# Patient Record
Sex: Male | Born: 1935
Health system: Southern US, Community
[De-identification: ages and names within clinical notes are randomized; demographics above are authoritative.]

## PROBLEM LIST (undated history)

## (undated) DIAGNOSIS — M1990 Unspecified osteoarthritis, unspecified site: Secondary | ICD-10-CM

## (undated) DIAGNOSIS — J189 Pneumonia, unspecified organism: Secondary | ICD-10-CM

## (undated) DIAGNOSIS — F0390 Unspecified dementia without behavioral disturbance: Secondary | ICD-10-CM

## (undated) DIAGNOSIS — I639 Cerebral infarction, unspecified: Secondary | ICD-10-CM

## (undated) DIAGNOSIS — I499 Cardiac arrhythmia, unspecified: Secondary | ICD-10-CM

## (undated) DIAGNOSIS — R569 Unspecified convulsions: Secondary | ICD-10-CM

## (undated) DIAGNOSIS — I82409 Acute embolism and thrombosis of unspecified deep veins of unspecified lower extremity: Secondary | ICD-10-CM

## (undated) DIAGNOSIS — I2510 Atherosclerotic heart disease of native coronary artery without angina pectoris: Secondary | ICD-10-CM

## (undated) DIAGNOSIS — F1011 Alcohol abuse, in remission: Secondary | ICD-10-CM

## (undated) DIAGNOSIS — E7800 Pure hypercholesterolemia, unspecified: Secondary | ICD-10-CM

## (undated) DIAGNOSIS — A09 Infectious gastroenteritis and colitis, unspecified: Secondary | ICD-10-CM

## (undated) DIAGNOSIS — N390 Urinary tract infection, site not specified: Secondary | ICD-10-CM

## (undated) HISTORY — PX: OTHER SURGICAL HISTORY: SHX169

## (undated) HISTORY — DX: Urinary tract infection, site not specified: N39.0

## (undated) HISTORY — DX: Unspecified osteoarthritis, unspecified site: M19.90

## (undated) HISTORY — PX: ROTATOR CUFF REPAIR: SHX139

## (undated) HISTORY — DX: Atherosclerotic heart disease of native coronary artery without angina pectoris: I25.10

## (undated) HISTORY — PX: JOINT REPLACEMENT: SHX530

## (undated) HISTORY — DX: Alcohol abuse, in remission: F10.11

## (undated) HISTORY — DX: Cerebral infarction, unspecified: I63.9

---

## 2002-05-03 ENCOUNTER — Encounter: Payer: Self-pay | Admitting: Internal Medicine

## 2002-05-03 ENCOUNTER — Encounter: Admission: RE | Admit: 2002-05-03 | Discharge: 2002-05-03 | Payer: Self-pay | Admitting: Internal Medicine

## 2002-06-28 ENCOUNTER — Encounter: Payer: Self-pay | Admitting: Neurosurgery

## 2002-06-28 ENCOUNTER — Ambulatory Visit (HOSPITAL_COMMUNITY): Admission: RE | Admit: 2002-06-28 | Discharge: 2002-06-28 | Payer: Self-pay | Admitting: Internal Medicine

## 2002-06-28 ENCOUNTER — Encounter: Admission: RE | Admit: 2002-06-28 | Discharge: 2002-06-28 | Payer: Self-pay | Admitting: Neurosurgery

## 2002-07-12 ENCOUNTER — Encounter: Admission: RE | Admit: 2002-07-12 | Discharge: 2002-07-12 | Payer: Self-pay | Admitting: Neurosurgery

## 2002-07-12 ENCOUNTER — Encounter: Payer: Self-pay | Admitting: Neurosurgery

## 2002-07-12 ENCOUNTER — Ambulatory Visit (HOSPITAL_COMMUNITY): Admission: RE | Admit: 2002-07-12 | Discharge: 2002-07-12 | Payer: Self-pay | Admitting: Neurosurgery

## 2002-08-02 ENCOUNTER — Encounter: Payer: Self-pay | Admitting: Neurosurgery

## 2002-08-02 ENCOUNTER — Encounter: Admission: RE | Admit: 2002-08-02 | Discharge: 2002-08-02 | Payer: Self-pay | Admitting: Neurosurgery

## 2002-08-02 ENCOUNTER — Ambulatory Visit (HOSPITAL_COMMUNITY): Admission: RE | Admit: 2002-08-02 | Discharge: 2002-08-02 | Payer: Self-pay | Admitting: Neurosurgery

## 2003-08-22 ENCOUNTER — Inpatient Hospital Stay (HOSPITAL_BASED_OUTPATIENT_CLINIC_OR_DEPARTMENT_OTHER): Admission: RE | Admit: 2003-08-22 | Discharge: 2003-08-22 | Payer: Self-pay | Admitting: Cardiovascular Disease

## 2004-07-22 ENCOUNTER — Encounter: Admission: RE | Admit: 2004-07-22 | Discharge: 2004-07-22 | Payer: Self-pay | Admitting: Gastroenterology

## 2004-08-05 ENCOUNTER — Ambulatory Visit (HOSPITAL_COMMUNITY): Admission: RE | Admit: 2004-08-05 | Discharge: 2004-08-05 | Payer: Self-pay | Admitting: Gastroenterology

## 2004-08-05 ENCOUNTER — Encounter (INDEPENDENT_AMBULATORY_CARE_PROVIDER_SITE_OTHER): Payer: Self-pay | Admitting: *Deleted

## 2006-05-15 ENCOUNTER — Encounter: Admission: RE | Admit: 2006-05-15 | Discharge: 2006-05-15 | Payer: Self-pay | Admitting: Internal Medicine

## 2006-11-28 ENCOUNTER — Encounter: Admission: RE | Admit: 2006-11-28 | Discharge: 2006-11-28 | Payer: Self-pay | Admitting: Internal Medicine

## 2007-03-01 ENCOUNTER — Ambulatory Visit (HOSPITAL_COMMUNITY): Admission: RE | Admit: 2007-03-01 | Discharge: 2007-03-01 | Payer: Self-pay | Admitting: Nephrology

## 2007-03-23 ENCOUNTER — Observation Stay (HOSPITAL_COMMUNITY): Admission: RE | Admit: 2007-03-23 | Discharge: 2007-03-24 | Payer: Self-pay | Admitting: Nephrology

## 2007-03-23 ENCOUNTER — Encounter (INDEPENDENT_AMBULATORY_CARE_PROVIDER_SITE_OTHER): Payer: Self-pay | Admitting: Nephrology

## 2007-05-04 ENCOUNTER — Encounter: Admission: RE | Admit: 2007-05-04 | Discharge: 2007-05-04 | Payer: Self-pay | Admitting: Nephrology

## 2007-05-10 ENCOUNTER — Encounter: Admission: RE | Admit: 2007-05-10 | Discharge: 2007-05-10 | Payer: Self-pay | Admitting: Nephrology

## 2007-09-17 ENCOUNTER — Ambulatory Visit: Payer: Self-pay | Admitting: Gastroenterology

## 2007-10-29 ENCOUNTER — Telehealth (INDEPENDENT_AMBULATORY_CARE_PROVIDER_SITE_OTHER): Payer: Self-pay | Admitting: *Deleted

## 2007-10-30 ENCOUNTER — Encounter: Payer: Self-pay | Admitting: Gastroenterology

## 2007-10-30 ENCOUNTER — Ambulatory Visit: Payer: Self-pay | Admitting: Gastroenterology

## 2007-11-01 ENCOUNTER — Encounter: Payer: Self-pay | Admitting: Gastroenterology

## 2008-05-28 ENCOUNTER — Encounter: Admission: RE | Admit: 2008-05-28 | Discharge: 2008-05-28 | Payer: Self-pay | Admitting: Internal Medicine

## 2009-03-25 ENCOUNTER — Encounter: Admission: RE | Admit: 2009-03-25 | Discharge: 2009-03-25 | Payer: Self-pay | Admitting: Internal Medicine

## 2009-04-07 ENCOUNTER — Inpatient Hospital Stay (HOSPITAL_COMMUNITY): Admission: EM | Admit: 2009-04-07 | Discharge: 2009-04-11 | Payer: Self-pay | Admitting: Emergency Medicine

## 2009-04-08 ENCOUNTER — Encounter (INDEPENDENT_AMBULATORY_CARE_PROVIDER_SITE_OTHER): Payer: Self-pay | Admitting: *Deleted

## 2009-04-08 ENCOUNTER — Ambulatory Visit: Payer: Self-pay | Admitting: Vascular Surgery

## 2009-09-16 ENCOUNTER — Encounter: Admission: RE | Admit: 2009-09-16 | Discharge: 2009-09-16 | Payer: Self-pay | Admitting: Internal Medicine

## 2010-06-27 LAB — CBC
HCT: 38 % — ABNORMAL LOW (ref 39.0–52.0)
Hemoglobin: 12.8 g/dL — ABNORMAL LOW (ref 13.0–17.0)
MCHC: 33.8 g/dL (ref 30.0–36.0)
MCV: 90.9 fL (ref 78.0–100.0)
Platelets: 182 10*3/uL (ref 150–400)
RBC: 4.18 MIL/uL — ABNORMAL LOW (ref 4.22–5.81)
RDW: 13.8 % (ref 11.5–15.5)
WBC: 7.7 10*3/uL (ref 4.0–10.5)

## 2010-06-27 LAB — GLUCOSE, CAPILLARY: Glucose-Capillary: 103 mg/dL — ABNORMAL HIGH (ref 70–99)

## 2010-06-27 LAB — BASIC METABOLIC PANEL WITH GFR
BUN: 6 mg/dL (ref 6–23)
CO2: 26 meq/L (ref 19–32)
Calcium: 8.9 mg/dL (ref 8.4–10.5)
Chloride: 100 meq/L (ref 96–112)
Creatinine, Ser: 0.74 mg/dL (ref 0.4–1.5)
GFR calc non Af Amer: >60 mL/min
Glucose, Bld: 108 mg/dL — ABNORMAL HIGH (ref 70–99)
Potassium: 3 meq/L — ABNORMAL LOW (ref 3.5–5.1)
Sodium: 134 meq/L — ABNORMAL LOW (ref 135–145)

## 2010-07-12 LAB — COMPREHENSIVE METABOLIC PANEL
ALT: 24 U/L (ref 0–53)
AST: UNDETERMINED U/L (ref 0–37)
Albumin: 4.3 g/dL (ref 3.5–5.2)
Creatinine, Ser: 1.03 mg/dL (ref 0.4–1.5)
Glucose, Bld: UNDETERMINED mg/dL (ref 70–99)
Sodium: 132 mEq/L — ABNORMAL LOW (ref 135–145)
Total Bilirubin: UNDETERMINED mg/dL (ref 0.3–1.2)
Total Protein: 8.3 g/dL (ref 6.0–8.3)

## 2010-07-12 LAB — TESTOSTERONE: Testosterone: 54.39 ng/dL — ABNORMAL LOW (ref 350–890)

## 2010-07-12 LAB — GLUCOSE, CAPILLARY
Glucose-Capillary: 107 mg/dL — ABNORMAL HIGH (ref 70–99)
Glucose-Capillary: 109 mg/dL — ABNORMAL HIGH (ref 70–99)
Glucose-Capillary: 114 mg/dL — ABNORMAL HIGH (ref 70–99)
Glucose-Capillary: 67 mg/dL — ABNORMAL LOW (ref 70–99)
Glucose-Capillary: 69 mg/dL — ABNORMAL LOW (ref 70–99)
Glucose-Capillary: 76 mg/dL (ref 70–99)
Glucose-Capillary: 88 mg/dL (ref 70–99)
Glucose-Capillary: 89 mg/dL (ref 70–99)
Glucose-Capillary: 90 mg/dL (ref 70–99)
Glucose-Capillary: 92 mg/dL (ref 70–99)
Glucose-Capillary: 99 mg/dL (ref 70–99)

## 2010-07-12 LAB — CBC
HCT: 35.6 % — ABNORMAL LOW (ref 39.0–52.0)
Platelets: 162 10*3/uL (ref 150–400)
RDW: 14.2 % (ref 11.5–15.5)
WBC: 8.3 10*3/uL (ref 4.0–10.5)

## 2010-07-12 LAB — URINALYSIS, ROUTINE W REFLEX MICROSCOPIC
Bilirubin Urine: NEGATIVE
Ketones, ur: NEGATIVE mg/dL
Nitrite: NEGATIVE
Protein, ur: NEGATIVE mg/dL
Urobilinogen, UA: 0.2 mg/dL (ref 0.0–1.0)

## 2010-07-12 LAB — CK TOTAL AND CKMB (NOT AT ARMC)
CK, MB: 2.4 ng/mL (ref 0.3–4.0)
CK, MB: UNDETERMINED ng/mL (ref 0.3–4.0)
Relative Index: 1.9 (ref 0.0–2.5)
Relative Index: INVALID (ref 0.0–2.5)
Total CK: 124 U/L (ref 7–232)

## 2010-07-12 LAB — BASIC METABOLIC PANEL
BUN: 10 mg/dL (ref 6–23)
CO2: 23 mEq/L (ref 19–32)
Calcium: 8.9 mg/dL (ref 8.4–10.5)
Calcium: 8.9 mg/dL (ref 8.4–10.5)
Creatinine, Ser: 0.66 mg/dL (ref 0.4–1.5)
Creatinine, Ser: 0.82 mg/dL (ref 0.4–1.5)
GFR calc Af Amer: >60 mL/min (ref >60)
GFR calc non Af Amer: >60 mL/min (ref >60)
GFR calc non Af Amer: >60 mL/min (ref >60)
Glucose, Bld: 102 mg/dL — ABNORMAL HIGH (ref 70–99)
Glucose, Bld: 113 mg/dL — ABNORMAL HIGH (ref 70–99)
Potassium: 3.6 mEq/L (ref 3.5–5.1)

## 2010-07-12 LAB — URINE CULTURE

## 2010-07-12 LAB — POCT CARDIAC MARKERS
CKMB, poc: 1.5 ng/mL (ref 1.0–8.0)
Troponin i, poc: <0.05 ng/mL (ref 0.00–0.09)

## 2010-07-12 LAB — VITAMIN B12
Vitamin B-12: 233 pg/mL (ref 211–911)
Vitamin B-12: 264 pg/mL (ref 211–911)

## 2010-07-12 LAB — HEPATIC FUNCTION PANEL
Bilirubin, Direct: 0.2 mg/dL (ref 0.0–0.3)
Total Bilirubin: 0.3 mg/dL (ref 0.3–1.2)

## 2010-07-12 LAB — TSH: TSH: 1.709 u[IU]/mL (ref 0.350–4.500)

## 2010-07-12 LAB — GLUCOSE, RANDOM: Glucose, Bld: 92 mg/dL (ref 70–99)

## 2010-07-12 LAB — TESTOSTERONE, FREE: Testosterone, Free: 8.7 pg/mL — ABNORMAL LOW (ref 47.0–244.0)

## 2010-07-12 LAB — AST: AST: 26 U/L (ref 0–37)

## 2010-07-12 LAB — TESTOSTERONE, % FREE: Testosterone-% Free: 1.6 % — ABNORMAL HIGH (ref 1.6–2.9)

## 2010-07-12 LAB — HIV ANTIBODY (ROUTINE TESTING W REFLEX): HIV: NONREACTIVE

## 2010-07-12 LAB — PSA: PSA: 0.97 ng/mL (ref 0.10–4.00)

## 2010-08-27 NOTE — Cardiovascular Report (Signed)
Raymond Rangel                            ACCOUNT NO.:  [id number]   MEDICAL RECORD NO.:  [id number]                   PATIENT TYPE:  OIB   LOCATION:  6501                                 FACILITY:  [facility]   PHYSICIAN:  Vesta Mixer, M.D.              DATE OF BIRTH:  Dec 13, 1931   DATE OF PROCEDURE:  08/22/2003  DATE OF DISCHARGE:  08/22/2003                              CARDIAC CATHETERIZATION   PROCEDURES PERFORMED:   CARDIOLOGIST:  Vesta Mixer, M.D.   INDICATIONS FOR PROCEDURE:  Raymond Rangel is a 77 year old gentleman with a  history of an abnormal Cardiolite study in the past.  He was seen originally  two years ago and had an abnormal Cardiolite study.  He did not want to have  a heart catheterization at that time.  He did not return for follow up after  that, but returned about a month ago wanting to have further evaluation of  his coronary arteries.  We repeated the Cardiolite study, which revealed a  fixed inferior wall defect.  He was scheduled for a heart catheterization  for further evaluation.   DESCRIPTION OF PROCEDURE:  The right femoral artery was easily cannulated  using the modified Seldinger technique.   HEMODYNAMIC DATA:  The left ventricular pressure was 132/14 with an aortic  pressure of 131/80.   ANGIOGRAPHIC DATA:  Left Main:  The left main is moderately  calcified.  There is a 20-30% stenosis at the origin of the left main, but it does not  appear to be flow-limiting.   Left Anterior Descending Artery:  The left anterior descending artery is  mildly-to-moderately calcified. There are mild diffuse irregularities  throughout that course the left anterior descending artery, but no discrete  stenosis.  The first diagonal branch is a fairly small vessel and has a 60-  70% stenosis at its origin.   Circumflex Artery:  The left circumflex artery is mildly-to-moderately  calcified  in the proximal segment.  There are minor luminal irregularities  in  the circumflex vessel, but no discrete stenosis.   Right Coronary Artery:  The right coronary artery is somewhat of a moderate-  sized vessel.  There is a long severely diffused segment extending from the  proximal and all the way through the midvessel.  This stenosis ranges in  severity between 70 and 85%.  It is fairly long and very eccentric.  The RCA  then has mild diffuse irregularities between 20 and 30% extending all the  way to the distal RCA.  The posterior descending artery and the  posterolateral artery are free of significant disease.   VENTRICULOGRAPHIC DATA:  Left Ventriculogram:  The left ventriculogram was  performed in the 30 RAO position.  It revealed well-preserved left  ventricular systolic function.  There is moderate-to-severe hypokinesis of  the inferior apex.  The inferobasal seems to contract fairly well.  Ejection  fraction is  approximately 50%.  There is mild mitral regurgitation.   COMPLICATIONS:  None.   CONCLUSION:  Significant disease involving the right coronary artery.   This vessel is rather small and it is probably between 2 and 2.5 mm in  diameter.  It would require a very long fitted segment.  It is at some  increased risk of restenosis.  His left ventricular systolic function is at  the lower limits of normal.  There are segmental wall motion abnormalities  consistent with a previous inferoapical myocardial infarction.   We will continue with medical therapy and we will consider angioplasty if  the patient develops symptoms.                                               Vesta Mixer, M.D.    PJN/MEDQ  D:  08/22/2003  T:  08/22/2003  Job:  [id number]   cc:   Antony Madura, M.D.  [address], [address]  [place]  [place] [address]  Fax: [phone]

## 2010-08-27 NOTE — Op Note (Signed)
NAMEJERREN, Raymond Rangel                ACCOUNT NO.:  [id number]   MEDICAL RECORD NO.:  [id number]          PATIENT TYPE:  AMB   LOCATION:  ENDO                         FACILITY:  [facility]   PHYSICIAN:  Graylin Shiver, M.D.   DATE OF BIRTH:  1931/11/04   DATE OF PROCEDURE:  08/05/2004  DATE OF DISCHARGE:                                 OPERATIVE REPORT   PROCEDURE:  Colonoscopy with biopsy.   INDICATIONS FOR PROCEDURE:  Screening.   Informed consent was obtained after explanation of the risks of bleeding,  infection and perforation.   PREMEDICATION:  Fentanyl 60 mcg IV, Versed 6 mg IV.   DESCRIPTION OF PROCEDURE:  With the patient in the left lateral decubitus  position, a rectal exam was performed, no masses were felt. The Olympus  colonoscope was inserted into the rectum and advanced around the colon to  the cecum. Cecal landmarks were identified. The cecum and ascending colon  were normal. The transverse colon was normal. The descending colon and  sigmoid revealed some scattered diverticula. In the proximal rectum on a  fold, there was some reddish mucosa. I am not sure of the significance of  this but some biopsies were obtained for histological inspection. He  tolerated the procedure well without complications.   IMPRESSION:  1.  Diverticulosis.  2.  Reddish mucosa in the rectum on a fold, biopsies were obtained.   PLAN:  The biopsies will be checked.      SFG/MEDQ  D:  08/05/2004  T:  08/05/2004  Job:  [id number]   cc:   Antony Madura, M.D.  [address]  [place]  [place] [address]  Fax: [phone]

## 2010-08-27 NOTE — H&P (Signed)
NAME:  Raymond Rangel, Raymond Rangel NO.:  [id number]   MEDICAL RECORD NO.:  [id number]                   PATIENT TYPE:  AMB   LOCATION:                                       FACILITY:  [facility]   PHYSICIAN:  Vesta Mixer, M.D.              DATE OF BIRTH:  1932/03/13   DATE OF ADMISSION:  08/22/2003  DATE OF DISCHARGE:                                HISTORY & PHYSICAL   HISTORY OF PRESENT ILLNESS:  Raymond Rangel is an elderly gentleman with a  history of an abnormal Cardiolite study 2 years ago.  He was recently seen  for followup of that study.  He had a repeat Cardiolite study which once  again revealed some abnormalities.  He is now scheduled for an outpatient  heart catheterization.   Raymond Rangel has been relatively asymptomatic.  He has not had any episodes of  chest pain or shortness of breath.  He has been able to do all of his normal  activities without any significant problems.   CURRENT MEDICATIONS:  1. Maxzide 75/50 mg once a day.  2. Halcion once a day.  3. Ativan once a day.  4. Lipitor 10 mg a day.   ALLERGIES:  None.   PAST MEDICAL HISTORY:  1. Hyperlipidemia.  2. Hypertension.   SOCIAL HISTORY:  The patient is a nonsmoker and nondrinker.   FAMILY HISTORY:  His father died at age 16 due to old age.  His mother  died at age 60 due to a myocardial infarction.   REVIEW OF SYSTEMS:  His review of systems was reviewed and is essentially  negative.   PHYSICAL EXAMINATION:  GENERAL:  On exam, he is an elderly gentleman in no  acute distress.  He is alert and oriented x3 and his mood and affect are  normal.  His weight is 157.  VITAL SIGNS:  His blood pressure is 110/70 with a heart rate of 100.  HEENT/NECK:  His HEENT exam reveals 2+ carotids.  He has no bruits.  There  is no JVD and no thyromegaly.  LUNGS:  Lungs are clear to auscultation.  HEART:  Regular rate.  S1 and S2 with no murmurs, gallops or rubs.  ABDOMEN:  Exam reveals good bowel sounds  and is nontender.  EXTREMITIES:  He has no clubbing, cyanosis or edema.  NEUROLOGIC:  Exam was nonfocal.   ASSESSMENT AND PLAN:  Raymond Rangel presents with an abnormal Cardiolite study.  He has a fixed inferior wall scar which appears to be consistent with an  inferior wall myocardial infarction.  I have recommended that we proceed  with a heart catheterization to evaluate him for the possibility of coronary  artery disease.  We have discussed the risks, benefits and options of heart  catheterization.  He understands and agrees to proceed.  Vesta Mixer, M.D.    PJN/MEDQ  D:  08/18/2003  T:  08/18/2003  Job:  [id number]   cc:   Antony Madura, M.D.  [address]  [place]  [place] [address]  Fax: [phone]

## 2010-10-25 ENCOUNTER — Emergency Department (HOSPITAL_COMMUNITY): Payer: Medicare Other

## 2010-10-25 ENCOUNTER — Inpatient Hospital Stay (HOSPITAL_COMMUNITY)
Admission: EM | Admit: 2010-10-25 | Discharge: 2010-10-30 | DRG: 872 | Disposition: A | Discharge status: Home / Self Care | Payer: Medicare Other | Attending: Family Medicine | Admitting: Family Medicine

## 2010-10-25 DIAGNOSIS — I2510 Atherosclerotic heart disease of native coronary artery without angina pectoris: Secondary | ICD-10-CM | POA: Diagnosis present

## 2010-10-25 DIAGNOSIS — E876 Hypokalemia: Secondary | ICD-10-CM | POA: Diagnosis present

## 2010-10-25 DIAGNOSIS — Z8673 Personal history of transient ischemic attack (TIA), and cerebral infarction without residual deficits: Secondary | ICD-10-CM

## 2010-10-25 DIAGNOSIS — A498 Other bacterial infections of unspecified site: Secondary | ICD-10-CM | POA: Diagnosis present

## 2010-10-25 DIAGNOSIS — R000 Tachycardia, unspecified: Secondary | ICD-10-CM | POA: Diagnosis present

## 2010-10-25 DIAGNOSIS — R079 Chest pain, unspecified: Secondary | ICD-10-CM | POA: Diagnosis present

## 2010-10-25 DIAGNOSIS — F0390 Unspecified dementia without behavioral disturbance: Secondary | ICD-10-CM | POA: Diagnosis present

## 2010-10-25 DIAGNOSIS — E119 Type 2 diabetes mellitus without complications: Secondary | ICD-10-CM | POA: Diagnosis present

## 2010-10-25 DIAGNOSIS — K219 Gastro-esophageal reflux disease without esophagitis: Secondary | ICD-10-CM | POA: Diagnosis present

## 2010-10-25 DIAGNOSIS — E871 Hypo-osmolality and hyponatremia: Secondary | ICD-10-CM | POA: Diagnosis present

## 2010-10-25 DIAGNOSIS — I6789 Other cerebrovascular disease: Secondary | ICD-10-CM | POA: Diagnosis present

## 2010-10-25 DIAGNOSIS — I10 Essential (primary) hypertension: Secondary | ICD-10-CM | POA: Diagnosis present

## 2010-10-25 DIAGNOSIS — Z7982 Long term (current) use of aspirin: Secondary | ICD-10-CM

## 2010-10-25 DIAGNOSIS — R7881 Bacteremia: Principal | ICD-10-CM | POA: Diagnosis present

## 2010-10-25 LAB — URINE MICROSCOPIC-ADD ON

## 2010-10-25 LAB — DIFFERENTIAL
Basophils Absolute: 0 10*3/uL (ref 0.0–0.1)
Basophils Relative: 0 % (ref 0–1)
Lymphocytes Relative: 2 % — ABNORMAL LOW (ref 12–46)
Neutro Abs: 17.9 10*3/uL — ABNORMAL HIGH (ref 1.7–7.7)
Neutrophils Relative %: 95 % — ABNORMAL HIGH (ref 43–77)

## 2010-10-25 LAB — URINALYSIS, ROUTINE W REFLEX MICROSCOPIC
Hgb urine dipstick: NEGATIVE
Ketones, ur: NEGATIVE mg/dL
Nitrite: NEGATIVE
pH: 5.5 (ref 5.0–8.0)

## 2010-10-25 LAB — PROTIME-INR
INR: 1.18 (ref 0.00–1.49)
Prothrombin Time: 15.3 seconds — ABNORMAL HIGH (ref 11.6–15.2)

## 2010-10-25 LAB — CBC
HCT: 42.6 % (ref 39.0–52.0)
Hemoglobin: 14.9 g/dL (ref 13.0–17.0)
RBC: 5 MIL/uL (ref 4.22–5.81)
WBC: 18.8 10*3/uL — ABNORMAL HIGH (ref 4.0–10.5)

## 2010-10-25 LAB — CK TOTAL AND CKMB (NOT AT ARMC)
CK, MB: 2.2 ng/mL (ref 0.3–4.0)
Relative Index: 1.5 (ref 0.0–2.5)

## 2010-10-25 LAB — BASIC METABOLIC PANEL
BUN: 15 mg/dL (ref 6–23)
Creatinine, Ser: 1.12 mg/dL (ref 0.50–1.35)
GFR calc Af Amer: >60 mL/min (ref >60)
GFR calc non Af Amer: >60 mL/min (ref >60)
Glucose, Bld: 120 mg/dL — ABNORMAL HIGH (ref 70–99)

## 2010-10-25 LAB — APTT: aPTT: 29 seconds (ref 24–37)

## 2010-10-26 LAB — BASIC METABOLIC PANEL
BUN: 13 mg/dL (ref 6–23)
Chloride: 98 mEq/L (ref 96–112)
GFR calc Af Amer: >60 mL/min (ref >60)
GFR calc non Af Amer: >60 mL/min (ref >60)
Potassium: 3.9 mEq/L (ref 3.5–5.1)
Sodium: 134 mEq/L — ABNORMAL LOW (ref 135–145)

## 2010-10-26 LAB — TROPONIN I
Troponin I: <0.3 ng/mL (ref <0.30)
Troponin I: <0.3 ng/mL (ref <0.30)
Troponin I: <0.3 ng/mL (ref <0.30)

## 2010-10-27 LAB — BASIC METABOLIC PANEL
BUN: 11 mg/dL (ref 6–23)
Chloride: 96 mEq/L (ref 96–112)
GFR calc Af Amer: >60 mL/min (ref >60)
GFR calc non Af Amer: >60 mL/min (ref >60)
Glucose, Bld: 106 mg/dL — ABNORMAL HIGH (ref 70–99)
Potassium: 3.8 mEq/L (ref 3.5–5.1)
Sodium: 130 mEq/L — ABNORMAL LOW (ref 135–145)

## 2010-10-27 LAB — CBC
HCT: 39.2 % (ref 39.0–52.0)
Hemoglobin: 13.5 g/dL (ref 13.0–17.0)
MCHC: 34.4 g/dL (ref 30.0–36.0)
WBC: 13.7 10*3/uL — ABNORMAL HIGH (ref 4.0–10.5)

## 2010-10-28 LAB — CULTURE, BLOOD (ROUTINE X 2): Culture  Setup Time: 201207170136

## 2010-10-28 LAB — CBC
HCT: 40.3 % (ref 39.0–52.0)
Hemoglobin: 14.2 g/dL (ref 13.0–17.0)
MCV: 82.9 fL (ref 78.0–100.0)
WBC: 9.2 10*3/uL (ref 4.0–10.5)

## 2010-10-28 LAB — DIFFERENTIAL
Basophils Absolute: 0.1 10*3/uL (ref 0.0–0.1)
Lymphocytes Relative: 13 % (ref 12–46)
Lymphs Abs: 1.2 10*3/uL (ref 0.7–4.0)
Neutro Abs: 6.7 10*3/uL (ref 1.7–7.7)

## 2010-10-28 LAB — COMPREHENSIVE METABOLIC PANEL
Alkaline Phosphatase: 141 U/L — ABNORMAL HIGH (ref 39–117)
BUN: 14 mg/dL (ref 6–23)
CO2: 23 mEq/L (ref 19–32)
Chloride: 95 mEq/L — ABNORMAL LOW (ref 96–112)
Creatinine, Ser: 0.88 mg/dL (ref 0.50–1.35)
GFR calc Af Amer: >60 mL/min (ref >60)
GFR calc non Af Amer: >60 mL/min (ref >60)
Glucose, Bld: 109 mg/dL — ABNORMAL HIGH (ref 70–99)
Potassium: 3.6 mEq/L (ref 3.5–5.1)
Total Bilirubin: 0.3 mg/dL (ref 0.3–1.2)

## 2010-10-28 NOTE — H&P (Signed)
Raymond Rangel, Raymond Rangel                ACCOUNT NO.:  [id number]  MEDICAL RECORD NO.:  [id number]  LOCATION:  2028                         FACILITY:  [facility]  PHYSICIAN:  Candelaria Celeste, DO      DATE OF BIRTH:  03/25/1932  DATE OF ADMISSION:  10/25/2010 DATE OF DISCHARGE:                             HISTORY & PHYSICAL   PRIMARY CARE PROVIDER:  Dr. Su Hilt.  CHIEF COMPLAINT:  Chest pain and fever.  HISTORY OF PRESENT ILLNESS:  The patient is a 77 year old male with a history of coronary artery disease, dementia, mild CVA, borderline type 2 diabetes, hypertension who presents with 1 day history of chest pain and fever that started earlier today.  The fever happened when he was out shopping when he also had chest pain, shortness of breath, and diaphoresis along with the episode of fever.  The patient presents to his primary care provider's office who sent the patient to the emergency department.  Since arrival in the emergency room, the patient denies chest pain.  He is not able to describe the chest pain secondary to the dementia.  He currently denies nausea, vomiting, diarrhea, dysuria, or sick contacts.  He does admit to having a dry cough for 2 days, though denies sputum production or wheezing.  He also admits to mild sore throat and does have intermittent headache without photophobia.  CARDIAC RISK FACTORS: 1. Diabetes. 2. History of coronary artery disease. 3. History of early MI in primary family members.  PAST MEDICAL HISTORY: 1. Borderline diabetes type 2. 2. Hypertension. 3. Hyperlipidemia. 4. Dementia. 5. Coronary artery disease. 6. Mild CVA.  PAST SURGICAL HISTORY: 1. Status post cardiac stents, unknown placement. 2. Rotator cuff repair bilaterally.  MEDICATIONS: 1. Aspirin 81 mg. 2. Simvastatin 40 mg. 3. Hydrochlorothiazide 25 mg daily 4. Multivitamin. 5. Triazolam 0.25 mg at night. 6. Omeprazole 20 mg daily. 7. Vicodin 10/660 one tablet b.i.d.  FAMILY  HISTORY:  There is a family history of early MI in three of his brothers.  His mother also had an MI.  His mother also had diabetes type 2.  SOCIAL HISTORY:  The patient lives with his wife, daughter, and grandson.  He is retired from working in [employer], [employer].  He has never smoked.  He does not drink and does not use illicit drugs.  REVIEW OF SYSTEMS:  A full 10 system review of systems performed which was as stated in the HPI, otherwise negative.  PHYSICAL EXAMINATION:  VITAL SIGNS:  Temperature is 98.5, heart rate is 114, blood pressure is 120/76, respiratory rate 18, and oxygen saturation 99% on room air. GENERAL:  This is an elderly [place] male who is awake, alert, and oriented x3 in no acute distress. HEENT:  Head is normocephalic and atraumatic.  Pupils are equal, round, and react to light.  Extraocular muscles are intact.  Sclerae are anicteric and noninjected.  The tympanic membranes are translucent with good light, nonbulging, and nonreactive.  The posterior oropharynx is nonerythematous with not enlarged tonsils. NECK:  Supple without lymphadenopathy. LUNGS:  Clear to auscultation bilaterally with no wheezes, rales, or rhonchi. CARDIAC:  Regular rate and rhythm.  Normal S1-S2 sounds with no  murmurs auscultated. ABDOMEN:  Soft, nontender, and nondistended with appropriate bowel sounds.  No hepatosplenomegaly. VASCULAR:  The skin is warm to touch with 2+ dorsalis pedis and radial pulses with brisk capillary refill. NEUROLOGIC:  Sensation is intact to gross light touch.  Cranial nerves II-XII are grossly intact.  Strength is 5/5 in upper and lower extremities bilaterally with no focal deficits observed.  DTRs were not performed due to patient's resistance. MUSCULOSKELETAL:  There are no enlarged or erythematous or tender joints.  LABORATORY DATA: 1. A BMP was performed which showed a sodium of 138, potassium of 2.9,     chloride of 99, bicarb of 24, BUN of 15,  creatinine of 1.12, and     glucose of 120. 2. A CBC was drawn which showed a white count of 18.8, hemoglobin of     14.9, hematocrit of 42.6, and platelets of 122. 3. A troponin I was drawn which was less than 0.30.  CK was drawn     which was 144 and a CK-MB was 2.2. 4. Urinalysis showed no evidence of nitrates or leukocyte esterase. 5. A CT of the head showed no focal findings, mass or midline shift. 6. A chest x-ray was performed which showed no evidence of infiltrate.  IMPRESSION: 1. Fever, question etiology. 2. Chest pain. 3. Hypokalemia. 4. Borderline diabetes type 2. 5. Gastroesophageal reflux disease. 6. Coronary artery disease.  PLAN:  We will admit the patient to telemetry and continue the troponin series.  Due to the patient's fever, we will draw blood cultures x2 and continue to treat the patient's fever with Tylenol.  Due to the patient's fairly benign appearance, we will not start empiric antibiotics at this time but rather wait for results of blood cultures before starting this.  For the patient's hypokalemia, we will give the patient 20 mEq of potassium twice daily and recheck a BMP in the morning.  We will also continue the patient's home medications.  TIME SPENT:  65 minutes were spent in for this admission.          ______________________________ Candelaria Celeste, DO     JS/MEDQ  D:  10/25/2010  T:  10/26/2010  Job:  [id number]  cc:   Dr. Su Hilt  Electronically Signed by Candelaria Celeste DO on 10/28/2010 09:35:50 PM

## 2010-10-29 LAB — BASIC METABOLIC PANEL
Calcium: 9.4 mg/dL (ref 8.4–10.5)
GFR calc Af Amer: >60 mL/min (ref >60)
GFR calc non Af Amer: >60 mL/min (ref >60)
Glucose, Bld: 105 mg/dL — ABNORMAL HIGH (ref 70–99)
Sodium: 133 mEq/L — ABNORMAL LOW (ref 135–145)

## 2010-10-29 LAB — CULTURE, BLOOD (ROUTINE X 2): Culture  Setup Time: 201207170136

## 2010-10-29 LAB — CBC
MCH: 29.5 pg (ref 26.0–34.0)
Platelets: 139 10*3/uL — ABNORMAL LOW (ref 150–400)
RBC: 5.05 MIL/uL (ref 4.22–5.81)
WBC: 7.8 10*3/uL (ref 4.0–10.5)

## 2010-11-01 NOTE — Discharge Summary (Signed)
Raymond Rangel, Raymond Rangel                ACCOUNT NO.:  [id number]  MEDICAL RECORD NO.:  [id number]  LOCATION:  5504                         FACILITY:  [facility]  PHYSICIAN:  Standley Dakins, MD   DATE OF BIRTH:  18-Oct-1931  DATE OF ADMISSION:  10/25/2010 DATE OF DISCHARGE:  10/30/2010                              DISCHARGE SUMMARY   PRIMARY CARE PHYSICIAN:  Dr. Su Hilt.  DISCHARGE DIAGNOSES: 1. Escherichia coli bacteremia. 2. Chest pain - resolved. 3. Hypokalemia - resolved. 4. Hyponatremia - resolved. 5. Hypertension. 6. Dementia. 7. Borderline diabetes mellitus type 2. 8. Cerebrovascular disease. 9. Coronary artery disease. 10.History of mild cerebrovascular accident. 11.Status post cardiac stent placements. 12.Rotator cuff repair bilateral.  DISCHARGE MEDICATIONS: 1. Bactrim DS tablets 1 p.o. b.i.d. for 10 days. 2. Florastor 250 mg. 3. Probiotic capsules 1 p.o. t.i.d. x10 days. 4. Aspirin 81 mg p.o. daily. 5. Ambien 5 mg 1 p.o. at bedtime p.r.n. 6. Prilosec 20 mg 1 p.o. daily. 7. Simvastatin 40 mg 1 p.o. daily. 8. Vitamin B12, 1 tablet by mouth daily. 9. Triazolam 0.25 mg 1 p.o. daily at bedtime as needed for insomnia. 10.The patient was given instructions to discontinue     hydrochlorothiazide 25 mg.  HOSPITAL COURSE:  Briefly this patient is a 77 year old male with coronary artery disease, dementia, and cerebrovascular disease who presented to the emergency department with chest pain and fever.  He was ruled out for myocardial injury with serial cardiac enzymes.  However, he was found to have an E. coli bacteremia that he was treated with IV ceftriaxone and will be discharged on Bactrim DS.  The sensitivities have been verified.  The patient improved considerably with IV antibiotic treatment and IV fluids.  His electrolyte abnormalities were corrected.    His potassium was replaced and he did have some hyponatremia thought to be secondary to the hydrochlorothiazide  which was discontinued.  After that medication was discontinued, his sodium improved and his blood pressure remained in control and did not require additional blood pressure medications at that time.  I recommend that he follow up with his primary care provider to have his blood pressure rechecked but in the hospital we monitored him closely and his blood pressure remained very well controlled within normal limits on no medications.    The patient was evaluated by Physical Therapy who recommended that no further acute needs were needed and no additional DME equipment needed.  The patient can be discharged home with his spouse who will be taking care of him.  The patient began requesting to go home.  He had been monitored and remained stable and therefore I felt that it was safe for him to go home and have followup on an outpatient basis.  DISCHARGE CONDITION:  Stable.  DISPOSITION:  The patient will be discharged home with his wife and grandson who will be caring for him.  ACTIVITY:  Ad lib.  DIET:  Cardiac diet recommended.Follow up with Dr. Su Hilt, his primary care physician in 1 week.  I would like for him to have his blood pressure rechecked at that time.  SPECIAL INSTRUCTIONS: 1. Return if symptoms recur, worsen, or new changes develop. 2. Have  blood pressure rechecked with primary care physician in 1     week. 3. Please take your antibiotics until completion. 4. Please take the probiotics prescribed until completed for full 10     days. 5. Fall precautions recommended. 6. Monitor blood glucose once every day to every other day and report     any high blood glucose readings to primary care physician.   The patient was evaluated on October 30, 2010.  PHYSICAL EXAMINATION:  GENERAL:  He was awake, alert in no distress, ambulating well without difficulties. VITAL SIGNS:  Reviewed.  Temperature 98.2, pulse 80, respirations 18, blood pressure 123/76, pulse ox 95% on room  air. GENERAL:  Well-developed, well-nourished male, awake, alert in no distress. HEENT:  Normocephalic, atraumatic.  Mucous membranes moist. NECK:  Supple.  No lymphadenopathy or JVD. LUNGS:  Bilateral breath sounds, clear to auscultation.  No crackles, wheezes, or rhonchi. CARDIAC:  Normal S1, S2 sounds without murmurs, rubs, or gallops. ABDOMEN:  Soft, nondistended, nontender, no masses palpated. EXTREMITIES:  No pretibial edema, cyanosis, or clubbing. NEUROLOGICAL:  No focal deficits.  LABORATORY DATA:  White blood cell count 7.8, hemoglobin 14.9, hematocrit 42.0, platelet count 139,000.  Sodium 133, potassium 3.7, chloride 97, CO2 24, BUN 14, creatinine 0.89, glucose 105, calcium 9.4.  IMPRESSION:  The patient was evaluated and determined to be stable for discharge home with close followup with his primary care physician as above.  I spent 38 minutes preparing this patient's discharge, reviewing medical records, reviewing laboratory data, and reconciling medications and arranging followup.     Standley Dakins, MD     CJ/MEDQ  D:  10/30/2010  T:  10/30/2010  Job:  [id number]  cc:   Dr. Su Hilt  Electronically Signed by Standley Dakins  on 11/01/2010 08:48:30 PM

## 2010-11-28 ENCOUNTER — Emergency Department (HOSPITAL_COMMUNITY)
Admission: EM | Admit: 2010-11-28 | Discharge: 2010-11-28 | Disposition: A | Discharge status: Home / Self Care | Payer: Medicare Other | Attending: Emergency Medicine | Admitting: Emergency Medicine

## 2010-11-28 DIAGNOSIS — E7800 Pure hypercholesterolemia, unspecified: Secondary | ICD-10-CM | POA: Insufficient documentation

## 2010-11-28 DIAGNOSIS — E119 Type 2 diabetes mellitus without complications: Secondary | ICD-10-CM | POA: Insufficient documentation

## 2010-11-28 DIAGNOSIS — T622X1A Toxic effect of other ingested (parts of) plant(s), accidental (unintentional), initial encounter: Secondary | ICD-10-CM | POA: Insufficient documentation

## 2010-11-28 DIAGNOSIS — L255 Unspecified contact dermatitis due to plants, except food: Secondary | ICD-10-CM | POA: Insufficient documentation

## 2010-11-28 DIAGNOSIS — I4891 Unspecified atrial fibrillation: Secondary | ICD-10-CM | POA: Insufficient documentation

## 2011-01-17 LAB — CROSSMATCH

## 2011-01-17 LAB — CBC
HCT: 34.3 — ABNORMAL LOW
HCT: 37.1 — ABNORMAL LOW
Hemoglobin: 14
MCV: 89.9
MCV: 91.3
Platelets: 228
RBC: 4.06 — ABNORMAL LOW
RDW: 14.5
RDW: 14.6
WBC: 13.6 — ABNORMAL HIGH
WBC: 7.3
WBC: 7.5

## 2011-01-17 LAB — DIFFERENTIAL
Basophils Relative: 1
Eosinophils Absolute: 0.6
Monocytes Absolute: 0.8
Monocytes Relative: 6
Neutrophils Relative %: 77

## 2011-01-17 LAB — COMPREHENSIVE METABOLIC PANEL
ALT: 19
Albumin: 2.4 — ABNORMAL LOW
Alkaline Phosphatase: 128 — ABNORMAL HIGH
Chloride: 105
Glucose, Bld: 93
Potassium: 4.5
Sodium: 139
Total Protein: 5.6 — ABNORMAL LOW

## 2011-01-17 LAB — BLEEDING TIME: Bleeding Time: 4.5

## 2011-01-18 LAB — DIFFERENTIAL
Eosinophils Absolute: 0.3
Lymphocytes Relative: 16
Lymphs Abs: 1.4
Monocytes Relative: 6
Neutro Abs: 6.4
Neutrophils Relative %: 73

## 2011-01-18 LAB — CROSSMATCH
ABO/RH(D): O POS
Antibody Screen: NEGATIVE

## 2011-01-18 LAB — ABO/RH: ABO/RH(D): O POS

## 2011-01-18 LAB — COMPREHENSIVE METABOLIC PANEL
ALT: 17
Calcium: 9
Creatinine, Ser: 0.9
Glucose, Bld: 98
Sodium: 140
Total Protein: 5.5 — ABNORMAL LOW

## 2011-01-18 LAB — APTT: aPTT: 28

## 2011-01-18 LAB — CBC
Hemoglobin: 14.1
MCHC: 33.9
RDW: 14.9

## 2011-01-18 LAB — PROTIME-INR: INR: 0.9

## 2011-01-25 ENCOUNTER — Inpatient Hospital Stay (INDEPENDENT_AMBULATORY_CARE_PROVIDER_SITE_OTHER)
Admission: RE | Admit: 2011-01-25 | Discharge: 2011-01-25 | Disposition: A | Discharge status: Home / Self Care | Payer: Medicare Other | Source: Ambulatory Visit | Attending: Family Medicine | Admitting: Family Medicine

## 2011-01-25 DIAGNOSIS — L509 Urticaria, unspecified: Secondary | ICD-10-CM

## 2011-01-25 DIAGNOSIS — L259 Unspecified contact dermatitis, unspecified cause: Secondary | ICD-10-CM

## 2011-03-20 ENCOUNTER — Emergency Department (HOSPITAL_COMMUNITY)
Admission: EM | Admit: 2011-03-20 | Discharge: 2011-03-20 | Disposition: A | Discharge status: Home / Self Care | Payer: Medicare Other | Source: Home / Self Care | Attending: Family Medicine | Admitting: Family Medicine

## 2011-03-20 ENCOUNTER — Encounter: Payer: Self-pay | Admitting: Cardiology

## 2011-03-20 DIAGNOSIS — L259 Unspecified contact dermatitis, unspecified cause: Secondary | ICD-10-CM

## 2011-03-20 MED ORDER — HYDROCORTISONE VALERATE 0.2 % EX OINT: TOPICAL_OINTMENT | Freq: Two times a day (BID) | CUTANEOUS | Status: DC

## 2011-03-20 NOTE — ED Notes (Addendum)
Wife at bedside reports pt to have facial swelling and rash off and on since August. Pt states it is itchy. Pt noted to have red cheeks. Denies lip swelling or difficulty breathing. Denies fever. Pt was expose poison ivy back in August. Pt also stated at different times his arm itch.

## 2011-03-20 NOTE — ED Provider Notes (Signed)
History     CSN: [id number] Arrival date & time: 03/20/2011  9:17 AM   First MD Initiated Contact with Patient 03/20/11 [phone]      Chief Complaint  Patient presents with  . Rash    (Consider location/radiation/quality/duration/timing/severity/associated sxs/prior treatment) Patient is a 77 y.o. male presenting with rash. The history is provided by the patient.  Rash  This is a recurrent problem. The current episode started more than 1 week ago. The problem has not changed since onset.There has been no fever.    Past Medical History  Diagnosis Date  . Mini stroke     Past Surgical History  Procedure Date  . Left arm     No family history on file.  History  Substance Use Topics  . Smoking status: Never Smoker   . Smokeless tobacco: Not on file  . Alcohol Use: No      Review of Systems  Constitutional: Negative.   Skin: Positive for rash.    Allergies  Review of patient's allergies indicates no known allergies.  Home Medications   Current Outpatient Rx  Name Route Sig Dispense Refill  . HYDROCORTISONE VALERATE 0.2 % EX OINT Topical Apply topically 2 (two) times daily. 45 g 1    BP 154/82  Pulse 79  Temp(Src) 95.8 F (35.4 C) (Oral)  Resp 18  Wt 135 lb (61.236 kg)  SpO2 98%  Physical Exam  Constitutional: He appears well-developed and well-nourished.  HENT:  Head: Normocephalic.  Skin: Skin is dry. Rash noted. There is erythema.    ED Course  Procedures (including critical care time)  Labs Reviewed - No data to display No results found.   1. Contact dermatitis       MDM          Barkley Bruns, MD 03/20/11 [phone]

## 2011-03-20 NOTE — ED Notes (Signed)
Unknown home medications. Wife trying to get in touch with someone who can state meds.

## 2011-04-12 DIAGNOSIS — I639 Cerebral infarction, unspecified: Secondary | ICD-10-CM

## 2011-04-12 HISTORY — DX: Cerebral infarction, unspecified: I63.9

## 2011-04-19 LAB — POCT INR
INR: 1.8
INR: 1.8

## 2011-04-20 ENCOUNTER — Encounter (HOSPITAL_COMMUNITY): Payer: Self-pay | Admitting: *Deleted

## 2011-04-20 ENCOUNTER — Emergency Department (HOSPITAL_COMMUNITY)
Admission: EM | Admit: 2011-04-20 | Discharge: 2011-04-21 | Disposition: A | Discharge status: Home / Self Care | Payer: Medicare Other | Attending: Emergency Medicine | Admitting: Emergency Medicine

## 2011-04-20 DIAGNOSIS — M7989 Other specified soft tissue disorders: Secondary | ICD-10-CM | POA: Insufficient documentation

## 2011-04-20 DIAGNOSIS — F0390 Unspecified dementia without behavioral disturbance: Secondary | ICD-10-CM | POA: Insufficient documentation

## 2011-04-20 DIAGNOSIS — R600 Localized edema: Secondary | ICD-10-CM

## 2011-04-20 DIAGNOSIS — Z79899 Other long term (current) drug therapy: Secondary | ICD-10-CM | POA: Insufficient documentation

## 2011-04-20 DIAGNOSIS — E875 Hyperkalemia: Secondary | ICD-10-CM | POA: Insufficient documentation

## 2011-04-20 DIAGNOSIS — R609 Edema, unspecified: Secondary | ICD-10-CM | POA: Insufficient documentation

## 2011-04-20 DIAGNOSIS — M79609 Pain in unspecified limb: Secondary | ICD-10-CM | POA: Insufficient documentation

## 2011-04-20 HISTORY — DX: Cardiac arrhythmia, unspecified: I49.9

## 2011-04-20 NOTE — ED Notes (Signed)
Per pt's family - pt w/ left arm pain that began yesterday evening - pt also w/ bilat leg pain and swelling. Pt denies any shortness of breath, diaphoresis, or n/v. Breath sounds clear and equal bilat.

## 2011-04-21 ENCOUNTER — Other Ambulatory Visit: Payer: Self-pay

## 2011-04-21 ENCOUNTER — Emergency Department (HOSPITAL_COMMUNITY): Payer: Medicare Other

## 2011-04-21 ENCOUNTER — Ambulatory Visit (HOSPITAL_COMMUNITY)
Admission: RE | Admit: 2011-04-21 | Discharge: 2011-04-21 | Disposition: A | Discharge status: Home / Self Care | Payer: Medicare Other | Source: Ambulatory Visit | Attending: Emergency Medicine | Admitting: Emergency Medicine

## 2011-04-21 ENCOUNTER — Encounter (HOSPITAL_COMMUNITY): Payer: Self-pay

## 2011-04-21 ENCOUNTER — Emergency Department (HOSPITAL_COMMUNITY)
Admission: EM | Admit: 2011-04-21 | Discharge: 2011-04-21 | Disposition: A | Discharge status: Home / Self Care | Payer: Medicare Other | Attending: Emergency Medicine | Admitting: Emergency Medicine

## 2011-04-21 DIAGNOSIS — M7989 Other specified soft tissue disorders: Secondary | ICD-10-CM | POA: Insufficient documentation

## 2011-04-21 DIAGNOSIS — I82409 Acute embolism and thrombosis of unspecified deep veins of unspecified lower extremity: Secondary | ICD-10-CM | POA: Insufficient documentation

## 2011-04-21 DIAGNOSIS — Z79899 Other long term (current) drug therapy: Secondary | ICD-10-CM | POA: Insufficient documentation

## 2011-04-21 DIAGNOSIS — M79609 Pain in unspecified limb: Secondary | ICD-10-CM | POA: Insufficient documentation

## 2011-04-21 DIAGNOSIS — R609 Edema, unspecified: Secondary | ICD-10-CM | POA: Insufficient documentation

## 2011-04-21 DIAGNOSIS — Z7982 Long term (current) use of aspirin: Secondary | ICD-10-CM | POA: Insufficient documentation

## 2011-04-21 LAB — BASIC METABOLIC PANEL
BUN: 12 mg/dL (ref 6–23)
CO2: 26 mEq/L (ref 19–32)
Chloride: 99 mEq/L (ref 96–112)
Chloride: 101 mEq/L (ref 96–112)
Creatinine, Ser: 0.82 mg/dL (ref 0.50–1.35)
GFR calc non Af Amer: 84 mL/min — ABNORMAL LOW (ref >90)
Glucose, Bld: 97 mg/dL (ref 70–99)
Glucose, Bld: 99 mg/dL (ref 70–99)
Potassium: 5.6 mEq/L — ABNORMAL HIGH (ref 3.5–5.1)
Potassium: 4.1 mEq/L (ref 3.5–5.1)
Sodium: 137 mEq/L (ref 135–145)

## 2011-04-21 LAB — DIFFERENTIAL
Eosinophils Absolute: 0.5 10*3/uL (ref 0.0–0.7)
Lymphocytes Relative: 18 % (ref 12–46)
Lymphs Abs: 1.9 10*3/uL (ref 0.7–4.0)
Neutro Abs: 7.1 10*3/uL (ref 1.7–7.7)
Neutrophils Relative %: 68 % (ref 43–77)

## 2011-04-21 LAB — CBC
HCT: 38.9 % — ABNORMAL LOW (ref 39.0–52.0)
Hemoglobin: 12.9 g/dL — ABNORMAL LOW (ref 13.0–17.0)
MCV: 86.4 fL (ref 78.0–100.0)
Platelets: 206 10*3/uL (ref 150–400)
RBC: 4.75 MIL/uL (ref 4.22–5.81)
WBC: 9.2 10*3/uL (ref 4.0–10.5)
WBC: 10.3 10*3/uL (ref 4.0–10.5)

## 2011-04-21 LAB — D-DIMER, QUANTITATIVE: D-Dimer, Quant: 1.73 ug/mL-FEU — ABNORMAL HIGH (ref 0.00–0.48)

## 2011-04-21 LAB — PROTIME-INR
INR: 1.13 (ref 0.00–1.49)
Prothrombin Time: 14.7 seconds (ref 11.6–15.2)

## 2011-04-21 LAB — APTT: aPTT: 31 seconds (ref 24–37)

## 2011-04-21 LAB — TROPONIN I: Troponin I: <0.3 ng/mL (ref <0.30)

## 2011-04-21 MED ORDER — SODIUM POLYSTYRENE SULFONATE 15 GM/60ML PO SUSP
15.0000 g | Freq: Once | ORAL | Status: AC
  Administered 2011-04-21: 15 g via ORAL
  Filled 2011-04-21: qty 60

## 2011-04-21 MED ORDER — ENOXAPARIN (LOVENOX) PATIENT EDUCATION KIT
PACK | Freq: Once | Status: AC
  Administered 2011-04-21: 17:00:00
  Filled 2011-04-21: qty 1

## 2011-04-21 MED ORDER — RIVAROXABAN 15 MG PO TABS
15.0000 mg | ORAL_TABLET | ORAL | Status: AC
  Administered 2011-04-21: 15 mg via ORAL
  Filled 2011-04-21: qty 1

## 2011-04-21 MED ORDER — ENOXAPARIN SODIUM 100 MG/ML ~~LOC~~ SOLN
1.0000 mg/kg | Freq: Once | SUBCUTANEOUS | Status: AC
  Administered 2011-04-21: 60 mg via SUBCUTANEOUS
  Filled 2011-04-21: qty 1

## 2011-04-21 MED ORDER — ENOXAPARIN SODIUM 150 MG/ML ~~LOC~~ SOLN: 1.5000 mg/kg | Freq: Every day | SUBCUTANEOUS | Status: DC

## 2011-04-21 MED ORDER — TRAMADOL HCL 50 MG PO TABS: 50.0000 mg | ORAL_TABLET | Freq: Four times a day (QID) | ORAL | Status: DC | PRN

## 2011-04-21 MED ORDER — WARFARIN SODIUM 5 MG PO TABS: 5.0000 mg | ORAL_TABLET | Freq: Every day | ORAL | Status: DC

## 2011-04-21 NOTE — ED Provider Notes (Signed)
History     CSN: [id number]  Arrival date & time 04/21/11  4098   First MD Initiated Contact with Patient 04/21/11 0957      Chief Complaint  Patient presents with  . Leg Pain    (Consider location/radiation/quality/duration/timing/severity/associated sxs/prior treatment) HPI Comments: Patient was seen last night from [facility] with complaints of left lower leg pain - states was sent here for doppler studies - which now reveal a DVT in left lower leg - has no prior history of same.  Patient continues to deny chest pain, shortness of breath or hemoptysis.    Patient is a 78 y.o. male presenting with leg pain. The history is provided by the patient and the spouse. No language interpreter was used.  Leg Pain  The incident occurred more than 2 days ago. The incident occurred at home. There was no injury mechanism. The pain is present in the left leg. The quality of the pain is described as aching. The pain is at a severity of 5/10. The pain is moderate. The pain has been constant since onset. Pertinent negatives include no numbness, no inability to bear weight, no loss of motion, no muscle weakness, no loss of sensation and no tingling. He reports no foreign bodies present. The symptoms are aggravated by nothing. He has tried nothing for the symptoms. The treatment provided no relief.    Past Medical History  Diagnosis Date  . Mini stroke   . Irregular heart beat     Past Surgical History  Procedure Date  . Left arm     History reviewed. No pertinent family history.  History  Substance Use Topics  . Smoking status: Never Smoker   . Smokeless tobacco: Not on file  . Alcohol Use: No      Review of Systems  Neurological: Negative for tingling and numbness.  All other systems reviewed and are negative.    Allergies  Review of patient's allergies indicates no known allergies.  Home Medications   Current Outpatient Rx  Name Route Sig Dispense Refill  .  ASPIRIN 81 MG PO TABS Oral Take 160 mg by mouth daily.    Marland Kitchen HYDROCODONE-ACETAMINOPHEN 5-325 MG PO TABS Oral Take 1 tablet by mouth every 6 (six) hours as needed.    Marland Kitchen SIMVASTATIN 40 MG PO TABS Oral Take 40 mg by mouth every evening.      BP 115/69  Pulse 86  Temp(Src) 97.8 F (36.6 C) (Oral)  Resp 20  SpO2 100%  Physical Exam  Nursing note and vitals reviewed. Constitutional: He is oriented to person, place, and time. He appears well-developed and well-nourished. No distress.  HENT:  Head: Normocephalic and atraumatic.  Right Ear: External ear normal.  Left Ear: External ear normal.  Nose: Nose normal.  Mouth/Throat: Oropharynx is clear and moist. No oropharyngeal exudate.  Eyes: Conjunctivae are normal. Pupils are equal, round, and reactive to light. No scleral icterus.  Neck: Normal range of motion. Neck supple.  Cardiovascular: Normal rate, regular rhythm and normal heart sounds.  Exam reveals no gallop and no friction rub.   No murmur heard. Pulmonary/Chest: Effort normal and breath sounds normal. No respiratory distress. He exhibits no tenderness.  Abdominal: Soft. Bowel sounds are normal.  Musculoskeletal:       Mild 1+ edema noted to right LE 2+ edema noted and calf tenderness to left LE - no redness  Lymphadenopathy:    He has no cervical adenopathy.  Neurological: He is alert and  oriented to person, place, and time.  Skin: Skin is warm and dry.  Psychiatric: He has a normal mood and affect. His behavior is normal. Judgment and thought content normal.    ED Course  Procedures (including critical care time)   Labs Reviewed  CBC  DIFFERENTIAL  BASIC METABOLIC PANEL  APTT  PROTIME-INR   Dg Chest 2 View  04/21/2011  *RADIOLOGY REPORT*  Clinical Data: Left arm pain.  Leg swelling.  CHEST - 2 VIEW  Comparison: 10/25/2010  Findings:  Cardiopericardial silhouette within normal limits. Mediastinal contours normal. Trachea midline.  No airspace disease or effusion.   Right glenohumeral osteoarthritis. Stable pulmonary nodule in the right lung overlying the inferior right scapula measuring 8 mm.  This is unchanged compared to 03/25/2009.  Aortic arch atherosclerosis. Monitoring leads are projected over the chest.  IMPRESSION: No active cardiopulmonary disease. Stable benign 8 mm right middle lobe pulmonary nodule.  Original Report Authenticated By: Andreas Newport, M.D.     Left lower extremity DVT    MDM   Spoke with Dr. Su Hilt about the patient - he is concerned that the patient is non-compliant with medications and would like the patient to be admitted to the hospital for his DVT, I spoke with the hospitalist who recommends we begin the patient on Xarelto orally which can be started today.  I have ordered the first dose then the patient can be sent home on 15mg  po BID x 21 days, then 20mg  po daily for the following month.  I have talked with care management who has provided [place] with a free trial offer for the medication for 1 month.  Once the patient's wife and daughter arrive, then we will give them this information.      Izola Price [place], [place] 04/21/11 1453

## 2011-04-21 NOTE — Progress Notes (Signed)
*  PRELIMINARY RESULTS*  BLEV  has been performed.  Right - No obvious deep vein thrombosis. Left - Evidence of deep vein thrombosis involving the left lower extremity. No evidence of a Baker's cyst bilaterally.  Raymond Rangel 04/21/2011, 9:51 AM

## 2011-04-21 NOTE — ED Notes (Signed)
Pt daughter has demonstrated ability to do  Give pt injections

## 2011-04-21 NOTE — ED Provider Notes (Signed)
History     CSN: [id number]  Arrival date & time 04/20/11  1941   First MD Initiated Contact with Patient 04/21/11 0107      Chief Complaint  Patient presents with  . Leg Swelling  . Arm Pain    (Consider location/radiation/quality/duration/timing/severity/associated sxs/prior treatment) HPI Comments: Patient presents with family members because the patient had onset of pain and swelling in his left leg approximately 24 hours ago. The pain is constant, mild, gradually worsening as is the swelling. He denies fevers, chills, shortness of breath, chest pain. He does have a mild cough which has been subacute per family. There is been no diarrhea, fever, nausea, vomiting. He has no history of cancer or blood clots in the past.  Patient is a 78 y.o. male presenting with arm pain. The history is provided by the patient and a relative. History Limited By: Dementia.  Arm Pain    Past Medical History  Diagnosis Date  . Mini stroke   . Irregular heart beat     Past Surgical History  Procedure Date  . Left arm     No family history on file.  History  Substance Use Topics  . Smoking status: Never Smoker   . Smokeless tobacco: Not on file  . Alcohol Use: No      Review of Systems  All other systems reviewed and are negative.    Allergies  Review of patient's allergies indicates no known allergies.  Home Medications   Current Outpatient Rx  Name Route Sig Dispense Refill  . ASPIRIN 81 MG PO TABS Oral Take 160 mg by mouth daily.    Marland Kitchen HYDROCODONE-ACETAMINOPHEN 5-325 MG PO TABS Oral Take 1 tablet by mouth every 6 (six) hours as needed.    Marland Kitchen SIMVASTATIN 40 MG PO TABS Oral Take 40 mg by mouth every evening.    Marland Kitchen HYDROCORTISONE VALERATE 0.2 % EX OINT Topical Apply topically 2 (two) times daily. 45 g 1    BP 128/75  Pulse 74  Temp(Src) 98.6 F (37 C) (Oral)  Resp 19  SpO2 97%  Physical Exam  Nursing note and vitals reviewed. Constitutional: He appears well-developed  and well-nourished. No distress.  HENT:  Head: Normocephalic and atraumatic.  Mouth/Throat: Oropharynx is clear and moist. No oropharyngeal exudate.  Eyes: Conjunctivae and EOM are normal. Pupils are equal, round, and reactive to light. Right eye exhibits no discharge. Left eye exhibits no discharge. No scleral icterus.  Neck: Normal range of motion. Neck supple. No JVD present. No thyromegaly present.  Cardiovascular: Normal rate, regular rhythm and intact distal pulses.  Exam reveals no gallop and no friction rub.   No murmur heard.      S3 present  Pulmonary/Chest: Effort normal and breath sounds normal. No respiratory distress. He has no wheezes. He has no rales.  Abdominal: Soft. Bowel sounds are normal. He exhibits no distension and no mass. There is no tenderness.  Musculoskeletal: Normal range of motion. He exhibits edema and tenderness.       Mild tenderness and 1+ pitting edema to the left lower extremity below the knee. Right leg is normal. No redness, no warmth. Left upper extremity with mild pain in the left shoulder and left bicep region with abduction of the arm. This is reproducible per the patient.  Lymphadenopathy:    He has no cervical adenopathy.  Neurological: He is alert. Coordination normal.  Skin: Skin is warm and dry. No rash noted. No erythema.  Psychiatric: He has  a normal mood and affect. His behavior is normal.    ED Course  Procedures (including critical care time)  Labs Reviewed  CBC - Abnormal; Notable for the following:    Hemoglobin 12.9 (*)    HCT 38.9 (*)    All other components within normal limits  BASIC METABOLIC PANEL - Abnormal; Notable for the following:    Sodium 133 (*)    Potassium 5.6 (*) HEMOLYZED SPECIMEN, RESULTS MAY BE AFFECTED   GFR calc non Af Amer 83 (*)    All other components within normal limits  D-DIMER, QUANTITATIVE - Abnormal; Notable for the following:    D-Dimer, Quant 1.73 (*)    All other components within normal limits   PROTIME-INR  APTT  TROPONIN I   Dg Chest 2 View  04/21/2011  *RADIOLOGY REPORT*  Clinical Data: Left arm pain.  Leg swelling.  CHEST - 2 VIEW  Comparison: 10/25/2010  Findings:  Cardiopericardial silhouette within normal limits. Mediastinal contours normal. Trachea midline.  No airspace disease or effusion.  Right glenohumeral osteoarthritis. Stable pulmonary nodule in the right lung overlying the inferior right scapula measuring 8 mm.  This is unchanged compared to 03/25/2009.  Aortic arch atherosclerosis. Monitoring leads are projected over the chest.  IMPRESSION: No active cardiopulmonary disease. Stable benign 8 mm right middle lobe pulmonary nodule.  Original Report Authenticated By: Andreas Newport, M.D.     No diagnosis found.    MDM  Vital signs are very reassuring with no hypoxia or tachycardia or fever. He is in no respiratory distress and has normal lung sounds. He does have unilateral swelling of the left lower extremity with concern for DVT. D-dimer has been ordered, ibuprofen to be given for pain. Also had chest x-ray and EKG as the patient have this left arm pain and a mild cough.  ED ECG REPORT   Date: 04/21/2011   Rate: 76  Rhythm: normal sinus rhythm  QRS Axis: left  Intervals: RBBB  ST/T Wave abnormalities: normal  Conduction Disutrbances:right bundle branch block  Narrative Interpretation: old ECG from 06/08/10 reviewed  Old EKG Reviewed: changes noted and Since last EKG, right bundle branch block has developed, no other significant changes.  D-dimer is elevated, this could be consistent with a DVT. Coagulation panel is normal, electrolytes showed slightly elevated potassium. Will give Kayexalate prior to discharge. We'll also give Lovenox and schedule ultrasound appointment in the morning. Patient appears stable otherwise and has chest x-ray without any acute findings.   Vida Roller, MD 04/21/11 [phone]

## 2011-04-21 NOTE — ED Notes (Signed)
Pt off unit to xray.

## 2011-04-21 NOTE — ED Notes (Signed)
Pt states that for the past few months he has been having increasing left leg pain. 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 Alert and oriented.

## 2011-04-21 NOTE — ED Notes (Signed)
MEAL TO PATIENT

## 2011-04-21 NOTE — ED Provider Notes (Signed)
Discussed risks and benefits of taking Xarelto w/ patient's two daughters.  They prefer to administer his lovenox injections.  Nursing staff has trained them.  Pt d/c'd home w/ lovenox, coumadin and ultram.  Advised to keep mobile but use precaution w/ ambulation to prevent falls.  His daughter will call Dr. Su Hilt in the morning to schedule f/u.  Return precautions such as CP and SOB discussed.   Otilio Miu, [place] 04/21/11 1827

## 2011-04-21 NOTE — ED Notes (Signed)
Pt alert, nad, c/o left lower ext swelling, onset was a few days ago, worse today, leg swollen from knee to foot, denies chest pain or sob, ambulates to room, Pedal pulse faint but palpable

## 2011-04-21 NOTE — ED Notes (Signed)
PT HAS ARRIVED IN [facility]U 

## 2011-04-22 NOTE — ED Provider Notes (Signed)
Medical screening examination/treatment/procedure(s) were performed by non-physician practitioner and as supervising physician I was immediately available for consultation/collaboration.  Tinesha Siegrist R. Briani Maul, MD 04/22/11 1204 

## 2011-04-22 NOTE — ED Provider Notes (Signed)
Medical screening examination/treatment/procedure(s) were performed by non-physician practitioner and as supervising physician I was immediately available for consultation/collaboration.  Creasie Lacosse R. Siddhi Dornbush, MD 04/22/11 1205 

## 2011-05-02 ENCOUNTER — Inpatient Hospital Stay (HOSPITAL_COMMUNITY)
Admission: EM | Admit: 2011-05-02 | Discharge: 2011-05-04 | DRG: 641 | Disposition: A | Discharge status: Home Health | Payer: Medicare Other | Attending: Internal Medicine | Admitting: Internal Medicine

## 2011-05-02 ENCOUNTER — Emergency Department (HOSPITAL_COMMUNITY): Payer: Medicare Other

## 2011-05-02 ENCOUNTER — Ambulatory Visit (INDEPENDENT_AMBULATORY_CARE_PROVIDER_SITE_OTHER): Payer: Medicare Other

## 2011-05-02 ENCOUNTER — Encounter (HOSPITAL_COMMUNITY): Payer: Self-pay | Admitting: *Deleted

## 2011-05-02 ENCOUNTER — Other Ambulatory Visit: Payer: Self-pay

## 2011-05-02 DIAGNOSIS — Z9861 Coronary angioplasty status: Secondary | ICD-10-CM

## 2011-05-02 DIAGNOSIS — E876 Hypokalemia: Principal | ICD-10-CM | POA: Diagnosis present

## 2011-05-02 DIAGNOSIS — N39490 Overflow incontinence: Secondary | ICD-10-CM

## 2011-05-02 DIAGNOSIS — Z7982 Long term (current) use of aspirin: Secondary | ICD-10-CM

## 2011-05-02 DIAGNOSIS — M25511 Pain in right shoulder: Secondary | ICD-10-CM | POA: Diagnosis present

## 2011-05-02 DIAGNOSIS — E7800 Pure hypercholesterolemia, unspecified: Secondary | ICD-10-CM | POA: Diagnosis present

## 2011-05-02 DIAGNOSIS — D72829 Elevated white blood cell count, unspecified: Secondary | ICD-10-CM | POA: Diagnosis present

## 2011-05-02 DIAGNOSIS — Z86718 Personal history of other venous thrombosis and embolism: Secondary | ICD-10-CM

## 2011-05-02 DIAGNOSIS — IMO0002 Reserved for concepts with insufficient information to code with codable children: Secondary | ICD-10-CM

## 2011-05-02 DIAGNOSIS — R4182 Altered mental status, unspecified: Secondary | ICD-10-CM

## 2011-05-02 DIAGNOSIS — I6789 Other cerebrovascular disease: Secondary | ICD-10-CM

## 2011-05-02 DIAGNOSIS — M25519 Pain in unspecified shoulder: Secondary | ICD-10-CM | POA: Diagnosis present

## 2011-05-02 DIAGNOSIS — Z7901 Long term (current) use of anticoagulants: Secondary | ICD-10-CM

## 2011-05-02 DIAGNOSIS — R531 Weakness: Secondary | ICD-10-CM | POA: Diagnosis present

## 2011-05-02 DIAGNOSIS — R5381 Other malaise: Secondary | ICD-10-CM | POA: Diagnosis present

## 2011-05-02 DIAGNOSIS — I2510 Atherosclerotic heart disease of native coronary artery without angina pectoris: Secondary | ICD-10-CM | POA: Diagnosis present

## 2011-05-02 DIAGNOSIS — I82409 Acute embolism and thrombosis of unspecified deep veins of unspecified lower extremity: Secondary | ICD-10-CM | POA: Diagnosis present

## 2011-05-02 DIAGNOSIS — E785 Hyperlipidemia, unspecified: Secondary | ICD-10-CM | POA: Diagnosis present

## 2011-05-02 DIAGNOSIS — IMO0001 Reserved for inherently not codable concepts without codable children: Secondary | ICD-10-CM

## 2011-05-02 DIAGNOSIS — F0390 Unspecified dementia without behavioral disturbance: Secondary | ICD-10-CM | POA: Diagnosis present

## 2011-05-02 HISTORY — DX: Unspecified dementia, unspecified severity, without behavioral disturbance, psychotic disturbance, mood disturbance, and anxiety: F03.90

## 2011-05-02 HISTORY — DX: Pure hypercholesterolemia, unspecified: E78.00

## 2011-05-02 HISTORY — DX: Acute embolism and thrombosis of unspecified deep veins of unspecified lower extremity: I82.409

## 2011-05-02 LAB — URINALYSIS, ROUTINE W REFLEX MICROSCOPIC
Ketones, ur: 15 mg/dL — AB
Nitrite: NEGATIVE
Urobilinogen, UA: 1 mg/dL (ref 0.0–1.0)

## 2011-05-02 LAB — DIFFERENTIAL
Eosinophils Relative: 0 % (ref 0–5)
Lymphocytes Relative: 16 % (ref 12–46)
Lymphs Abs: 2.2 10*3/uL (ref 0.7–4.0)

## 2011-05-02 LAB — COMPREHENSIVE METABOLIC PANEL
ALT: 19 U/L (ref 0–53)
Alkaline Phosphatase: 112 U/L (ref 39–117)
CO2: 22 mEq/L (ref 19–32)
Calcium: 9.2 mg/dL (ref 8.4–10.5)
GFR calc Af Amer: >90 mL/min (ref >90)
GFR calc non Af Amer: 82 mL/min — ABNORMAL LOW (ref >90)
Glucose, Bld: 120 mg/dL — ABNORMAL HIGH (ref 70–99)
Potassium: 3.3 mEq/L — ABNORMAL LOW (ref 3.5–5.1)
Sodium: 136 mEq/L (ref 135–145)
Total Bilirubin: 0.6 mg/dL (ref 0.3–1.2)

## 2011-05-02 LAB — CBC
MCV: 86.1 fL (ref 78.0–100.0)
Platelets: 232 10*3/uL (ref 150–400)
RBC: 4.46 MIL/uL (ref 4.22–5.81)
WBC: 13.3 10*3/uL — ABNORMAL HIGH (ref 4.0–10.5)

## 2011-05-02 LAB — AMMONIA: Ammonia: 13 umol/L (ref 11–60)

## 2011-05-02 LAB — APTT: aPTT: 77 seconds — ABNORMAL HIGH (ref 24–37)

## 2011-05-02 LAB — URINE CULTURE

## 2011-05-02 LAB — PROTIME-INR: Prothrombin Time: 28.3 seconds — ABNORMAL HIGH (ref 11.6–15.2)

## 2011-05-02 MED ORDER — SODIUM CHLORIDE 0.9 % IV SOLN
Freq: Once | INTRAVENOUS | Status: AC
  Administered 2011-05-02: 18:00:00 via INTRAVENOUS

## 2011-05-02 NOTE — ED Notes (Signed)
Assumed care of pt from Raymond Reel, RN.  Pt sleeping, no distress noted.  Family states that pt stated that his (R) arm was hurting prior to going to sleep.

## 2011-05-02 NOTE — ED Provider Notes (Signed)
History     CSN: [id number]  Arrival date & time 05/02/11  1647   First MD Initiated Contact with Patient 05/02/11 1704      Chief Complaint  Patient presents with  . Stroke Symptoms    unable to lift right arm   . Altered Mental Status    (Consider location/radiation/quality/duration/timing/severity/associated sxs/prior treatment) Patient is a 78 y.o. male presenting with altered mental status. The history is provided by the patient. The history is limited by the condition of the patient.  Altered Mental Status   patient presents with altered mental status and confusion x24 hours. Patient complains of weakness to his right arm and states he is able to lift it. States that this is due to pain. No slurred speech no blurred vision no headache. Family noted that he was not acting himself and called EMS and patient was sent here. Does have a recent history of DVT diagnosis of left leg. Denies vomiting, fever, diarrhea. Nothing makes her symptoms better or worse. Unknown if the patient has had any urinary symptoms  Past Medical History  Diagnosis Date  . Mini stroke   . Irregular heart beat   . Blood clot in vein     left leg dvt  . Dementia   . DVT (deep venous thrombosis)   . Hypercholesteremia     Past Surgical History  Procedure Date  . Left arm     No family history on file.  History  Substance Use Topics  . Smoking status: Never Smoker   . Smokeless tobacco: Not on file  . Alcohol Use: No      Review of Systems  Unable to perform ROS Psychiatric/Behavioral: Positive for altered mental status.    Allergies  Review of patient's allergies indicates no known allergies.  Home Medications   Current Outpatient Rx  Name Route Sig Dispense Refill  . ASPIRIN 81 MG PO TABS Oral Take 160 mg by mouth daily.    Marland Kitchen HYDROCODONE-ACETAMINOPHEN 5-325 MG PO TABS Oral Take 1 tablet by mouth every 6 (six) hours as needed. For pain.    Marland Kitchen SIMVASTATIN 40 MG PO TABS Oral Take 40  mg by mouth every evening.    . WARFARIN SODIUM 5 MG PO TABS Oral Take 5 mg by mouth daily.    . TRAMADOL HCL 50 MG PO TABS Oral Take 50 mg by mouth every 6 (six) hours as needed. For pain.      BP 130/80  Pulse 94  Temp(Src) 98.2 F (36.8 C) (Oral)  Resp 14  SpO2 98%  Physical Exam  Nursing note and vitals reviewed. Constitutional: He appears well-developed and well-nourished.  Non-toxic appearance. No distress.  HENT:  Head: Normocephalic and atraumatic.  Eyes: Conjunctivae, EOM and lids are normal. Pupils are equal, round, and reactive to light.  Neck: Normal range of motion. Neck supple. No tracheal deviation present. No mass present.  Cardiovascular: Normal rate, regular rhythm and normal heart sounds.  Exam reveals no gallop.   No murmur heard. Pulmonary/Chest: Effort normal and breath sounds normal. No stridor. No respiratory distress. He has no decreased breath sounds. He has no wheezes. He has no rhonchi. He has no rales.  Abdominal: Soft. Normal appearance and bowel sounds are normal. He exhibits no distension. There is no tenderness. There is no rebound and no CVA tenderness.  Musculoskeletal: Normal range of motion. He exhibits no edema and no tenderness.       Right shoulder: He exhibits tenderness, swelling  and pain.  Neurological: He is alert. A sensory deficit is present. No cranial nerve deficit. He exhibits abnormal muscle tone. GCS eye subscore is 4. GCS verbal subscore is 5. GCS motor subscore is 6.        Patient with decreased strength right upper extremity 2/5--no facial droop, speech normal. Cerebellar function is grossly intact  Skin: Skin is warm and dry. No abrasion and no rash noted.  Psychiatric: He has a normal mood and affect. His speech is normal and behavior is normal.    ED Course  Procedures (including critical care time)   Labs Reviewed  CBC  DIFFERENTIAL  COMPREHENSIVE METABOLIC PANEL  URINALYSIS, ROUTINE W REFLEX MICROSCOPIC  URINE CULTURE   PROTIME-INR  APTT  AMMONIA   No results found.   No diagnosis found.    MDM  Spoke with patient's family they note that he had urinary incontinence and some confusion today. No seizure activity noted. Patient's sensorium has been clear here. Right shoulder swelling noted. X-rays negative. Patient to be admitted for evaluation of his altered mental status        Toy Baker, MD 05/02/11 2147

## 2011-05-02 NOTE — ED Notes (Signed)
Pt unable to urinate at this time.  Pt family stated that pt urinated on self right before coming to ER.  Pt states he will try in a little while.

## 2011-05-02 NOTE — ED Notes (Signed)
Pt was noted to be sluggish this morning at at 0800 and unable to lt rt arm

## 2011-05-02 NOTE — ED Notes (Signed)
Pt is here from [place] [facility] with sluggishness to respond and unable to lift right arm and woke up with this at 0800 and LSN 7pm last nite.  Pt had recent left leg dvt and had been placed on coumadin and lovenox.    bp 150/78.  cbg-106.  No chest pain.  Reported slight fever yesterday

## 2011-05-02 NOTE — H&P (Signed)
Rollins Wrightson is an 78 y.o. male.  PCP - Dr.Ronald Su Hilt History obtained from ER physician and family and previous records.  Chief Complaint: Difficulty walking and right upper extremity weakness. HPI: 77 year old male which is recent diagnosis of DVT of his left lower extremity 10 days ago and was discharged on Lovenox and Coumadin, history of previous CAD status post stenting, history of hyperlipidemia and mild dementia was brought into the ER by patient's family as patient was found to be mildly confused and found difficult to walk. Patient patient's family noticed that he had difficulty moving his right upper extremity which was new. In the ER the ER physician noted that patient has right shoulder pain on abducting an x-ray of the right shoulder which shows chronic rotator cuff tear with subluxation of the right shoulder. CT of the head did not get the acute. Patient as per the family was confused initially but by the time I examined patient is back to his baseline mental status. Family states they recorded a fever of 101F at home. In the ER is afebrile but does have mild leukocytosis. Patient does not have a cough chest pain nausea vomiting abdominal pain or diarrhea. Denies any pain in his lower extremities. Denies any headache at this time though he had some headache earlier in the day.  Past Medical History  Diagnosis Date  . Mini stroke   . Irregular heart beat   . Blood clot in vein     left leg dvt  . Dementia   . DVT (deep venous thrombosis)   . Hypercholesteremia     Past Surgical History  Procedure Date  . Left arm     History reviewed. No pertinent family history. Social History:  reports that he has never smoked. He does not have any smokeless tobacco history on file. He reports that he does not drink alcohol or use illicit drugs.  Allergies: No Known Allergies  Medications Prior to Admission  Medication Dose Route Frequency Provider Last Rate Last Dose  . 0.9 %   sodium chloride infusion   Intravenous Once Toy Baker, MD 125 mL/hr at 05/02/11 1826     Medications Prior to Admission  Medication Sig Dispense Refill  . aspirin 81 MG tablet Take 160 mg by mouth daily.      Marland Kitchen HYDROcodone-acetaminophen (NORCO) 5-325 MG per tablet Take 1 tablet by mouth every 6 (six) hours as needed. For pain.      . simvastatin (ZOCOR) 40 MG tablet Take 40 mg by mouth every evening.      . warfarin (COUMADIN) 5 MG tablet Take 5 mg by mouth daily.      . traMADol (ULTRAM) 50 MG tablet Take 50 mg by mouth every 6 (six) hours as needed. For pain.        Results for orders placed during the hospital encounter of 05/02/11 (from the past 48 hour(s))  CBC     Status: Abnormal   Collection Time   05/02/11  5:33 PM      Component Value Range Comment   WBC 13.3 (*) 4.0 - 10.5 (K/uL)    RBC 4.46  4.22 - 5.81 (MIL/uL)    Hemoglobin 12.7 (*) 13.0 - 17.0 (g/dL)    HCT 16.1 (*) 09.6 - 52.0 (%)    MCV 86.1  78.0 - 100.0 (fL)    MCH 28.5  26.0 - 34.0 (pg)    MCHC 33.1  30.0 - 36.0 (g/dL)    RDW 14.6  11.5 - 15.5 (%)    Platelets 232  150 - 400 (K/uL)   DIFFERENTIAL     Status: Abnormal   Collection Time   05/02/11  5:33 PM      Component Value Range Comment   Neutrophils Relative 68  43 - 77 (%)    Neutro Abs 9.0 (*) 1.7 - 7.7 (K/uL)    Lymphocytes Relative 16  12 - 46 (%)    Lymphs Abs 2.2  0.7 - 4.0 (K/uL)    Monocytes Relative 15 (*) 3 - 12 (%)    Monocytes Absolute 2.1 (*) 0.1 - 1.0 (K/uL)    Eosinophils Relative 0  0 - 5 (%)    Eosinophils Absolute 0.0  0.0 - 0.7 (K/uL)    Basophils Relative 0  0 - 1 (%)    Basophils Absolute 0.0  0.0 - 0.1 (K/uL)   COMPREHENSIVE METABOLIC PANEL     Status: Abnormal   Collection Time   05/02/11  5:33 PM      Component Value Range Comment   Sodium 136  135 - 145 (mEq/L)    Potassium 3.3 (*) 3.5 - 5.1 (mEq/L)    Chloride 101  96 - 112 (mEq/L)    CO2 22  19 - 32 (mEq/L)    Glucose, Bld 120 (*) 70 - 99 (mg/dL)    BUN 9  6 - 23  (mg/dL)    Creatinine, Ser 5.62  0.50 - 1.35 (mg/dL)    Calcium 9.2  8.4 - 10.5 (mg/dL)    Total Protein 8.0  6.0 - 8.3 (g/dL)    Albumin 3.4 (*) 3.5 - 5.2 (g/dL)    AST 22  0 - 37 (U/L)    ALT 19  0 - 53 (U/L)    Alkaline Phosphatase 112  39 - 117 (U/L)    Total Bilirubin 0.6  0.3 - 1.2 (mg/dL)    GFR calc non Af Amer 82 (*) >90 (mL/min)    GFR calc Af Amer >90  >90 (mL/min)   PROTIME-INR     Status: Abnormal   Collection Time   05/02/11  5:33 PM      Component Value Range Comment   Prothrombin Time 28.3 (*) 11.6 - 15.2 (seconds)    INR 2.60 (*) 0.00 - 1.49    APTT     Status: Abnormal   Collection Time   05/02/11  5:33 PM      Component Value Range Comment   aPTT 77 (*) 24 - 37 (seconds)   AMMONIA     Status: Normal   Collection Time   05/02/11  5:40 PM      Component Value Range Comment   Ammonia 13  11 - 60 (umol/L)   URINALYSIS, ROUTINE W REFLEX MICROSCOPIC     Status: Abnormal   Collection Time   05/02/11  8:17 PM      Component Value Range Comment   Color, Urine AMBER (*) YELLOW  BIOCHEMICALS MAY BE AFFECTED BY COLOR   APPearance CLOUDY (*) CLEAR     Specific Gravity, Urine 1.025  1.005 - 1.030     pH 5.5  5.0 - 8.0     Glucose, UA NEGATIVE  NEGATIVE (mg/dL)    Hgb urine dipstick MODERATE (*) NEGATIVE     Bilirubin Urine SMALL (*) NEGATIVE     Ketones, ur 15 (*) NEGATIVE (mg/dL)    Protein, ur >130 (*) NEGATIVE (mg/dL)    Urobilinogen, UA 1.0  0.0 -  1.0 (mg/dL)    Nitrite NEGATIVE  NEGATIVE     Leukocytes, UA NEGATIVE  NEGATIVE    URINE MICROSCOPIC-ADD ON     Status: Abnormal   Collection Time   05/02/11  8:17 PM      Component Value Range Comment   RBC / HPF 3-6  <3 (RBC/hpf)    Bacteria, UA FEW (*) RARE     Urine-Other MUCOUS PRESENT   AMORPHOUS URATES/PHOSPHATES   Dg Chest 2 View  05/02/2011  *RADIOLOGY REPORT*  Clinical Data: Altered mental status.  CHEST - 2 VIEW  Comparison: Plain films of the chest 04/21/2011, 10/25/2010 and 03/25/2009.  Findings: 0.8 cm  right middle lobe nodule is unchanged.  Lungs are otherwise clear.  Heart size is normal.  No pneumothorax or effusion.  Postoperative change both shoulders noted.  IMPRESSION: No acute finding.  Stable compared to prior exams.  Original Report Authenticated By: Bernadene Bell. Maricela Curet, M.D.   Dg Shoulder Right  05/02/2011  *RADIOLOGY REPORT*  Clinical Data: Right shoulder pain.  RIGHT SHOULDER - 2+ VIEW  Comparison: None.  Findings: The patient has had previous rotator cuff repair. Anchors seen in the greater tuberosity.  The humeral head is superiorly subluxed and articulates with the eroded undersurface of the acromion.  There are moderate degenerative changes of the acromioclavicular joint.  Calcified loose body is seen in the posterior aspect of the joint.  There is sclerosis and multiple subchondral cysts in the humeral head.  IMPRESSION: No acute abnormality.  Chronic complete rotator cuff tear with superior subluxation of the humeral head and osteoarthritic changes of the glenohumeral joint with calcified loose body in the joint.  Original Report Authenticated By: Gwynn Burly, M.D.   Ct Head Wo Contrast  05/02/2011  *RADIOLOGY REPORT*  Clinical Data: Altered mental status.  Confusion.  CT HEAD WITHOUT CONTRAST  Technique:  Contiguous axial images were obtained from the base of the skull through the vertex without contrast.  Comparison: Head CT scan 10/25/2010.  Findings: Again seen is some cortical atrophy and chronic microvascular ischemic change.  No evidence of acute intracranial abnormality including acute infarction, hemorrhage, mass lesion, mass effect, midline shift or abnormal extra-axial fluid collection.  Very mild mucosal thickening is seen in the maxillary sinuses.  There is a air in the cavernous sinuses bilaterally and in the infratemporal fossae bilaterally, likely in small vessels. No hydrocephalus.  Calvarium intact.  IMPRESSION:  1.  Air of the cavernous sinuses and infratemporal  fossae is likely due to vascular access.  No acute intracranial abnormality is identified. 2.  Atrophy and chronic microvascular ischemic change.  Original Report Authenticated By: Bernadene Bell. Maricela Curet, M.D.    Review of Systems  Constitutional: Positive for fever.  HENT: Negative.   Eyes: Negative.   Respiratory: Negative.   Cardiovascular: Negative.   Gastrointestinal: Negative.   Genitourinary: Negative.   Musculoskeletal:       Pain on moving right shoulder.  Skin: Negative.   Neurological:       Confusion difficulty walking.  Psychiatric/Behavioral: Negative.     Blood pressure 136/76, pulse 90, temperature 98.2 F (36.8 C), temperature source Oral, resp. rate 21, SpO2 97.00%. Physical Exam  Constitutional: He appears well-developed and well-nourished. No distress.  HENT:  Head: Normocephalic and atraumatic.  Right Ear: External ear normal.  Left Ear: External ear normal.  Nose: Nose normal.  Mouth/Throat: Oropharynx is clear and moist. No oropharyngeal exudate.  Eyes: Conjunctivae and EOM are normal. Pupils are  equal, round, and reactive to light. Right eye exhibits no discharge. Left eye exhibits no discharge. No scleral icterus.  Neck: Normal range of motion. Neck supple.  Cardiovascular: Normal rate, regular rhythm and normal heart sounds.   Respiratory: Effort normal and breath sounds normal. No respiratory distress. He has no wheezes. He has no rales.  GI: Soft. Bowel sounds are normal. He exhibits no distension. There is no tenderness. There is no rebound and no guarding.  Musculoskeletal: He exhibits no edema and no tenderness.       Has pain on moving his right shoulder. There is no localized tenderness on the shoulder. Left shoulder movement is normal. Mild swelling of his left calf.  Neurological: He is alert.       He knows he is in [place]. Does not know the date and time. He is oriented as to his name. He is able to recognize his daughters. He is able to move  both upper extremities but his right upper extremity movement is reduced because of the pain in his right shoulder. There is no obvious facial asymmetry and tongue is midline.  Skin: Skin is warm and dry. No rash noted. He is not diaphoretic. No erythema.  Psychiatric: His behavior is normal.     Assessment/Plan #1. Difficulty walking with confusion - at this time patient is back to his best baseline mental status. His reflexes are good. The patient did not have any incontinence of urine or bowels. We will get MRI of the brain and PT consult. I feel his difficulty walking probably from generalized weakness and mild dehydration. For now we'll gently rehydrate. #2. Right shoulder pain - patient does have significant pain on abducting his right shoulder. Family states they did not observe any falls. Patient has shows mildly swollen but not erythematous or warm. At this time I have consulted orthopedic consultant on call Dr.Olin. We will follow his recommendations. #3. Recently diagnosed left lower extremity DVT - continue Coumadin per pharmacy. #4. History of CAD and hyperlipidemia - presently denies any chest pain. Continue aspirin and simvastatin.  CODE STATUS - full code.  Eduard Clos. 05/02/2011, 11:17 PM

## 2011-05-02 NOTE — ED Notes (Addendum)
Family at bedside. Spoke to the daughter and was informed that the patient is normally alert,awake, oriented x 4. Daughter claimed that the patient has never had any problems moving his rt arm. Daughter also denied if patient has had any hx of recent fall. Dr. Freida Busman was made aware that the family is at the bedside and hx.

## 2011-05-02 NOTE — ED Notes (Signed)
Dr. Freida Busman at bedside. Patient noted to be c/o pain to the rt arm and shoulder when the arm was raised.

## 2011-05-02 NOTE — ED Notes (Signed)
Attempt to call report to [phone].  RN off the floor and requested 20-30 minutes.  Spoke with charge RN, who also asked for me to call back in 20 minutes.  RN was told that we had to get the pt up in 15 minutes and that I would do bedside reporting.  Charge RN requested 15 minutes and then call back to take report.

## 2011-05-03 ENCOUNTER — Inpatient Hospital Stay (HOSPITAL_COMMUNITY): Payer: Medicare Other

## 2011-05-03 LAB — COMPREHENSIVE METABOLIC PANEL
AST: 16 U/L (ref 0–37)
Albumin: 2.9 g/dL — ABNORMAL LOW (ref 3.5–5.2)
BUN: 9 mg/dL (ref 6–23)
Calcium: 8.8 mg/dL (ref 8.4–10.5)
Chloride: 104 mEq/L (ref 96–112)
Creatinine, Ser: 0.76 mg/dL (ref 0.50–1.35)
Total Bilirubin: 0.7 mg/dL (ref 0.3–1.2)
Total Protein: 7 g/dL (ref 6.0–8.3)

## 2011-05-03 LAB — CBC
Hemoglobin: 11.2 g/dL — ABNORMAL LOW (ref 13.0–17.0)
MCV: 85.2 fL (ref 78.0–100.0)
Platelets: 227 10*3/uL (ref 150–400)
RBC: 3.93 MIL/uL — ABNORMAL LOW (ref 4.22–5.81)
WBC: 11.8 10*3/uL — ABNORMAL HIGH (ref 4.0–10.5)

## 2011-05-03 LAB — MAGNESIUM: Magnesium: 1.9 mg/dL (ref 1.5–2.5)

## 2011-05-03 LAB — GLUCOSE, CAPILLARY: Glucose-Capillary: 96 mg/dL (ref 70–99)

## 2011-05-03 LAB — PROTIME-INR: INR: 3.02 — ABNORMAL HIGH (ref 0.00–1.49)

## 2011-05-03 MED ORDER — SODIUM CHLORIDE 0.9 % IV SOLN
INTRAVENOUS | Status: DC
  Administered 2011-05-03 – 2011-05-04 (×3): via INTRAVENOUS

## 2011-05-03 MED ORDER — ONDANSETRON HCL 4 MG/2ML IJ SOLN: 4.0000 mg | Freq: Four times a day (QID) | INTRAMUSCULAR | Status: DC | PRN

## 2011-05-03 MED ORDER — POTASSIUM CHLORIDE 10 MEQ/100ML IV SOLN
10.0000 meq | INTRAVENOUS | Status: AC
  Administered 2011-05-03 (×2): 10 meq via INTRAVENOUS
  Filled 2011-05-03 (×4): qty 100

## 2011-05-03 MED ORDER — ONDANSETRON HCL 4 MG PO TABS: 4.0000 mg | ORAL_TABLET | Freq: Four times a day (QID) | ORAL | Status: DC | PRN

## 2011-05-03 MED ORDER — TRAMADOL HCL 50 MG PO TABS
50.0000 mg | ORAL_TABLET | Freq: Four times a day (QID) | ORAL | Status: DC | PRN
  Administered 2011-05-03 – 2011-05-04 (×2): 50 mg via ORAL
  Filled 2011-05-03 (×2): qty 1

## 2011-05-03 MED ORDER — POTASSIUM CHLORIDE CRYS ER 20 MEQ PO TBCR
40.0000 meq | EXTENDED_RELEASE_TABLET | ORAL | Status: DC
  Administered 2011-05-03 (×2): 40 meq via ORAL
  Filled 2011-05-03 (×3): qty 2

## 2011-05-03 MED ORDER — ACETAMINOPHEN 325 MG PO TABS: 650.0000 mg | ORAL_TABLET | Freq: Four times a day (QID) | ORAL | Status: DC | PRN

## 2011-05-03 MED ORDER — ACETAMINOPHEN 650 MG RE SUPP
650.0000 mg | Freq: Four times a day (QID) | RECTAL | Status: DC | PRN
  Administered 2011-05-03: 650 mg via RECTAL
  Filled 2011-05-03: qty 1

## 2011-05-03 MED ORDER — WARFARIN SODIUM 5 MG IV SOLR
2.5000 mg | Freq: Once | INTRAVENOUS | Status: AC
  Administered 2011-05-03: 2.5 mg via INTRAVENOUS
  Filled 2011-05-03: qty 1.25

## 2011-05-03 MED ORDER — ASPIRIN 81 MG PO CHEW
160.0000 mg | CHEWABLE_TABLET | Freq: Every day | ORAL | Status: DC
  Administered 2011-05-03 – 2011-05-04 (×2): 162 mg via ORAL
  Filled 2011-05-03 (×2): qty 2

## 2011-05-03 MED ORDER — ENSURE IMMUNE HEALTH PO LIQD
237.0000 mL | Freq: Three times a day (TID) | ORAL | Status: DC
  Administered 2011-05-03 – 2011-05-04 (×2): 237 mL via ORAL

## 2011-05-03 MED ORDER — SIMVASTATIN 40 MG PO TABS
40.0000 mg | ORAL_TABLET | Freq: Every evening | ORAL | Status: DC
  Filled 2011-05-03 (×2): qty 1

## 2011-05-03 MED ORDER — WARFARIN SODIUM 2.5 MG PO TABS
2.5000 mg | ORAL_TABLET | Freq: Once | ORAL | Status: DC
  Filled 2011-05-03: qty 1

## 2011-05-03 MED ORDER — WARFARIN SODIUM 5 MG PO TABS
5.0000 mg | ORAL_TABLET | Freq: Every day | ORAL | Status: DC
  Administered 2011-05-03: 5 mg via ORAL
  Filled 2011-05-03 (×2): qty 1

## 2011-05-03 NOTE — Progress Notes (Signed)
ANTICOAGULATION CONSULT NOTE - Follow Up Consult  Pharmacy Consult for coumadin Indication: recent DVT  No Known Allergies  Patient Measurements: Height: 5\' 9"  (175.3 cm) Weight: 139 lb 1.8 oz (63.1 kg) IBW/kg (Calculated) : 70.7    Vital Signs: Temp: 98.8 F (37.1 C) (01/22 0551) Temp src: Oral (01/22 0551) BP: 143/79 mmHg (01/22 0551) Pulse Rate: 89  (01/22 0551)  Labs:  Basename 05/03/11 0620 05/02/11 1733  HGB 11.2* 12.7*  HCT 33.5* 38.4*  PLT 227 232  APTT -- 77*  LABPROT 31.8* 28.3*  INR 3.02* 2.60*  HEPARINUNFRC -- --  CREATININE 0.76 0.84  CKTOTAL -- --  CKMB -- --  TROPONINI -- --   Estimated Creatinine Clearance: 69 ml/min (by C-G formula based on Cr of 0.76).   Medications:  Scheduled:    . sodium chloride   Intravenous Once  . aspirin  162 mg Oral Daily  . simvastatin  40 mg Oral QPM  . warfarin  5 mg Oral q1800    Assessment: 78 yo male with recent DVT on coumadin and INR with trend up and just above 3.0.  Goal of Therapy:  INR 2-3   Plan:  -Continue coumadin today but will give a 2.5mg  dose today.  Benny Lennert 05/03/2011,8:57 AM

## 2011-05-03 NOTE — Progress Notes (Signed)
Physical Therapy Evaluation Patient Details Name: Raymond Rangel MRN: [id number] DOB: [birth year] Today's Date: 05/03/2011  Problem List:  Patient Active Problem List  Diagnoses  . Weakness generalized  . Right shoulder pain  . DVT (deep venous thrombosis)  . CAD (coronary artery disease)  . Hyperlipidemia  . Dementia    Past Medical History:  Past Medical History  Diagnosis Date  . Mini stroke   . Irregular heart beat   . Blood clot in vein     left leg dvt  . Dementia   . DVT (deep venous thrombosis)   . Hypercholesteremia    Past Surgical History:  Past Surgical History  Procedure Date  . Left arm     PT Assessment/Plan/Recommendation PT Assessment Clinical Impression Statement: Patient currently presents with trunk and bilat LE weakness, decreased trunk mobility with guarded posture, posterior lean in sitting and standing with delayed balance reactions, and overall decreased activity tolerance resulting in decreased functional independence and increased risk of fall. Daughter also notes that patient has been more confused for the past two days. Acute PT to follow patient at a frequency of 3 times per week during acute hospital stay. Recommend follow up home health PT with 24/7 min assist at discharge. Per daughter, family will be able to provide this level of care. In room HEP of ankle pumps and LAQ given to maintain functional strength while seated in chair. Also discussed staff assistance for mobility to prevent fall. Patient and family in agreement with this plan. PT Recommendation/Assessment: Patient will need skilled PT in the acute care venue PT Problem List: Decreased strength;Decreased activity tolerance;Decreased balance;Decreased mobility;Decreased cognition;Decreased knowledge of use of DME;Decreased safety awareness Barriers to Discharge: None PT Therapy Diagnosis : Difficulty walking;Generalized weakness PT Plan PT Frequency: Min 3X/week PT  Treatment/Interventions: DME instruction;Gait training;Stair training;Functional mobility training;Therapeutic activities;Therapeutic exercise;Balance training;Cognitive remediation;Patient/family education PT Recommendation Recommendations for Other Services: OT consult Follow Up Recommendations: Home health PT Equipment Recommended: None recommended by PT PT Goals  Acute Rehab PT Goals PT Goal Formulation: With patient (and daughters) Time For Goal Achievement: 7 days Pt will go Supine/Side to Sit: with min assist;with HOB not 0 degrees (comment degree) PT Goal: Supine/Side to Sit - Progress: Goal set today Pt will go Sit to Supine/Side: with min assist;with HOB not 0 degrees (comment degree) PT Goal: Sit to Supine/Side - Progress: Goal set today Pt will go Sit to Stand: with min assist;with upper extremity assist PT Goal: Sit to Stand - Progress: Goal set today Pt will go Stand to Sit: with min assist;with upper extremity assist PT Goal: Stand to Sit - Progress: Goal set today Pt will Transfer Bed to Chair/Chair to Bed: with min assist PT Transfer Goal: Bed to Chair/Chair to Bed - Progress: Goal set today Pt will Ambulate: 16 - 50 feet;with least restrictive assistive device;with supervision PT Goal: Ambulate - Progress: Goal set today Pt will Go Up / Down Stairs: 3-5 stairs;with rail(s);with min assist PT Goal: Up/Down Stairs - Progress: Goal set today  PT Evaluation Precautions/Restrictions  Precautions Precautions: Fall Required Braces or Orthoses: No Restrictions Weight Bearing Restrictions: No Prior Functioning  Home Living Lives With: Family;Spouse (wife, daughter, and Information systems manager) Receives Help From: Family Type of Home: House Home Layout: One level Home Access: Stairs to enter Entrance Stairs-Rails: Right;Left;Can reach both Entrance Stairs-Number of Steps: 3 Bathroom Shower/Tub: Psychologist, counselling;Door [employer] Toilet: Standard Bathroom Accessibility: Yes How  Accessible: Accessible via walker Home Adaptive Equipment: Bedside commode/3-in-1;Quad cane;Walker - rolling;Shower  chair with back Prior Function Level of Independence: Requires assistive device for independence;Other (comment) (used quad cane for all mobility PTA) Driving: No Vocation: Retired Comments: went to the senior center daily Cognition Cognition Arousal/Alertness: Lethargic Overall Cognitive Status: Impaired (per daughter, patient is "more confused" than baseline) Memory: Appears impaired Memory Deficits: decreased accuracy of recall of prior level of function and home environment, daughter provided mod cues to correct patient during history Orientation Level: Oriented to person;Oriented to place;Disoriented to time;Disoriented to situation Sensation/Coordination Sensation Light Touch: Appears Intact Stereognosis: Not tested Hot/Cold: Not tested Proprioception: Appears Intact Additional Comments: symmetrical, no numbness or tingling Coordination Gross Motor Movements are Fluid and Coordinated: Yes Fine Motor Movements are Fluid and Coordinated: Yes Heel Shin Test: WNL Extremity Assessment RLE Assessment RLE Assessment: Within Functional Limits (AROM WNL. Strength grossly 3+/5.) LLE Assessment LLE Assessment: Within Functional Limits (AROM WNL. Strength grossly 4/5.) Mobility (including Balance) Bed Mobility Bed Mobility: Yes Supine to Sit: 2: Max assist;HOB elevated (Comment degrees) (30 degrees) Supine to Sit Details (indicate cue type and reason): posterior lean, decreased active trunk rotation Sitting - Scoot to Edge of Bed: 3: Mod assist Sitting - Scoot to Edge of Bed Details (indicate cue type and reason): posterior lean, decreased dissociated movement of pelvis and trunk. Rigid movement. Transfers Transfers: Yes Sit to Stand: 3: Mod assist;From bed;With upper extremity assist Sit to Stand Details (indicate cue type and reason): focus on anterior weight shift  and use of left UE to push up for increased biomechanical advantage and functional independence Stand to Sit: 3: Mod assist;To bed;To chair/3-in-1;With upper extremity assist Stand to Sit Details: decreased bilat LE eccentric control and posterior lean noted, cues to reach back with left UE to control descent Stand Pivot Transfers: 3: Mod assist Ambulation/Gait Ambulation/Gait: Yes Ambulation/Gait Assistance: 4: Min assist Ambulation Distance (Feet): 15 Feet Assistive device: 1 person hand held assist Gait Pattern: Step-through pattern;Shuffle;Decreased trunk rotation;Trunk flexed;Lateral hip instability Gait velocity: decreased, patient fatigues quickly Stairs: No Wheelchair Mobility Wheelchair Mobility: No  Posture/Postural Control Posture/Postural Control: Postural limitations Postural Limitations: guarded, rigid posture with decreased dissociated movement throughout trunk and significant posterior lean in unsupported positions Balance Balance Assessed: Yes Static Sitting Balance Static Sitting - Balance Support: No upper extremity supported;Feet supported Static Sitting - Level of Assistance: 4: Min assist Dynamic Sitting Balance Dynamic Sitting - Balance Support: No upper extremity supported;Feet supported;During functional activity Dynamic Sitting - Level of Assistance: 4: Min assist Static Standing Balance Static Standing - Balance Support: Left upper extremity supported Static Standing - Level of Assistance: 4: Min assist;Other (comment) (increased lateral sway ) Dynamic Standing Balance Dynamic Standing - Balance Support: Left upper extremity supported;During functional activity Dynamic Standing - Level of Assistance: 3: Mod assist (delayed hip and ankle strategies, posterior lean)    End of Session PT - End of Session Equipment Utilized During Treatment: Gait belt Activity Tolerance: Patient limited by fatigue Patient left: in chair;with call bell in reach;with  family/visitor present Nurse Communication: Mobility status for transfers General Behavior During Session: [facility] for tasks performed Cognition: Impaired  Romeo Rabon 05/03/2011, 1:21 PM

## 2011-05-03 NOTE — Consult Note (Signed)
Reason for Consult: Right shoulder pain Referring Physician: Andi Hence, MD  Raymond Rangel is an 78 y.o. male.  HPI: 78 yo male RHD admitted due to medical concerns and right shoulder pain.  New onset shoulder pain, no reported trauma.  History of rotator cuff surgery in past, 20-25 years ago.  Some reported fever and leukocytosis  No other upper or lower extremity complaints.   Past Medical History  Diagnosis Date  . Mini stroke   . Irregular heart beat   . Blood clot in vein     left leg dvt  . Dementia   . DVT (deep venous thrombosis)   . Hypercholesteremia     Past Surgical History  Procedure Date  . Left arm     History reviewed. No pertinent family history.  Social History:  reports that he has never smoked. He does not have any smokeless tobacco history on file. He reports that he does not drink alcohol or use illicit drugs.  Allergies: No Known Allergies  Medications: I have reviewed the patient's current medications.  Results for orders placed during the hospital encounter of 05/02/11 (from the past 24 hour(s))  URINALYSIS, ROUTINE W REFLEX MICROSCOPIC     Status: Abnormal   Collection Time   05/02/11  8:17 PM      Component Value Range   Color, Urine AMBER (*) YELLOW    APPearance CLOUDY (*) CLEAR    Specific Gravity, Urine 1.025  1.005 - 1.030    pH 5.5  5.0 - 8.0    Glucose, UA NEGATIVE  NEGATIVE (mg/dL)   Hgb urine dipstick MODERATE (*) NEGATIVE    Bilirubin Urine SMALL (*) NEGATIVE    Ketones, ur 15 (*) NEGATIVE (mg/dL)   Protein, ur >161 (*) NEGATIVE (mg/dL)   Urobilinogen, UA 1.0  0.0 - 1.0 (mg/dL)   Nitrite NEGATIVE  NEGATIVE    Leukocytes, UA NEGATIVE  NEGATIVE   URINE MICROSCOPIC-ADD ON     Status: Abnormal   Collection Time   05/02/11  8:17 PM      Component Value Range   RBC / HPF 3-6  <3 (RBC/hpf)   Bacteria, UA FEW (*) RARE    Urine-Other MUCOUS PRESENT    COMPREHENSIVE METABOLIC PANEL     Status: Abnormal   Collection Time   05/03/11   6:20 AM      Component Value Range   Sodium 138  135 - 145 (mEq/L)   Potassium 2.9 (*) 3.5 - 5.1 (mEq/L)   Chloride 104  96 - 112 (mEq/L)   CO2 22  19 - 32 (mEq/L)   Glucose, Bld 103 (*) 70 - 99 (mg/dL)   BUN 9  6 - 23 (mg/dL)   Creatinine, Ser 0.96  0.50 - 1.35 (mg/dL)   Calcium 8.8  8.4 - 04.5 (mg/dL)   Total Protein 7.0  6.0 - 8.3 (g/dL)   Albumin 2.9 (*) 3.5 - 5.2 (g/dL)   AST 16  0 - 37 (U/L)   ALT 14  0 - 53 (U/L)   Alkaline Phosphatase 96  39 - 117 (U/L)   Total Bilirubin 0.7  0.3 - 1.2 (mg/dL)   GFR calc non Af Amer 86 (*) >90 (mL/min)   GFR calc Af Amer >90  >90 (mL/min)  CBC     Status: Abnormal   Collection Time   05/03/11  6:20 AM      Component Value Range   WBC 11.8 (*) 4.0 - 10.5 (K/uL)  RBC 3.93 (*) 4.22 - 5.81 (MIL/uL)   Hemoglobin 11.2 (*) 13.0 - 17.0 (g/dL)   HCT 45.4 (*) 09.8 - 52.0 (%)   MCV 85.2  78.0 - 100.0 (fL)   MCH 28.5  26.0 - 34.0 (pg)   MCHC 33.4  30.0 - 36.0 (g/dL)   RDW 11.9  14.7 - 82.9 (%)   Platelets 227  150 - 400 (K/uL)  TSH     Status: Normal   Collection Time   05/03/11  6:20 AM      Component Value Range   TSH 2.214  0.350 - 4.500 (uIU/mL)  PROTIME-INR     Status: Abnormal   Collection Time   05/03/11  6:20 AM      Component Value Range   Prothrombin Time 31.8 (*) 11.6 - 15.2 (seconds)   INR 3.02 (*) 0.00 - 1.49   GLUCOSE, CAPILLARY     Status: Normal   Collection Time   05/03/11  7:04 AM      Component Value Range   Glucose-Capillary 96  70 - 99 (mg/dL)   Comment 1 Notify RN    VITAMIN B12     Status: Abnormal   Collection Time   05/03/11  8:29 AM      Component Value Range   Vitamin B-12 1112 (*) 211 - 911 (pg/mL)  MAGNESIUM     Status: Normal   Collection Time   05/03/11  8:29 AM      Component Value Range   Magnesium 1.9  1.5 - 2.5 (mg/dL)  GLUCOSE, CAPILLARY     Status: Normal   Collection Time   05/03/11 11:22 AM      Component Value Range   Glucose-Capillary 92  70 - 99 (mg/dL)    X-ray: Severe rotator cuff  arthropathy, no obvious fractures  ROS: as noted by medical admission, some fever, weakness  Blood pressure 119/73, pulse 87, temperature 97.3 F (36.3 C), temperature source Oral, resp. rate 17, height 5\' 9"  (1.753 m), weight 63.1 kg (139 lb 1.8 oz), SpO2 98.00%.  PE: seen and evaluated at around [place] with daughter and grandson at bedside Awake alert and cooperative  Ortho extremity exam Asymmetrically larger right shoulder Pain with movement, not as much with palpation  O/w grossly NVI RUE compared to left No slurred speech or blurred vision complaints  Assessment/Plan: Sever right shoulder rotator cuff arthropathy with larger effusion vs hemarthrosis  At the time of this report, I had already ordered and spoken to radiology regarding an ultrasound of the shoulder to rule out effusion that would or could be tapped.  They concluded that there was no fluid instead a large mass like substance, unknown to them.  This could represent a large consolidated hematoma in the shoulder from an anticoagulation induced hemarthrosis.  At this point nothing to do from an operative standpoint.  Recommend a sling for comfort, ice to help with pain.  Otherwise have him follow up with Dr. Beverely Low of [facility], [phone], in 2-4 weeks from discharge to review any other treatment options.    Any other questions feel free to contact me at [phone]- or via office [phone].  Thanks.  Chelisa Hennen D 05/03/2011, 6:01 PM

## 2011-05-03 NOTE — Progress Notes (Signed)
Subjective: No CP, no SOB; no abdominal pain. Patient still feeling weak and having right arm/shoulder discomfort. No fever.  Objective: Vital signs in last 24 hours: Temp:  [98.2 F (36.8 C)-98.8 F (37.1 C)] 98.8 F (37.1 C) (01/22 0551) Pulse Rate:  [87-95] 89  (01/22 0551) Resp:  [14-21] 16  (01/22 0551) BP: (117-155)/(60-85) 143/79 mmHg (01/22 0551) SpO2:  [96 %-99 %] 96 % (01/22 0551) FiO2 (%):  [28 %] 28 % (01/22 0021) Weight:  [63.1 kg (139 lb 1.8 oz)] 63.1 kg (139 lb 1.8 oz) (01/22 0012) Weight change:  Last BM Date: 05/01/11  Intake/Output from previous day: 01/21 0701 - 01/22 0700 In: 120 [P.O.:120] Out: 700 [Urine:700]    Physical Exam: General: Alert, awake, oriented x3, in no acute distress. HEENT: No bruits, no goiter. Heart: Regular rate and rhythm, without murmurs, rubs, gallops. Lungs: Clear to auscultation bilaterally. Abdomen: Soft, nontender, nondistended, positive bowel sounds. Extremities: No clubbing cyanosis or edema with positive pedal pulses. Neuro: Grossly intact, nonfocal.  Lab Results: Basic Metabolic Panel:  Basename 05/03/11 0620 05/02/11 1733  NA 138 136  K 2.9* 3.3*  CL 104 101  CO2 22 22  GLUCOSE 103* 120*  BUN 9 9  CREATININE 0.76 0.84  CALCIUM 8.8 9.2  MG -- --  PHOS -- --   Liver Function Tests:  [facility] 05/03/11 0620 05/02/11 1733  AST 16 22  ALT 14 19  ALKPHOS 96 112  BILITOT 0.7 0.6  PROT 7.0 8.0  ALBUMIN 2.9* 3.4*    Basename 05/02/11 1740  AMMONIA 13   CBC:  Basename 05/03/11 0620 05/02/11 1733  WBC 11.8* 13.3*  NEUTROABS -- 9.0*  HGB 11.2* 12.7*  HCT 33.5* 38.4*  MCV 85.2 86.1  PLT 227 232   CBG:  Basename 05/03/11 0704  GLUCAP 96   Coagulation:  Basename 05/03/11 0620 05/02/11 1733  LABPROT 31.8* 28.3*  INR 3.02* 2.60*    Studies/Results: Dg Chest 2 View  05/02/2011  *RADIOLOGY REPORT*  Clinical Data: Altered mental status.  CHEST - 2 VIEW  Comparison: Plain films of the chest  04/21/2011, 10/25/2010 and 03/25/2009.  Findings: 0.8 cm right middle lobe nodule is unchanged.  Lungs are otherwise clear.  Heart size is normal.  No pneumothorax or effusion.  Postoperative change both shoulders noted.  IMPRESSION: No acute finding.  Stable compared to prior exams.  Original Report Authenticated By: Bernadene Bell. Maricela Curet, M.D.   Dg Shoulder Right  05/02/2011  *RADIOLOGY REPORT*  Clinical Data: Right shoulder pain.  RIGHT SHOULDER - 2+ VIEW  Comparison: None.  Findings: The patient has had previous rotator cuff repair. Anchors seen in the greater tuberosity.  The humeral head is superiorly subluxed and articulates with the eroded undersurface of the acromion.  There are moderate degenerative changes of the acromioclavicular joint.  Calcified loose body is seen in the posterior aspect of the joint.  There is sclerosis and multiple subchondral cysts in the humeral head.  IMPRESSION: No acute abnormality.  Chronic complete rotator cuff tear with superior subluxation of the humeral head and osteoarthritic changes of the glenohumeral joint with calcified loose body in the joint.  Original Report Authenticated By: Gwynn Burly, M.D.   Ct Head Wo Contrast  05/02/2011  *RADIOLOGY REPORT*  Clinical Data: Altered mental status.  Confusion.  CT HEAD WITHOUT CONTRAST  Technique:  Contiguous axial images were obtained from the base of the skull through the vertex without contrast.  Comparison: Head CT scan 10/25/2010.  Findings: Again seen is some cortical atrophy and chronic microvascular ischemic change.  No evidence of acute intracranial abnormality including acute infarction, hemorrhage, mass lesion, mass effect, midline shift or abnormal extra-axial fluid collection.  Very mild mucosal thickening is seen in the maxillary sinuses.  There is a air in the cavernous sinuses bilaterally and in the infratemporal fossae bilaterally, likely in small vessels. No hydrocephalus.  Calvarium intact.   IMPRESSION:  1.  Air of the cavernous sinuses and infratemporal fossae is likely due to vascular access.  No acute intracranial abnormality is identified. 2.  Atrophy and chronic microvascular ischemic change.  Original Report Authenticated By: Bernadene Bell. Maricela Curet, M.D.    Medications: Scheduled Meds:    . sodium chloride   Intravenous Once  . aspirin  162 mg Oral Daily  . potassium chloride  40 mEq Oral Q4H  . simvastatin  40 mg Oral QPM  . warfarin  2.5 mg Oral ONCE-1800  . DISCONTD: warfarin  5 mg Oral q1800   Continuous Infusions:   . sodium chloride 75 mL/hr at 05/03/11 0030   PRN Meds:.acetaminophen, acetaminophen, ondansetron (ZOFRAN) IV, ondansetron, traMADol  Assessment/Plan: 1-Weakness generalized: Appears to be secondary to decrease intake and dehydration. No signs of infection. Continue IVF; follow PT/OT evaluation and recommendations. Replete electrolytes.  2-Right shoulder pain: Will follow ortho recommendations; who has been consulted already. Patient had hx of chronic rotator cuff syndrome; on Images appears to be worse.   3-DVT (deep venous thrombosis):Continue coumadin per pharmacy.  4-CAD (coronary artery disease): no signs of coronary syndrome. Continue ASA and Statins.  5-Hyperlipidemia: continue statins.  6-Hypokalemia:will replete and check Mg level.  7-Poor intake: will start ensure.  8-DVT: on coumadin.    LOS: 1 day   Jaleigh Mccroskey [facility] [phone]  05/03/2011, 9:19 AM

## 2011-05-03 NOTE — Progress Notes (Signed)
ANTICOAGULATION CONSULT NOTE - Initial Consult  Pharmacy Consult for Coumadin Indication: DVT  No Known Allergies  Patient Measurements: Weight: 139 lb 1.8 oz (63.1 kg)  Vital Signs: Temp: 98.7 F (37.1 C) (01/22 0012) Temp src: Oral (01/22 0012) BP: 155/85 mmHg (01/22 0012) Pulse Rate: 87  (01/22 0012)  Labs:  Basename 05/02/11 1733  HGB 12.7*  HCT 38.4*  PLT 232  APTT 77*  LABPROT 28.3*  INR 2.60*  HEPARINUNFRC --  CREATININE 0.84  CKTOTAL --  CKMB --  TROPONINI --   CrCl is unknown because there is no height on file for the current visit.  Medical History: Past Medical History  Diagnosis Date  . Mini stroke   . Irregular heart beat   . Blood clot in vein     left leg dvt  . Dementia   . DVT (deep venous thrombosis)   . Hypercholesteremia     Medications:  Prescriptions prior to admission  Medication Sig Dispense Refill  . aspirin 81 MG tablet Take 160 mg by mouth daily.      Marland Kitchen HYDROcodone-acetaminophen (NORCO) 5-325 MG per tablet Take 1 tablet by mouth every 6 (six) hours as needed. For pain.      . simvastatin (ZOCOR) 40 MG tablet Take 40 mg by mouth every evening.      . warfarin (COUMADIN) 5 MG tablet Take 5 mg by mouth daily.       Scheduled:    . sodium chloride   Intravenous Once  . aspirin  162 mg Oral Daily  . simvastatin  40 mg Oral QPM  . warfarin  5 mg Oral q1800    Assessment: 78yo male to continue Coumadin for DVT during admission for weakness and shoulder pain awaiting PT and surgical consults; admitted with therapeutic INR.  Goal of Therapy:  INR 2-3   Plan:  Will continue home Coumadin regimen of 5mg  daily and adjust if needed per INR.  Colleen Can PharmD BCPS 05/03/2011,1:13 AM

## 2011-05-03 NOTE — Progress Notes (Signed)
Pt. With difficulty swallowing pills; wet voice after this. Per pt. Has had swallowing issues "off and on." Dr. Gwenlyn Perking was made aware; orders received for NPO and ST swallow eval.

## 2011-05-04 LAB — CBC
HCT: 36.5 % — ABNORMAL LOW (ref 39.0–52.0)
Hemoglobin: 12.4 g/dL — ABNORMAL LOW (ref 13.0–17.0)
RBC: 4.26 MIL/uL (ref 4.22–5.81)
WBC: 12.8 10*3/uL — ABNORMAL HIGH (ref 4.0–10.5)

## 2011-05-04 LAB — BASIC METABOLIC PANEL
Chloride: 104 mEq/L (ref 96–112)
GFR calc Af Amer: >90 mL/min (ref >90)
GFR calc non Af Amer: >90 mL/min (ref >90)
Potassium: 3.3 mEq/L — ABNORMAL LOW (ref 3.5–5.1)
Sodium: 139 mEq/L (ref 135–145)

## 2011-05-04 LAB — GLUCOSE, CAPILLARY: Glucose-Capillary: 117 mg/dL — ABNORMAL HIGH (ref 70–99)

## 2011-05-04 LAB — PROTIME-INR
INR: 3.34 — ABNORMAL HIGH (ref 0.00–1.49)
Prothrombin Time: 34.4 seconds — ABNORMAL HIGH (ref 11.6–15.2)

## 2011-05-04 MED ORDER — POTASSIUM CHLORIDE CRYS ER 20 MEQ PO TBCR
40.0000 meq | EXTENDED_RELEASE_TABLET | Freq: Once | ORAL | Status: AC
  Administered 2011-05-04: 40 meq via ORAL
  Filled 2011-05-04 (×2): qty 2

## 2011-05-04 MED ORDER — WARFARIN SODIUM 5 MG PO TABS: 5.0000 mg | ORAL_TABLET | Freq: Every day | ORAL | Status: DC

## 2011-05-04 MED ORDER — ENSURE IMMUNE HEALTH PO LIQD: 237.0000 mL | Freq: Three times a day (TID) | ORAL | Status: DC

## 2011-05-04 NOTE — Progress Notes (Signed)
   CARE MANAGEMENT NOTE 05/04/2011  Patient:  Raymond Rangel, Raymond Rangel   Account Number:  [id number]  Date Initiated:  05/04/2011  Documentation initiated by:  Junius Creamer  Subjective/Objective Assessment:   adm w weakness, has dementia     Action/Plan:   lives w fam-wife and 2 da's, pcp dr Zenaida Deed   Anticipated DC Date:  05/07/2011   Anticipated DC Plan:  HOME W HOME HEALTH SERVICES      DC Planning Services  CM consult      Choice offered to / List presented to:             Status of service:   Medicare Important Message given?   (If response is "NO", the following Medicare IM given date fields will be blank) Date Medicare IM given:   Date Additional Medicare IM given:    Discharge Disposition:    Per UR Regulation:  Reviewed for med. necessity/level of care/duration of stay  Comments:  1/23  spoke w pt-2 da's and explained role of cm, will await rec for hhc, debbie Naziah Weckerly rn,bsn [phone]

## 2011-05-04 NOTE — Discharge Summary (Signed)
DISCHARGE SUMMARY  Raymond Rangel  MR#: [id number]  DOB:January 03, 1932  Date of Admission: 05/02/2011 Date of Discharge: 05/04/2011  Attending Physician:Raymond Rangel  Patient's WUJ:[id number], Raymond Ammons, MD, MD  Consults:Treatment Team:  Raymond Pal, MD  Discharge Diagnoses: Present on Admission:  .Weakness generalized .Right shoulder pain .DVT (deep venous thrombosis) .CAD (coronary artery disease) .Hyperlipidemia .Dementia hypokalemia    Medication List  As of 05/04/2011  4:22 PM   STOP taking these medications         traMADol 50 MG tablet         TAKE these medications         aspirin 81 MG tablet   Take 160 mg by mouth daily.      feeding supplement Liqd   Take 237 mLs by mouth 3 (three) times daily with meals.      HYDROcodone-acetaminophen 5-325 MG per tablet   Commonly known as: NORCO   Take 1 tablet by mouth every 6 (six) hours as needed. For pain.      simvastatin 40 MG tablet   Commonly known as: ZOCOR   Take 40 mg by mouth every evening.      warfarin 5 MG tablet   Commonly known as: COUMADIN   Take 1 tablet (5 mg total) by mouth daily.          Brief Admit Note: 78 year old male which is recent diagnosis of DVT of his left lower extremity 10 days ago and was discharged on Lovenox and Coumadin, history of previous CAD status post stenting, history of hyperlipidemia and mild dementia was brought into the ER by patient's family as patient was found to be mildly confused and found difficult to walk. Patient patient's family noticed that he had difficulty moving his right upper extremity which was new. In the ER the ER physician noted that patient has right shoulder pain on abducting an x-ray of the right shoulder which shows chronic rotator cuff tear with subluxation of the right shoulder. He was admitted to hospitalist service for evaluation and management of right shoulder pain. Orthopedic consult was obtained who recommended conservative treatment with  medications.     Hospital Course: Present on Admission:  Weakness generalized (05/02/2011) Probably secondary to hypokalemia and general deconditioning, potassium repleted. PT/OT evaluation obtained recommended home health PT. He was worked up to see if he has cva, MRI of the head did not show any new infarcts. There is evidence of old infarcts and white matter changes.  Right shoulder pain (05/02/2011)  US of the shoulder showed a mass like substance and This could represent a large consolidated hematoma in the shoulder from an anticoagulation induced hemarthrosis.  At this point nothing to do from an operative standpoint from orthopedic standpoint.   Pain control and PT. DVT (deep venous thrombosis) (05/02/2011) Supra therapeutic INR. Hold coumadin tonight.  Check INR by Home health services by RN.  CAD (coronary artery disease) (05/02/2011) s/p PCI in 2005. Continue with aspirin. I see a h/o irregular heart beat in one of the Progress notes and the patient and the family confirms it. He is not any rate or rhythm controlling medications. The last ventriculogram is from 2005 showing a EF of 50%. He currently denies any chest pain or palpitations and his EKG is unchanged from 2012. I have recommended him to follow up with his PCP and cardiologist as outpatient for further workup or recommendations.   Hyperlipidemia (05/02/2011) Continue with statin.  Dementia (05/02/2011) Stable.  Difficulty swallowing yesterday, on  further questioning pt had some pain when swallowing, probably referred pain from the shoulder to the neck. Currently he does not have pain when swallowing. SLP recommended soft diet. RESUMED diet without any complication.  Hypokalemia: repleted. Last potassium is 3.3 and he got 40 meq of potassium before discharge.   Mild leukocytosis: most likely reactive. He is afebrile. Urine culture is negative. Recommend follow up CBC in 1 to 2 weeks at PCP office.    Day of Discharge BP  117/73  Pulse 96  Temp(Src) 98.8 F (37.1 C) (Oral)  Resp 16  Ht 5\' 9"  (1.753 m)  Wt 63.1 kg (139 lb 1.8 oz)  BMI 20.54 kg/m2  SpO2 95%  Physical Exam:  General: Alert, awake, oriented x3, in no acute distress.  HEENT: No bruits, no goiter.  Heart: Regular rate and rhythm, without murmurs, rubs, gallops.  Lungs: Clear to auscultation bilaterally.  Abdomen: Soft, nontender, nondistended, positive bowel sounds.  Extremities: No clubbing cyanosis or edema with positive pedal pulses.  Neuro: Grossly intact, nonfocal.  Results for orders placed during the hospital encounter of 05/02/11 (from the past 24 hour(s))  GLUCOSE, CAPILLARY     Status: Abnormal   Collection Time   05/03/11  9:33 PM      Component Value Range   Glucose-Capillary 102 (*) 70 - 99 (mg/dL)   Comment 1 Notify RN    PROTIME-INR     Status: Abnormal   Collection Time   05/04/11  6:03 AM      Component Value Range   Prothrombin Time 34.4 (*) 11.6 - 15.2 (seconds)   INR 3.34 (*) 0.00 - 1.49   GLUCOSE, CAPILLARY     Status: Normal   Collection Time   05/04/11  6:46 AM      Component Value Range   Glucose-Capillary 97  70 - 99 (mg/dL)  BASIC METABOLIC PANEL     Status: Abnormal   Collection Time   05/04/11 11:51 AM      Component Value Range   Sodium 139  135 - 145 (mEq/L)   Potassium 3.3 (*) 3.5 - 5.1 (mEq/L)   Chloride 104  96 - 112 (mEq/L)   CO2 24  19 - 32 (mEq/L)   Glucose, Bld 117 (*) 70 - 99 (mg/dL)   BUN 7  6 - 23 (mg/dL)   Creatinine, Ser 0.98  0.50 - 1.35 (mg/dL)   Calcium 9.0  8.4 - 11.9 (mg/dL)   GFR calc non Af Amer >90  >90 (mL/min)   GFR calc Af Amer >90  >90 (mL/min)  CBC     Status: Abnormal   Collection Time   05/04/11 11:51 AM      Component Value Range   WBC 12.8 (*) 4.0 - 10.5 (K/uL)   RBC 4.26  4.22 - 5.81 (MIL/uL)   Hemoglobin 12.4 (*) 13.0 - 17.0 (g/dL)   HCT 14.7 (*) 82.9 - 52.0 (%)   MCV 85.7  78.0 - 100.0 (fL)   MCH 29.1  26.0 - 34.0 (pg)   MCHC 34.0  30.0 - 36.0 (g/dL)    RDW 56.2  13.0 - 86.5 (%)   Platelets 261  150 - 400 (K/uL)  GLUCOSE, CAPILLARY     Status: Abnormal   Collection Time   05/04/11 12:11 PM      Component Value Range   Glucose-Capillary 117 (*) 70 - 99 (mg/dL)    Disposition: Home with home PT and RN.    Follow-up Appts: Discharge Orders  Future Orders Please Complete By Expires   Diet - low sodium heart healthy      Increase activity slowly      Discharge instructions      Comments:   FOLLOW UP with PCP in one to two weeks. Check cbc and bmp in one week at pcp office.      Follow-up Information    Follow up with ROBERTS, Raymond Ammons, MD in 1 week.      Follow up with Raymond Pal, MD in 2 weeks.   Contact information:   [facility] [address] [place] [address] [phone]       FOLLOW up INR by Home Health RN .          Tests Needing Follow-up: Cbc , bmp, INR   Time spent in discharge (includes decision making & examination of pt): 50 minutes  Signed: Daissy Yerian 05/04/2011, 4:22 PM

## 2011-05-04 NOTE — Progress Notes (Signed)
Occupational Therapy Evaluation Patient Details Name: Raymond Rangel MRN: [id number] DOB: 1931-11-16 Today's Date: 05/04/2011  Problem List:  Patient Active Problem List  Diagnoses  . Weakness generalized  . Right shoulder pain  . DVT (deep venous thrombosis)  . CAD (coronary artery disease)  . Hyperlipidemia  . Dementia    Past Medical History:  Past Medical History  Diagnosis Date  . Mini stroke   . Irregular heart beat   . Blood clot in vein     left leg dvt  . Dementia   . DVT (deep venous thrombosis)   . Hypercholesteremia    Past Surgical History:  Past Surgical History  Procedure Date  . Left arm     OT Assessment/Plan/Recommendation OT Assessment Clinical Impression Statement: Pt presents with R shoulder pain and altered mental status.  Pt will benefit from acute OT to increaseI with functional transfers and ADLs. Pt and daughter educated on proper sling positioning and safe technique to don/doff clothing while supporting R UE. OT Recommendation/Assessment: Patient will need skilled OT in the acute care venue OT Problem List: Decreased strength;Decreased range of motion;Decreased activity tolerance;Impaired balance (sitting and/or standing);Decreased cognition;Decreased knowledge of use of DME or AE;Pain;Impaired UE functional use OT Therapy Diagnosis : Generalized weakness;Acute pain OT Plan OT Frequency: Min 2X/week OT Treatment/Interventions: Self-care/ADL training;DME and/or AE instruction;Therapeutic activities;Cognitive remediation/compensation;Patient/family education OT Recommendation Follow Up Recommendations: Home health OT Equipment Recommended: None recommended by OT Individuals Consulted Consulted and Agree with Results and Recommendations: Patient OT Goals Acute Rehab OT Goals OT Goal Formulation: With patient Time For Goal Achievement: 7 days ADL Goals Pt Will Perform Grooming: with modified independence;Standing at sink ADL Goal: Grooming -  Progress: Goal set today Pt Will Perform Upper Body Dressing: with modified independence;Sitting, chair;Sitting, bed ADL Goal: Upper Body Dressing - Progress: Goal set today Pt Will Perform Lower Body Dressing: with modified independence;Sit to stand from bed;Sit to stand from chair ADL Goal: Lower Body Dressing - Progress: Goal set today Pt Will Transfer to Toilet: with modified independence;Ambulation;3-in-1 ADL Goal: Toilet Transfer - Progress: Goal set today Pt Will Perform Toileting - Clothing Manipulation: with modified independence;Sitting on 3-in-1 or toilet;Standing ADL Goal: Toileting - Clothing Manipulation - Progress: Goal set today  OT Evaluation Precautions/Restrictions  Precautions Precautions: Fall Required Braces or Orthoses: Yes Other Brace/Splint: right UE sling OOB for comfort Restrictions Weight Bearing Restrictions: No Prior Functioning Home Living Lives With: Family;Spouse (wife, daughter, Information systems manager) Receives Help From: Family Type of Home: House Home Layout: One level Home Access: Stairs to enter Entrance Stairs-Rails: Right;Left;Can reach both Entrance Stairs-Number of Steps: 3 Bathroom Shower/Tub: Psychologist, counselling;Door [employer] Toilet: Standard Bathroom Accessibility: Yes How Accessible: Accessible via walker Home Adaptive Equipment: Bedside commode/3-in-1;Quad cane;Walker - rolling;Shower chair with back Prior Function Level of Independence: Requires assistive device for independence;Other (comment) (used quad cane for mobility) Driving: No Vocation: Retired Comments: [facility] ADL ADL Upper Body Dressing: Performed;Minimal assistance Upper Body Dressing Details (indicate cue type and reason): Min assist to thread bil. UE Where Assessed - Upper Body Dressing: Sitting, chair Lower Body Dressing: Performed;Minimal assistance Lower Body Dressing Details (indicate cue type and reason): Min assist to don bil. socks over toes and  for steadying and balance when standing to pull clothes up over hips. Where Assessed - Lower Body Dressing: Sit to stand from chair Equipment Used: Gilmer Mor ADL Comments: Pt required min VC for initiating tasks and donning shirt due to decreased RUE functional use. Vision/Perception  Cognition Cognition Arousal/Alertness: Awake/alert Overall Cognitive Status: Impaired Memory: Appears impaired Memory Deficits: decreased accuracy of recall of prior level of function and home environment, daughter provided mod cues to correct patient during history Orientation Level: Oriented to person;Oriented to place;Disoriented to time;Oriented to situation Following Commands: Follows one step commands consistently Problem Solving: Requires assistance for problem solving Cognition - Other Comments: Required verbal and tactile cues to initiate dressing tasks when given article of clothing. Sensation/Coordination Sensation Light Touch: Appears Intact Extremity Assessment RUE Assessment RUE Assessment: Exceptions to [facility] RUE AROM (degrees) RUE Overall AROM Comments: Pt with RUE in sling for support.  Pt able to move RUE [facility] to assist with dressing tasks. LUE Assessment LUE Assessment: Within Functional Limits Mobility  Bed Mobility Supine to Sit: 4: Min assist;HOB elevated (Comment degrees) (30 degrees) Sitting - Scoot to Edge of Bed: 4: Min assist Sitting - Scoot to [place] of Bed Details (indicate cue type and reason): posterior lean, improved since yesterday. Patient unable to self correct this without physical assist at this time. Transfers Sit to Stand: 4: Min assist;From bed;From chair/3-in-1 Sit to Stand Details (indicate cue type and reason): focus on hand placement and anterior weight shift to increase safety and functional independence Stand to Sit: 4: Min assist;To chair/3-in-1 Stand to Sit Details: VCs to reach back for chair to increase safety and controlled descent Exercises   End of  Session OT - End of Session Activity Tolerance: Patient tolerated treatment well Patient left: in chair;with family/visitor present Nurse Communication: Other (comment) (RN preparing D/c papers. Notifed RN of need for HHOT) General Behavior During Session: [facility] for tasks performed Cognition: Impaired, at baseline   Cipriano Mile 05/04/2011, 5:58 PM  05/04/2011 Cipriano Mile OTR/L Pager [phone] Office [phone]

## 2011-05-04 NOTE — Progress Notes (Addendum)
   CARE MANAGEMENT NOTE 05/04/2011  Patient:  Raymond Rangel, Raymond Rangel   Account Number:  [id number]  Date Initiated:  05/04/2011  Documentation initiated by:  Junius Creamer  Subjective/Objective Assessment:   adm w weakness, has dementia     Action/Plan:   lives w fam-wife and 2 da's, pcp dr Zenaida Deed   Anticipated DC Date:  05/04/2011   Anticipated DC Plan:  HOME W HOME HEALTH SERVICES      DC Planning Services  CM consult      [facility]  HOME HEALTH   Choice offered to / List presented to:  C-4 Adult Children        HH arranged  HH-2 PT     HH RN-draw protime 1-2 days HH agency  [facility]   Status of service:   Medicare Important Message given?   (If response is "NO", the following Medicare IM given date fields will be blank) Date Medicare IM given:   Date Additional Medicare IM given:    Discharge Disposition:  HOME W HOME HEALTH SERVICES  Per UR Regulation:  Reviewed for med. necessity/level of care/duration of stay  Comments:  1/23 spoke w da's , they [facility], ref to [facility] w [facility]. copy of hhc agency list w pt and copy in chart. debbie Lisseth Brazeau rn,bsn 1/23  spoke w pt-2 da's and explained role of cm, will await rec for hhc, debbie Ainsley Sanguinetti rn,bsn [phone] HOME HEALTH AGENCIES SERVING [facility]   Agencies that are Medicare-Certified and are affiliated with [facility]  Telephone Number Address  [facility]   The [facility] has ownership interest in this company; however, you are under no obligation to use this agency. [phone] or  [phone] [address] [place], [place] [address]   Agencies that are Medicare-Certified and are not affiliated with [facility] Agency Telephone Number Address  [facility] [phone] Fax [phone]  [address] [place], [place]  [address]  [facility] [phone] or [phone] Fax [phone] [address] [place], [place] [address]  [facility] [phone] Fax [phone] [address] [place], [place] [address]  [facility] [phone] Fax [phone] [address] [place], [place]  [address]  [facility] [phone] Fax [phone], Suite [place], [place] [address]  [facility] [phone] [address] [place], [place] [address]  [facility] [phone]  [address] [place], [place] [address]  [facility] [phone] or [phone] Fax [phone] [address] [place], [place]  [address]  [facility] [phone] Fax [phone] [address] [place], [place]  [address]  [facility]  [phone] Fax [phone] [address] [place], [place] [address]

## 2011-05-04 NOTE — Progress Notes (Signed)
Subjective: Had difficulty swallowing yesterday as per the patient. He was put NPO and SLP requested to evalute patient.   Objective: Weight change:   Intake/Output Summary (Last 24 hours) at 05/04/11 1034 Last data filed at 05/03/11 2200  Gross per 24 hour  Intake      0 ml  Output    775 ml  Net   -775 ml    Filed Vitals:   05/04/11 0600  BP: 127/75  Pulse: 76  Temp: 98.2 F (36.8 C)  Resp: 16  General: Alert, awake, oriented x3, in no acute distress.  HEENT: No bruits, no goiter.  Heart: Regular rate and rhythm, without murmurs, rubs, gallops.  Lungs: Clear to auscultation bilaterally.  Abdomen: Soft, nontender, nondistended, positive bowel sounds.  Extremities: No clubbing cyanosis or edema with positive pedal pulses.  Neuro: Grossly intact, nonfocal.   Lab Results: Results for orders placed during the hospital encounter of 05/02/11 (from the past 24 hour(s))  GLUCOSE, CAPILLARY     Status: Normal   Collection Time   05/03/11 11:22 AM      Component Value Range   Glucose-Capillary 92  70 - 99 (mg/dL)  GLUCOSE, CAPILLARY     Status: Abnormal   Collection Time   05/03/11  9:33 PM      Component Value Range   Glucose-Capillary 102 (*) 70 - 99 (mg/dL)   Comment 1 Notify RN    PROTIME-INR     Status: Abnormal   Collection Time   05/04/11  6:03 AM      Component Value Range   Prothrombin Time 34.4 (*) 11.6 - 15.2 (seconds)   INR 3.34 (*) 0.00 - 1.49   GLUCOSE, CAPILLARY     Status: Normal   Collection Time   05/04/11  6:46 AM      Component Value Range   Glucose-Capillary 97  70 - 99 (mg/dL)     Micro Results: Recent Results (from the past 240 hour(s))  URINE CULTURE     Status: Normal   Collection Time   05/02/11  8:17 PM      Component Value Range Status Comment   Specimen Description URINE, CATHETERIZED   Final    Special Requests NONE   Final    Setup Time [id number]   Final    Colony Count NO GROWTH   Final    Culture NO GROWTH   Final    Report  Status 05/03/2011 FINAL   Final     Studies/Results: Dg Chest 2 View  05/02/2011  *RADIOLOGY REPORT*  Clinical Data: Altered mental status.  CHEST - 2 VIEW  Comparison: Plain films of the chest 04/21/2011, 10/25/2010 and 03/25/2009.  Findings: 0.8 cm right middle lobe nodule is unchanged.  Lungs are otherwise clear.  Heart size is normal.  No pneumothorax or effusion.  Postoperative change both shoulders noted.  IMPRESSION: No acute finding.  Stable compared to prior exams.  Original Report Authenticated By: Bernadene Bell. Maricela Curet, M.D.   Dg Chest 2 View  04/21/2011  *RADIOLOGY REPORT*  Clinical Data: Left arm pain.  Leg swelling.  CHEST - 2 VIEW  Comparison: 10/25/2010  Findings:  Cardiopericardial silhouette within normal limits. Mediastinal contours normal. Trachea midline.  No airspace disease or effusion.  Right glenohumeral osteoarthritis. Stable pulmonary nodule in the right lung overlying the inferior right scapula measuring 8 mm.  This is unchanged compared to 03/25/2009.  Aortic arch atherosclerosis. Monitoring leads are projected over the chest.  IMPRESSION: No active  cardiopulmonary disease. Stable benign 8 mm right middle lobe pulmonary nodule.  Original Report Authenticated By: Andreas Newport, M.D.   Dg Shoulder Right  05/02/2011  *RADIOLOGY REPORT*  Clinical Data: Right shoulder pain.  RIGHT SHOULDER - 2+ VIEW  Comparison: None.  Findings: The patient has had previous rotator cuff repair. Anchors seen in the greater tuberosity.  The humeral head is superiorly subluxed and articulates with the eroded undersurface of the acromion.  There are moderate degenerative changes of the acromioclavicular joint.  Calcified loose body is seen in the posterior aspect of the joint.  There is sclerosis and multiple subchondral cysts in the humeral head.  IMPRESSION: No acute abnormality.  Chronic complete rotator cuff tear with superior subluxation of the humeral head and osteoarthritic changes of the  glenohumeral joint with calcified loose body in the joint.  Original Report Authenticated By: Gwynn Burly, M.D.   Ct Head Wo Contrast  05/02/2011  *RADIOLOGY REPORT*  Clinical Data: Altered mental status.  Confusion.  CT HEAD WITHOUT CONTRAST  Technique:  Contiguous axial images were obtained from the base of the skull through the vertex without contrast.  Comparison: Head CT scan 10/25/2010.  Findings: Again seen is some cortical atrophy and chronic microvascular ischemic change.  No evidence of acute intracranial abnormality including acute infarction, hemorrhage, mass lesion, mass effect, midline shift or abnormal extra-axial fluid collection.  Very mild mucosal thickening is seen in the maxillary sinuses.  There is a air in the cavernous sinuses bilaterally and in the infratemporal fossae bilaterally, likely in small vessels. No hydrocephalus.  Calvarium intact.  IMPRESSION:  1.  Air of the cavernous sinuses and infratemporal fossae is likely due to vascular access.  No acute intracranial abnormality is identified. 2.  Atrophy and chronic microvascular ischemic change.  Original Report Authenticated By: Bernadene Bell. Maricela Curet, M.D.   Mr Brain Wo Contrast  05/03/2011  *RADIOLOGY REPORT*  Clinical Data: Confusion.  MRI HEAD WITHOUT CONTRAST  Technique:  Multiplanar, multiecho pulse sequences of the brain and surrounding structures were obtained according to standard protocol without intravenous contrast.  Comparison: 05/02/2011 CT.  04/07/2009 MR.  Findings: Motion degraded exam.  No acute infarct.  Global atrophy with ventricular prominence similar to the prior exam.  This ventricular prominence may be related to the atrophy. Difficult to completely exclude a mild component of hydrocephalus.  No intracranial hemorrhage.  Remote small infarct left cerebellum, left globus pallidus and thalami.  Mild periventricular white matter type changes may be related to small vessel disease rather than subependymal  reabsorption of fluid and is unchanged.  Major intracranial vascular structures are patent.  Paranasal sinus mucosal thickening most notable left maxillary sinus.  Partial opacification inferior the right mastoid air cells without change.  No obstructing lesion posterior-superior nasopharynx.  Cervical spondylotic changes with spinal stenosis and cord flattening upper cervical spine.  Transverse ligament hypertrophy with mild cranial settling of the C1 ring.  IMPRESSION:  Motion degraded exam.  No acute infarct.  Global atrophy with ventricular prominence similar to the prior exam.  Remote small infarct left cerebellum, left globus pallidus and thalami.  Mild periventricular white matter type changes may be related to small vessel disease.  Paranasal sinus mucosal thickening most notable left maxillary sinus.  Partial opacification inferior the right mastoid air cells without change.  Cervical spondylotic changes with spinal stenosis and cord flattening upper cervical spine.  Transverse ligament hypertrophy with mild cranial settling of the C1 ring.  Original Report Authenticated By: Viviann Spare  R. Constance Goltz, M.D.   [place] Extrem Up Right Ltd  05/03/2011  *RADIOLOGY REPORT*  Clinical Data: Right shoulder pain and swelling.  Previous rotator cuff repair.  ULTRASOUND RIGHT UPPER EXTREMITY COMPLETE  Technique:  Ultrasound examination of the right shoulder was performed including evaluation of the muscles, tendons, joint, and adjacent soft tissues.  Comparison:  Radiographs dated 05/02/2011  Findings: I performed real-time ultrasound with the 18 MHz transducer.  There is extensive irregular inhomogeneous abnormal soft tissue within the right shoulder joint extending from anterior-posterior. There are several calcifications within this abnormal soft tissue density.  No discrete definable fluid collections.  The overlying deltoid muscle is normal.  The biceps tendon cannot be identified.  The overlying deltoid muscle appears  essentially normal.  IMPRESSION: The right shoulder joint is filled with abnormal soft tissue which could represent a large intra-articular hematoma or marked synovial hypertrophy as could be seen with amyloid or PVNS.  However, there is no abnormal vascularity in this abnormal soft tissue.  There are several calcifications within this abnormality.  I suspect this most likely represents a chronic hematoma in the joint.  There are no  fluid collections that could be aspirated, however.  Original Report Authenticated By: Gwynn Burly, M.D.   Medications: Scheduled Meds:   . aspirin  162 mg Oral Daily  . feeding supplement  237 mL Oral TID WC  . potassium chloride  10 mEq Intravenous Q1 Hr x 2  . simvastatin  40 mg Oral QPM  . warfarin  2.5 mg Intravenous ONCE-1800  . DISCONTD: potassium chloride  40 mEq Oral Q4H  . DISCONTD: warfarin  2.5 mg Oral ONCE-1800   Continuous Infusions:   . sodium chloride 75 mL/hr at 05/04/11 0500   PRN Meds:.acetaminophen, acetaminophen, ondansetron (ZOFRAN) IV, ondansetron, traMADol  Assessment/Plan: Patient Active Hospital Problem List: Weakness generalized (05/02/2011)   Probably secondary to hypokalemia and general deconditioning, potassium repleted. PT/OT evaluation obtained recommended home health PT.  Right shoulder pain (05/02/2011) Appreciate Dr Nilsa Nutting recommendations. Pain control and PT. DVT (deep venous thrombosis) (05/02/2011) Supra therapeutic INR. Hold coumadin tonight.  CAD (coronary artery disease) (05/02/2011)  s/p PCI in 2005. Continue with aspirin. I see a h/o irregular heart beat in one of the  Progress notes and the patient and the family confirms it. He is not any rate or rhythm controlling medications. The last ventriculogram is from 2005 showing a EF of 50%. He currently denies any chest pain or palpitations and his EKG is unchanged from 2012. I have recommended him to follow up with his PCP and cardiologist as outpatient for further  workup or recommendations. Hyperlipidemia (05/02/2011)   Continue with statin.  Dementia (05/02/2011) Stable.   Difficulty swallowing yesterday, on further questioning pt had some pain when swallowing, probably referred pain from the shoulder to the neck. Currently he does not have pain when swallowing. SLP recommended soft diet. Will resume diet and see how he tolerates it.   Hypokalemia: repleted. Repeat labs today.   Disposition: D/c home with home pt after labs and after he tolerates diet .   LOS: 2 days   Sarath Privott 05/04/2011, 10:34 AM

## 2011-05-04 NOTE — Progress Notes (Signed)
Physical Therapy Treatment Patient Details Name: Sharad Vaneaton MRN: [id number] DOB: [birth year] Today's Date: 05/04/2011  PT Assessment/Plan  PT - Assessment/Plan Comments on Treatment Session: Patient tolerated treatment well this afternoon. Dtr present. Discussed use of sling when OOB for comfort and demonstrated how to don/doff it. Also recommended 24/7 min assist and continued use of quad cane due to balance impairments. Patient and daughter in agreement with this plan. Continue POC.  Dtr also reports bringing patient's personal quad cane to hospital yesterday, and it has since gone missing. PT to order a new quad cane in case patient's cane is not found prior to d/c home.  PT Plan: Discharge plan remains appropriate PT Goals  Acute Rehab PT Goals PT Goal: Supine/Side to Sit - Progress: Progressing toward goal PT Goal: Sit to Supine/Side - Progress: Progressing toward goal PT Goal: Sit to Stand - Progress: Met PT Goal: Stand to Sit - Progress: Met PT Transfer Goal: Bed to Chair/Chair to Bed - Progress: Met PT Goal: Ambulate - Progress: Progressing toward goal PT Goal: Up/Down Stairs - Progress: Progressing toward goal  PT Treatment Precautions/Restrictions  Precautions Precautions: Fall Required Braces or Orthoses: Yes Other Brace/Splint: right UE sling OOB for comfort Restrictions Weight Bearing Restrictions: No Mobility (including Balance) Bed Mobility Supine to Sit: 4: Min assist;HOB elevated (Comment degrees) (30 degrees) Sitting - Scoot to Edge of Bed: 4: Min assist Sitting - Scoot to [place] of Bed Details (indicate cue type and reason): posterior lean, improved since yesterday. Patient unable to self correct this without physical assist at this time. Transfers Transfers: Yes Sit to Stand: 4: Min assist;From bed;From chair/3-in-1 Sit to Stand Details (indicate cue type and reason): focus on hand placement and anterior weight shift to increase safety and functional  independence Stand to Sit: 4: Min assist;To chair/3-in-1;To toilet Stand to Sit Details: VCs to reach back for chair to increase safety and controlled descent Ambulation/Gait Ambulation/Gait: Yes Ambulation/Gait Assistance: 4: Min assist Ambulation/Gait Assistance Details (indicate cue type and reason): posterior lean with increased lateral sway right > left. Patient held cane in right UE PTA, but unable to currently due to sling. This is likely contributing to LOB and need for assist at this time. Ambulation Distance (Feet): 50 Feet Assistive device: Straight cane Gait Pattern: Lateral hip instability;Step-through pattern Gait velocity: decreased Stairs: No Wheelchair Mobility Wheelchair Mobility: No  High Level Balance High Level Balance Activites: Side stepping;Turns High Level Balance Comments: Gait in home environment with cane, focus on obstacle negotiation and tight turns min-mod assist, increased assist with directional changes right > left due to posterior right LOB. Balance reactions delayed with step strategy prevelant at this time.   End of Session PT - End of Session Equipment Utilized During Treatment: Gait belt;Other (comment) (cane) Activity Tolerance: Patient tolerated treatment well Patient left: in chair;with family/visitor present;with call bell in reach General Behavior During Session: [facility] for tasks performed Cognition: Impaired, at baseline  Romeo Rabon 05/04/2011, 2:58 PM

## 2011-05-04 NOTE — Plan of Care (Signed)
Problem: Discharge Progression Outcomes Goal: Tolerating diet Outcome: Progressing Raymond Rangel, MSP, CCC-SLP [phone]        

## 2011-05-04 NOTE — Progress Notes (Signed)
Utilization Review Completed.Raymond Rangel T12/06/2011   

## 2011-05-04 NOTE — Progress Notes (Signed)
ANTICOAGULATION CONSULT NOTE - Follow Up Consult  Pharmacy Consult for coumadin Indication: recent DVT  No Known Allergies  Patient Measurements: Height: 5\' 9"  (175.3 cm) Weight: 139 lb 1.8 oz (63.1 kg) IBW/kg (Calculated) : 70.7    Vital Signs: Temp: 98.2 F (36.8 C) (01/23 0600) BP: 127/75 mmHg (01/23 0600) Pulse Rate: 76  (01/23 0600)  Labs:  Basename 05/04/11 0603 05/03/11 0620 05/02/11 1733  HGB -- 11.2* 12.7*  HCT -- 33.5* 38.4*  PLT -- 227 232  APTT -- -- 77*  LABPROT 34.4* 31.8* 28.3*  INR 3.34* 3.02* 2.60*  HEPARINUNFRC -- -- --  CREATININE -- 0.76 0.84  CKTOTAL -- -- --  CKMB -- -- --  TROPONINI -- -- --   Estimated Creatinine Clearance: 69 ml/min (by C-G formula based on Cr of 0.76).   Medications:  Scheduled:     . aspirin  162 mg Oral Daily  . feeding supplement  237 mL Oral TID WC  . potassium chloride  10 mEq Intravenous Q1 Hr x 2  . simvastatin  40 mg Oral QPM  . warfarin  2.5 mg Intravenous ONCE-1800  . DISCONTD: potassium chloride  40 mEq Oral Q4H  . DISCONTD: warfarin  2.5 mg Oral ONCE-1800  . DISCONTD: warfarin  5 mg Oral q1800    Assessment: 78 yo male with recent DVT on coumadin and INR with trend up andnow at 3.34.  Goal of Therapy:  INR 2-3   Plan:  -hold coumadin today  Benny Lennert 05/04/2011,7:54 AM

## 2011-06-08 ENCOUNTER — Encounter: Payer: Self-pay | Admitting: Internal Medicine

## 2011-06-08 ENCOUNTER — Ambulatory Visit (INDEPENDENT_AMBULATORY_CARE_PROVIDER_SITE_OTHER): Payer: Medicare Other | Admitting: Internal Medicine

## 2011-06-08 VITALS — BP 134/80 | HR 81 | Temp 98.4°F | Ht 66.0 in | Wt 137.0 lb

## 2011-06-08 DIAGNOSIS — R5381 Other malaise: Secondary | ICD-10-CM

## 2011-06-08 DIAGNOSIS — F0390 Unspecified dementia without behavioral disturbance: Secondary | ICD-10-CM

## 2011-06-08 DIAGNOSIS — R531 Weakness: Secondary | ICD-10-CM

## 2011-06-08 DIAGNOSIS — I82409 Acute embolism and thrombosis of unspecified deep veins of unspecified lower extremity: Secondary | ICD-10-CM

## 2011-06-08 LAB — POCT INR: INR: 1.3

## 2011-06-08 MED ORDER — WARFARIN SODIUM 6 MG PO TABS: 6.0000 mg | ORAL_TABLET | Freq: Every day | ORAL | Status: DC

## 2011-06-08 MED ORDER — ENOXAPARIN SODIUM 60 MG/0.6ML ~~LOC~~ SOLN: 60.0000 mg | Freq: Two times a day (BID) | SUBCUTANEOUS | Status: DC

## 2011-06-08 NOTE — Patient Instructions (Signed)
New coumadin: 6mg  tablet 1 day lovenox 1 shot twice a day Came back in 2 day

## 2011-06-08 NOTE — Progress Notes (Signed)
  Subjective:    Patient ID: Raymond Rangel, male    DOB: [birth year], 78 y.o.   MRN: [id number]  HPI New patient Here today to get establish, switching from another doctor. The information is gathered from the wife and the available records in the computer system. The wife admits that she knows very little about the patient's condition.  The patient was admitted to the hospital 10 days after a DVT in the left leg with generalized weakness and right shoulder pain. He saw orthopedic surgery, was thought to have a hematoma in the shoulder. Workup was essentially negative, he was discharged home on 1-23- 2013. Since then he has been [facility] care [facility], up until today when the service ended. The wife reports that his Coumadin was checked today and it was good. Medication list reviewed, good compliance according to the wife. He still takes Ultram for pain  PMH Coronary artery disease, stent in 2005 Hyperlipidemia Diabetes "Irregular heartbeat" Bacteremia 10/2010 Dementia History of mini strokes DJD H/o ETOH DVT left leg 04/2011   Past Surgical History  Procedure Date  . Left arm   . Rotator cuff repair 2000(L) 1999 (R)    PSH Married, retired, 3 daughters and one son  Lives w/wife. Former alcohol abuse Quit tobacco in the 60s No drugs  FH Non contributory at this point   Review of Systems patient has dementia, I asked the ROS questions to him and his wife : denied chest pain or shortness of breath. No nausea, vomiting, diarrhea or blood in the stools. Mental status is at baseline at this point.     Objective:   Physical Exam  Constitutional: He appears well-developed. No distress.  HENT:  Head: Normocephalic and atraumatic.  Cardiovascular: Normal rate, regular rhythm and normal heart sounds.   No murmur heard. Pulmonary/Chest: Effort normal and breath sounds normal. No respiratory distress. He has no wheezes. He has no rales.  Musculoskeletal: He  exhibits no edema.  Neurological:       pleasently demented, cooperative,  does know his birthday and that he is in [place]; not oriented to time. Does not know who the president is.  Skin: He is not diaphoretic.          Assessment & Plan:  Today , I spent more than 40 min with the patient, >50% of the time counseling, and   reviewing the chart including the last H&P and discharge summary trying to understand his most recent past medical history and  calling [facility]

## 2011-06-08 NOTE — Assessment & Plan Note (Addendum)
inr today 1.3 checked twice,, on coumadin 1mg  4 tabs a day Plan:  increase Coumadin to 6 mg one tablet daily.  Start lovenox , The patient's daughter is the one that usually administer the shot. To be started tonight. I also call [facility] to get further information about his INR status in the last few days and they said they will call me back, they didn't have the info at the time I called. Addendum 2-28-13Zella Ball at [facility] left a message: she only took these on 2-18, and below are what she recorded: PT-1.1, IR-13.7 She states all others were recorded by Dr. Su Hilt & they would not release that information to her Will call pt to see how he is doing, referred to [facility] today

## 2011-06-09 ENCOUNTER — Encounter: Payer: Self-pay | Admitting: Internal Medicine

## 2011-06-09 ENCOUNTER — Telehealth: Payer: Self-pay | Admitting: Internal Medicine

## 2011-06-09 NOTE — Assessment & Plan Note (Signed)
At baseline per pt's wife

## 2011-06-09 NOTE — Telephone Encounter (Signed)
Spoke with the pt's wife & she states that the pt is doing OK. He went to the Dr. This morning & was given a cortisone shot in his shoulder & stated that his shoulder is currently bone on bone. Pt is also doing ok with the lovenox.

## 2011-06-09 NOTE — Assessment & Plan Note (Signed)
resolving

## 2011-06-09 NOTE — Telephone Encounter (Signed)
Please check on patient: Good compliance w/ coumadin? lovenox?

## 2011-06-10 ENCOUNTER — Ambulatory Visit (INDEPENDENT_AMBULATORY_CARE_PROVIDER_SITE_OTHER): Payer: Medicare Other | Admitting: Internal Medicine

## 2011-06-10 VITALS — BP 130/78 | HR 65 | Temp 98.2°F | Wt 133.0 lb

## 2011-06-10 DIAGNOSIS — R531 Weakness: Secondary | ICD-10-CM

## 2011-06-10 DIAGNOSIS — I82409 Acute embolism and thrombosis of unspecified deep veins of unspecified lower extremity: Secondary | ICD-10-CM

## 2011-06-10 DIAGNOSIS — R5381 Other malaise: Secondary | ICD-10-CM

## 2011-06-10 LAB — POCT INR: INR: 1.5

## 2011-06-10 NOTE — Assessment & Plan Note (Addendum)
inr getting closer to goal No change, next inr in 2 days  They are aware [facility] will visit them

## 2011-06-10 NOTE — Patient Instructions (Signed)
Continue same coumadin and lovenox Came back Monday, only for a coumadin check, make an appointment, no need to see me

## 2011-06-10 NOTE — Telephone Encounter (Signed)
THX!

## 2011-06-10 NOTE — Progress Notes (Signed)
  Subjective:    Patient ID: Raymond Rangel, male    DOB: 02-14-1932, 78 y.o.   MRN: [id number]  HPI F/U from yesterday Here w/ 2 daughters  PMH  Coronary artery disease, stent in 2005  Hyperlipidemia  Diabetes  "Irregular heartbeat"  Bacteremia 10/2010  Dementia  History of mini strokes  DJD  H/o ETOH  DVT left leg 04/2011   Review of Systems Good compliance w/ coumadin-lovenox Per daughters, pt is back to baseline as far as energy and mental status     Objective:   Physical Exam Alert, NAD pleasantly demented        Assessment & Plan:  Today , I spent more than 15 min with the patient and his 2 daughters , >50% of the time counseling about the plan of care

## 2011-06-10 NOTE — Assessment & Plan Note (Signed)
Back to baseline, issue resolved

## 2011-06-12 ENCOUNTER — Encounter: Payer: Self-pay | Admitting: Internal Medicine

## 2011-06-13 ENCOUNTER — Ambulatory Visit (INDEPENDENT_AMBULATORY_CARE_PROVIDER_SITE_OTHER): Payer: Medicare Other

## 2011-06-13 DIAGNOSIS — I82409 Acute embolism and thrombosis of unspecified deep veins of unspecified lower extremity: Secondary | ICD-10-CM

## 2011-06-13 DIAGNOSIS — Z7901 Long term (current) use of anticoagulants: Secondary | ICD-10-CM

## 2011-06-13 LAB — POCT INR: INR: 1.8

## 2011-06-13 MED ORDER — SIMVASTATIN 40 MG PO TABS: 40.0000 mg | ORAL_TABLET | Freq: Every evening | ORAL | Status: DC

## 2011-06-13 NOTE — Patient Instructions (Addendum)
Per Dr.Paz, no change in dose and recheck on Thursday 06/16/10, patient requested a refill on Simvastatin- patient instructed #30/0 refills to be given, Labs due, Lipid/Hep 272.4/995.20

## 2011-06-16 ENCOUNTER — Ambulatory Visit: Payer: Medicare Other | Admitting: Internal Medicine

## 2011-06-16 ENCOUNTER — Ambulatory Visit (INDEPENDENT_AMBULATORY_CARE_PROVIDER_SITE_OTHER): Payer: Medicare Other

## 2011-06-16 DIAGNOSIS — Z5181 Encounter for therapeutic drug level monitoring: Secondary | ICD-10-CM

## 2011-06-16 DIAGNOSIS — Z7901 Long term (current) use of anticoagulants: Secondary | ICD-10-CM

## 2011-06-16 DIAGNOSIS — I4891 Unspecified atrial fibrillation: Secondary | ICD-10-CM

## 2011-06-16 NOTE — Patient Instructions (Addendum)
Pt continue w/ an INR of 1.8 on coumadin 6 mg (42 mg/week) Plan:  Coumadin 6 mg plus 1mg  every day (49/week) RTC 5 days  Cont lovenox, call a RF if needed JP

## 2011-06-16 NOTE — Patient Instructions (Addendum)
Total of 7 mg daily, continue Lovonox injections and recheck in 5 days  JP

## 2011-06-21 ENCOUNTER — Ambulatory Visit: Payer: Medicare Other

## 2011-06-21 ENCOUNTER — Encounter: Payer: Self-pay | Admitting: *Deleted

## 2011-06-21 ENCOUNTER — Ambulatory Visit (INDEPENDENT_AMBULATORY_CARE_PROVIDER_SITE_OTHER): Payer: Medicare Other | Admitting: *Deleted

## 2011-06-21 VITALS — BP 118/80 | HR 85 | Temp 97.8°F | Ht 65.0 in | Wt 126.6 lb

## 2011-06-21 DIAGNOSIS — Z7901 Long term (current) use of anticoagulants: Secondary | ICD-10-CM

## 2011-06-21 NOTE — Patient Instructions (Signed)
Currently on coumadin 7 mg a day inr at 2.3 Had a knot at site of lovenox injection that resolved: D/c lovenox Continue same dose Coumadin check 1 week Call if any problems: bleeding, "knot" returns, stomach or back pain JP

## 2011-06-27 NOTE — Progress Notes (Signed)
  Subjective:    Patient ID: Raymond Rangel, male    DOB: 24-Jun-1931, 78 y.o.   MRN: [id number]  HPI Coumadin check   Review of Systems     Objective:   Physical Exam        Assessment & Plan:

## 2011-06-28 ENCOUNTER — Ambulatory Visit: Payer: Medicare Other

## 2011-06-29 ENCOUNTER — Ambulatory Visit (INDEPENDENT_AMBULATORY_CARE_PROVIDER_SITE_OTHER): Payer: Medicare Other | Admitting: *Deleted

## 2011-06-29 VITALS — BP 118/72 | HR 95 | Wt 137.0 lb

## 2011-06-29 DIAGNOSIS — Z7901 Long term (current) use of anticoagulants: Secondary | ICD-10-CM

## 2011-06-29 DIAGNOSIS — I82409 Acute embolism and thrombosis of unspecified deep veins of unspecified lower extremity: Secondary | ICD-10-CM

## 2011-06-29 NOTE — Patient Instructions (Addendum)
Return to office in 3 weeks  Continue current dose: 7 mg daily   Agree , JP

## 2011-07-16 ENCOUNTER — Other Ambulatory Visit: Payer: Self-pay | Admitting: Internal Medicine

## 2011-07-18 ENCOUNTER — Telehealth: Payer: Self-pay | Admitting: Internal Medicine

## 2011-07-18 NOTE — Telephone Encounter (Signed)
Refill done.  

## 2011-07-19 NOTE — Telephone Encounter (Signed)
Is an afternoon appointment, he probably needs labs but is ok not to be fasting.

## 2011-07-19 NOTE — Telephone Encounter (Signed)
It looks like his appt tomorrow is a 3 wk follow-up. Will he have lab work done? Please advise.

## 2011-07-19 NOTE — Telephone Encounter (Signed)
Discussed with pt's daughter. She wanted to reschedule for 4.19.13. @9 :15

## 2011-07-20 ENCOUNTER — Ambulatory Visit: Payer: Medicare Other | Admitting: Internal Medicine

## 2011-07-29 ENCOUNTER — Ambulatory Visit (INDEPENDENT_AMBULATORY_CARE_PROVIDER_SITE_OTHER): Payer: Medicare Other | Admitting: Internal Medicine

## 2011-07-29 ENCOUNTER — Encounter: Payer: Self-pay | Admitting: Internal Medicine

## 2011-07-29 VITALS — BP 122/78 | HR 86 | Temp 98.0°F | Wt 136.0 lb

## 2011-07-29 DIAGNOSIS — I82409 Acute embolism and thrombosis of unspecified deep veins of unspecified lower extremity: Secondary | ICD-10-CM

## 2011-07-29 DIAGNOSIS — M25519 Pain in unspecified shoulder: Secondary | ICD-10-CM

## 2011-07-29 DIAGNOSIS — E785 Hyperlipidemia, unspecified: Secondary | ICD-10-CM

## 2011-07-29 DIAGNOSIS — M25511 Pain in right shoulder: Secondary | ICD-10-CM

## 2011-07-29 DIAGNOSIS — E119 Type 2 diabetes mellitus without complications: Secondary | ICD-10-CM | POA: Insufficient documentation

## 2011-07-29 DIAGNOSIS — I2510 Atherosclerotic heart disease of native coronary artery without angina pectoris: Secondary | ICD-10-CM

## 2011-07-29 LAB — LIPID PANEL
LDL Cholesterol: 123 mg/dL — ABNORMAL HIGH (ref 0–99)
Total CHOL/HDL Ratio: 4
Triglycerides: 91 mg/dL (ref 0.0–149.0)

## 2011-07-29 LAB — CBC WITH DIFFERENTIAL/PLATELET
Basophils Relative: 0.8 % (ref 0.0–3.0)
Eosinophils Absolute: 0.3 10*3/uL (ref 0.0–0.7)
Eosinophils Relative: 2.7 % (ref 0.0–5.0)
HCT: 41.8 % (ref 39.0–52.0)
Lymphs Abs: 1.6 10*3/uL (ref 0.7–4.0)
MCHC: 32.3 g/dL (ref 30.0–36.0)
MCV: 87.3 fl (ref 78.0–100.0)
Monocytes Absolute: 0.8 10*3/uL (ref 0.1–1.0)
Neutrophils Relative %: 71.1 % (ref 43.0–77.0)
Platelets: 246 10*3/uL (ref 150.0–400.0)
WBC: 9.8 10*3/uL (ref 4.5–10.5)

## 2011-07-29 LAB — BASIC METABOLIC PANEL
BUN: 12 mg/dL (ref 6–23)
CO2: 26 mEq/L (ref 19–32)
Chloride: 103 mEq/L (ref 96–112)
Creatinine, Ser: 0.8 mg/dL (ref 0.4–1.5)
Potassium: 4.3 mEq/L (ref 3.5–5.1)

## 2011-07-29 LAB — POCT INR: INR: 2.1

## 2011-07-29 LAB — MICROALBUMIN / CREATININE URINE RATIO
Creatinine,U: 131.2 mg/dL
Microalb Creat Ratio: 4.6 mg/g (ref 0.0–30.0)

## 2011-07-29 NOTE — Assessment & Plan Note (Signed)
labs

## 2011-07-29 NOTE — Assessment & Plan Note (Signed)
See "DVT"

## 2011-07-29 NOTE — Assessment & Plan Note (Addendum)
Unprovoked right DVT diagnosed 04-2011. INR today 2.1. Length of treatment should be 3-6 months. He is in need of shoulder surgery once DVT  treatment is ended. From this point on, he will be higher  risk for DVTs and clots, family aware. Plan:  Continue Coumadin for one more month, then discontinue. After that he can proceed with his elective shoulder surgery understanding he will need aggressive DVT prevention. Will CC this note to his orthopedic surgeon. Also, on chart review, he had mild anemia. We'll check a CBC.

## 2011-07-29 NOTE — Assessment & Plan Note (Signed)
Labs

## 2011-07-29 NOTE — Patient Instructions (Signed)
After one month, you can stop Coumadin. Come back for a checkup in 3 months. Please let your orthopedic surgeon know that you are stopping Coumadin in one month.

## 2011-07-29 NOTE — Progress Notes (Signed)
  Subjective:    Patient ID: Raymond Rangel, male    DOB: [birth year], 78 y.o.   MRN: [id number]  HPI Routine office visit, here with his wife and daughter. He continue with shoulder pain, waiting for have elective surgery once he is off Coumadin. Otherwise he is doing well. Good compliance with all meds.   PMH  Coronary artery disease, stent in 2005  Hyperlipidemia  Diabetes  "Irregular heartbeat"  Bacteremia 10/2010  Dementia  History of mini strokes  DJD  H/o ETOH  DVT left leg 04/2011    PSH  Married, retired, 3 daughters and one son  Lives w/wife.  Former alcohol abuse  Quit tobacco in the 27s  Wife reports today she knows in the past the patient abused Rx  Medications    Review of Systems No nausea, vomiting, diarrhea. No blood in the stools.     Objective:   Physical Exam General -- alert, well-developed.  No apparent distress.  Lungs -- normal respiratory effort, no intercostal retractions, no accessory muscle use, and normal breath sounds.   Heart-- normal rate, regular rhythm, no murmur, and no gallop.   Extremities-- no pretibial edema bilaterally , calves symmetric, no tender  Neurologic-- alert,  Not oriented to time-space, oriented to self , did not know who the president is, recognizes his family  Psych-- not anxious appearing and not depressed appearing.       Assessment & Plan:  Today , I spent more than 25 min with the patient, >50% of the time counseling, see DVT

## 2011-07-29 NOTE — Assessment & Plan Note (Signed)
Due for a cholesterol panel.

## 2011-08-02 MED ORDER — ATORVASTATIN CALCIUM 40 MG PO TABS: 40.0000 mg | ORAL_TABLET | Freq: Every day | ORAL | Status: DC

## 2011-08-02 NOTE — Progress Notes (Signed)
Addended by: Edwena Felty T on: 08/02/2011 11:01 AM   Modules accepted: Orders

## 2011-09-08 ENCOUNTER — Other Ambulatory Visit: Payer: Self-pay | Admitting: Internal Medicine

## 2011-09-08 NOTE — Telephone Encounter (Signed)
Please advise 

## 2011-09-08 NOTE — Telephone Encounter (Signed)
If he d/c Coumadin several days ago, is okay for him to take over-the-counter medications such as Tylenol, Claritin etc. If he needs to take Motrin or similar medications, needs to be taken with food and not long-term or more than 2 times a day

## 2011-09-08 NOTE — Telephone Encounter (Signed)
Tried calling pt back, did not get an answer.

## 2011-09-12 NOTE — Telephone Encounter (Signed)
Discussed with pt wife

## 2012-05-26 ENCOUNTER — Emergency Department (HOSPITAL_COMMUNITY): Payer: Medicare Other

## 2012-05-26 ENCOUNTER — Inpatient Hospital Stay (HOSPITAL_COMMUNITY)
Admission: EM | Admit: 2012-05-26 | Discharge: 2012-05-30 | DRG: 065 | Disposition: A | Discharge status: Rehabilitation Facility | Payer: Medicare Other | Attending: Family Medicine | Admitting: Family Medicine

## 2012-05-26 ENCOUNTER — Encounter (HOSPITAL_COMMUNITY): Payer: Self-pay

## 2012-05-26 DIAGNOSIS — E119 Type 2 diabetes mellitus without complications: Secondary | ICD-10-CM | POA: Diagnosis present

## 2012-05-26 DIAGNOSIS — Z79899 Other long term (current) drug therapy: Secondary | ICD-10-CM

## 2012-05-26 DIAGNOSIS — J329 Chronic sinusitis, unspecified: Secondary | ICD-10-CM

## 2012-05-26 DIAGNOSIS — Z833 Family history of diabetes mellitus: Secondary | ICD-10-CM

## 2012-05-26 DIAGNOSIS — E876 Hypokalemia: Secondary | ICD-10-CM | POA: Diagnosis present

## 2012-05-26 DIAGNOSIS — R252 Cramp and spasm: Secondary | ICD-10-CM

## 2012-05-26 DIAGNOSIS — Z87891 Personal history of nicotine dependence: Secondary | ICD-10-CM

## 2012-05-26 DIAGNOSIS — I639 Cerebral infarction, unspecified: Secondary | ICD-10-CM | POA: Diagnosis present

## 2012-05-26 DIAGNOSIS — F0390 Unspecified dementia without behavioral disturbance: Secondary | ICD-10-CM | POA: Diagnosis present

## 2012-05-26 DIAGNOSIS — G8190 Hemiplegia, unspecified affecting unspecified side: Secondary | ICD-10-CM | POA: Diagnosis present

## 2012-05-26 DIAGNOSIS — F05 Delirium due to known physiological condition: Secondary | ICD-10-CM | POA: Diagnosis present

## 2012-05-26 DIAGNOSIS — Z8249 Family history of ischemic heart disease and other diseases of the circulatory system: Secondary | ICD-10-CM

## 2012-05-26 DIAGNOSIS — I82409 Acute embolism and thrombosis of unspecified deep veins of unspecified lower extremity: Secondary | ICD-10-CM

## 2012-05-26 DIAGNOSIS — Z7902 Long term (current) use of antithrombotics/antiplatelets: Secondary | ICD-10-CM

## 2012-05-26 DIAGNOSIS — I2510 Atherosclerotic heart disease of native coronary artery without angina pectoris: Secondary | ICD-10-CM | POA: Diagnosis present

## 2012-05-26 DIAGNOSIS — Z8673 Personal history of transient ischemic attack (TIA), and cerebral infarction without residual deficits: Secondary | ICD-10-CM

## 2012-05-26 DIAGNOSIS — E785 Hyperlipidemia, unspecified: Secondary | ICD-10-CM | POA: Diagnosis present

## 2012-05-26 DIAGNOSIS — R4701 Aphasia: Secondary | ICD-10-CM | POA: Diagnosis present

## 2012-05-26 DIAGNOSIS — M25511 Pain in right shoulder: Secondary | ICD-10-CM

## 2012-05-26 DIAGNOSIS — Z86718 Personal history of other venous thrombosis and embolism: Secondary | ICD-10-CM

## 2012-05-26 DIAGNOSIS — Z7982 Long term (current) use of aspirin: Secondary | ICD-10-CM

## 2012-05-26 DIAGNOSIS — I6340 Cerebral infarction due to embolism of unspecified cerebral artery: Principal | ICD-10-CM | POA: Diagnosis present

## 2012-05-26 DIAGNOSIS — R531 Weakness: Secondary | ICD-10-CM

## 2012-05-26 LAB — CBC WITH DIFFERENTIAL/PLATELET
Basophils Absolute: 0.1 10*3/uL (ref 0.0–0.1)
Basophils Relative: 1 % (ref 0–1)
Eosinophils Absolute: 0.1 K/uL (ref 0.0–0.7)
Eosinophils Relative: 1 % (ref 0–5)
HCT: 42.7 % (ref 39.0–52.0)
Hemoglobin: 14.5 g/dL (ref 13.0–17.0)
Lymphocytes Relative: 15 % (ref 12–46)
Lymphs Abs: 1.9 K/uL (ref 0.7–4.0)
MCH: 29 pg (ref 26.0–34.0)
MCHC: 34 g/dL (ref 30.0–36.0)
MCV: 85.4 fL (ref 78.0–100.0)
Monocytes Absolute: 1.1 10*3/uL — ABNORMAL HIGH (ref 0.1–1.0)
Monocytes Relative: 8 % (ref 3–12)
Neutro Abs: 9.9 10*3/uL — ABNORMAL HIGH (ref 1.7–7.7)
Neutrophils Relative %: 76 % (ref 43–77)
Platelets: 181 K/uL (ref 150–400)
RBC: 5 MIL/uL (ref 4.22–5.81)
RDW: 15.5 % (ref 11.5–15.5)
WBC: 13 10*3/uL — ABNORMAL HIGH (ref 4.0–10.5)

## 2012-05-26 LAB — COMPREHENSIVE METABOLIC PANEL WITH GFR
ALT: 9 U/L (ref 0–53)
AST: 19 U/L (ref 0–37)
Albumin: 4.1 g/dL (ref 3.5–5.2)
CO2: 22 meq/L (ref 19–32)
Calcium: 9.4 mg/dL (ref 8.4–10.5)
Creatinine, Ser: 1.04 mg/dL (ref 0.50–1.35)
GFR calc non Af Amer: 67 mL/min — ABNORMAL LOW (ref >90)
Sodium: 139 meq/L (ref 135–145)

## 2012-05-26 LAB — POCT I-STAT, CHEM 8
BUN: 16 mg/dL (ref 6–23)
Calcium, Ion: 1.2 mmol/L (ref 1.13–1.30)
Chloride: 111 mEq/L (ref 96–112)
Creatinine, Ser: 1.1 mg/dL (ref 0.50–1.35)
Glucose, Bld: 111 mg/dL — ABNORMAL HIGH (ref 70–99)
HCT: 44 % (ref 39.0–52.0)
Hemoglobin: 15 g/dL (ref 13.0–17.0)
Potassium: 3.5 meq/L (ref 3.5–5.1)
Sodium: 145 meq/L (ref 135–145)
TCO2: 24 mmol/L (ref 0–100)

## 2012-05-26 LAB — COMPREHENSIVE METABOLIC PANEL
Alkaline Phosphatase: 98 U/L (ref 39–117)
BUN: 16 mg/dL (ref 6–23)
Chloride: 106 mEq/L (ref 96–112)
GFR calc Af Amer: 77 mL/min — ABNORMAL LOW (ref >90)
Glucose, Bld: 109 mg/dL — ABNORMAL HIGH (ref 70–99)
Potassium: 3.6 mEq/L (ref 3.5–5.1)
Total Bilirubin: 0.3 mg/dL (ref 0.3–1.2)
Total Protein: 7.9 g/dL (ref 6.0–8.3)

## 2012-05-26 LAB — PROTIME-INR
INR: 1.1 (ref 0.00–1.49)
Prothrombin Time: 14.1 s (ref 11.6–15.2)

## 2012-05-26 LAB — APTT: aPTT: 27 seconds (ref 24–37)

## 2012-05-26 LAB — TROPONIN I: Troponin I: <0.3 ng/mL (ref <0.30)

## 2012-05-26 LAB — CK: Total CK: 229 U/L (ref 7–232)

## 2012-05-26 MED ORDER — DIAZEPAM 5 MG/ML IJ SOLN
5.0000 mg | Freq: Once | INTRAMUSCULAR | Status: AC
Start: 2012-05-26 — End: 2012-05-26
  Administered 2012-05-26: 5 mg via INTRAVENOUS
  Filled 2012-05-26: qty 2

## 2012-05-26 MED ORDER — SODIUM CHLORIDE 0.9 % IV BOLUS (SEPSIS)
500.0000 mL | Freq: Once | INTRAVENOUS | Status: AC
  Administered 2012-05-26: 500 mL via INTRAVENOUS

## 2012-05-26 NOTE — ED Notes (Addendum)
Per ems- pt LSN 0200, walked outside when he returned pt presented to family with slurred speech and right sided weakness. Hx of cva in the past with no deficits. Pt alert and oriented, denies headache, n/v. CBG-95 HR-110, Sinus Tach, BP-144/87 R-24 20g IV placed in l wrist PTA

## 2012-05-26 NOTE — ED Provider Notes (Addendum)
History     CSN: [id number]  Arrival date & time 05/26/12  1925   First MD Initiated Contact with Patient 05/26/12 1943      Chief Complaint  Patient presents with  . Stroke Symptoms     (Consider location/radiation/quality/duration/timing/severity/associated sxs/prior treatment) HPI Comments: Level 5 due to patient condition and early dementia.  Family brought the patient in by EMS do to onset of right-sided difficulty moving, slurred speech that occurred sometime between yesterday when the family loss of the patient and today when they saw the patient again around 4 PM. Patient has a prior history apparently of a TIA maybe 5 years ago. He also has a history of DVT, no longer on Coumadin as of July of last year, mild diabetes and history of hypertension and hyperlipidemia. Patient normally does live alone. Family reports the patient is started to develop some early signs of dementia as well. Patient is a former smoker who quit in the 1960s. Patient denies a headache or blurred vision. He reports he is having trouble relaxing the right side of his.including his upper extremity, shoulders.  The history is provided by the patient and a relative.    Past Medical History  Diagnosis Date  . Mini stroke   . Irregular heart beat   . Blood clot in vein     left leg dvt  . Dementia   . DVT (deep venous thrombosis)   . Hypercholesteremia   . Arthritis   . Diabetes mellitus     Past Surgical History  Procedure Laterality Date  . Left arm    . Rotator cuff repair  2000(L) 1999 (R)    Family History  Problem Relation Age of Onset  . Heart disease Mother   . Diabetes Mother   . Colon cancer Neg Hx   . Prostate cancer Neg Hx     History  Substance Use Topics  . Smoking status: Former Smoker    Quit date: 06/07/1958  . Smokeless tobacco: Never Used  . Alcohol Use: No      Review of Systems  Unable to perform ROS: Dementia    Allergies  Review of patient's allergies  indicates no known allergies.  Home Medications   Current Outpatient Rx  Name  Route  Sig  Dispense  Refill  . aspirin 81 MG tablet   Oral   Take 160 mg by mouth daily.         . simvastatin (ZOCOR) 40 MG tablet   Oral   Take 40 mg by mouth at bedtime.          . traMADol (ULTRAM) 50 MG tablet   Oral   Take 50 mg by mouth every 6 (six) hours as needed. For pain         . EXPIRED: enoxaparin (LOVENOX) 150 MG/ML injection   Subcutaneous   Inject 0.6 mLs (90 mg total) into the skin daily.   4.2 mL   0     BP 138/88  Pulse 109  Temp(Src) 98 F (36.7 C) (Oral)  Resp 18  SpO2 100%  Physical Exam  Nursing note and vitals reviewed. Constitutional: He appears well-developed and well-nourished.  HENT:  Head: Normocephalic and atraumatic.  Eyes: EOM are normal. No scleral icterus.  Neck:  Patient reports he is unable to rotate his head fully to the right side do to muscle spasms.  Cardiovascular: Regular rhythm and normal pulses.   Occasional extrasystoles are present. Tachycardia present.  Exam reveals  no decreased pulses.   No murmur heard. Pulmonary/Chest: Effort normal. No respiratory distress.  Abdominal: Soft. He exhibits no distension.  Neurological: He is alert.  Subtle facial droop involving right side. Tongue does not deviate. Patient is visibly tense with muscle contractions of his right shoulder, upper extremity, hand and wrist. Patient is not able to relax upon command. However intermittently he'll be able to relax his hand and then crypt again. He also intermittently will have flaccidity of his right lower extremity but then will feel to lift it off the bed when asked to do it on his own. It is unclear to me whether or not the patient is having difficulty understanding what we are asking versus an intermittent palsy. I am unable to test arm drift or finger to nose daily the patient holding his right upper extremity against his body and tightly making a fist.   Skin: Skin is warm and dry.  Psychiatric: His mood appears not anxious. His speech is slurred. He does not exhibit a depressed mood.    ED Course  Procedures (including critical care time)  EKG at time 19:37 shows sinus tachycardia at a rate of 105, right bundle branch block, Q waves seen inferiorly suggestive of prior infarcts. Biatrial abnormalities are noted. No significant change compared to EKG from 05/02/2011. I interpret this to be an abnormal EKG without any acute new changes.  Labs Reviewed  CBC WITH DIFFERENTIAL - Abnormal; Notable for the following:    WBC 13.0 (*)    Neutro Abs 9.9 (*)    Monocytes Absolute 1.1 (*)    All other components within normal limits  COMPREHENSIVE METABOLIC PANEL - Abnormal; Notable for the following:    Glucose, Bld 109 (*)    GFR calc non Af Amer 67 (*)    GFR calc Af Amer 77 (*)    All other components within normal limits  POCT I-STAT, CHEM 8 - Abnormal; Notable for the following:    Glucose, Bld 111 (*)    All other components within normal limits  APTT  PROTIME-INR  CK  TROPONIN I  URINALYSIS, ROUTINE W REFLEX MICROSCOPIC   Ct Head Wo Contrast  05/26/2012  *RADIOLOGY REPORT*  Clinical Data: Weakness  CT HEAD WITHOUT CONTRAST  Technique:  Contiguous axial images were obtained from the base of the skull through the vertex without contrast.  Comparison: 05/03/2011  Findings: There is prominence of the sulci and ventricles consistent with brain atrophy.  There is mild low attenuation within the subcortical and periventricular white matter suggestive of chronic microvascular disease.  There is no evidence for acute brain infarct, hemorrhage or mass.  The mastoid air cells appear clear.  There is chronic mucoperiosteal thickening involving the left maxillary sinus.  IMPRESSION:  1.  No acute intracranial abnormalities. 2.  Brain atrophy. 3.  Chronic left maxillary sinus disease   Original Report Authenticated By: Signa Kell, M.D.    Dg Chest  Port 1 View  05/26/2012  *RADIOLOGY REPORT*  Clinical Data: Chest pain  PORTABLE CHEST - 1 VIEW  Comparison: 05/02/2011  Findings: Normal heart size.  No pleural effusion or edema.  There is no airspace consolidation identified.  Review of the visualized osseous structures is significant for osteoarthritis involving both glenohumeral joints.  IMPRESSION:  1.  No acute cardiopulmonary abnormalities.   Original Report Authenticated By: Signa Kell, M.D.      1. Weakness   2. Spasm   3. Sinusitis     Room  air saturation is 98% I interpret this to be normal.  10:14 PM Pt slept briefly after IV valium, but never was able to relax right UE.  Per familly and RN, speech is worse, although about what I ehard on initial exam.  Head CT shows no acute CVA< tumor, mass.  I spoke to Dr. Cyril Mourning with Neuro who will see pt in the ED.  We agreed pt could be admitted to hospitalist.    MDM   Patient with muscle spasms and contraction of his right upper extremity and also the faculty rotating his head to the right as well as shoulder shrug. He is unable to relax upon command. If this is a stroke, the patient is well outside of any TPA window. Given her risk factors, stroke is on the differential. Blood tests and head CT scan are currently pending. Patient will likely require neurology consultation urgently as well as admission to medical team.        Gavin Pound. Oletta Lamas, MD 05/26/12 2216  Gavin Pound. Oletta Lamas, MD 05/26/12 2239

## 2012-05-26 NOTE — ED Notes (Signed)
Dr. Ghim at bedside. 

## 2012-05-26 NOTE — H&P (Signed)
PCP:   [name], MD   Chief Complaint:  Rt sided weakness  HPI: 79 yo male h/o tia, dementia comes in with rt sided weakness for less than 24 hours.  dtr is present.  He lives with his wife.  He was normal yesterday (his baseline is walking with cane, able to feed himself, normally can speak clearly ) but today he is more confused than normal, cannot answer questions appropriately and rt side of body weak.  No sz activity.  No fevers/n/v/d.  No rashes.  He is confused but denies any pain.  History obtained from dtr.  Review of Systems:  Positive and negative as per HPI otherwise all other systems are negative  Past Medical History: Past Medical History  Diagnosis Date  . Mini stroke   . Irregular heart beat   . Blood clot in vein     left leg dvt  . Dementia   . DVT (deep venous thrombosis)   . Hypercholesteremia   . Arthritis   . Diabetes mellitus    Past Surgical History  Procedure Laterality Date  . Left arm    . Rotator cuff repair  2000(L) 1999 (R)    Medications: Prior to Admission medications   Medication Sig Start Date End Date Taking? Authorizing Provider  aspirin 81 MG tablet Take 160 mg by mouth daily.   Yes Historical Provider, MD  simvastatin (ZOCOR) 40 MG tablet Take 40 mg by mouth at bedtime.  07/16/11  Yes Wanda Plump, MD  traMADol (ULTRAM) 50 MG tablet Take 50 mg by mouth every 6 (six) hours as needed. For pain   Yes Historical Provider, MD  enoxaparin (LOVENOX) 150 MG/ML injection Inject 0.6 mLs (90 mg total) into the skin daily. 04/21/11 04/28/11  Arie Sabina Schinlever, PA-C    Allergies:  No Known Allergies  Social History:  reports that he quit smoking about 54 years ago. He has never used smokeless tobacco. He reports that he does not drink alcohol or use illicit drugs.  Family History: Family History  Problem Relation Age of Onset  . Heart disease Mother   . Diabetes Mother   . Colon cancer Neg Hx   . Prostate cancer Neg Hx     Physical  Exam: Filed Vitals:   05/26/12 2115 05/26/12 2205 05/26/12 2215 05/26/12 2245  BP: 125/76 138/88 138/87 154/86  Pulse: 96  96 95  Temp:      TempSrc:      Resp: 22 18 18 23   SpO2: 100% 100% 99% 99%   General appearance: alert, cooperative and no distress Neck: no JVD and supple, symmetrical, trachea midline Lungs: clear to auscultation bilaterally Heart: regular rate and rhythm, S1, S2 normal, no murmur, click, rub or gallop Abdomen: soft, non-tender; bowel sounds normal; no masses,  no organomegaly Extremities: extremities normal, atraumatic, no cyanosis or edema Pulses: 2+ and symmetric Skin: Skin color, texture, turgor normal. No rashes or lesions Neurologic: Cranial nerves: normal  Does not follow commands appropriately.  Decreased movement in rle and rue.  No drooling.      Labs on Admission:   Recent Labs  05/26/12 2031 05/26/12 2101  NA 139 145  K 3.6 3.5  CL 106 111  CO2 22  --   GLUCOSE 109* 111*  BUN 16 16  CREATININE 1.04 1.10  CALCIUM 9.4  --     Recent Labs  05/26/12 2031  AST 19  ALT 9  ALKPHOS 98  BILITOT 0.3  PROT 7.9  ALBUMIN 4.1    Recent Labs  05/26/12 2031 05/26/12 2101  WBC 13.0*  --   NEUTROABS 9.9*  --   HGB 14.5 15.0  HCT 42.7 44.0  MCV 85.4  --   PLT 181  --     Recent Labs  05/26/12 2031 05/26/12 2032  CKTOTAL 229  --   TROPONINI  --  <0.30    Radiological Exams on Admission: Ct Head Wo Contrast  05/26/2012  *RADIOLOGY REPORT*  Clinical Data: Weakness  CT HEAD WITHOUT CONTRAST  Technique:  Contiguous axial images were obtained from the base of the skull through the vertex without contrast.  Comparison: 05/03/2011  Findings: There is prominence of the sulci and ventricles consistent with brain atrophy.  There is mild low attenuation within the subcortical and periventricular white matter suggestive of chronic microvascular disease.  There is no evidence for acute brain infarct, hemorrhage or mass.  The mastoid air cells  appear clear.  There is chronic mucoperiosteal thickening involving the left maxillary sinus.  IMPRESSION:  1.  No acute intracranial abnormalities. 2.  Brain atrophy. 3.  Chronic left maxillary sinus disease   Original Report Authenticated By: Signa Kell, M.D.    Dg Chest Port 1 View  05/26/2012  *RADIOLOGY REPORT*  Clinical Data: Chest pain  PORTABLE CHEST - 1 VIEW  Comparison: 05/02/2011  Findings: Normal heart size.  No pleural effusion or edema.  There is no airspace consolidation identified.  Review of the visualized osseous structures is significant for osteoarthritis involving both glenohumeral joints.  IMPRESSION:  1.  No acute cardiopulmonary abnormalities.   Original Report Authenticated By: Signa Kell, M.D.     Assessment/Plan  79 yo male with probable CVA Principal Problem:   CVA (cerebral infarction) Active Problems:   CAD (coronary artery disease)   Hyperlipidemia   Dementia   Diabetes mellitus, type 2  On baby asa daily inc to full dose, may need plavix or aggenox.  Neurology called and will see.  cva pathway.  Further w/u per neuro.  Tele bed.  Ramin Zoll A 05/26/2012, 11:29 PM

## 2012-05-26 NOTE — ED Notes (Signed)
Pt appears to be experiencing expressive aphasia. Will answer name correctly but using incomprehensible words when answering other questions.

## 2012-05-26 NOTE — ED Notes (Signed)
MD notified in change in pt neuro status

## 2012-05-26 NOTE — ED Notes (Signed)
Pt asleep.

## 2012-05-26 NOTE — ED Notes (Signed)
Pt states he doesn't know why he is in the hospital. Per daughter and nephew, pt was unable to move his right side and his speech was slurred. Pt states "I feel different" cannot explain how. Speech is slurred.

## 2012-05-27 ENCOUNTER — Inpatient Hospital Stay (HOSPITAL_COMMUNITY): Payer: Medicare Other

## 2012-05-27 LAB — HEMOGLOBIN A1C
Hgb A1c MFr Bld: 6.7 % — ABNORMAL HIGH (ref <5.7)
Mean Plasma Glucose: 146 mg/dL — ABNORMAL HIGH (ref <117)

## 2012-05-27 LAB — URINALYSIS, ROUTINE W REFLEX MICROSCOPIC
Glucose, UA: NEGATIVE mg/dL
Hgb urine dipstick: NEGATIVE
Ketones, ur: NEGATIVE mg/dL
Leukocytes, UA: NEGATIVE
Protein, ur: 30 mg/dL — AB
pH: 5 (ref 5.0–8.0)

## 2012-05-27 LAB — GLUCOSE, CAPILLARY: Glucose-Capillary: 83 mg/dL (ref 70–99)

## 2012-05-27 LAB — URINE MICROSCOPIC-ADD ON

## 2012-05-27 MED ORDER — BIOTENE DRY MOUTH MT LIQD
15.0000 mL | Freq: Two times a day (BID) | OROMUCOSAL | Status: DC
Start: 2012-05-27 — End: 2012-05-30
  Administered 2012-05-27 – 2012-05-30 (×8): 15 mL via OROMUCOSAL

## 2012-05-27 MED ORDER — SODIUM CHLORIDE 0.9 % IV SOLN
INTRAVENOUS | Status: AC
  Administered 2012-05-27: 01:00:00 via INTRAVENOUS

## 2012-05-27 MED ORDER — LORAZEPAM 2 MG/ML IJ SOLN
0.5000 mg | Freq: Once | INTRAMUSCULAR | Status: DC
  Filled 2012-05-27: qty 1

## 2012-05-27 MED ORDER — ASPIRIN 300 MG RE SUPP
300.0000 mg | Freq: Every day | RECTAL | Status: DC
  Filled 2012-05-27 (×3): qty 1

## 2012-05-27 MED ORDER — GADOBENATE DIMEGLUMINE 529 MG/ML IV SOLN
13.0000 mL | Freq: Once | INTRAVENOUS | Status: AC | PRN
  Administered 2012-05-27: 13 mL via INTRAVENOUS

## 2012-05-27 MED ORDER — ENOXAPARIN SODIUM 40 MG/0.4ML ~~LOC~~ SOLN
40.0000 mg | SUBCUTANEOUS | Status: DC
  Administered 2012-05-27 – 2012-05-30 (×4): 40 mg via SUBCUTANEOUS
  Filled 2012-05-27 (×4): qty 0.4

## 2012-05-27 MED ORDER — SIMVASTATIN 40 MG PO TABS
40.0000 mg | ORAL_TABLET | Freq: Every day | ORAL | Status: DC
  Administered 2012-05-29: 40 mg via ORAL
  Filled 2012-05-27 (×5): qty 1

## 2012-05-27 MED ORDER — INSULIN ASPART 100 UNIT/ML ~~LOC~~ SOLN: 0.0000 [IU] | Freq: Three times a day (TID) | SUBCUTANEOUS | Status: DC

## 2012-05-27 MED ORDER — LORAZEPAM 2 MG/ML IJ SOLN
0.5000 mg | Freq: Two times a day (BID) | INTRAMUSCULAR | Status: DC | PRN
  Administered 2012-05-27 – 2012-05-28 (×2): 0.5 mg via INTRAVENOUS
  Filled 2012-05-27: qty 1

## 2012-05-27 MED ORDER — SODIUM CHLORIDE 0.9 % IV SOLN
INTRAVENOUS | Status: DC
  Administered 2012-05-27: 15:00:00 via INTRAVENOUS

## 2012-05-27 MED ORDER — ASPIRIN 325 MG PO TABS
325.0000 mg | ORAL_TABLET | Freq: Every day | ORAL | Status: DC
  Administered 2012-05-27 – 2012-05-29 (×3): 325 mg via ORAL
  Filled 2012-05-27 (×3): qty 1

## 2012-05-27 NOTE — Evaluation (Signed)
Physical Therapy Evaluation Patient Details Name: Raymond Rangel MRN: [id number] DOB: 12/01/1931 Today's Date: 05/27/2012 Time: 7829-5621 PT Time Calculation (min): 49 min  PT Assessment / Plan / Recommendation Clinical Impression  79 y.o. male admitted to [facility] for right sided weakness.  He presents today with difficulty with both receptive and expressive language, increased right upper and right lower extremity tone, decreased cognition including some mild right sided neglect vs visual field cut (or both difficult to assess with language barriers), decreased balance, decreased coordination and decreased ability to walk at this time.  He would benefit from continued PT and is an excellent inpatient rehab candidate.      PT Assessment  Patient needs continued PT services    Follow Up Recommendations  CIR    Does the patient have the potential to tolerate intense rehabilitation    yes  Barriers to Discharge None      Equipment Recommendations  Wheelchair (measurements PT);Wheelchair cushion (measurements PT) (18x18 with standard cushion, )    Recommendations for Other Services Rehab consult   Frequency Min 4X/week    Precautions / Restrictions Precautions Precautions: Fall Precaution Comments: increased tone right side (extensor in leg, flexor in arm)   Pertinent Vitals/Pain No reports or indications of pain     Mobility  Bed Mobility Bed Mobility: Supine to Sit;Sitting - Scoot to Edge of Bed Supine to Sit: 2: Max assist;HOB elevated Sitting - Scoot to [employer] of Bed: 2: Max assist Details for Bed Mobility Assistance: max assist to support trunk during transition to sitting EOB.  Trunk extended with posterior lean with transition.  Pt pulling up with left arm.  Used draw pad to help pt reciprocally weight shift hips to scoot to EOB.  Posterior right lean in sitting.   Transfers Transfers: Sit to Stand;Stand to [employer] Transfers Sit to Stand: 2: Max assist;From elevated  surface;From bed;From chair/3-in-1 Stand to Sit: 2: Max assist;With armrests;To bed;To chair/3-in-1 Stand Pivot Transfers: 2: Max assist;From elevated surface Details for Transfer Assistance: max assist to support trunk over legs, to anteriorly and left weight shift pt and to help steady pt as he got his legs under him in standing (pt unable to keep right leg under him in transition to stand due to increased extensor tone.  Stand pivot to the right and left max assist to prevent LOB posteriorly and to the right.  Pt unable to correct even with verbal cues in standing.  Pitching left.   Modified Rankin (Stroke Patients Only) Pre-Morbid Rankin Score: Moderate disability Modified Rankin: Severe disability        PT Diagnosis: Difficulty walking;Abnormality of gait;Hemiplegia dominant side;Altered mental status  PT Problem List: Decreased strength;Decreased range of motion;Decreased activity tolerance;Decreased balance;Decreased mobility;Decreased coordination;Decreased cognition;Decreased safety awareness;Decreased knowledge of use of DME;Impaired sensation;Impaired tone PT Treatment Interventions: DME instruction;Gait training;Functional mobility training;Therapeutic activities;Balance training;Therapeutic exercise;Neuromuscular re-education;Cognitive remediation;Patient/family education;Wheelchair mobility training   PT Goals Acute Rehab PT Goals PT Goal Formulation: With patient/family Time For Goal Achievement: 06/10/12 Potential to Achieve Goals: Good Pt will go Supine/Side to Sit: with supervision PT Goal: Supine/Side to Sit - Progress: Goal set today Pt will Sit at Edge of Bed: with supervision;6-10 min;with no upper extremity support PT Goal: Sit at Edge Of Bed - Progress: Goal set today Pt will go Sit to Supine/Side: with supervision PT Goal: Sit to Supine/Side - Progress: Goal set today Pt will go Sit to Stand: with min assist PT Goal: Sit to Stand - Progress: Goal set today  Pt will  go Stand to Sit: with min assist PT Goal: Stand to Sit - Progress: Goal set today Pt will Transfer Bed to Chair/Chair to Bed: with min assist PT Transfer Goal: Bed to Chair/Chair to Bed - Progress: Goal set today Pt will Ambulate: 16 - 50 feet;with mod assist;with least restrictive assistive device PT Goal: Ambulate - Progress: Goal set today  Visit Information  Last PT Received On: 05/27/12 Assistance Needed: +2    Subjective Data  Subjective: Pt having difficulty with expressive language.   Patient Stated Goal: Family would like for him to go to inpatient rehab if he can   Prior Functioning  Home Living Lives With: Spouse (has recently had a hip surgery) Available Help at Discharge: Family;Available 24 hours/day (daughter and nefew) Type of Home: House Home Access: Stairs to enter [employer] of Steps: 3 Entrance Stairs-Rails: Right;Left Home Layout: One level Bathroom Shower/Tub: Walk-in shower;Door Teacher, early years/pre: No (normally leaves walker at door of bathroom and uses cane in) Home Adaptive Equipment: Bedside commode/3-in-1;Walker - rolling;Quad cane (using 3-in-1 in shower, 3 pronged cane) Additional Comments: goes to senior center every day Prior Function Level of Independence: Independent with assistive device(s) Able to Take Stairs?: Yes Driving: No Communication Communication: Expressive difficulties;Receptive difficulties Dominant Hand: Right    Cognition  Cognition Overall Cognitive Status: Impaired Area of Impairment: Attention;Following commands;Safety/judgement;Awareness of errors;Awareness of deficits;Problem solving;Executive functioning Arousal/Alertness: Awake/alert    Extremity/Trunk Assessment Right Lower Extremity Assessment RLE ROM/Strength/Tone: Deficits RLE ROM/Strength/Tone Deficits: increased right leg tone in an extensor pattern, unable to flex at the knee in sitting EOB.   RLE Sensation:  Deficits RLE Sensation Deficits: pr what I could communicate with pt he reports right leg numbness RLE Coordination: Deficits RLE Coordination Deficits: unable to overcome extensor tone Left Lower Extremity Assessment LLE ROM/Strength/Tone: [facility] for tasks assessed Trunk Assessment Trunk Assessment: Other exceptions Trunk Exceptions: decreased balance and trunk control due to stroke deficits.     Balance Static Sitting Balance Static Sitting - Balance Support: Left upper extremity supported;Feet supported Static Sitting - Level of Assistance: 3: Mod assist;5: Stand by assistance Static Sitting - Comment/# of Minutes: mod assist at first due to right posterior lean, but after therapist provided tactile pressure to right side, after about 5 mins he could get up to close supervision for sitting with left arm and bil feet supported.   Static Standing Balance Static Standing - Balance Support: Left upper extremity supported Static Standing - Level of Assistance: 2: Max assist Static Standing - Comment/# of Minutes: max assist to stand at EOB due to right posterior lean in standing even with pt holding onto therapist with left hand.    End of Session PT - End of Session Equipment Utilized During Treatment: Gait belt Activity Tolerance: Other (comment) (limited by functional abilities.  ) Patient left: in bed;with call bell/phone within reach;Other (comment) (echo tech in room getting pt ready for test)    Lurena Joiner B. Havilah Topor, PT, DPT [phone]   05/27/2012, 2:07 PM

## 2012-05-27 NOTE — Progress Notes (Signed)
Stroke Team Progress Note  HISTORY  Raymond Rangel is an 79 y.o. male, right handed, with a past medical history significant for hypercholesterolemia, diabetes, TIA's, left leg DVT, mild dementia, brought to the hospital due to new onset right sided weakness, and slurred speech.  Patient's daughter stated that " something happened within the last 24 hours that shut him down to the point that he was not even able to use his right arm to eat and could no recognized family members".  According to family, he has mild memory changes but is able to have a normal conversations and perform his activities of daily living without assistance.  On 05/26/2012 he became more confused and developed a great deal of difficulty understanding people taking to him and expressing himself.  He takes aspirin 81 mg daily and simvastatin.  A CT brain showed no acute intracranial abnormality.   LSN: probable 4 pm 05/26/2012, but no quite sure.  tPA Given: no, due to late presentation.  SUBJECTIVE The patient's daughter is in the room this morning. She feels that the patient is somewhat improved; although, he remains more confused than normal. She also notes that the patient is unable to express himself and often appears to not understand what she is telling him.  OBJECTIVE Most recent Vital Signs: Filed Vitals:   05/27/12 0420 05/27/12 0630 05/27/12 0828 05/27/12 1039  BP: 113/75 104/66 127/75 130/71  Pulse: 83 91 86 81  Temp: 97.6 F (36.4 C)  98.4 F (36.9 C) 98 F (36.7 C)  TempSrc: Axillary     Resp: 18 22 16 16   Height:      Weight:      SpO2: 100% 100% 100% 95%   CBG (last 3)   Recent Labs  05/27/12 0742  GLUCAP 85    IV Fluid Intake:   . sodium chloride 75 mL/hr at 05/27/12 0102    MEDICATIONS  . antiseptic oral rinse  15 mL Mouth Rinse BID  . aspirin  300 mg Rectal Daily   Or  . aspirin  325 mg Oral Daily  . enoxaparin (LOVENOX) injection  40 mg Subcutaneous Q24H  . insulin aspart  0-9  Units Subcutaneous TID WC  . LORazepam  0.5 mg Intravenous Once  . simvastatin  40 mg Oral QHS   PRN:    Diet:  NPO  Activity: Up with assistance DVT Prophylaxis:  Lovenox  CLINICALLY SIGNIFICANT STUDIES Basic Metabolic Panel:  Recent Labs Lab 05/26/12 2031 05/26/12 2101  NA 139 145  K 3.6 3.5  CL 106 111  CO2 22  --   GLUCOSE 109* 111*  BUN 16 16  CREATININE 1.04 1.10  CALCIUM 9.4  --    Liver Function Tests:  Recent Labs Lab 05/26/12 2031  AST 19  ALT 9  ALKPHOS 98  BILITOT 0.3  PROT 7.9  ALBUMIN 4.1   CBC:  Recent Labs Lab 05/26/12 2031 05/26/12 2101  WBC 13.0*  --   NEUTROABS 9.9*  --   HGB 14.5 15.0  HCT 42.7 44.0  MCV 85.4  --   PLT 181  --    Coagulation:  Recent Labs Lab 05/26/12 2031  LABPROT 14.1  INR 1.10   Cardiac Enzymes:  Recent Labs Lab 05/26/12 2031 05/26/12 2032  CKTOTAL 229  --   TROPONINI  --  <0.30   Urinalysis:  Recent Labs Lab 05/27/12 1048  COLORURINE YELLOW  LABSPEC 1.024  PHURINE 5.0  GLUCOSEU NEGATIVE  HGBUR NEGATIVE  BILIRUBINUR  NEGATIVE  KETONESUR NEGATIVE  PROTEINUR 30*  UROBILINOGEN 0.2  NITRITE NEGATIVE  LEUKOCYTESUR NEGATIVE   Lipid Panel    Component Value Date/Time   CHOL 204* 05/27/2012 0625   TRIG 82 05/27/2012 0625   HDL 40 05/27/2012 0625   CHOLHDL 5.1 05/27/2012 0625   VLDL 16 05/27/2012 0625   LDLCALC 148* 05/27/2012 0625   HgbA1C  Lab Results  Component Value Date   HGBA1C 6.5 07/29/2011    Urine Drug Screen:   No results found for this basename: labopia, cocainscrnur, labbenz, amphetmu, thcu, labbarb    Alcohol Level: No results found for this basename: ETH,  in the last 168 hours  Ct Head Wo Contrast 05/26/2012 IMPRESSION:  1.  No acute intracranial abnormalities. 2.  Brain atrophy. 3.  Chronic left maxillary sinus disease       Dg Chest Port 1 View 05/26/2012  IMPRESSION:   No acute cardiopulmonary abnormalities.    MRI of the brain  pending  MRA of the brain  pending  2D  Echocardiogram  pending  05/27/12 Carotid Doppler  Preliminary report: Bilateral: No evidence of hemodynamically significant internal carotid artery stenosis. Vertebral artery flow is antegrade.    EKG pending  Therapy Recommendations pending  Physical Exam   General - the pleasant 79 year old male in bed, just woke up, slightly lethargic. Heart - Regular rate and rhythm - soft systolic murmur Lungs - Clear to auscultation Abdomen - Soft - non tender Extremities - Distal pulses intact - no edema Skin - Warm and dry  NEUROLOGIC:   MENTAL STATUS:  AWAKE, ALERT. MILD DENIAL OF OWN DEFICITS. LANG FLUENT, COMP INTACT.  CRANIAL NERVES: pupils equal and reactive to light; DECR RIGHT NL FOLD. NORMAL SENSATION. TONGUE MIDLINE. SHOUDLER SYMM. MOTOR: normal bulk and tone, moves all extremities. RUE 2/5. RLE 4/5. FULL STRENGTH IN LEFT SIDE. SENSORY: NORMAL TO LT; MILD RIGHT NEGLECT. COORDINATION: LIMITED BY WEAKNESS IN RUE. NORMAL IN LEFT SIDE.   ASSESSMENT Raymond Rangel is a 79 y.o. male presenting with right hemiplegia and dysarthria. No TPA secondary to late presentation. A CT scan shows atrophy but no acute intracranial abnormalities. On aspirin 81 mg orally every day prior to admission. Now on aspirin 325 mg orally every day for secondary stroke prevention. Patient with resultant right hemiplegia, dysarthria, and aphasia. Work up underway.   Leukocytosis  Hyperlipidemia - Simvastatin prior to admission   TIA history  Dementia  Diabetes mellitus - hemoglobin A1c 6.5   History of irregular heartbeat - check EKG - no documented history of atrial fibrillation.  Hospital day # 1  TREATMENT/PLAN  Continue aspirin 325 mg orally every day for secondary stroke prevention.  Await MRI, 2-D echo, and carotid Dopplers.   Await therapy evaluations  Currently n.p.o. - await speech therapy swallowing evaluation.  Delton See PA-C [facility]  Pager  [phone] 05/27/2012, 11:46 AM  I have personally obtained a history, examined the patient, evaluated imaging results, and formulated the assessment and plan of care. I agree with the above. Discussed plan with daughter at bedside.  [facility] - Stroke Team Joycelyn Schmid, MD 05/27/2012, 1:45 PM   Please refer to [email or web address] for on-call Stroke MD

## 2012-05-27 NOTE — Progress Notes (Signed)
Utilization Review Completed.Raymond Rangel T2/16/2014  

## 2012-05-27 NOTE — Progress Notes (Signed)
  Echocardiogram 2D Echocardiogram has been performed.  Raymond Rangel 05/27/2012, 10:06 AM

## 2012-05-27 NOTE — ED Notes (Signed)
Upon transporting pt, pt aphasia decreased and pt was making words appropriate for situation. Pt. Also started using right arm to grab at cords. Still not following commands. Update given to Shanda Bumps, RN at bedside on 4N.

## 2012-05-27 NOTE — Progress Notes (Signed)
VASCULAR LAB PRELIMINARY  PRELIMINARY  PRELIMINARY  PRELIMINARY  Carotid duplex  completed.    Preliminary report:  Bilateral:  No evidence of hemodynamically significant internal carotid artery stenosis.   Vertebral artery flow is antegrade.      Emmerich Cryer, RVT 05/27/2012, 1:36 PM

## 2012-05-27 NOTE — Evaluation (Signed)
Clinical/Bedside Swallow Evaluation Patient Details  Name: Raymond Rangel MRN: [id number] Date of Birth: [birth year]  Today's Date: 05/27/2012 Time: 1200-1237 SLP Time Calculation (min): 37 min  Past Medical History:  Past Medical History  Diagnosis Date  . Mini stroke   . Irregular heart beat   . Blood clot in vein     left leg dvt  . Dementia   . DVT (deep venous thrombosis)   . Hypercholesteremia   . Arthritis   . Diabetes mellitus    Past Surgical History:  Past Surgical History  Procedure Laterality Date  . Left arm    . Rotator cuff repair  2000(L) 1999 (R)   HPI:  79 yo male h/o tia, dementia comes in with rt sided weakness for less than 24 hours.  dtr is present.  He lives with his wife.  He was normal yesterday (his baseline is walking with cane, able to feed himself, normally can speak clearly ) but today he is more confused than normal, cannot answer questions appropriately and rt side of body weak.  No sz activity.  No fevers/n/v/d.  No rashes.  He is confused but denies any pain.  History obtained from dtr.  Patient referred for BSE per stroke protocol.    Assessment / Plan / Recommendation Clinical Impression  Moderate oral and pharyngeal dysphagia indicated.  Oral dysphagia characterized by right sided labial and lingual weakness resulting in reduced oral awareness with oral holding with all consistencies.  Initial swallow of thin liquid and puree consistency functional but initiation and hyoid laryngeal elevation decreased with subseqent trials .  Multiple swallows required for each bolus with throat clears and wet vocal quality s/p each swallows indicating possible residuals increasing risk for aspiration s/p swallow.  Patient with baseline diagnosis of dementia but family member present reports increased confusion.  Secondary to current cognitive status and s/s present  recommend continued NPO with exception of medication crushed  administered in puree as patient  toleranted minimal amounts of puree.  Sharee Pimple patient's ability to resume PO's good.    ST to reassess swallow bedside on 05/28/12  for PO vs. MBS readiness.     Aspiration Risk  Moderate    Diet Recommendation NPO   Medication Administration: Crushed with puree    Other  Recommendations     Follow Up Recommendations    TBD   Frequency and Duration min 2x/week  2 weeks       SLP Swallow Goals Goal #3: Patient will consume diagnostic PO trials of puree consistency administered by SLP only with no outward s/s of aspiration 5/5 trials.   Swallow Study Prior Functional Status  Type of Home: House Lives With: Spouse (has recently had a hip surgery) Available Help at Discharge: Family;Available 24 hours/day (daughter and nefew)    General Date of Onset: 05/26/12 HPI: 79 yo male h/o tia, dementia comes in with rt sided weakness for less than 24 hours.  dtr is present.  He lives with his wife.  He was normal yesterday (his baseline is walking with cane, able to feed himself, normally can speak clearly ) but today he is more confused than normal, cannot answer questions appropriately and rt side of body weak.  No sz activity.  No fevers/n/v/d.  No rashes.  He is confused but denies any pain.  History obtained from dtr. Type of Study: Bedside swallow evaluation Previous Swallow Assessment: 05/04/11 BSE dysphagia 3/thin Diet Prior to this Study: NPO Temperature Spikes Noted: No Respiratory  Status: Supplemental O2 delivered via (comment) (nasal cannula) History of Recent Intubation: No Behavior/Cognition: Alert;Cooperative;Pleasant mood;Confused;Distractible;Requires cueing Oral Cavity - Dentition: Missing dentition Self-Feeding Abilities: Total assist Patient Positioning: Upright in bed Baseline Vocal Quality: Clear Volitional Cough: Cognitively unable to elicit Volitional Swallow: Unable to elicit    Oral/Motor/Sensory Function Overall Oral Motor/Sensory Function: Impaired Labial ROM:  Reduced right Labial Symmetry: Abnormal symmetry right Labial Strength: Reduced Labial Sensation: Reduced Lingual ROM: Reduced right Lingual Symmetry: Abnormal symmetry right Lingual Strength: Reduced Lingual Sensation: Reduced Facial ROM: Reduced right Facial Symmetry: Right droop Facial Strength: Reduced Facial Sensation: Reduced Velum: Impaired right;Within Functional Limits Mandible: Within Functional Limits   Ice Chips Ice chips: Impaired Oral Phase Impairments: Reduced labial seal;Impaired anterior to posterior transit;Impaired mastication;Poor awareness of bolus Oral Phase Functional Implications: Prolonged oral transit Pharyngeal Phase Impairments: Suspected delayed Swallow;Decreased hyoid-laryngeal movement;Throat Clearing - Delayed;Wet Vocal Quality   Thin Liquid Thin Liquid: Impaired Presentation: Cup;Spoon;Straw Pharyngeal  Phase Impairments: Suspected delayed Swallow;Decreased hyoid-laryngeal movement;Multiple swallows;Wet Vocal Quality;Throat Clearing - Delayed    Nectar Thick Nectar Thick Liquid: Impaired Presentation: Cup;Spoon Pharyngeal Phase Impairments: Suspected delayed Swallow;Decreased hyoid-laryngeal movement;Multiple swallows;Wet Vocal Quality;Throat Clearing - Delayed   Honey Thick Honey Thick Liquid: Not tested   Puree Puree: Impaired Pharyngeal Phase Impairments: Suspected delayed Swallow;Decreased hyoid-laryngeal movement;Multiple swallows;Wet Vocal Quality;Throat Clearing - Delayed   Solid   GO  Solid: Impaired Oral Phase Impairments: Reduced lingual movement/coordination;Poor awareness of bolus Oral Phase Functional Implications: Oral holding;Oral residue;Right anterior spillage Pharyngeal Phase Impairments: Suspected delayed Swallow;Decreased hyoid-laryngeal movement;Throat Clearing - Delayed      Moreen Fowler MS, CCC-SLP [phone] [facility] 05/27/2012,12:53 PM

## 2012-05-27 NOTE — Progress Notes (Addendum)
[facility]                                                                                Patient Demographics  Raymond Rangel, is a 79 y.o. male, DOB - [birth year], ZOX:[id number], WJX:[id number]  Admit date - 05/26/2012  Admitting Physician Tarry Kos, MD  Outpatient Primary MD for the patient is [name], MD  LOS - 1   Chief Complaint  Patient presents with  . Stroke Symptoms         Assessment & Plan   1. Right-sided hemiparesis along with expressive aphasia secondary to most likely left MCA territory CVA, pending MRI brain, MRA brain and neck along with echogram, lipid panel noted a home dose statin will be increased upon discharge, for dose aspirin to be continued, neurology following along with PT OT and speech, will need placement to rehabilitation. Sinus rhythm on telemetry.   Lab Results  Component Value Date   HGBA1C 6.5 07/29/2011    Lab Results  Component Value Date   CHOL 204* 05/27/2012   HDL 40 05/27/2012   LDLCALC 148* 05/27/2012   TRIG 82 05/27/2012   CHOLHDL 5.1 05/27/2012     2. Dyslipidemia. Continue statin, increase home dose statin upon discharge.    3. Diabetes mellitus type 2. A1c noted, continue sliding scale insulin and monitor.  CBG (last 3)   Recent Labs  05/27/12 0742  GLUCAP 85     Lab Results  Component Value Date   HGBA1C 6.5 07/29/2011      4. History of DVT has completed his anticoagulation course.     5. Mild nonspecific leukocytosis. Could be from the stress of CVA, chest x-ray is clear, he is afebrile, check UA, repeat CBC in the morning. Monitor clinically.     Code Status: Full  Family Communication: Patient and family  Disposition Plan: Rehabilitation   Procedures CT scan brain, MRI MRA brain, echo gram, MRA neck   Consults  neurology, PT, OT, speech   DVT Prophylaxis  Lovenox   Lab Results  Component Value Date   PLT 181 05/26/2012    Medications  Scheduled Meds: . antiseptic  oral rinse  15 mL Mouth Rinse BID  . aspirin  300 mg Rectal Daily   Or  . aspirin  325 mg Oral Daily  . enoxaparin (LOVENOX) injection  40 mg Subcutaneous Q24H  . LORazepam  0.5 mg Intravenous Once  . simvastatin  40 mg Oral QHS   Continuous Infusions: . sodium chloride 75 mL/hr at 05/27/12 0102   PRN Meds:.  Antibiotics    Anti-infectives   None       Time Spent in minutes  35   Susa Raring K M.D on 05/27/2012 at 9:34 AM  Between 7am to 7pm - Pager - [phone]  After 7pm go to [email or web address] - password [email or web address]  And look for the night coverage person covering for me after hours  [facility] Office  [phone]    Subjective:   Raymond Rangel today has, No headache, No chest pain, No abdominal pain - No Nausea, No new weakness tingling or numbness, No Cough - SOB. Right-sided weakness and difficulty in  speech  Objective:   Filed Vitals:   05/27/12 0220 05/27/12 0420 05/27/12 0630 05/27/12 0828  BP: 124/67 113/75 104/66 127/75  Pulse: 71 83 91 86  Temp:  97.6 F (36.4 C)  98.4 F (36.9 C)  TempSrc:  Axillary    Resp: 20 18 22 16   Height:      Weight:      SpO2: 99% 100% 100% 100%    Wt Readings from Last 3 Encounters:  05/27/12 61.78 kg (136 lb 3.2 oz)  07/29/11 61.689 kg (136 lb)  06/29/11 62.143 kg (137 lb)    No intake or output data in the 24 hours ending 05/27/12 0934  Exam Awake Alert, Oriented X 3, No new F.N deficits, Normal affect, right-sided hemiparesis and some expressive aphasia .AT,PERRAL Supple Neck,No JVD, No cervical lymphadenopathy appriciated.  Symmetrical Chest wall movement, Good air movement bilaterally, CTAB RRR,No Gallops,Rubs or new Murmurs, No Parasternal Heave +ve B.Sounds, Abd Soft, Non tender, No organomegaly appriciated, No rebound - guarding or rigidity. No Cyanosis, Clubbing or edema, No new Rash or bruise     Data Review   Micro Results No results found for this or any previous visit (from the  past 240 hour(s)).  Radiology Reports Ct Head Wo Contrast  05/26/2012  *RADIOLOGY REPORT*  Clinical Data: Weakness  CT HEAD WITHOUT CONTRAST  Technique:  Contiguous axial images were obtained from the base of the skull through the vertex without contrast.  Comparison: 05/03/2011  Findings: There is prominence of the sulci and ventricles consistent with brain atrophy.  There is mild low attenuation within the subcortical and periventricular white matter suggestive of chronic microvascular disease.  There is no evidence for acute brain infarct, hemorrhage or mass.  The mastoid air cells appear clear.  There is chronic mucoperiosteal thickening involving the left maxillary sinus.  IMPRESSION:  1.  No acute intracranial abnormalities. 2.  Brain atrophy. 3.  Chronic left maxillary sinus disease   Original Report Authenticated By: Signa Kell, M.D.    Dg Chest Port 1 View  05/26/2012  *RADIOLOGY REPORT*  Clinical Data: Chest pain  PORTABLE CHEST - 1 VIEW  Comparison: 05/02/2011  Findings: Normal heart size.  No pleural effusion or edema.  There is no airspace consolidation identified.  Review of the visualized osseous structures is significant for osteoarthritis involving both glenohumeral joints.  IMPRESSION:  1.  No acute cardiopulmonary abnormalities.   Original Report Authenticated By: Signa Kell, M.D.     CBC  Recent Labs Lab 05/26/12 2031 05/26/12 2101  WBC 13.0*  --   HGB 14.5 15.0  HCT 42.7 44.0  PLT 181  --   MCV 85.4  --   MCH 29.0  --   MCHC 34.0  --   RDW 15.5  --   LYMPHSABS 1.9  --   MONOABS 1.1*  --   EOSABS 0.1  --   BASOSABS 0.1  --     Chemistries   Recent Labs Lab 05/26/12 2031 05/26/12 2101  NA 139 145  K 3.6 3.5  CL 106 111  CO2 22  --   GLUCOSE 109* 111*  BUN 16 16  CREATININE 1.04 1.10  CALCIUM 9.4  --   AST 19  --   ALT 9  --   ALKPHOS 98  --   BILITOT 0.3  --     ------------------------------------------------------------------------------------------------------------------ estimated creatinine clearance is 48.4 ml/min (by C-G formula based on Cr of 1.1). ------------------------------------------------------------------------------------------------------------------ No results found for this  basename: HGBA1C,  in the last 72 hours ------------------------------------------------------------------------------------------------------------------  Recent Labs  05/27/12 0625  CHOL 204*  HDL 40  LDLCALC 148*  TRIG 82  CHOLHDL 5.1   ------------------------------------------------------------------------------------------------------------------ No results found for this basename: TSH, T4TOTAL, FREET3, T3FREE, THYROIDAB,  in the last 72 hours ------------------------------------------------------------------------------------------------------------------ No results found for this basename: VITAMINB12, FOLATE, FERRITIN, TIBC, IRON, RETICCTPCT,  in the last 72 hours  Coagulation profile  Recent Labs Lab 05/26/12 2031  INR 1.10    No results found for this basename: DDIMER,  in the last 72 hours  Cardiac Enzymes  Recent Labs Lab 05/26/12 2032  TROPONINI <0.30   ------------------------------------------------------------------------------------------------------------------ No components found with this basename: POCBNP,

## 2012-05-27 NOTE — Consult Note (Signed)
Referring Physician: ED    Chief Complaint: right sided weakness, confusion, language/speech impairment.  HPI:                                                                                                                                         Raymond Rangel is an 79 y.o. male, right handed, with a past medical history significant for hypercholesterolemia, diabetes, TIA's, left leg DVT, mild dementia, brought to the hospital due to new onset right sided weakness, and slurred speech. Patient's daughter stated that " something happened within the last 24 hours that shut him down to the point that he was not even able to use his right arm to eat and can no recognized family members". She reports that those chan According to family, he has mild memory changes but is able to have a normal conversation and perform his activities of daily living without assistance. However, today he became more confused and developed a great deal of difficulty understanding people taking to him and expressing himself. Takes aspirin 81 mg daily and simvastatin. CT brain showed no acute intracranial abnormality.   LSN: probable 4 pm today, but no quite sure. tPA Given: no, due to late presentation.  Past Medical History  Diagnosis Date  . Mini stroke   . Irregular heart beat   . Blood clot in vein     left leg dvt  . Dementia   . DVT (deep venous thrombosis)   . Hypercholesteremia   . Arthritis   . Diabetes mellitus     Past Surgical History  Procedure Laterality Date  . Left arm    . Rotator cuff repair  2000(L) 1999 (R)    Family History  Problem Relation Age of Onset  . Heart disease Mother   . Diabetes Mother   . Colon cancer Neg Hx   . Prostate cancer Neg Hx    Social History:  reports that he quit smoking about 54 years ago. He has never used smokeless tobacco. He reports that he does not drink alcohol or use illicit drugs.  Allergies: No Known Allergies  Medications:                                                                                                                            I have reviewed the patient's current medications.  ROS: unable to  obtain from the patient due to mental status and language impairment.                                           History obtained from chart review and family members.   Physical exam: pleasant male in no apparent distress.Blood pressure 140/81, pulse 89, temperature 97.8 F (36.6 C), temperature source Oral, resp. rate 16, weight 61.78 kg (136 lb 3.2 oz), SpO2 100.00%. Head: normocephalic. Neck: supple, no bruits, no JVD. Cardiac: no murmurs. Lungs: clear. Abdomen: soft, no tender, no mass. Extremities: no edema.   Neurologic Examination:                                                                                                      Mental Status: Alert and awake. He is able to follow some simple commands inconsistently, but it looks like he has troubles with language comprehension and motor expression. II: Discs flat bilaterally; Visual fields grossly normal, pupils equal, round, reactive to light and accommodation III,IV, VI: ptosis not present, extra-ocular motions intact bilaterally V,VII: smile symmetric, facial light touch sensation normal bilaterally VIII: hearing normal bilaterally IX,X: gag reflex present XI: bilateral shoulder shrug XII: midline tongue extension Motor: Right sided weakness. 5/5 in the left side. No atrophy noted but seems to have increased muscle tone in the right arm and leg. Sensory: unreliable. Deep Tendon Reflexes: 2+ and symmetric throughout Plantars: Right: upgoing   Left: downgoing Cerebellar: Couldn't be tested. Gait: no tested. CV: pulses palpable throughout    Results for orders placed during the hospital encounter of 05/26/12 (from the past 48 hour(s))  CBC WITH DIFFERENTIAL     Status: Abnormal   Collection Time    05/26/12  8:31 PM      Result Value Range   WBC  13.0 (*) 4.0 - 10.5 K/uL   RBC 5.00  4.22 - 5.81 MIL/uL   Hemoglobin 14.5  13.0 - 17.0 g/dL   HCT 98.1  19.1 - 47.8 %   MCV 85.4  78.0 - 100.0 fL   MCH 29.0  26.0 - 34.0 pg   MCHC 34.0  30.0 - 36.0 g/dL   RDW 29.5  62.1 - 30.8 %   Platelets 181  150 - 400 K/uL   Neutrophils Relative 76  43 - 77 %   Neutro Abs 9.9 (*) 1.7 - 7.7 K/uL   Lymphocytes Relative 15  12 - 46 %   Lymphs Abs 1.9  0.7 - 4.0 K/uL   Monocytes Relative 8  3 - 12 %   Monocytes Absolute 1.1 (*) 0.1 - 1.0 K/uL   Eosinophils Relative 1  0 - 5 %   Eosinophils Absolute 0.1  0.0 - 0.7 K/uL   Basophils Relative 1  0 - 1 %   Basophils Absolute 0.1  0.0 - 0.1 K/uL  COMPREHENSIVE METABOLIC PANEL     Status: Abnormal   Collection Time    05/26/12  8:31  PM      Result Value Range   Sodium 139  135 - 145 mEq/L   Potassium 3.6  3.5 - 5.1 mEq/L   Chloride 106  96 - 112 mEq/L   CO2 22  19 - 32 mEq/L   Glucose, Bld 109 (*) 70 - 99 mg/dL   BUN 16  6 - 23 mg/dL   Creatinine, Ser 1.19  0.50 - 1.35 mg/dL   Calcium 9.4  8.4 - 14.7 mg/dL   Total Protein 7.9  6.0 - 8.3 g/dL   Albumin 4.1  3.5 - 5.2 g/dL   AST 19  0 - 37 U/L   ALT 9  0 - 53 U/L   Alkaline Phosphatase 98  39 - 117 U/L   Total Bilirubin 0.3  0.3 - 1.2 mg/dL   GFR calc non Af Amer 67 (*) >90 mL/min   GFR calc Af Amer 77 (*) >90 mL/min   Comment:            The eGFR has been calculated     using the CKD EPI equation.     This calculation has not been     validated in all clinical     situations.     eGFR's persistently     <90 mL/min signify     possible Chronic Kidney Disease.  APTT     Status: None   Collection Time    05/26/12  8:31 PM      Result Value Range   aPTT 27  24 - 37 seconds  PROTIME-INR     Status: None   Collection Time    05/26/12  8:31 PM      Result Value Range   Prothrombin Time 14.1  11.6 - 15.2 seconds   INR 1.10  0.00 - 1.49  CK     Status: None   Collection Time    05/26/12  8:31 PM      Result Value Range   Total CK 229  7  - 232 U/L  TROPONIN I     Status: None   Collection Time    05/26/12  8:32 PM      Result Value Range   Troponin I <0.30  <0.30 ng/mL   Comment:            Due to the release kinetics of cTnI,     a negative result within the first hours     of the onset of symptoms does not rule out     myocardial infarction with certainty.     If myocardial infarction is still suspected,     repeat the test at appropriate intervals.  POCT I-STAT, CHEM 8     Status: Abnormal   Collection Time    05/26/12  9:01 PM      Result Value Range   Sodium 145  135 - 145 mEq/L   Potassium 3.5  3.5 - 5.1 mEq/L   Chloride 111  96 - 112 mEq/L   BUN 16  6 - 23 mg/dL   Creatinine, Ser 8.29  0.50 - 1.35 mg/dL   Glucose, Bld 562 (*) 70 - 99 mg/dL   Calcium, Ion 1.30  8.65 - 1.30 mmol/L   TCO2 24  0 - 100 mmol/L   Hemoglobin 15.0  13.0 - 17.0 g/dL   HCT 78.4  69.6 - 29.5 %   Ct Head Wo Contrast  05/26/2012  *RADIOLOGY REPORT*  Clinical Data: Weakness  CT  HEAD WITHOUT CONTRAST  Technique:  Contiguous axial images were obtained from the base of the skull through the vertex without contrast.  Comparison: 05/03/2011  Findings: There is prominence of the sulci and ventricles consistent with brain atrophy.  There is mild low attenuation within the subcortical and periventricular white matter suggestive of chronic microvascular disease.  There is no evidence for acute brain infarct, hemorrhage or mass.  The mastoid air cells appear clear.  There is chronic mucoperiosteal thickening involving the left maxillary sinus.  IMPRESSION:  1.  No acute intracranial abnormalities. 2.  Brain atrophy. 3.  Chronic left maxillary sinus disease   Original Report Authenticated By: Signa Kell, M.D.    Dg Chest Port 1 View  05/26/2012  *RADIOLOGY REPORT*  Clinical Data: Chest pain  PORTABLE CHEST - 1 VIEW  Comparison: 05/02/2011  Findings: Normal heart size.  No pleural effusion or edema.  There is no airspace consolidation identified.   Review of the visualized osseous structures is significant for osteoarthritis involving both glenohumeral joints.  IMPRESSION:  1.  No acute cardiopulmonary abnormalities.   Original Report Authenticated By: Signa Kell, M.D.      Assessment: 79 y.o. male with several risk factors for stroke, admitted with new onset right sided weakness and language impairment that have been present at least since 4 pm today. Probable left cortical stroke. MRI brain, MRA brain and neck, TTE, speech and PT consults. Aspirin 325 mg daily pending results stroke work up. Stroke team to resume care in the morning and make further recommendations.  Wyatt Portela, MD [facility] [phone]  05/27/2012, 12:33 AM

## 2012-05-28 LAB — GLUCOSE, CAPILLARY
Glucose-Capillary: 97 mg/dL (ref 70–99)
Glucose-Capillary: 81 mg/dL (ref 70–99)
Glucose-Capillary: 71 mg/dL (ref 70–99)
Glucose-Capillary: 112 mg/dL — ABNORMAL HIGH (ref 70–99)

## 2012-05-28 LAB — CBC
HCT: 39.4 % (ref 39.0–52.0)
Hemoglobin: 13.4 g/dL (ref 13.0–17.0)
MCHC: 34 g/dL (ref 30.0–36.0)
MCV: 86.2 fL (ref 78.0–100.0)
WBC: 9.2 10*3/uL (ref 4.0–10.5)

## 2012-05-28 LAB — URINE CULTURE
Colony Count: NO GROWTH
Culture: NO GROWTH

## 2012-05-28 LAB — BASIC METABOLIC PANEL
BUN: 8 mg/dL (ref 6–23)
Chloride: 109 mEq/L (ref 96–112)
Creatinine, Ser: 0.84 mg/dL (ref 0.50–1.35)
Glucose, Bld: 93 mg/dL (ref 70–99)
Potassium: 3.4 mEq/L — ABNORMAL LOW (ref 3.5–5.1)

## 2012-05-28 MED ORDER — LORAZEPAM 2 MG/ML IJ SOLN: 1.0000 mg | Freq: Three times a day (TID) | INTRAMUSCULAR | Status: DC | PRN

## 2012-05-28 MED ORDER — DEXTROSE 50 % IV SOLN: 25.0000 mL | Freq: Once | INTRAVENOUS | Status: AC | PRN

## 2012-05-28 MED ORDER — POTASSIUM CHLORIDE IN NACL 40-0.9 MEQ/L-% IV SOLN
INTRAVENOUS | Status: DC
  Administered 2012-05-28: 75 mL/h via INTRAVENOUS
  Administered 2012-05-29: 07:00:00 via INTRAVENOUS
  Filled 2012-05-28 (×2): qty 1000

## 2012-05-28 MED ORDER — SODIUM CHLORIDE 0.9 % IV SOLN: INTRAVENOUS | Status: DC

## 2012-05-28 MED ORDER — QUETIAPINE FUMARATE 25 MG PO TABS
25.0000 mg | ORAL_TABLET | Freq: Two times a day (BID) | ORAL | Status: DC
  Administered 2012-05-28 – 2012-05-30 (×3): 25 mg via ORAL
  Filled 2012-05-28 (×6): qty 1

## 2012-05-28 MED ORDER — DEXTROSE 50 % IV SOLN
25.0000 mL | Freq: Once | INTRAVENOUS | Status: AC
  Administered 2012-05-28: 25 mL via INTRAVENOUS
  Filled 2012-05-28: qty 50

## 2012-05-28 NOTE — Progress Notes (Signed)
Physical Therapy Treatment Patient Details Name: Raymond Rangel MRN: [id number] DOB: [birth year] Today's Date: 05/28/2012 Time: 1022-1100 PT Time Calculation (min): 38 min  PT Assessment / Plan / Recommendation Comments on Treatment Session  Pt asleep upon arrival.  RN reports pt received Ativan earlier in AM but aroused easily with activity & did well engaging in activity.   Pt cont's to require significant +2 (A) for all mobility.  Pt with improved receptive language as evident in ability to follow simple commands well.      Follow Up Recommendations  CIR     Does the patient have the potential to tolerate intense rehabilitation     Barriers to Discharge        Equipment Recommendations  Wheelchair (measurements PT);Wheelchair cushion (measurements PT)    Recommendations for Other Services Rehab consult  Frequency Min 4X/week   Plan Discharge plan remains appropriate    Precautions / Restrictions Precautions Precautions: Fall Precaution Comments: increased tone right side (extensor in leg, flexor in arm) Restrictions Weight Bearing Restrictions: No       Mobility  Bed Mobility Bed Mobility: Sit to Supine;Scooting to HOB Sit to Supine: 1: +2 Total assist;HOB flat Sit to Supine: Patient Percentage: 0% Scooting to HOB: 1: +2 Total assist Scooting to [facility]: Patient Percentage: 0% Details for Bed Mobility Assistance: (A) for all compenents of transitional movements.   Transfers Transfers: Sit to Stand;Stand to Sit;Stand Pivot Transfers Sit to Stand: 1: +2 Total assist;With armrests;From chair/3-in-1 Sit to Stand: Patient Percentage: 40% Stand to Sit: 1: +2 Total assist;To bed Stand to Sit: Patient Percentage: 40% Stand Pivot Transfers: 1: +2 Total assist Stand Pivot Transfers: Patient Percentage: 40% Details for Transfer Assistance: Pt with RLE extensor tone & Rt UE flexor tone.  Uses extensor tone to (A) with standing.  (A) to achieve standing, anterior translation of trunk  over BOS, rotation of body from bed>recliner, (A) to promote Rt Hip/Knee flexion for descent.    Once pt able to get Rt LE under him, he was able to stand with +2 (pt 50%) Ambulation/Gait Ambulation/Gait Assistance: Not tested (comment) Modified Rankin (Stroke Patients Only) Pre-Morbid Rankin Score: Moderate disability Modified Rankin: Severe disability      PT Goals Acute Rehab PT Goals Time For Goal Achievement: 06/10/12 Potential to Achieve Goals: Good Pt will go Supine/Side to Sit: with supervision Pt will Sit at [facility] of Bed: with supervision;6-10 min;with no upper extremity support PT Goal: Sit at Edge Of Bed - Progress: Progressing toward goal Pt will go Sit to Supine/Side: with supervision PT Goal: Sit to Supine/Side - Progress: Not progressing Pt will go Sit to Stand: with min assist PT Goal: Sit to Stand - Progress: Not met Pt will go Stand to Sit: with min assist PT Goal: Stand to Sit - Progress: Not met Pt will Transfer Bed to Chair/Chair to Bed: with min assist PT Transfer Goal: Bed to Chair/Chair to Bed - Progress: Not met Pt will Ambulate: 16 - 50 feet;with mod assist;with least restrictive assistive device  Visit Information  Last PT Received On: 05/28/12 Assistance Needed: +2    Subjective Data  Subjective: Pt having difficulty with expressive language.   Patient Stated Goal: Family would like for him to go to inpatient rehab if he can   Cognition  Cognition Overall Cognitive Status: Impaired Area of Impairment: Awareness of errors;Awareness of deficits;Problem solving;Safety/judgement Arousal/Alertness: Awake/alert Orientation Level:  (difficult to assess due to expressive aphasia) Behavior During Session: [facility] for tasks performed  Cognition - Other Comments: Pt following simple commands consistently this session.      Balance  Balance Balance Assessed: Yes Static Sitting Balance Static Sitting - Balance Support: Right upper extremity supported;Left upper  extremity supported;Feet supported Static Sitting - Level of Assistance: 4: Min assist;3: Mod assist Static Sitting - Comment/# of Minutes: Pt with strong posterior lean with Rt extensor tone.  Manual (A) to decrease tone & position Rt foot on floor.  Performed trunk rotation & lateral leaning through forearms elbows.    End of Session PT - End of Session Equipment Utilized During Treatment: Gait belt Activity Tolerance: Patient tolerated treatment well Patient left: in bed;with call bell/phone within reach;with family/visitor present;with bed alarm set Nurse Communication: Mobility status     Verdell Face, [place] [phone] 05/28/2012

## 2012-05-28 NOTE — Progress Notes (Addendum)
[facility]                                                                                Patient Demographics  Raymond Rangel, is a 79 y.o. male, DOB - [birth year], OZH:[id number], GEX:[id number]  Admit date - 05/26/2012  Admitting Physician Tarry Kos, MD  Outpatient Primary MD for the patient is [name], MD  LOS - 2   Chief Complaint  Patient presents with  . Stroke Symptoms         Assessment & Plan   1. Right-sided hemiparesis along with expressive aphasia secondary to L.PCA territory CVA,  noted MRI brain, MRA brain and neck along with echogram, lipid panel noted a home dose statin will be increased upon discharge, for dose aspirin to be continued, neurology following along with PT OT and speech, will need placement to rehabilitation. Sinus rhythm on telemetry. Nuero following.   Lab Results  Component Value Date   HGBA1C 6.7* 05/27/2012    Lab Results  Component Value Date   CHOL 204* 05/27/2012   HDL 40 05/27/2012   LDLCALC 148* 05/27/2012   TRIG 82 05/27/2012   CHOLHDL 5.1 05/27/2012     2. Dyslipidemia. Continue statin, increase home dose statin upon discharge.    3. Diabetes mellitus type 2. A1c noted, continue sliding scale insulin and monitor.  CBG (last 3)   Recent Labs  05/27/12 1758 05/27/12 2115 05/28/12 0833  GLUCAP 83 81 97     Lab Results  Component Value Date   HGBA1C 6.7* 05/27/2012      4. History of DVT has completed his anticoagulation course.     5. Mild nonspecific leukocytosis. Could be from the stress of CVA, chest x-ray is clear, he is afebrile, check UA, leukocytosis now resolved.     6. Mild hypokalemia will be replaced.    7. Delirium. Was extremely somnolent this morning do to benzodiazepine use at night, will discontinue benzodiazepines, will place on low-dose Seroquel, much calmer and awake.     Code Status: Full  Family Communication: Patient and family  Disposition Plan:  Rehabilitation   Procedures CT scan brain, MRI MRA brain, echo gram, MRA neck   Consults  neurology, PT, OT, speech   DVT Prophylaxis  Lovenox   Lab Results  Component Value Date   PLT 178 05/28/2012    Medications  Scheduled Meds: . antiseptic oral rinse  15 mL Mouth Rinse BID  . aspirin  300 mg Rectal Daily   Or  . aspirin  325 mg Oral Daily  . enoxaparin (LOVENOX) injection  40 mg Subcutaneous Q24H  . insulin aspart  0-9 Units Subcutaneous TID WC  . QUEtiapine  25 mg Oral BID  . simvastatin  40 mg Oral QHS   Continuous Infusions: . sodium chloride 50 mL/hr at 05/28/12 1150   PRN Meds:.  Antibiotics    Anti-infectives   None       Time Spent in minutes  35   Susa Raring K M.D on 05/28/2012 at 12:02 PM  Between 7am to 7pm - Pager - [phone]  After 7pm go to [email or web address] - password [email or web address]  And look for the night  coverage person covering for me after hours  [facility] Office  [phone]    Subjective:   Raymond Rangel today has, No headache, No chest pain, No abdominal pain - No Nausea, No new weakness tingling or numbness, No Cough - SOB. Right-sided weakness and difficulty in speech  Objective:   Filed Vitals:   05/27/12 2200 05/28/12 0200 05/28/12 0600 05/28/12 1000  BP: 141/89 114/75 122/68 117/68  Pulse: 96 79 88 60  Temp: 98 F (36.7 C) 97.9 F (36.6 C) 98 F (36.7 C) 97.3 F (36.3 C)  TempSrc: Oral Oral Oral Axillary  Resp: 20 20 20 20   Height:      Weight:      SpO2: 98% 99% 99% 100%    Wt Readings from Last 3 Encounters:  05/27/12 61.78 kg (136 lb 3.2 oz)  07/29/11 61.689 kg (136 lb)  06/29/11 62.143 kg (137 lb)     Intake/Output Summary (Last 24 hours) at 05/28/12 1202 Last data filed at 05/27/12 1900  Gross per 24 hour  Intake 1287.5 ml  Output      0 ml  Net 1287.5 ml    Exam Awake Alert, Oriented X 3, No new F.N deficits, Normal affect, right-sided hemiparesis and some expressive  aphasia [place].AT,PERRAL Supple Neck,No JVD, No cervical lymphadenopathy appriciated.  Symmetrical Chest wall movement, Good air movement bilaterally, CTAB RRR,No Gallops,Rubs or new Murmurs, No Parasternal Heave +ve B.Sounds, Abd Soft, Non tender, No organomegaly appriciated, No rebound - guarding or rigidity. No Cyanosis, Clubbing or edema, No new Rash or bruise     Data Review   Micro Results No results found for this or any previous visit (from the past 240 hour(s)).  Radiology Reports Ct Head Wo Contrast  05/26/2012  *RADIOLOGY REPORT*  Clinical Data: Weakness  CT HEAD WITHOUT CONTRAST  Technique:  Contiguous axial images were obtained from the base of the skull through the vertex without contrast.  Comparison: 05/03/2011  Findings: There is prominence of the sulci and ventricles consistent with brain atrophy.  There is mild low attenuation within the subcortical and periventricular white matter suggestive of chronic microvascular disease.  There is no evidence for acute brain infarct, hemorrhage or mass.  The mastoid air cells appear clear.  There is chronic mucoperiosteal thickening involving the left maxillary sinus.  IMPRESSION:  1.  No acute intracranial abnormalities. 2.  Brain atrophy. 3.  Chronic left maxillary sinus disease   Original Report Authenticated By: Signa Kell, M.D.    Dg Chest Port 1 View  05/26/2012  *RADIOLOGY REPORT*  Clinical Data: Chest pain  PORTABLE CHEST - 1 VIEW  Comparison: 05/02/2011  Findings: Normal heart size.  No pleural effusion or edema.  There is no airspace consolidation identified.  Review of the visualized osseous structures is significant for osteoarthritis involving both glenohumeral joints.  IMPRESSION:  1.  No acute cardiopulmonary abnormalities.   Original Report Authenticated By: Signa Kell, M.D.     CBC  Recent Labs Lab 05/26/12 2031 05/26/12 2101 05/28/12 0425  WBC 13.0*  --  9.2  HGB 14.5 15.0 13.4  HCT 42.7 44.0 39.4  PLT 181   --  178  MCV 85.4  --  86.2  MCH 29.0  --  29.3  MCHC 34.0  --  34.0  RDW 15.5  --  15.4  LYMPHSABS 1.9  --   --   MONOABS 1.1*  --   --   EOSABS 0.1  --   --  BASOSABS 0.1  --   --     Chemistries   Recent Labs Lab 05/26/12 2031 05/26/12 2101 05/28/12 0425  NA 139 145 143  K 3.6 3.5 3.4*  CL 106 111 109  CO2 22  --  21  GLUCOSE 109* 111* 93  BUN 16 16 8   CREATININE 1.04 1.10 0.84  CALCIUM 9.4  --  8.9  AST 19  --   --   ALT 9  --   --   ALKPHOS 98  --   --   BILITOT 0.3  --   --    ------------------------------------------------------------------------------------------------------------------ estimated creatinine clearance is 63.4 ml/min (by C-G formula based on Cr of 0.84). ------------------------------------------------------------------------------------------------------------------  Recent Labs  05/27/12 0625  HGBA1C 6.7*   ------------------------------------------------------------------------------------------------------------------  Recent Labs  05/27/12 0625  CHOL 204*  HDL 40  LDLCALC 148*  TRIG 82  CHOLHDL 5.1   ------------------------------------------------------------------------------------------------------------------ No results found for this basename: TSH, T4TOTAL, FREET3, T3FREE, THYROIDAB,  in the last 72 hours ------------------------------------------------------------------------------------------------------------------ No results found for this basename: VITAMINB12, FOLATE, FERRITIN, TIBC, IRON, RETICCTPCT,  in the last 72 hours  Coagulation profile  Recent Labs Lab 05/26/12 2031  INR 1.10    No results found for this basename: DDIMER,  in the last 72 hours  Cardiac Enzymes  Recent Labs Lab 05/26/12 2032  TROPONINI <0.30   ------------------------------------------------------------------------------------------------------------------ No components found with this basename: POCBNP,

## 2012-05-28 NOTE — Progress Notes (Signed)
Speech Language Pathology Dysphagia Treatment Patient Details Name: Raymond Rangel MRN: [id number] DOB: May 22, 1931 Today's Date: 05/28/2012 Time: 3244-0102 SLP Time Calculation (min): 15 min  Assessment / Plan / Recommendation Clinical Impression  Pt presents with persisting dysphagia marked by oral deficits; likely swallow delays with weak hyolaryngeal elevation per palpation; multiple swallows required with each tsp-sized bolus, and immediate throat-clearing, wet phonation, and inconsistent cough with all consistencies tested.  Pt with likely aspiration of POs - Recommend proceeding with MBS to determine potential to initiate POs.  Continue NPO except meds     Diet Recommendation  Continue with Current Diet: NPO    SLP Plan MBS   Pertinent Vitals/Pain No pain   Swallowing Goals  SLP Swallowing Goals Goal #3: Patient will consume diagnostic PO trials of puree consistency administered by SLP only with no outward s/s of aspiration 5/5 trials. Swallow Study Goal #3 - Progress: Progressing toward goal  General Temperature Spikes Noted: No Respiratory Status: Supplemental O2 delivered via (comment) Behavior/Cognition: Alert;Cooperative;Confused Oral Cavity - Dentition: Missing dentition Patient Positioning: Upright in bed  Oral Cavity - Oral Hygiene Does patient have any of the following "at risk" factors?: Oxygen therapy - cannula, mask, simple oxygen devices Patient is AT RISK - Oral Care Protocol followed (see row info): Yes   Dysphagia Treatment Treatment focused on: Skilled observation of diet tolerance;Upgraded PO texture trials;Patient/family/caregiver education Family/Caregiver Educated: wife and children Treatment Methods/Modalities: Skilled observation;Differential diagnosis;Effortful swallow Patient observed directly with PO's: Yes Type of PO's observed: Dysphagia 1 (puree);Thin liquids Feeding: Needs assist Liquids provided via: Teaspoon Oral Phase Signs & Symptoms:  Prolonged bolus formation;Prolonged oral phase Pharyngeal Phase Signs & Symptoms: Suspected delayed swallow initiation;Multiple swallows;Wet vocal quality;Immediate throat clear;Delayed cough Type of cueing: Verbal;Tactile Amount of cueing: Moderate   Raymond Rangel    Raymond Rangel Raymond Rangel, Raymond Rangel  Raymond Rangel Raymond Rangel 05/28/2012, 1:13 PM

## 2012-05-28 NOTE — Progress Notes (Signed)
Rehab Admissions Coordinator Note:  Patient was screened by Trish Mage for appropriateness for an Inpatient Acute Rehab Consult.  At this time, we are recommending Inpatient Rehab consult.  Trish Mage 05/28/2012, 9:13 AM  I can be reached at [phone].

## 2012-05-28 NOTE — Progress Notes (Signed)
Hypoglycemic Event CBG: 69  Treatment: D50 IV 25 mL     Symptoms: None  Follow-up CBG: Time:2320 CBG Result:112  Possible Reasons for Event: Other: NPO  Comments/MD notified:per standing order    Raymond Rangel  Remember to initiate Hypoglycemia Order Set & complete

## 2012-05-28 NOTE — Progress Notes (Signed)
Dr. Eben Burow advised to hold po meds tonight since pt is scheduled for MBS in AM, and ordered ativan IV as needed for anxiety.

## 2012-05-28 NOTE — Evaluation (Signed)
Speech Language Pathology Evaluation Patient Details Name: Herley Bernardini MRN: [id number] DOB: [birth year] Today's Date: 05/28/2012 Time: 7829-5621 SLP Time Calculation (min): 25 min  Problem List:  Patient Active Problem List  Diagnosis  . Right shoulder pain  . DVT (deep venous thrombosis)  . CAD (coronary artery disease)  . Hyperlipidemia  . Dementia  . Diabetes mellitus, type 2  . CVA (cerebral infarction)   Past Medical History:  Past Medical History  Diagnosis Date  . Mini stroke   . Irregular heart beat   . Blood clot in vein     left leg dvt  . Dementia   . DVT (deep venous thrombosis)   . Hypercholesteremia   . Arthritis   . Diabetes mellitus    Past Surgical History:  Past Surgical History  Procedure Laterality Date  . Left arm    . Rotator cuff repair  2000(L) 1999 (R)   HPI:  79 yo male h/o tia, dementia comes in with rt sided weakness for less than 24 hours.  dtr is present.  He lives with his wife.  He was normal yesterday (his baseline is walking with cane, able to feed himself, normally can speak clearly ) but today he is more confused than normal, cannot answer questions appropriately and rt side of body weak.  No sz activity.  No fevers/n/v/d.  No rashes.  He is confused but denies any pain.  History obtained from dtr. MRI reveals Acute left PCA territory infarct affects the thalamus, medial temporal lobe, occipital lobe, potentially affecting the left internal capsule.      Assessment / Plan / Recommendation Clinical Impression  Pt presents with a moderate cortical/subcortical aphasia marked by diffuse deficits in auditory comprehension as well as expression.  Pt able to follow basic one-step commands with 75 % accuracy; deficits noted in repetition and naming with presence of verbal paraphasias; mild dysarthria, all superimposed on a baseline dementia.   Recommend SLP f/u for aphasia and dysphagia - agree with recs for CIR.      SLP Assessment  Patient  needs continued Speech Lanaguage Pathology Services    Follow Up Recommendations  Inpatient Rehab    Frequency and Duration min 3x week  2 weeks   Pertinent Vitals/Pain No pain   SLP Goals  SLP Goals Potential to Achieve Goals: Good Potential Considerations: Ability to learn/carryover information Progress/Goals/Alternative treatment plan discussed with pt/caregiver and they: Agree SLP Goal #1: Pt will discriminate among functional items in room with 80% accuracy and mod assist SLP Goal #2: Pt will name functional items in room with 70 % accuracy and sem+phonemic cues.  SLP Evaluation Prior Functioning  Cognitive/Linguistic Baseline: Baseline deficits Baseline deficit details: short-term memory deficits due to dementia Type of Home: House Lives With: Spouse Available Help at Discharge: Family;Available 24 hours/day   Cognition  Overall Cognitive Status: Impaired at baseline Arousal/Alertness: Awake/alert    Comprehension  Auditory Comprehension Overall Auditory Comprehension: Impaired Yes/No Questions: Impaired Basic Biographical Questions: 26-50% accurate Commands: Impaired One Step Basic Commands: 75-100% accurate Conversation: Simple Interfering Components: Attention;Processing speed EffectiveTechniques: Extra processing time;Increased volume;Slowed speech Visual Recognition/Discrimination Discrimination: Exceptions to [facility] Common Objects: Able in field of 3 Reading Comprehension Reading Status: Not tested    Expression Expression Primary Mode of Expression: Verbal Verbal Expression Overall Verbal Expression: Impaired Initiation: No impairment Automatic Speech: Social Response;Name Level of Generative/Spontaneous Verbalization: Sentence Repetition: Impaired Level of Impairment: Phrase level Naming: Impairment Responsive: 26-50% accurate Confrontation: Impaired Common Objects: Able in field  of 3 Convergent: 25-49% accurate Divergent: 0-24% accurate Verbal  Errors: Phonemic paraphasias Effective Techniques: Semantic cues;Phonemic cues Written Expression Dominant Hand: Right Written Expression: Not tested   Oral / Motor Oral Motor/Sensory Function Overall Oral Motor/Sensory Function: Impaired Motor Speech Overall Motor Speech: Impaired Phonation: Normal Resonance: Within functional limits Intelligibility: Intelligibility reduced Word: 75-100% accurate Sentence: 75-100% accurate Conversation: 75-100% accurate   GO    Shaine Mount [name], [place] CCC/SLP Pager [phone]  [name] 05/28/2012, 1:24 PM

## 2012-05-28 NOTE — Consult Note (Signed)
Physical Medicine and Rehabilitation Consult Reason for Consult: CVA Referring Physician: [facility]   HPI: Raymond Rangel is a 79 y.o. right-handed male history of TIA, dementia. Patient adult [facility] [employer]. lives with his wife and family and attends Admitted 05/26/2012 with right-sided weakness. MRI shows acute left PCA territory infarcts without hemorrhage. MRA of the head with 50% mid basilar stenosis. Proximal occlusion left PCA. Echocardiogram with ejection fraction 60% and grade 1 diastolic dysfunction. Carotid Dopplers with no ICA stenosis. Patient did not receive TPA. Neurology consulted patient maintained on aspirin prior to admission changed to 325 mg daily. Subcutaneous Lovenox for DVT prophylaxis. Currently patient is n.p.o.  follow per speech therapy for swallowing evaluation. Physical therapy evaluation completed 05/27/2012 with recommendations of physical medicine rehabilitation consult to consider inpatient rehabilitation services   Review of Systems  Cardiovascular: Positive for palpitations.  Musculoskeletal: Positive for myalgias.  Neurological: Positive for weakness.  Psychiatric/Behavioral: Positive for memory loss.  All other systems reviewed and are negative.   Past Medical History  Diagnosis Date  . Mini stroke   . Irregular heart beat   . Blood clot in vein     left leg dvt  . Dementia   . DVT (deep venous thrombosis)   . Hypercholesteremia   . Arthritis   . Diabetes mellitus    Past Surgical History  Procedure Laterality Date  . Left arm    . Rotator cuff repair  2000(L) 1999 (R)   Family History  Problem Relation Age of Onset  . Heart disease Mother   . Diabetes Mother   . Colon cancer Neg Hx   . Prostate cancer Neg Hx    Social History:  reports that he quit smoking about 54 years ago. He has never used smokeless tobacco. He reports that he does not drink alcohol or use illicit drugs. Allergies: No Known Allergies Medications Prior to  Admission  Medication Sig Dispense Refill  . aspirin 81 MG tablet Take 160 mg by mouth daily.      . simvastatin (ZOCOR) 40 MG tablet Take 40 mg by mouth at bedtime.       . traMADol (ULTRAM) 50 MG tablet Take 50 mg by mouth every 6 (six) hours as needed. For pain      . enoxaparin (LOVENOX) 150 MG/ML injection Inject 0.6 mLs (90 mg total) into the skin daily.  4.2 mL  0    Home: Home Living Lives With: Spouse (has recently had a hip surgery) Available Help at Discharge: Family;Available 24 hours/day (daughter and nefew) Type of Home: House Home Access: Stairs to enter [employer] of Steps: 3 Entrance Stairs-Rails: Right;Left Home Layout: One level Bathroom Shower/Tub: Walk-in shower;Door Teacher, early years/pre: No (normally leaves walker at door of bathroom and uses cane in) Home Adaptive Equipment: Bedside commode/3-in-1;Walker - rolling;Quad cane (using 3-in-1 in shower, 3 pronged cane) Additional Comments: [facility]  Functional History: Prior Function Able to Take Stairs?: Yes Driving: No Functional Status:  Mobility: Bed Mobility Bed Mobility: Supine to Sit;Sitting - Scoot to Edge of Bed Supine to Sit: 2: Max assist;HOB elevated Sitting - Scoot to [employer] of Bed: 2: Max assist Transfers Transfers: Sit to Stand;Stand to [employer] Transfers Sit to Stand: 2: Max assist;From elevated surface;From bed;From chair/3-in-1 Stand to Sit: 2: Max assist;With armrests;To bed;To chair/3-in-1 Stand Pivot Transfers: 2: Max assist;From elevated surface      ADL:    Cognition: Cognition Arousal/Alertness: Awake/alert Orientation Level:  Oriented to person Cognition Overall Cognitive Status: Impaired Area of Impairment: Attention;Following commands;Safety/judgement;Awareness of errors;Awareness of deficits;Problem solving;Executive functioning Arousal/Alertness: Awake/alert  Blood pressure 122/68, pulse 88, temperature  98 F (36.7 C), temperature source Oral, resp. rate 20, height 5\' 6"  (1.676 m), weight 61.78 kg (136 lb 3.2 oz), SpO2 99.00%. Physical Exam  Vitals reviewed. HENT:  Head: Normocephalic.  Eyes: EOM are normal.  Neck: Neck supple. No thyromegaly present.  Cardiovascular: Normal rate and regular rhythm.   Pulmonary/Chest: Effort normal and breath sounds normal. No respiratory distress.  Abdominal: Soft. Bowel sounds are normal. He exhibits no distension. There is no tenderness.  Neurological:  Patient is lethargic but arousable. Difficult to maintain attention. RN had noted patient to be very poor historian. He did state his name. He could state his wife's name. He followed simple commands  Skin: Skin is warm and dry.    Results for orders placed during the hospital encounter of 05/26/12 (from the past 24 hour(s))  URINALYSIS, ROUTINE W REFLEX MICROSCOPIC     Status: Abnormal   Collection Time    05/27/12 10:48 AM      Result Value Range   Color, Urine YELLOW  YELLOW   APPearance CLEAR  CLEAR   Specific Gravity, Urine 1.024  1.005 - 1.030   pH 5.0  5.0 - 8.0   Glucose, UA NEGATIVE  NEGATIVE mg/dL   Hgb urine dipstick NEGATIVE  NEGATIVE   Bilirubin Urine NEGATIVE  NEGATIVE   Ketones, ur NEGATIVE  NEGATIVE mg/dL   Protein, ur 30 (*) NEGATIVE mg/dL   Urobilinogen, UA 0.2  0.0 - 1.0 mg/dL   Nitrite NEGATIVE  NEGATIVE   Leukocytes, UA NEGATIVE  NEGATIVE  URINE MICROSCOPIC-ADD ON     Status: Abnormal   Collection Time    05/27/12 10:48 AM      Result Value Range   Squamous Epithelial / LPF RARE  RARE   Crystals CA OXALATE CRYSTALS (*) NEGATIVE  GLUCOSE, CAPILLARY     Status: None   Collection Time    05/27/12 12:27 PM      Result Value Range   Glucose-Capillary 93  70 - 99 mg/dL   Comment 1 Notify RN     Comment 2 Documented in Chart    GLUCOSE, CAPILLARY     Status: None   Collection Time    05/27/12  5:58 PM      Result Value Range   Glucose-Capillary 83  70 - 99 mg/dL   GLUCOSE, CAPILLARY     Status: None   Collection Time    05/27/12  9:15 PM      Result Value Range   Glucose-Capillary 81  70 - 99 mg/dL   Comment 1 Notify RN     Comment 2 Documented in Chart    CBC     Status: None   Collection Time    05/28/12  4:25 AM      Result Value Range   WBC 9.2  4.0 - 10.5 K/uL   RBC 4.57  4.22 - 5.81 MIL/uL   Hemoglobin 13.4  13.0 - 17.0 g/dL   HCT 16.1  09.6 - 04.5 %   MCV 86.2  78.0 - 100.0 fL   MCH 29.3  26.0 - 34.0 pg   MCHC 34.0  30.0 - 36.0 g/dL   RDW 40.9  81.1 - 91.4 %   Platelets 178  150 - 400 K/uL  BASIC METABOLIC PANEL     Status: Abnormal  Collection Time    05/28/12  4:25 AM      Result Value Range   Sodium 143  135 - 145 mEq/L   Potassium 3.4 (*) 3.5 - 5.1 mEq/L   Chloride 109  96 - 112 mEq/L   CO2 21  19 - 32 mEq/L   Glucose, Bld 93  70 - 99 mg/dL   BUN 8  6 - 23 mg/dL   Creatinine, Ser 6.57  0.50 - 1.35 mg/dL   Calcium 8.9  8.4 - 84.6 mg/dL   GFR calc non Af Amer 82 (*) >90 mL/min   GFR calc Af Amer >90  >90 mL/min  GLUCOSE, CAPILLARY     Status: None   Collection Time    05/28/12  8:33 AM      Result Value Range   Glucose-Capillary 97  70 - 99 mg/dL   Ct Head Wo Contrast  05/26/2012  *RADIOLOGY REPORT*  Clinical Data: Weakness  CT HEAD WITHOUT CONTRAST  Technique:  Contiguous axial images were obtained from the base of the skull through the vertex without contrast.  Comparison: 05/03/2011  Findings: There is prominence of the sulci and ventricles consistent with brain atrophy.  There is mild low attenuation within the subcortical and periventricular white matter suggestive of chronic microvascular disease.  There is no evidence for acute brain infarct, hemorrhage or mass.  The mastoid air cells appear clear.  There is chronic mucoperiosteal thickening involving the left maxillary sinus.  IMPRESSION:  1.  No acute intracranial abnormalities. 2.  Brain atrophy. 3.  Chronic left maxillary sinus disease   Original Report  Authenticated By: Signa Kell, M.D.    Mr [facility] Wo Contrast  05/27/2012  *RADIOLOGY REPORT*  Clinical Data: Right sided weakness.  MRA HEAD WITHOUT CONTRAST  Technique: Angiographic images of the Circle of Willis were obtained using MRA technique without intravenous contrast.  Comparison: No prior MRA intracranial examinations.  Findings: The images suffer from marked motion degradation.  The internal carotid arteries show gross patency as do the proximal anterior and middle cerebral arteries.  There is absent flow related enhancement of the vertebral arteries despite evidence for  their patency on MRI Brain exam.  This is artifactual and related to patient motion.  The basilar artery displays a 50% stenosis in its midportion.  The left posterior cerebral artery appears  occluded consistent with the observed pattern of infarction. There is poor flow related enhancement in the right PCA.  IMPRESSION: Severely motion degraded exam with artifactual signal loss in both vertebrals. Suspected 50% mid basilar stenosis.  Proximal occlusion left PCA.   Original Report Authenticated By: Davonna Belling, M.D.    Mr Angiogram Neck W Wo Contrast  05/27/2012  *RADIOLOGY REPORT*  Clinical Data:  Left brain stroke.  MRA NECK WITHOUT AND WITH CONTRAST  Technique:  Angiographic images of the neck were obtained using MRA technique without and with intravenous contrast.  Carotid stenosis measurements (when applicable) are obtained utilizing NASCET criteria, using the distal internal carotid diameter as the denominator.  Contrast: 13mL MULTIHANCE GADOBENATE DIMEGLUMINE 529 MG/ML IV SOLN  Comparison:   None.  Findings:  Moderate motion degradation.  Overall the study is diagnostic.  Conventional branching of the great vessels from the arch.  No proximal stenosis of the innominate, left common carotid, right common carotid, or either subclavian.  Vertebrals codominant with suspected bilateral 50% ostial stenoses.  Nonstenotic  atheromatous change left carotid bifurcation.  Cervical ICA free of disease.  Slight  posterior wall plaque right carotid bifurcation, nonstenotic.  Tortuous cervical ICA on the right. Mild nonstenotic atheromatous change distal left cavernous carotid.  There is a fairly severe corkscrew like deformity of the left vertebral opposite C2. It is unclear if this is flow reducing in this patient. The appearance could represent a manifestation of fibromuscular dysplasia.  Similar less severe changes may be present in the mid to upper vertebral on the right.  Both vertebrals contribute roughly equally to the basilar with mild nonstenotic irregularity of the distal left vertebral.  50% mid basilar stenosis is redemonstrated as is left PCA occlusion.  IMPRESSION: Fairly severe corkscrew like deformity of the left vertebral opposite C2; it is unclear if this is flow reducing, but could represent a manifestation of fibromuscular dysplasia.  Similar less impressive changes may be present in the right vertebral.  No extracranial flow reducing carotid stenosis or carotid fibromuscular change is definitely identified.   Original Report Authenticated By: Davonna Belling, M.D.    Mr [facility] Wo Contrast  05/27/2012  *RADIOLOGY REPORT*  Clinical Data: New onset right-sided weakness and slurred speech. Stroke risk factors include hypercholesterolemia, diabetes, and hypertension.  MRI HEAD WITHOUT AND WITH CONTRAST  Technique:  Multiplanar, multiecho pulse sequences of the brain and surrounding structures were obtained according to standard protocol without and with intravenous contrast  Contrast:  MultiHance 13 ml.  Comparison: CT head 05/26/2012  Findings: The patient had difficulty remaining motionless for the study.  Images are suboptimal.  Small or subtle lesions could be overlooked.  There is a moderately large acute infarct affecting the left medial temporal lobe, left medial occipital lobe, and thalamus, and may involve the left  posterior limb internal capsule.  There is no associated hemorrhage. No abnormal post contrast enhancement.  There is advanced atrophy and chronic microvascular ischemic change.  Remote infarcts left cerebellum, left thalamus and left basal ganglia.  Chronic microvascular ischemic change.  Grossly intact calvarium.  Left maxillary sinus disease.  IMPRESSION: Markedly degraded exam due to patient motion.  Acute left PCA territory infarct affects the thalamus, medial temporal lobe, occipital lobe, potentially affecting the left internal capsule. No visible hemorrhage.   Original Report Authenticated By: Davonna Belling, M.D.    Dg Chest Port 1 View  05/26/2012  *RADIOLOGY REPORT*  Clinical Data: Chest pain  PORTABLE CHEST - 1 VIEW  Comparison: 05/02/2011  Findings: Normal heart size.  No pleural effusion or edema.  There is no airspace consolidation identified.  Review of the visualized osseous structures is significant for osteoarthritis involving both glenohumeral joints.  IMPRESSION:  1.  No acute cardiopulmonary abnormalities.   Original Report Authenticated By: Signa Kell, M.D.     Assessment/Plan: Diagnosis: left PCA infarct 1. Does the need for close, 24 hr/day medical supervision in concert with the patient's rehab needs make it unreasonable for this patient to be served in a less intensive setting? Potentially 2. Co-Morbidities requiring supervision/potential complications: cad, dm 3. Due to bladder management, bowel management, safety, skin/wound care, disease management, medication administration, pain management and patient education, does the patient require 24 hr/day rehab nursing? Yes 4. Does the patient require coordinated care of a physician, rehab nurse, PT (1-2 hrs/day, 5 days/week), OT (1-2 hrs/day, 5 days/week) and SLP (1-2 hrs/day, 5 days/week) to address physical and functional deficits in the context of the above medical diagnosis(es)? Yes Addressing deficits in the following areas:  balance, endurance, locomotion, strength, transferring, bowel/bladder control, bathing, dressing, feeding, grooming, toileting, cognition, speech, swallowing  and psychosocial support 5. Can the patient actively participate in an intensive therapy program of at least 3 hrs of therapy per day at least 5 days per week? Yes and Potentially 6. The potential for patient to make measurable gains while on inpatient rehab is good 7. Anticipated functional outcomes upon discharge from inpatient rehab are superviison to min assist with PT, supervision to minimal assist with OT, supervision to min assist with SLP. 8. Estimated rehab length of stay to reach the above functional goals is: 2 to 3 weeks 9. Does the patient have adequate social supports to accommodate these discharge functional goals? Yes 10. Anticipated D/C setting: Home 11. Anticipated post D/C treatments: Outpt therapy 12. Overall Rehab/Functional Prognosis: good  RECOMMENDATIONS: This patient's condition is appropriate for continued rehabilitative care in the following setting: CIR Patient has agreed to participate in recommended program. Potentially Note that insurance prior authorization may be required for reimbursement for recommended care.  Comment: As long as the daughter is aware of his potential dc functional goals and is willing to care for him at this level, then we can pursue CIR.  Ranelle Oyster, MD, [place] [facility]     05/28/2012

## 2012-05-28 NOTE — Progress Notes (Signed)
Stroke Team Progress Note  HISTORY Raymond Rangel is an 79 y.o. male, right handed, with a past medical history significant for hypercholesterolemia, diabetes, TIA's, left leg DVT, mild dementia, brought to the hospital due to new onset right sided weakness, and slurred speech.  Patient's daughter stated that " something happened within the last 24 hours that shut him down to the point that he was not even able to use his right arm to eat and could no recognized family members".  According to family, he has mild memory changes but is able to have a normal conversations and perform his activities of daily living without assistance.  On 05/26/2012 he became more confused and developed a great deal of difficulty understanding people taking to him and expressing himself.  He takes aspirin 81 mg daily and simvastatin.  A CT brain showed no acute intracranial abnormality.   LSN: probable 4 pm 05/26/2012, but no quite sure.  tPA Given: no, due to late presentation.  SUBJECTIVE Son and daughter at bedside. Family helps provide care at home. They are interested in IP rehab.   OBJECTIVE Most recent Vital Signs: Filed Vitals:   05/28/12 0200 05/28/12 0600 05/28/12 1000 05/28/12 1405  BP: 114/75 122/68 117/68 116/63  Pulse: 79 88 60 65  Temp: 97.9 F (36.6 C) 98 F (36.7 C) 97.3 F (36.3 C) 97.7 F (36.5 C)  TempSrc: Oral Oral Axillary Oral  Resp: 20 20 20 20   Height:      Weight:      SpO2: 99% 99% 100% 100%   CBG (last 3)   Recent Labs  05/27/12 2115 05/28/12 0833 05/28/12 1203  GLUCAP 81 97 81    IV Fluid Intake:   . 0.9 % NaCl with KCl 40 mEq / L      MEDICATIONS  . antiseptic oral rinse  15 mL Mouth Rinse BID  . aspirin  300 mg Rectal Daily   Or  . aspirin  325 mg Oral Daily  . enoxaparin (LOVENOX) injection  40 mg Subcutaneous Q24H  . insulin aspart  0-9 Units Subcutaneous TID WC  . QUEtiapine  25 mg Oral BID  . simvastatin  40 mg Oral QHS   PRN:    Diet:  NPO   Activity: Up with assistance DVT Prophylaxis:  Lovenox  CLINICALLY SIGNIFICANT STUDIES Basic Metabolic Panel:   Recent Labs Lab 05/26/12 2031 05/26/12 2101 05/28/12 0425  NA 139 145 143  K 3.6 3.5 3.4*  CL 106 111 109  CO2 22  --  21  GLUCOSE 109* 111* 93  BUN 16 16 8   CREATININE 1.04 1.10 0.84  CALCIUM 9.4  --  8.9   Liver Function Tests:   Recent Labs Lab 05/26/12 2031  AST 19  ALT 9  ALKPHOS 98  BILITOT 0.3  PROT 7.9  ALBUMIN 4.1   CBC:   Recent Labs Lab 05/26/12 2031 05/26/12 2101 05/28/12 0425  WBC 13.0*  --  9.2  NEUTROABS 9.9*  --   --   HGB 14.5 15.0 13.4  HCT 42.7 44.0 39.4  MCV 85.4  --  86.2  PLT 181  --  178   Coagulation:   Recent Labs Lab 05/26/12 2031  LABPROT 14.1  INR 1.10   Cardiac Enzymes:   Recent Labs Lab 05/26/12 2031 05/26/12 2032  CKTOTAL 229  --   TROPONINI  --  <0.30   Urinalysis:   Recent Labs Lab 05/27/12 1048  COLORURINE YELLOW  LABSPEC 1.024  PHURINE 5.0  GLUCOSEU NEGATIVE  HGBUR NEGATIVE  BILIRUBINUR NEGATIVE  KETONESUR NEGATIVE  PROTEINUR 30*  UROBILINOGEN 0.2  NITRITE NEGATIVE  LEUKOCYTESUR NEGATIVE   Lipid Panel    Component Value Date/Time   CHOL 204* 05/27/2012 0625   TRIG 82 05/27/2012 0625   HDL 40 05/27/2012 0625   CHOLHDL 5.1 05/27/2012 0625   VLDL 16 05/27/2012 0625   LDLCALC 148* 05/27/2012 0625   HgbA1C  Lab Results  Component Value Date   HGBA1C 6.7* 05/27/2012    Urine Drug Screen:   No results found for this basename: labopia,  cocainscrnur,  labbenz,  amphetmu,  thcu,  labbarb    Alcohol Level: No results found for this basename: ETH,  in the last 168 hours  Ct Head Wo Contrast 05/26/2012 1.  No acute intracranial abnormalities. 2.  Brain atrophy. 3.  Chronic left maxillary sinus disease       Dg Chest [facility] 05/26/2012 No acute cardiopulmonary abnormalities.    MRI of the brain  05/27/2012  Markedly degraded exam due to patient motion.  Acute left PCA territory infarct  affects the thalamus, medial temporal lobe, occipital lobe, potentially affecting the left internal capsule. No visible hemorrhage.   MRA of the brain  05/27/2012  Severely motion degraded exam with artifactual signal loss in both vertebrals. Suspected 50% mid basilar stenosis.  Proximal occlusion left PCA.    MRA of the neck 05/27/2012  Fairly severe corkscrew like deformity of the left vertebral opposite C2; it is unclear if this is flow reducing, but could represent a manifestation of fibromuscular dysplasia.  Similar less impressive changes may be present in the right vertebral.  No extracranial flow reducing carotid stenosis or carotid fibromuscular change is definitely identified.   2D Echocardiogram  EF 55-60% with no source of embolus.   05/27/12 Carotid Doppler  Preliminary report: Bilateral: No evidence of hemodynamically significant internal carotid artery stenosis. Vertebral artery flow is antegrade.   EKG normal sinus rhythm with sinus arrhythmia   Therapy Recommendations CIR  Physical Exam   General - the pleasant 79 year old male in bed, just woke up, slightly lethargic. Heart - Regular rate and rhythm - soft systolic murmur Lungs - Clear to auscultation Abdomen - Soft - non tender Extremities - Distal pulses intact - no edema Skin - Warm and dry  NEUROLOGIC:   MENTAL STATUS:  AWAKE, ALERT. MILD DENIAL OF OWN DEFICITS. LANG FLUENT, COMP INTACT.  CRANIAL NERVES: pupils equal and reactive to light; DECR RIGHT NL FOLD. NORMAL SENSATION. TONGUE MIDLINE. SHOUDLER SYMM.Dense right homonymous hemianopia and decrease blink to threat on right. MOTOR: normal bulk and tone, moves all extremities. RUE 3/5. RLE 4/5. FULL STRENGTH IN LEFT SIDE. SENSORY: NORMAL TO LT; MILD RIGHT NEGLECT. COORDINATION: LIMITED BY WEAKNESS IN RUE. NORMAL IN LEFT SIDE.   ASSESSMENT Raymond Rangel is a 79 y.o. male presenting with right hemiplegia and dysarthria. No TPA secondary to late presentation. MRI  confirms large left PCA infarct. Infarct due to left PCA occlusion. Patient with severe posterior ciruculation atherosclerosis. PCA occlusion felt to be embolic secondary to unknown source. Not a coumadin candidate due to dementia and fall risk, therefore, no further stroke workup indicated. On aspirin 81 mg orally every day prior to admission. Now on aspirin 325 mg orally every day for secondary stroke prevention. Patient with resultant right hemiplegia, dysarthria, and aphasia.    Leukocytosis  Hyperlipidemia - Simvastatin prior to admission, LDL 148, on statin now, Goal <  100  TIA history  Dementia  Diabetes mellitus - hemoglobin A1c 6.5   History of irregular heartbeat - check EKG - no documented history of atrial fibrillation.  Hospital day # 2  TREATMENT/PLAN  Change to aspirin 81 mg and plavix 75 mg a day x 3 months then one alone given severe intracranial disease and PCA occlusion once able to swallow. For now, continue aspirin 300 mg suppository.  MBSS  Agree with plans for CIR. Family plans to take him home and provide care following.   Annie Main, MSN, RN, ANVP-BC, ANP-BC, [facility] Pager: [phone] 05/28/2012 3:02 PM  I have personally obtained a history, examined the patient, evaluated imaging results, and formulated the assessment and plan of care. I agree with the above.  Delia Heady, MD Medical Director [facility] Pager: [phone] 05/28/2012 5:04 PM

## 2012-05-28 NOTE — Evaluation (Signed)
Occupational Therapy Evaluation Patient Details Name: Raymond Rangel MRN: [id number] DOB: 1931/05/02 Today's Date: 05/28/2012 Time: 1022-1100 OT Time Calculation (min): 38 min  OT Assessment / Plan / Recommendation Clinical Impression  Pt admitted with L PCA infarcts.  Presents with R hemiplegia characterized by increased flexor tone in R UE and shoulder girdle, extensor tone in L LE, and decreased trunk control.  Pt requires +2 assist for all mobililty and is dependent in ADL.  He demonstrates deficits in expressive and receptive language.  Pt will benefit from intensive rehab.  Will follow acutely. Will request order for R UE splint.    OT Assessment  Patient needs continued OT Services    Follow Up Recommendations  CIR    Barriers to Discharge      Equipment Recommendations  3 in 1 bedside comode;Wheelchair (measurements OT);Wheelchair cushion (measurements OT);Hospital bed    Recommendations for Other Services Rehab consult  Frequency  Min 3X/week    Precautions / Restrictions Precautions Precautions: Fall Precaution Comments: increased tone right side (extensor in leg, flexor in arm) Restrictions Weight Bearing Restrictions: No   Pertinent Vitals/Pain No pain reported.    ADL  Eating/Feeding: NPO Grooming: Wash/dry face;Maximal assistance Where Assessed - Grooming: Supported sitting Upper Body Bathing: +1 Total assistance Where Assessed - Upper Body Bathing: Supported sitting Lower Body Bathing: +1 Total assistance Where Assessed - Lower Body Bathing: Supported sitting;Supported sit to stand Upper Body Dressing: +1 Total assistance Where Assessed - Upper Body Dressing: Supported sitting Lower Body Dressing: +1 Total assistance Where Assessed - Lower Body Dressing: Supported sitting;Supported sit to stand Equipment Used: Gait belt Transfers/Ambulation Related to ADLs: +2 total pt 40% with use of R LE extensor tone chair to bed, did not ambulate    OT Diagnosis:  Generalized weakness;Cognitive deficits;Hemiplegia dominant side;Disturbance of vision  OT Problem List: Decreased strength;Decreased activity tolerance;Impaired balance (sitting and/or standing);Impaired vision/perception;Decreased coordination;Decreased cognition;Decreased safety awareness;Decreased knowledge of use of DME or AE;Impaired UE functional use;Impaired tone;Impaired sensation OT Treatment Interventions: Self-care/ADL training;Neuromuscular education;Therapeutic activities;Cognitive remediation/compensation;Visual/perceptual remediation/compensation;Patient/family education;Balance training   OT Goals Acute Rehab OT Goals OT Goal Formulation: With patient Time For Goal Achievement: 06/11/12 Potential to Achieve Goals: Good ADL Goals Pt Will Perform Grooming: with min assist;Sitting, chair ADL Goal: Grooming - Progress: Goal set today Pt Will Transfer to Toilet: with mod assist;Stand pivot transfer;3-in-1 ADL Goal: Toilet Transfer - Progress: Goal set today Miscellaneous OT Goals Miscellaneous OT Goal #1: Pt will perform supine to sit with mod assist in prep for ADL. OT Goal: Miscellaneous Goal #1 - Progress: Goal set today Miscellaneous OT Goal #2: Pt will sit EOB with min guard assist while engaged in ADL x 10 minutes. OT Goal: Miscellaneous Goal #2 - Progress: Goal set today Miscellaneous OT Goal #3: Pt's caregivers will be independent in positioning R UE in chair and bed to prevent injury and deformity. OT Goal: Miscellaneous Goal #3 - Progress: Goal set today Miscellaneous OT Goal #4: Pt will follow 1 step commands during ADL and mobility 100% of time. OT Goal: Miscellaneous Goal #4 - Progress: Goal set today Miscellaneous OT Goal #5: Pt will visually locate objects in R hemispace with min verbal and auditory cues. OT Goal: Miscellaneous Goal #5 - Progress: Goal set today  Visit Information  Last OT Received On: 05/28/12 Assistance Needed: +2 PT/OT  Co-Evaluation/Treatment: Yes    Subjective Data  Subjective: "Ok."  Pt with expressive difficulties. Patient Stated Goal: Daughter would like intensive rehab.   Prior Functioning  Home Living Lives With: Spouse Available Help at Discharge: Family;Available 24 hours/day Type of Home: House Home Access: Stairs to enter [employer] of Steps: 3 Entrance Stairs-Rails: Right;Left Home Layout: One level Bathroom Shower/Tub: Walk-in shower;Door [employer] Toilet: Pharmacist, community: No Home Adaptive Equipment: Bedside commode/3-in-1;Walker - rolling;Quad cane Additional Comments: [facility] to senior center [facility] Prior Function Level of Independence: Independent with assistive device(s) Able to Take Stairs?: Yes Driving: No Vocation: Retired Musician: Expressive difficulties;Receptive difficulties Dominant Hand: Right         Vision/Perception Vision - Assessment Vision Assessment: Vision impaired - to be further tested in functional context Additional Comments: maintains head to R or midline, difficult to assess due to language issues neglect vs visual field deficit   Cognition  Cognition Overall Cognitive Status: Impaired Area of Impairment: Awareness of deficits;Awareness of errors;Safety/judgement;Following commands Arousal/Alertness: Awake/alert Orientation Level: Disoriented to;Place Behavior During Session: [facility] for tasks performed Following Commands: Follows one step commands inconsistently (75 % of time) Safety/Judgement: Decreased safety judgement for tasks assessed Awareness of Errors: Assistance required to identify errors made;Assistance required to correct errors made Cognition - Other Comments: Pt following simple commands consistently this session.      Extremity/Trunk Assessment Right Upper Extremity Assessment RUE ROM/Strength/Tone: Deficits RUE ROM/Strength/Tone Deficits: increased flexor tone with shoulder  adduction, elbow flexion, wrist flexion, forearm supination, and flexed fingers.  Worsens with challenge to trunk. RUE Coordination: Deficits RUE Coordination Deficits: no functional use Left Upper Extremity Assessment LUE ROM/Strength/Tone: WFL for tasks assessed LUE Coordination: WFL - gross/fine motor Trunk Assessment Trunk Exceptions: decreased balance and trunk control due to stroke deficits.       Mobility Bed Mobility Bed Mobility: Sit to Supine;Scooting to HOB Supine to Sit: 2: Max assist;HOB elevated Sitting - Scoot to Edge of Bed: 2: Max assist Sit to Supine: 1: +2 Total assist;HOB flat Sit to Supine: Patient Percentage: 0% Scooting to HOB: 1: +2 Total assist Scooting to [facility]: Patient Percentage: 0% Details for Bed Mobility Assistance: (A) for all compenents of transitional movements.   Transfers Transfers: Sit to Stand;Stand to Sit Sit to Stand: 1: +2 Total assist;With armrests;From chair/3-in-1 Sit to Stand: Patient Percentage: 40% Stand to Sit: 1: +2 Total assist;To bed Stand to Sit: Patient Percentage: 40% Details for Transfer Assistance: Pt with RLE extensor tone & Rt UE flexor tone.  Uses extensor tone to (A) with standing.  (A) to achieve standing, anterior translation of trunk over BOS, rotation of body from bed>recliner, (A) to promote Hip/Knee flexion for descent.         Exercise     Balance Balance Balance Assessed: Yes Static Sitting Balance Static Sitting - Balance Support: Right upper extremity supported;Left upper extremity supported;Feet supported Static Sitting - Level of Assistance: 4: Min assist;3: Mod assist Static Sitting - Comment/# of Minutes: 15 Static Standing Balance Static Standing - Balance Support: Left upper extremity supported Static Standing - Level of Assistance: 2: Max assist Static Standing - Comment/# of Minutes: 2   End of Session OT - End of Session Activity Tolerance: Patient tolerated treatment well Patient left: in bed;with  call bell/phone within reach;with bed alarm set;with family/visitor present Nurse Communication: Mobility status (condom cath off)  GO     Evern Bio 05/28/2012, 2:05 PM [phone]

## 2012-05-28 NOTE — Progress Notes (Signed)
Pt slept whole day after getting the Seroquel @ 10am and wake up @ 6pm . He may need to be off from this med tomorrow morning to do the Modified swallow evaluation.  Danne [place] 05/28/12

## 2012-05-29 ENCOUNTER — Inpatient Hospital Stay (HOSPITAL_COMMUNITY): Payer: Medicare Other

## 2012-05-29 DIAGNOSIS — I2510 Atherosclerotic heart disease of native coronary artery without angina pectoris: Secondary | ICD-10-CM

## 2012-05-29 DIAGNOSIS — F0390 Unspecified dementia without behavioral disturbance: Secondary | ICD-10-CM

## 2012-05-29 DIAGNOSIS — I6350 Cerebral infarction due to unspecified occlusion or stenosis of unspecified cerebral artery: Secondary | ICD-10-CM

## 2012-05-29 LAB — BASIC METABOLIC PANEL
GFR calc Af Amer: >90 mL/min (ref >90)
GFR calc non Af Amer: 82 mL/min — ABNORMAL LOW (ref >90)
Glucose, Bld: 82 mg/dL (ref 70–99)
Potassium: 3.6 mEq/L (ref 3.5–5.1)
Sodium: 142 mEq/L (ref 135–145)

## 2012-05-29 LAB — GLUCOSE, CAPILLARY: Glucose-Capillary: 92 mg/dL (ref 70–99)

## 2012-05-29 MED ORDER — LORAZEPAM 2 MG/ML IJ SOLN
0.5000 mg | Freq: Three times a day (TID) | INTRAMUSCULAR | Status: DC | PRN
  Administered 2012-05-29: 0.5 mg via INTRAVENOUS
  Filled 2012-05-29: qty 1

## 2012-05-29 MED ORDER — ACETAMINOPHEN 325 MG PO TABS
650.0000 mg | ORAL_TABLET | Freq: Four times a day (QID) | ORAL | Status: DC | PRN
  Administered 2012-05-29: 650 mg via ORAL

## 2012-05-29 MED ORDER — ASPIRIN EC 81 MG PO TBEC
81.0000 mg | DELAYED_RELEASE_TABLET | Freq: Every day | ORAL | Status: DC
  Filled 2012-05-29: qty 1

## 2012-05-29 MED ORDER — CLOPIDOGREL BISULFATE 75 MG PO TABS
75.0000 mg | ORAL_TABLET | Freq: Every day | ORAL | Status: DC
  Administered 2012-05-30: 75 mg via ORAL
  Filled 2012-05-29 (×2): qty 1

## 2012-05-29 NOTE — Progress Notes (Signed)
Physical Therapy Treatment Patient Details Name: Raymond Rangel MRN: [id number] DOB: [birth year] Today's Date: 05/29/2012 Time: 5784-6962 PT Time Calculation (min): 30 min  PT Assessment / Plan / Recommendation Comments on Treatment Session  Pt making progress with mobility.   Rt side tone appears to be improving at this date.  Pt was able to flex/extend at knee on cue whereas last PT session pt's Rt knee remained in extension unless manually placed in flexed position.      Follow Up Recommendations  CIR     Does the patient have the potential to tolerate intense rehabilitation     Barriers to Discharge        Equipment Recommendations  Wheelchair (measurements PT);Wheelchair cushion (measurements PT)    Recommendations for Other Services    Frequency Min 4X/week   Plan Discharge plan remains appropriate    Precautions / Restrictions Precautions Precautions: Fall Precaution Comments: increased tone right side (extensor in leg, flexor in arm) Restrictions Weight Bearing Restrictions: No       Mobility  Bed Mobility Bed Mobility: Supine to Sit;Sitting - Scoot to Edge of Bed Supine to Sit: 2: Max assist;With rails;HOB elevated Sitting - Scoot to [employer] of Bed: 3: Mod assist Details for Bed Mobility Assistance: Pt with good improvement in ability to use UE's to (A) with transitioning.  (A) to initiate LE's, elevate torso to sitting upright, stabilize trunk, & use of draw pad to pivot hips & scoot hips closer to EOB.   Transfers Transfers: Sit to Stand;Stand to Sit;Stand Pivot Transfers Sit to Stand: With upper extremity assist;From bed;1: +2 Total assist Sit to Stand: Patient Percentage: 60% Stand to Sit: 1: +2 Total assist;With armrests;To bed;To chair/3-in-1 Stand to Sit: Patient Percentage: 60% Stand Pivot Transfers: 1: +2 Total assist Stand Pivot Transfers: Patient Percentage: 50% Details for Transfer Assistance: Pt unable to take pivotal steps from bed>recliner.    Modified  Rankin (Stroke Patients Only) Pre-Morbid Rankin Score: Moderate disability Modified Rankin: Severe disability      PT Goals Acute Rehab PT Goals Time For Goal Achievement: 06/10/12 Potential to Achieve Goals: Good Pt will go Supine/Side to Sit: with supervision PT Goal: Supine/Side to Sit - Progress: Progressing toward goal Pt will Sit at [facility] of Bed: with supervision;6-10 min;with no upper extremity support PT Goal: Sit at Edge Of Bed - Progress: Progressing toward goal Pt will go Sit to Supine/Side: with supervision Pt will go Sit to Stand: with min assist PT Goal: Sit to Stand - Progress: Progressing toward goal Pt will go Stand to Sit: with min assist PT Goal: Stand to Sit - Progress: Progressing toward goal Pt will Transfer Bed to Chair/Chair to Bed: with min assist PT Transfer Goal: Bed to Chair/Chair to Bed - Progress: Progressing toward goal Pt will Ambulate: 16 - 50 feet;with mod assist;with least restrictive assistive device  Visit Information  Last PT Received On: 05/29/12 Assistance Needed: +2    Subjective Data      Cognition  Cognition Overall Cognitive Status: Impaired Area of Impairment: Awareness of deficits;Problem solving;Safety/judgement;Following commands;Awareness of errors Arousal/Alertness: Awake/alert Behavior During Session: [facility] for tasks performed Following Commands: Follows multi-step commands inconsistently Awareness of Errors: Assistance required to identify errors made;Assistance required to correct errors made Awareness of Deficits: Cues for increased Rt side attention with tasks    Balance  Balance Balance Assessed: Yes Static Sitting Balance Static Sitting - Balance Support: Feet supported;No upper extremity supported;Right upper extremity supported;Left upper extremity supported Static Sitting - Level  of Assistance: 4: Min assist;5: Stand by assistance Static Sitting - Comment/# of Minutes: Cues & facilitation for sitting midline &  increased erect trunk posture.  Pt with improved ability to correct without physical (A).   Performed activities such as reaching across midline for trunk rotation, lateral leaning/weight bearing, & scapular retraction.   Static Standing Balance Static Standing - Balance Support: Left upper extremity supported Static Standing - Level of Assistance: 3: Mod assist Static Standing - Comment/# of Minutes: Performed static standing 3x's x 1 minute each trial.      End of Session PT - End of Session Equipment Utilized During Treatment: Gait belt Activity Tolerance: Patient tolerated treatment well Patient left: in chair;with call bell/phone within reach;with family/visitor present Nurse Communication: Mobility status     Verdell Face, [place] [phone] 05/29/2012

## 2012-05-29 NOTE — Progress Notes (Signed)
Stroke Team Progress Note  HISTORY Raymond Rangel is an 79 y.o. male, right handed, with a past medical history significant for hypercholesterolemia, diabetes, TIA's, left leg DVT, mild dementia, brought to the hospital due to new onset right sided weakness, and slurred speech.  Patient's daughter stated that " something happened within the last 24 hours that shut him down to the point that he was not even able to use his right arm to eat and could no recognized family members".  According to family, he has mild memory changes but is able to have a normal conversations and perform his activities of daily living without assistance.  On 05/26/2012 he became more confused and developed a great deal of difficulty understanding people taking to him and expressing himself.  He takes aspirin 81 mg daily and simvastatin.  A CT brain showed no acute intracranial abnormality.   LSN: probable 4 pm 05/26/2012, but no quite sure.  tPA Given: no, due to late presentation.  SUBJECTIVE Daughter and sitter are at bedside.   OBJECTIVE Most recent Vital Signs: Filed Vitals:   05/28/12 2122 05/29/12 0230 05/29/12 0627 05/29/12 0800  BP: 140/85 129/87 142/81 150/83  Pulse: 74 77 84 79  Temp:  99 F (37.2 C) 98 F (36.7 C) 98.6 F (37 C)  TempSrc: Oral Oral Oral Oral  Resp: 20 18 20 18   Height:      Weight:      SpO2: 100%   99%   CBG (last 3)   Recent Labs  05/28/12 2320 05/29/12 0347 05/29/12 1002  GLUCAP 112* 86 80    IV Fluid Intake:      MEDICATIONS  . antiseptic oral rinse  15 mL Mouth Rinse BID  . aspirin  300 mg Rectal Daily   Or  . aspirin  325 mg Oral Daily  . enoxaparin (LOVENOX) injection  40 mg Subcutaneous Q24H  . QUEtiapine  25 mg Oral BID  . simvastatin  40 mg Oral QHS   PRN:    Diet:  Dysphagia 1 thin liquid diet Activity: Up with assistance DVT Prophylaxis:  Lovenox  CLINICALLY SIGNIFICANT STUDIES Basic Metabolic Panel:   Recent Labs Lab 05/28/12 0425  05/29/12 0545  NA 143 142  K 3.4* 3.6  CL 109 110  CO2 21 19  GLUCOSE 93 82  BUN 8 7  CREATININE 0.84 0.83  CALCIUM 8.9 8.7   Liver Function Tests:   Recent Labs Lab 05/26/12 2031  AST 19  ALT 9  ALKPHOS 98  BILITOT 0.3  PROT 7.9  ALBUMIN 4.1   CBC:   Recent Labs Lab 05/26/12 2031 05/26/12 2101 05/28/12 0425  WBC 13.0*  --  9.2  NEUTROABS 9.9*  --   --   HGB 14.5 15.0 13.4  HCT 42.7 44.0 39.4  MCV 85.4  --  86.2  PLT 181  --  178   Coagulation:   Recent Labs Lab 05/26/12 2031  LABPROT 14.1  INR 1.10   Cardiac Enzymes:   Recent Labs Lab 05/26/12 2031 05/26/12 2032  CKTOTAL 229  --   TROPONINI  --  <0.30   Urinalysis:   Recent Labs Lab 05/27/12 1048  COLORURINE YELLOW  LABSPEC 1.024  PHURINE 5.0  GLUCOSEU NEGATIVE  HGBUR NEGATIVE  BILIRUBINUR NEGATIVE  KETONESUR NEGATIVE  PROTEINUR 30*  UROBILINOGEN 0.2  NITRITE NEGATIVE  LEUKOCYTESUR NEGATIVE   Lipid Panel    Component Value Date/Time   CHOL 204* 05/27/2012 0625   TRIG 82 05/27/2012  0625   HDL 40 05/27/2012 0625   CHOLHDL 5.1 05/27/2012 0625   VLDL 16 05/27/2012 0625   LDLCALC 148* 05/27/2012 0625   HgbA1C  Lab Results  Component Value Date   HGBA1C 6.7* 05/27/2012    Urine Drug Screen:   No results found for this basename: labopia,  cocainscrnur,  labbenz,  amphetmu,  thcu,  labbarb    Alcohol Level: No results found for this basename: ETH,  in the last 168 hours  Ct Head Wo Contrast 05/26/2012 1.  No acute intracranial abnormalities. 2.  Brain atrophy. 3.  Chronic left maxillary sinus disease       Dg Chest [facility] 05/26/2012 No acute cardiopulmonary abnormalities.    MRI of the brain  05/27/2012  Markedly degraded exam due to patient motion.  Acute left PCA territory infarct affects the thalamus, medial temporal lobe, occipital lobe, potentially affecting the left internal capsule. No visible hemorrhage.   MRA of the brain  05/27/2012  Severely motion degraded exam with  artifactual signal loss in both vertebrals. Suspected 50% mid basilar stenosis.  Proximal occlusion left PCA.    MRA of the neck 05/27/2012  Fairly severe corkscrew like deformity of the left vertebral opposite C2; it is unclear if this is flow reducing, but could represent a manifestation of fibromuscular dysplasia.  Similar less impressive changes may be present in the right vertebral.  No extracranial flow reducing carotid stenosis or carotid fibromuscular change is definitely identified.   2D Echocardiogram  EF 55-60% with no source of embolus.   05/27/12 Carotid Doppler  Preliminary report: Bilateral: No evidence of hemodynamically significant internal carotid artery stenosis. Vertebral artery flow is antegrade.   EKG normal sinus rhythm with sinus arrhythmia   Therapy Recommendations CIR  Physical Exam   General - the pleasant 79 year old male in bed, just woke up, slightly lethargic. Heart - Regular rate and rhythm - soft systolic murmur Lungs - Clear to auscultation Abdomen - Soft - non tender Extremities - Distal pulses intact - no edema Skin - Warm and dry  NEUROLOGIC:  MENTAL STATUS:  AWAKE, ALERT. MILD DENIAL OF OWN DEFICITS. LANG FLUENT, COMP INTACT.  CRANIAL NERVES: pupils equal and reactive to light; DECR RIGHT NL FOLD. NORMAL SENSATION. TONGUE MIDLINE. SHOUDLER SYMM.Dense right homonymous hemianopia and decrease blink to threat on right. MOTOR: normal bulk and tone, moves all extremities. RUE 3/5. RLE 4/5. FULL STRENGTH IN LEFT SIDE. SENSORY: NORMAL TO LT; MILD RIGHT NEGLECT. COORDINATION: LIMITED BY WEAKNESS IN RUE. NORMAL IN LEFT SIDE.   ASSESSMENT [name] is a 79 y.o. male presenting with right hemiplegia and dysarthria. No TPA secondary to late presentation. MRI confirms large left PCA infarct. Infarct due to left PCA occlusion. Patient with severe posterior ciruculation atherosclerosis. PCA occlusion felt to be embolic secondary to unknown source. Not a  coumadin candidate due to dementia and fall risk, therefore, no further stroke workup indicated. On aspirin 81 mg orally every day prior to admission. Now on aspirin 81 mg orally every day and clopidogrel 75 mg orally every day for secondary stroke prevention. Patient with resultant right hemiplegia, dysarthria, and aphasia.    Leukocytosis  Hyperlipidemia - Simvastatin prior to admission, LDL 148, on statin now, Goal < 100  TIA history  Dementia  Diabetes mellitus - hemoglobin A1c 6.5   History of irregular heartbeat - check EKG - no documented history of atrial fibrillation.  Hospital day # 3  TREATMENT/PLAN  Change to aspirin 81  mg and plavix 75 mg a day x 3 months then one alone given severe intracranial disease and PCA occlusion since now to swallow.   Agree with plans for CIR. Family plans to take him home and provide care following. Stroke Service will sign off. Please call should any needs arise. Follow up with Dr. Pearlean Brownie, Stroke Clinic, in 2 months.    Annie Main, MSN, RN, ANVP-BC, ANP-BC, [facility] Pager: [phone] 05/29/2012 11:17 AM  I have personally obtained a history, examined the patient, evaluated imaging results, and formulated the assessment and plan of care. I agree with the above.  Delia Heady, MD Medical Director [facility] Pager: [phone] 05/29/2012 11:17 AM

## 2012-05-29 NOTE — Progress Notes (Signed)
Rehab admissions - Evaluated for possible admission.  I met with patient and daughter at bedside.  Dtr refusing SNF.  Dtr agreeable to inpatient rehab admission.  Had a brother who was recently on inpatient rehab.  I have called insurance carrier and have faxed information to [facility].  Awaiting response from insurance carrier.  It may be tomorrow before I hear back from insurance case manager.  Call me for questions.  #[phone]

## 2012-05-29 NOTE — Progress Notes (Signed)
[facility]                                                                                Patient Demographics  Raymond Rangel, is a 79 y.o. male, DOB - 11-14-1931, JYN:[id number], QMV:[id number]  Admit date - 05/26/2012  Admitting Physician Raymond Kos, MD  Outpatient Primary MD for the patient is Raymond Ora, MD  LOS - 3   Chief Complaint  Patient presents with  . Stroke Symptoms         Assessment & Plan   1. Right-sided hemiparesis along with expressive aphasia secondary to L.PCA territory CVA,  noted MRI brain, MRA brain and neck along with echogram, lipid panel noted a home dose statin will be increased upon discharge, for dose aspirin to be continued, neurology following along with PT OT and speech, will need placement to rehabilitation. Sinus rhythm on telemetry. Nuero following. Diet advanced to dysphagia 1 as he is past modified barium, speech continue to monitor. Will need placement.   Lab Results  Component Value Date   HGBA1C 6.7* 05/27/2012    Lab Results  Component Value Date   CHOL 204* 05/27/2012   HDL 40 05/27/2012   LDLCALC 148* 05/27/2012   TRIG 82 05/27/2012   CHOLHDL 5.1 05/27/2012     2. Dyslipidemia. Continue statin, increase home dose statin upon discharge.    3. Diabetes mellitus type 2. A1c noted, sugars low as poor Po status, monitor off Rx.  CBG (last 3)   Recent Labs  05/28/12 2320 05/29/12 0347 05/29/12 1002  GLUCAP 112* 86 80     Lab Results  Component Value Date   HGBA1C 6.7* 05/27/2012      4. History of DVT has completed his anticoagulation course.     5. Mild nonspecific leukocytosis. Could be from the stress of CVA, chest x-ray is clear, he is afebrile, check UA, leukocytosis now resolved.     6. Mild hypokalemia - placed and now stable    7. Delirium. Was extremely somnolent this morning do to benzodiazepine use at night, will minimize benzodiazepines, will place on low-dose Seroquel, much calmer  and awake.     Code Status: Full  Family Communication: Patient and family  Disposition Plan: Rehabilitation   Procedures CT scan brain, MRI MRA brain, echo gram, MRA neck   Consults  neurology, PT, OT, speech   DVT Prophylaxis  Lovenox   Lab Results  Component Value Date   PLT 178 05/28/2012    Medications  Scheduled Meds: . antiseptic oral rinse  15 mL Mouth Rinse BID  . aspirin  300 mg Rectal Daily   Or  . aspirin  325 mg Oral Daily  . enoxaparin (LOVENOX) injection  40 mg Subcutaneous Q24H  . QUEtiapine  25 mg Oral BID  . simvastatin  40 mg Oral QHS   Continuous Infusions:   PRN Meds:.  Antibiotics    Anti-infectives   None       Time Spent in minutes  35   Susa Raring K M.D on 05/29/2012 at 11:07 AM  Between 7am to 7pm - Pager - [phone]  After 7pm go to [email or web address] - password [facility]  And look for the night coverage person covering for me after hours  [facility] Office  [phone]    Subjective:   Raymond Rangel today has, No headache, No chest pain, No abdominal pain - No Nausea, No new weakness tingling or numbness, No Cough - SOB. Right-sided weakness and difficulty in speech  Objective:   Filed Vitals:   05/28/12 2122 05/29/12 0230 05/29/12 0627 05/29/12 0800  BP: 140/85 129/87 142/81 150/83  Pulse: 74 77 84 79  Temp:  99 F (37.2 C) 98 F (36.7 C) 98.6 F (37 C)  TempSrc: Oral Oral Oral Oral  Resp: 20 18 20 18   Height:      Weight:      SpO2: 100%   99%    Wt Readings from Last 3 Encounters:  05/27/12 61.78 kg (136 lb 3.2 oz)  07/29/11 61.689 kg (136 lb)  06/29/11 62.143 kg (137 lb)    No intake or output data in the 24 hours ending 05/29/12 1107  Exam Awake Alert, Oriented X 3, No new F.N deficits, Normal affect, right-sided hemiparesis and some expressive aphasia [place].AT,PERRAL Supple Neck,No JVD, No cervical lymphadenopathy appriciated.  Symmetrical Chest wall movement, Good air movement  bilaterally, CTAB RRR,No Gallops,Rubs or new Murmurs, No Parasternal Heave +ve B.Sounds, Abd Soft, Non tender, No organomegaly appriciated, No rebound - guarding or rigidity. No Cyanosis, Clubbing or edema, No new Rash or bruise     Data Review   Micro Results Recent Results (from the past 240 hour(s))  URINE CULTURE     Status: None   Collection Time    05/27/12 10:49 AM      Result Value Range Status   Specimen Description URINE, CATHETERIZED   Final   Special Requests NONE   Final   Culture  Setup Time 05/27/2012 20:14   Final   Colony Count NO GROWTH   Final   Culture NO GROWTH   Final   Report Status 05/28/2012 FINAL   Final    Radiology Reports Ct Head Wo Contrast  05/26/2012  *RADIOLOGY REPORT*  Clinical Data: Weakness  CT HEAD WITHOUT CONTRAST  Technique:  Contiguous axial images were obtained from the base of the skull through the vertex without contrast.  Comparison: 05/03/2011  Findings: There is prominence of the sulci and ventricles consistent with brain atrophy.  There is mild low attenuation within the subcortical and periventricular white matter suggestive of chronic microvascular disease.  There is no evidence for acute brain infarct, hemorrhage or mass.  The mastoid air cells appear clear.  There is chronic mucoperiosteal thickening involving the left maxillary sinus.  IMPRESSION:  1.  No acute intracranial abnormalities. 2.  Brain atrophy. 3.  Chronic left maxillary sinus disease   Original Report Authenticated By: Signa Kell, M.D.    Dg Chest Port 1 View  05/26/2012  *RADIOLOGY REPORT*  Clinical Data: Chest pain  PORTABLE CHEST - 1 VIEW  Comparison: 05/02/2011  Findings: Normal heart size.  No pleural effusion or edema.  There is no airspace consolidation identified.  Review of the visualized osseous structures is significant for osteoarthritis involving both glenohumeral joints.  IMPRESSION:  1.  No acute cardiopulmonary abnormalities.   Original Report  Authenticated By: Signa Kell, M.D.     CBC  Recent Labs Lab 05/26/12 2031 05/26/12 2101 05/28/12 0425  WBC 13.0*  --  9.2  HGB 14.5 15.0 13.4  HCT 42.7 44.0 39.4  PLT 181  --  178  MCV 85.4  --  86.2  MCH 29.0  --  29.3  MCHC 34.0  --  34.0  RDW 15.5  --  15.4  LYMPHSABS 1.9  --   --   MONOABS 1.1*  --   --   EOSABS 0.1  --   --   BASOSABS 0.1  --   --     Chemistries   Recent Labs Lab 05/26/12 2031 05/26/12 2101 05/28/12 0425 05/29/12 0545  NA 139 145 143 142  K 3.6 3.5 3.4* 3.6  CL 106 111 109 110  CO2 22  --  21 19  GLUCOSE 109* 111* 93 82  BUN 16 16 8 7   CREATININE 1.04 1.10 0.84 0.83  CALCIUM 9.4  --  8.9 8.7  AST 19  --   --   --   ALT 9  --   --   --   ALKPHOS 98  --   --   --   BILITOT 0.3  --   --   --    ------------------------------------------------------------------------------------------------------------------ estimated creatinine clearance is 64.1 ml/min (by C-G formula based on Cr of 0.83). ------------------------------------------------------------------------------------------------------------------  Recent Labs  05/27/12 0625  HGBA1C 6.7*   ------------------------------------------------------------------------------------------------------------------  Recent Labs  05/27/12 0625  CHOL 204*  HDL 40  LDLCALC 148*  TRIG 82  CHOLHDL 5.1   ------------------------------------------------------------------------------------------------------------------ No results found for this basename: TSH, T4TOTAL, FREET3, T3FREE, THYROIDAB,  in the last 72 hours ------------------------------------------------------------------------------------------------------------------ No results found for this basename: VITAMINB12, FOLATE, FERRITIN, TIBC, IRON, RETICCTPCT,  in the last 72 hours  Coagulation profile  Recent Labs Lab 05/26/12 2031  INR 1.10    No results found for this basename: DDIMER,  in the last 72 hours  Cardiac  Enzymes  Recent Labs Lab 05/26/12 2032  TROPONINI <0.30   ------------------------------------------------------------------------------------------------------------------ No components found with this basename: POCBNP,

## 2012-05-29 NOTE — PMR Pre-admission (Signed)
PMR Admission Coordinator Pre-Admission Assessment  Patient: Raymond Rangel is an 79 y.o., male MRN: [id number] DOB: 26-Nov-1931 Height: 5\' 6"  (167.6 cm) Weight: 61.78 kg (136 lb 3.2 oz)              Insurance Information HMO:  Yes    PPO:       PCP:       IPA:       80/20:       OTHER:  Open Access PRIMARY:  A[employer] Medicare Complete      Policy#: [id number]      Subscriber: Joeseph Amor CM Name:  Pennelope Bracken      Phone#: [phone]     Fax#:   Pre-Cert#: [id number]      Employer: Retired Benefits:  Phone #: [phone]     Name: Cecil Cranker. Date: 04/12/11     Deduct: $0      Out of Pocket Max: $4900      Life Max: unlimited CIR: $295 days 1-5     SNF: $25 days 1-20, $152 days 21-49, $0 days 50-100 Outpatient: With medical necessity     Co-Pay: $45 (separate PT and OT copays Home Health: 100%      Co-Pay: none DME: 80%     Co-Pay: 20% Providers: in network  Emergency Contact Information Contact Information   Name Relation Home Work Mobile   Vold-Collins,[name] Daughter [phone]     McKoy,[name] Spouse [phone]       Current Medical History  Patient Admitting Diagnosis:  L PICA infarct  History of Present Illness:A 79 y.o. right-handed male history of TIA, dementia. Patient [facility] to [facility].  Lives with his wife, daughter, and grandson.   Admitted 05/26/2012 with right-sided weakness. MRI shows acute left PCA territory infarcts without hemorrhage. MRA of the head with 50% mid basilar stenosis. Proximal occlusion left PCA. Echocardiogram with ejection fraction 60% and grade 1 diastolic dysfunction. Carotid Dopplers with no ICA stenosis. Patient did not receive TPA. Neurology consulted patient maintained on aspirin prior to admission changed to 325 mg daily. Subcutaneous Lovenox for DVT prophylaxis. Currently patient is n.p.o. follow per speech therapy for swallowing evaluation. Physical therapy evaluation completed 05/27/2012 with recommendations of  physical medicine rehabilitation consult to consider inpatient rehabilitation services.    Total: 8=NIH  Past Medical History  Past Medical History  Diagnosis Date  . Mini stroke   . Irregular heart beat   . Blood clot in vein     left leg dvt  . Dementia   . DVT (deep venous thrombosis)   . Hypercholesteremia   . Arthritis   . Diabetes mellitus    Family History  family history includes Diabetes in his mother and Heart disease in his mother.  There is no history of Colon cancer and Prostate cancer.  Prior Rehab/Hospitalizations:  No previous rehab admissions.  Son was on inpatient rehab recently, so family aware of inpatient rehab.   Current Medications  Current facility-administered medications:antiseptic oral rinse (BIOTENE) solution 15 mL, 15 mL, Mouth Rinse, BID, Tarry Kos, MD, 15 mL at 05/29/12 0847;  aspirin suppository 300 mg, 300 mg, Rectal, Daily, Tarry Kos, MD;  aspirin tablet 325 mg, 325 mg, Oral, Daily, Tarry Kos, MD, 325 mg at 05/29/12 1135;  enoxaparin (LOVENOX) injection 40 mg, 40 mg, Subcutaneous, Q24H, Tarry Kos, MD, 40 mg at 05/29/12 1137 LORazepam (ATIVAN) injection 0.5 mg, 0.5 mg, Intravenous, Q8H PRN, Leroy Sea, MD, 0.5 mg at 05/29/12 1140;  QUEtiapine (SEROQUEL) tablet 25 mg, 25 mg, Oral, BID, Leroy Sea, MD, 25 mg at 05/28/12 1149;  simvastatin (ZOCOR) tablet 40 mg, 40 mg, Oral, QHS, Tarry Kos, MD  Patients Current Diet: Dysphagia  Precautions / Restrictions Precautions Precautions: Fall Precaution Comments: increased tone right side (extensor in leg, flexor in arm) Restrictions Weight Bearing Restrictions: No   Prior Activity Level Community (5-7x/wk): Went out 5 days a week.  [facility].  Home Assistive Devices / Equipment Home Assistive Devices/Equipment: Environmental consultant (specify type) Home Adaptive Equipment: Bedside commode/3-in-1;Walker - rolling;Quad cane  Prior Functional Level Prior  Function Level of Independence: Independent with assistive device(s) Able to Take Stairs?: Yes Driving: No Vocation: Retired  Current Functional Level Cognition  Arousal/Alertness: Awake/alert Overall Cognitive Status: Impaired at baseline Overall Cognitive Status: Impaired Orientation Level: Oriented to person Following Commands: Follows one step commands inconsistently (75 % of time) Safety/Judgement: Decreased safety judgement for tasks assessed Awareness of Errors: Assistance required to identify errors made;Assistance required to correct errors made Cognition - Other Comments: Pt following simple commands consistently this session.      Extremity Assessment (includes Sensation/Coordination)  RUE ROM/Strength/Tone: Deficits RUE ROM/Strength/Tone Deficits: increased flexor tone with shoulder adduction, elbow flexion, wrist flexion, forearm supination, and flexed fingers.  Worsens with challenge to trunk. RUE Coordination: Deficits RUE Coordination Deficits: no functional use  RLE ROM/Strength/Tone: Deficits RLE ROM/Strength/Tone Deficits: increased right leg tone in an extensor pattern, unable to flex at the knee in sitting EOB.   RLE Sensation: Deficits RLE Sensation Deficits: pr what I could communicate with pt he reports right leg numbness RLE Coordination: Deficits RLE Coordination Deficits: unable to overcome extensor tone    ADLs  Eating/Feeding: NPO Grooming: Wash/dry face;Maximal assistance Where Assessed - Grooming: Supported sitting Upper Body Bathing: +1 Total assistance Where Assessed - Upper Body Bathing: Supported sitting Lower Body Bathing: +1 Total assistance Where Assessed - Lower Body Bathing: Supported sitting;Supported sit to stand Upper Body Dressing: +1 Total assistance Where Assessed - Upper Body Dressing: Supported sitting Lower Body Dressing: +1 Total assistance Where Assessed - Lower Body Dressing: Supported sitting;Supported sit to  stand Equipment Used: Gait belt Transfers/Ambulation Related to ADLs: +2 total pt 40% with use of R LE extensor tone chair to bed, did not ambulate    Mobility  Bed Mobility: Sit to Supine;Scooting to HOB Supine to Sit: 2: Max assist;HOB elevated Sitting - Scoot to [place] of Bed: 2: Max assist Sit to Supine: 1: +2 Total assist;HOB flat Sit to Supine: Patient Percentage: 0% Scooting to HOB: 1: +2 Total assist Scooting to [facility]: Patient Percentage: 0%    Transfers  Transfers: Sit to Stand;Stand to [employer] Transfers Sit to Stand: 1: +2 Total assist;With armrests;From chair/3-in-1 Sit to Stand: Patient Percentage: 40% Stand to Sit: 1: +2 Total assist;To bed Stand to Sit: Patient Percentage: 40% Stand Pivot Transfers: 1: +2 Total assist Stand Pivot Transfers: Patient Percentage: 40%    Ambulation / Gait / Stairs / Wheelchair Mobility  Ambulation/Gait Ambulation/Gait Assistance: Not tested (comment)    Posture / Balance Static Sitting Balance Static Sitting - Balance Support: Right upper extremity supported;Left upper extremity supported;Feet supported Static Sitting - Level of Assistance: 4: Min assist;3: Mod assist Static Sitting - Comment/# of Minutes: 15 Static Standing Balance Static Standing - Balance Support: Left upper extremity supported Static Standing - Level of Assistance: 2: Max assist Static Standing - Comment/# of Minutes: 2    Special needs/care consideration  BiPAP/CPAP No CPM No Continuous Drip IV Yes, 0.9 NS with 40 meq KCL Dialysis No       Life Vest No Oxygen Yes, 2L [place], but no oxygen at home Special Bed No Trach Size No Wound Vac (area) No      Skin Has eczema                             Bowel mgmt: No documentation of BM since admission. Bladder mgmt: Has a condom catheter. Diabetic mgmt No     Previous Home Environment Living Arrangements: Spouse/significant other;Children Lives With: Spouse Available Help at Discharge: Family;Available 24  hours/day Type of Home: House Home Layout: One level Home Access: Stairs to enter Entrance Stairs-Rails: Right;Left Entrance Stairs-Number of Steps: 3 Bathroom Shower/Tub: Psychologist, counselling;Door [employer] Toilet: Pharmacist, community: No Home Care Services: No Additional Comments: goes to senior center every day  Discharge Living Setting Plans for Discharge Living Setting: Patient's home;House;Lives with (comment) (Lives with wife, Dtr and grandson.) Type of Home at Discharge: House Discharge Home Layout: One level Discharge Home Access: Stairs to enter Entrance Stairs-Number of Steps: 3 Do you have any problems obtaining your medications?: No  Social/Family/Support Systems Patient Roles: Spouse;Parent Contact Information: [name] Witters-Collins - Dtr; [name] McKoy - wife Anticipated Caregiver: Wife and daughters Anticipated Industrial/product designer Information: [name] [phone]; [name] - [phone] Ability/Limitations of Caregiver: Wife still driving.  Dtr can assist. Caregiver Availability: 24/7 Discharge Plan Discussed with Primary Caregiver: Yes Is Caregiver In Agreement with Plan?: Yes Does Caregiver/Family have Issues with Lodging/Transportation while Pt is in Rehab?: No  Goals/Additional Needs Patient/Family Goal for Rehab: PT/OT/ST Supervision to min A goals. Expected length of stay: 2-3 weeks Cultural Considerations: Christian Dietary Needs: Dys 1 with thin liquids Equipment Needs: TBD Pt/Family Agrees to Admission and willing to participate: Yes Program Orientation Provided & Reviewed with Pt/Caregiver Including Roles  & Responsibilities: Yes  Decrease burden of Care through IP rehab admission: Diet advancement, Decrease number of caregivers and Patient/family education   Possible need for SNF placement upon discharge: Yes.  However, family say they do not want SNF and wish to take patient home after rehab.  Patient Condition: This patient's condition  remains as documented in the Consult dated 05/28/12, in which the Rehabilitation Physician determined and documented that the patient's condition is appropriate for intensive rehabilitative care in an inpatient rehabilitation facility.  Preadmission Screen Completed By:  Trish Mage, 05/29/2012 11:50 AM ______________________________________________________________________   Discussed status with Dr. Riley Kill on 05/30/12 at 1124 and received telephone approval for admission today.  Admission Coordinator:  Trish Mage, time1124/Date02/19/14

## 2012-05-29 NOTE — Procedures (Addendum)
Objective Swallowing Evaluation: Modified Barium Swallowing Study  Patient Details  Name: Raymond Rangel MRN: [id number] Date of Birth: [birth year]  Today's Date: 05/29/2012 Time: 1000-1030 SLP Time Calculation (min): 30 min  Past Medical History:  Past Medical History  Diagnosis Date  . Mini stroke   . Irregular heart beat   . Blood clot in vein     left leg dvt  . Dementia   . DVT (deep venous thrombosis)   . Hypercholesteremia   . Arthritis   . Diabetes mellitus    Past Surgical History:  Past Surgical History  Procedure Laterality Date  . Left arm    . Rotator cuff repair  2000(L) 1999 (R)   HPI:  79 yo male h/o tia, dementia comes in with rt sided weakness for less than 24 hours.  dtr is present.  He lives with his wife.  He was Raymond yesterday (his baseline is walking with cane, able to feed himself, normally can speak clearly ) but today he is more confused than Raymond, cannot answer questions appropriately and rt side of body weak.  No sz activity.  No fevers/n/v/d.  No rashes.  He is confused but denies any pain.  History obtained from dtr. MRI reveals Acute left PCA territory infarct affects the thalamus, medial temporal lobe, occipital lobe, potentially affecting the left internal capsule.  MBS  Indicated to assess risk for aspiration and recommend safest, possible PO diet.       Assessment / Plan / Recommendation Clinical Impression  Dysphagia Diagnosis: Moderate oral phase dysphagia;Moderate pharyngeal phase dysphagia Moderate sensory motor oral dysphagia marked by the following: poor oral awareness, ineffective vertical mastication, decreased lingual strength and coordination resulting in poor bolus cohesion and transit.  Majority of mechanical soft bolus wedged on alveolar ridge requiring max verbal and tactile cues to remove bolus with tongue sweep.  Whole barium tablet administered in puree with tablet remaining on anterior portion of tongue.  Patient unable to transit  pill and eventually expectorated pill.  Moderate sensory motor pharyngeal phase dysphagia.  Trace penetration during swallow of nectar by cup and straw consistently mainly due to residuals in pyriforms.  Initiation of swallow with thin by cup and straw at pyriforms with trace silent aspiration x1 out of multiple trials.  Prominent, to what appeared to be, osteophyte at C-4 slowing bolus transit and increasing residuals at level of pyriforms and CP segment. Residuals at pyriforms cleared with cued second swallow.  Brief esophageal screen indicates functional bolus transit.  Recommend to proceed with dysphagia 1 (puree) and thin liquids with full assist with each meals to ensure safety.  Recommend puree mainly due to severity of oral dysphagia.  Please note patient required hand over hand assist for self feeding with majority of PO trials administered by treating SLP.  Only trace silent aspiration occurred with nectar thick liquids and x1 with thin liquid but patient was in optimal upright positioning.  Defer to upgrade solids bedside to treating SLP with clinical improvement.  ST to follow in acute care setting for diet tolerance and possible advancement.  Diagnostic treatment completed after evaluation focusing on providing education to caregivers on recommended swallow strategies specific for patient to decrease risk of aspiration.       Treatment Recommendation       Diet Recommendation Dysphagia 1 (Puree);Thin liquid   Liquid Administration via: Cup;Straw Medication Administration: Crushed with puree Supervision: Full supervision/cueing for compensatory strategies;Staff feed patient Compensations: Slow rate;Small sips/bites;Check for pocketing;Multiple dry swallows  after each bite/sip;Clear throat intermittently Postural Changes and/or Swallow Maneuvers: Out of bed for meals;Seated upright 90 degrees;Upright 30-60 min after meal    Other  Recommendations Oral Care Recommendations: Oral care before  and after PO Other Recommendations: Clarify dietary restrictions   Follow Up Recommendations  Inpatient Rehab    Frequency and Duration min 3x week  2 weeks       SLP Swallow Goals Patient will consume recommended diet without observed clinical signs of aspiration with: Maximum assistance Patient will utilize recommended strategies during swallow to increase swallowing safety with: Maximum assistance Swallow Study Goal #3 - Progress: Discontinued (comment) (Following results of MBS )   General Date of Onset: 05/26/12 HPI: 79 yo male h/o tia, dementia comes in with rt sided weakness for less than 24 hours.  dtr is present.  He lives with his wife.  He was Raymond yesterday (his baseline is walking with cane, able to feed himself, normally can speak clearly ) but today he is more confused than Raymond, cannot answer questions appropriately and rt side of body weak.  No sz activity.  No fevers/n/v/d.  No rashes.  He is confused but denies any pain.  History obtained from dtr. MRI reveals Acute left PCA territory infarct affects the thalamus, medial temporal lobe, occipital lobe, potentially affecting the left internal capsule.  Type of Study: Modified Barium Swallowing Study Reason for Referral: Objectively evaluate swallowing function Previous Swallow Assessment: BSE 05/26/12 Diet Prior to this Study: NPO Temperature Spikes Noted: No Respiratory Status: Supplemental O2 delivered via (comment) (nasal cannula  2Liter ) History of Recent Intubation: No Behavior/Cognition: Alert;Cooperative;Pleasant mood;Requires cueing;Confused Oral Cavity - Dentition: Dentures, top Oral Motor / Sensory Function: Impaired - see Bedside swallow eval Self-Feeding Abilities: Total assist Patient Positioning: Upright in chair Baseline Vocal Quality: Clear Volitional Cough: Strong Volitional Swallow: Unable to elicit Anatomy: Within functional limits Pharyngeal Secretions: Not observed secondary MBS    Reason  for Referral Objectively evaluate swallowing function   Oral Phase Oral Preparation/Oral Phase Oral Phase: Impaired Oral - Nectar Oral - Nectar Teaspoon: Lingual pumping;Incomplete tongue to palate contact;Reduced posterior propulsion;Delayed oral transit;Decreased velopharyngeal closure;Piecemeal swallowing;Lingual/palatal residue;Weak lingual manipulation Oral - Nectar Cup: Lingual pumping;Incomplete tongue to palate contact;Reduced posterior propulsion;Lingual/palatal residue;Piecemeal swallowing;Decreased velopharyngeal closure;Delayed oral transit;Weak lingual manipulation Oral - Nectar Straw: Lingual pumping;Incomplete tongue to palate contact;Reduced posterior propulsion;Lingual/palatal residue;Piecemeal swallowing;Decreased velopharyngeal closure;Holding of bolus;Delayed oral transit;Weak lingual manipulation Oral - Thin Oral - Thin Teaspoon: Weak lingual manipulation;Lingual pumping;Incomplete tongue to palate contact;Reduced posterior propulsion;Holding of bolus;Delayed oral transit;Decreased velopharyngeal closure;Piecemeal swallowing;Lingual/palatal residue Oral - Thin Cup: Lingual pumping;Incomplete tongue to palate contact;Reduced posterior propulsion;Weak lingual manipulation;Holding of bolus;Delayed oral transit;Decreased velopharyngeal closure;Piecemeal swallowing;Lingual/palatal residue Oral - Thin Straw: Lingual pumping;Weak lingual manipulation;Incomplete tongue to palate contact;Reduced posterior propulsion;Holding of bolus;Decreased velopharyngeal closure;Piecemeal swallowing;Lingual/palatal residue;Delayed oral transit Oral - Solids Oral - Puree: Lingual pumping;Incomplete tongue to palate contact;Weak lingual manipulation;Delayed oral transit;Decreased velopharyngeal closure;Piecemeal swallowing;Lingual/palatal residue;Reduced posterior propulsion Oral - Mechanical Soft: Impaired mastication;Weak lingual manipulation;Incomplete tongue to palate contact;Lingual  pumping;Lingual/palatal residue;Piecemeal swallowing;Decreased velopharyngeal closure;Holding of bolus;Reduced posterior propulsion;Delayed oral transit Oral - Pill: Lingual pumping;Weak lingual manipulation;Holding of bolus   Pharyngeal Phase Pharyngeal Phase Pharyngeal Phase: Impaired Pharyngeal - Nectar Pharyngeal - Nectar Teaspoon: Premature spillage to valleculae;Reduced pharyngeal peristalsis;Reduced anterior laryngeal mobility;Reduced tongue base retraction;Reduced laryngeal elevation;Pharyngeal residue - pyriform sinuses;Pharyngeal residue - cp segment Pharyngeal - Nectar Cup: Premature spillage to valleculae;Reduced pharyngeal peristalsis;Reduced laryngeal elevation;Reduced airway/laryngeal closure;Reduced tongue base retraction;Penetration/Aspiration during swallow;Trace aspiration;Pharyngeal residue - pyriform  sinuses Penetration/Aspiration details (nectar cup): Material enters airway, CONTACTS cords and not ejected out Pharyngeal - Nectar Straw: Premature spillage to pyriform sinuses;Reduced laryngeal elevation;Reduced airway/laryngeal closure;Reduced tongue base retraction;Reduced pharyngeal peristalsis;Reduced anterior laryngeal mobility;Penetration/Aspiration during swallow;Trace aspiration;Pharyngeal residue - posterior pharnyx;Pharyngeal residue - valleculae;Pharyngeal residue - pyriform sinuses Penetration/Aspiration details (nectar straw): Material enters airway, CONTACTS cords and not ejected out Pharyngeal - Thin Pharyngeal - Thin Teaspoon: Premature spillage to valleculae;Reduced pharyngeal peristalsis;Reduced epiglottic inversion;Reduced anterior laryngeal mobility;Reduced laryngeal elevation;Reduced airway/laryngeal closure;Reduced tongue base retraction;Pharyngeal residue - pyriform sinuses;Pharyngeal residue - valleculae Pharyngeal - Thin Cup: Reduced laryngeal elevation;Reduced pharyngeal peristalsis;Premature spillage to pyriform sinuses;Reduced anterior laryngeal  mobility;Penetration/Aspiration during swallow;Reduced airway/laryngeal closure;Reduced tongue base retraction;Pharyngeal residue - pyriform sinuses Penetration/Aspiration details (thin cup): Material enters airway, remains ABOVE vocal cords and not ejected out Pharyngeal - Thin Straw: Premature spillage to pyriform sinuses;Reduced pharyngeal peristalsis;Reduced anterior laryngeal mobility;Reduced laryngeal elevation;Reduced tongue base retraction Pharyngeal - Solids Pharyngeal - Puree: Premature spillage to valleculae;Reduced pharyngeal peristalsis;Reduced laryngeal elevation;Reduced airway/laryngeal closure;Reduced tongue base retraction;Pharyngeal residue - valleculae;Pharyngeal residue - pyriform sinuses;Pharyngeal residue - posterior pharnyx Pharyngeal - Mechanical Soft: Premature spillage to valleculae;Reduced pharyngeal peristalsis;Reduced laryngeal elevation;Reduced airway/laryngeal closure;Reduced tongue base retraction;Pharyngeal residue - pyriform sinuses  Cervical Esophageal Phase    GO    Cervical Esophageal Phase Cervical Esophageal Phase: Impaired Cervical Esophageal Phase - Nectar Nectar Cup: Reduced cricopharyngeal relaxation Cervical Esophageal Phase - Solids Puree: Reduced cricopharyngeal relaxation Mechanical Soft: Reduced cricopharyngeal relaxation        Moreen Fowler M.S., CCC-SLP [phone] [facility] 05/29/2012, 11:07 AM

## 2012-05-30 ENCOUNTER — Inpatient Hospital Stay (HOSPITAL_COMMUNITY)
Admission: RE | Admit: 2012-05-30 | Discharge: 2012-06-25 | DRG: 945 | Disposition: A | Discharge status: Home Health | Payer: Medicare Other | Source: Intra-hospital | Attending: Physical Medicine & Rehabilitation | Admitting: Physical Medicine & Rehabilitation

## 2012-05-30 DIAGNOSIS — K5900 Constipation, unspecified: Secondary | ICD-10-CM | POA: Diagnosis present

## 2012-05-30 DIAGNOSIS — I6340 Cerebral infarction due to embolism of unspecified cerebral artery: Secondary | ICD-10-CM | POA: Diagnosis present

## 2012-05-30 DIAGNOSIS — Z5189 Encounter for other specified aftercare: Secondary | ICD-10-CM | POA: Diagnosis present

## 2012-05-30 DIAGNOSIS — R1311 Dysphagia, oral phase: Secondary | ICD-10-CM | POA: Diagnosis present

## 2012-05-30 DIAGNOSIS — I6330 Cerebral infarction due to thrombosis of unspecified cerebral artery: Secondary | ICD-10-CM

## 2012-05-30 DIAGNOSIS — Z7902 Long term (current) use of antithrombotics/antiplatelets: Secondary | ICD-10-CM | POA: Diagnosis not present

## 2012-05-30 DIAGNOSIS — Z7982 Long term (current) use of aspirin: Secondary | ICD-10-CM

## 2012-05-30 DIAGNOSIS — Z8673 Personal history of transient ischemic attack (TIA), and cerebral infarction without residual deficits: Secondary | ICD-10-CM

## 2012-05-30 DIAGNOSIS — I639 Cerebral infarction, unspecified: Secondary | ICD-10-CM

## 2012-05-30 DIAGNOSIS — H53469 Homonymous bilateral field defects, unspecified side: Secondary | ICD-10-CM | POA: Diagnosis present

## 2012-05-30 DIAGNOSIS — T6701XA Heatstroke and sunstroke, initial encounter: Secondary | ICD-10-CM

## 2012-05-30 DIAGNOSIS — F0390 Unspecified dementia without behavioral disturbance: Secondary | ICD-10-CM | POA: Diagnosis present

## 2012-05-30 DIAGNOSIS — I2510 Atherosclerotic heart disease of native coronary artery without angina pectoris: Secondary | ICD-10-CM

## 2012-05-30 DIAGNOSIS — E1149 Type 2 diabetes mellitus with other diabetic neurological complication: Secondary | ICD-10-CM | POA: Diagnosis present

## 2012-05-30 DIAGNOSIS — D72829 Elevated white blood cell count, unspecified: Secondary | ICD-10-CM | POA: Diagnosis not present

## 2012-05-30 DIAGNOSIS — E1142 Type 2 diabetes mellitus with diabetic polyneuropathy: Secondary | ICD-10-CM | POA: Diagnosis present

## 2012-05-30 DIAGNOSIS — A498 Other bacterial infections of unspecified site: Secondary | ICD-10-CM | POA: Diagnosis not present

## 2012-05-30 DIAGNOSIS — E7800 Pure hypercholesterolemia, unspecified: Secondary | ICD-10-CM | POA: Diagnosis present

## 2012-05-30 DIAGNOSIS — E119 Type 2 diabetes mellitus without complications: Secondary | ICD-10-CM

## 2012-05-30 DIAGNOSIS — N390 Urinary tract infection, site not specified: Secondary | ICD-10-CM | POA: Diagnosis not present

## 2012-05-30 LAB — CBC
MCH: 29.8 pg (ref 26.0–34.0)
MCHC: 35 g/dL (ref 30.0–36.0)
Platelets: 152 10*3/uL (ref 150–400)
RDW: 15.1 % (ref 11.5–15.5)

## 2012-05-30 LAB — CREATININE, SERUM: Creatinine, Ser: 0.75 mg/dL (ref 0.50–1.35)

## 2012-05-30 MED ORDER — ENSURE COMPLETE PO LIQD
237.0000 mL | Freq: Two times a day (BID) | ORAL | Status: DC
  Administered 2012-05-30: 237 mL via ORAL

## 2012-05-30 MED ORDER — ENOXAPARIN SODIUM 40 MG/0.4ML ~~LOC~~ SOLN
40.0000 mg | SUBCUTANEOUS | Status: DC
  Filled 2012-05-30: qty 0.4

## 2012-05-30 MED ORDER — ENSURE COMPLETE PO LIQD: 237.0000 mL | Freq: Two times a day (BID) | ORAL | Status: DC

## 2012-05-30 MED ORDER — ASPIRIN 81 MG PO CHEW: 81.0000 mg | CHEWABLE_TABLET | Freq: Every day | ORAL | Status: DC

## 2012-05-30 MED ORDER — ASPIRIN 81 MG PO CHEW
81.0000 mg | CHEWABLE_TABLET | Freq: Every day | ORAL | Status: DC
  Administered 2012-05-30: 81 mg via ORAL
  Filled 2012-05-30: qty 1

## 2012-05-30 MED ORDER — ENOXAPARIN SODIUM 30 MG/0.3ML ~~LOC~~ SOLN: 30.0000 mg | SUBCUTANEOUS | Status: DC

## 2012-05-30 MED ORDER — QUETIAPINE FUMARATE 25 MG PO TABS: 25.0000 mg | ORAL_TABLET | Freq: Two times a day (BID) | ORAL | Status: DC

## 2012-05-30 MED ORDER — ASPIRIN 81 MG PO CHEW
81.0000 mg | CHEWABLE_TABLET | Freq: Every day | ORAL | Status: DC
  Administered 2012-05-31 – 2012-06-25 (×26): 81 mg via ORAL
  Filled 2012-05-30 (×25): qty 1

## 2012-05-30 MED ORDER — ATORVASTATIN CALCIUM 40 MG PO TABS: 40.0000 mg | ORAL_TABLET | Freq: Every day | ORAL | Status: DC

## 2012-05-30 MED ORDER — CLOPIDOGREL BISULFATE 75 MG PO TABS
75.0000 mg | ORAL_TABLET | Freq: Every day | ORAL | Status: DC
  Administered 2012-05-31 – 2012-06-25 (×26): 75 mg via ORAL
  Filled 2012-05-30 (×28): qty 1

## 2012-05-30 MED ORDER — CLOPIDOGREL BISULFATE 75 MG PO TABS: 75.0000 mg | ORAL_TABLET | Freq: Every day | ORAL | Status: DC

## 2012-05-30 MED ORDER — SIMVASTATIN 40 MG PO TABS
40.0000 mg | ORAL_TABLET | Freq: Every day | ORAL | Status: DC
  Administered 2012-05-30 – 2012-06-24 (×26): 40 mg via ORAL
  Filled 2012-05-30 (×29): qty 1

## 2012-05-30 MED ORDER — ONDANSETRON HCL 4 MG PO TABS: 4.0000 mg | ORAL_TABLET | Freq: Four times a day (QID) | ORAL | Status: DC | PRN

## 2012-05-30 MED ORDER — ONDANSETRON HCL 4 MG/2ML IJ SOLN: 4.0000 mg | Freq: Four times a day (QID) | INTRAMUSCULAR | Status: DC | PRN

## 2012-05-30 MED ORDER — LORAZEPAM 2 MG/ML IJ SOLN: 0.5000 mg | Freq: Three times a day (TID) | INTRAMUSCULAR | Status: DC | PRN

## 2012-05-30 MED ORDER — SORBITOL 70 % SOLN
30.0000 mL | Freq: Every day | Status: DC | PRN
  Administered 2012-06-01 – 2012-06-23 (×6): 30 mL via ORAL
  Filled 2012-05-30 (×7): qty 30

## 2012-05-30 MED ORDER — ACETAMINOPHEN 325 MG PO TABS
325.0000 mg | ORAL_TABLET | ORAL | Status: DC | PRN
  Administered 2012-05-31 – 2012-06-20 (×3): 650 mg via ORAL
  Filled 2012-05-30 (×3): qty 2

## 2012-05-30 MED ORDER — QUETIAPINE FUMARATE 25 MG PO TABS
25.0000 mg | ORAL_TABLET | Freq: Two times a day (BID) | ORAL | Status: DC
  Administered 2012-05-30 – 2012-06-25 (×51): 25 mg via ORAL
  Filled 2012-05-30 (×58): qty 1

## 2012-05-30 NOTE — Progress Notes (Signed)
INITIAL NUTRITION ASSESSMENT  Pt meets criteria for NON-SEVERE MALNUTRITION in the context of chronic illness as evidenced by mild-moderate fat and muscle wasting.   DOCUMENTATION CODES Per approved criteria  -Non-severe malnutrition in the context of chronic illness   INTERVENTION: Ensure Complete po BID, each supplement provides 350 kcal and 13 grams of protein.  NUTRITION DIAGNOSIS: Inadequate oral intake related to decreased appetite as evidenced by meal completion 50%.   Goal: Pt to meet >/= 90% of their estimated nutrition needs.   Monitor:  PO intake, supplement acceptance, weight  Reason for Assessment: Low Braden  79 y.o. male  Admitting Dx: CVA (cerebral infarction)  ASSESSMENT: Pt admitted with CVA. Pt plans to transfer to in-patient rehab today. Spoke with pt, sitter, and family. Per pt he feels that he has weight loss but is unable to specify how much or in what time frame. Pt states that he was weighing in the 170's, but did not know what time frame.   Height: Ht Readings from Last 1 Encounters:  05/27/12 5\' 6"  (1.676 m)    Weight: Wt Readings from Last 1 Encounters:  05/27/12 136 lb 3.2 oz (61.78 kg)    Ideal Body Weight: 64.5 kg  % Ideal Body Weight: 96%  Wt Readings from Last 10 Encounters:  05/27/12 136 lb 3.2 oz (61.78 kg)  07/29/11 136 lb (61.689 kg)  06/29/11 137 lb (62.143 kg)  06/21/11 126 lb 9.6 oz (57.425 kg)  06/16/11 127 lb (57.607 kg)  06/13/11 133 lb (60.328 kg)  06/10/11 133 lb (60.328 kg)  06/08/11 137 lb (62.143 kg)  05/03/11 139 lb 1.8 oz (63.1 kg)  04/21/11 135 lb (61.236 kg)    Usual Body Weight: unsure  % Usual Body Weight: -  BMI:  Body mass index is 21.99 kg/(m^2).  Estimated Nutritional Needs: Kcal: 1600-1800 Protein: 75-85 grams Fluid: > 1.6 L/day  Skin: no issues  Nutrition Focused Physical Exam:  Subcutaneous Fat:  Orbital Region: WNL Upper Arm Region: mild wasting Thoracic and Lumbar Region:  WNL  Muscle:  Temple Region: mild wasting Clavicle Bone Region: severe wasting Clavicle and Acromion Bone Region: severe wasting Scapular Bone Region: NA Dorsal Hand: WNL Patellar Region: WNL Anterior Thigh Region: mild wasting Posterior Calf Region: mild wasting  Edema: not present   Diet Order: Dysphagia 1 with Thin Liquids Meal Completion: 50%  EDUCATION NEEDS: -No education needs identified at this time   Intake/Output Summary (Last 24 hours) at 05/30/12 1314 Last data filed at 05/30/12 0803  Gross per 24 hour  Intake    480 ml  Output    450 ml  Net     30 ml    Last BM: 2/15   Labs:   Recent Labs Lab 05/26/12 2031 05/26/12 2101 05/28/12 0425 05/29/12 0545  NA 139 145 143 142  K 3.6 3.5 3.4* 3.6  CL 106 111 109 110  CO2 22  --  21 19  BUN 16 16 8 7   CREATININE 1.04 1.10 0.84 0.83  CALCIUM 9.4  --  8.9 8.7  GLUCOSE 109* 111* 93 82    CBG (last 3)   Recent Labs  05/29/12 2029 05/29/12 2346 05/30/12 0349  GLUCAP 123* 92 105*    Scheduled Meds: . antiseptic oral rinse  15 mL Mouth Rinse BID  . aspirin  81 mg Oral Daily  . clopidogrel  75 mg Oral Q breakfast  . enoxaparin (LOVENOX) injection  40 mg Subcutaneous Q24H  . QUEtiapine  25 mg Oral BID  . simvastatin  40 mg Oral QHS    Continuous Infusions:   Past Medical History  Diagnosis Date  . Mini stroke   . Irregular heart beat   . Blood clot in vein     left leg dvt  . Dementia   . DVT (deep venous thrombosis)   . Hypercholesteremia   . Arthritis   . Diabetes mellitus     Past Surgical History  Procedure Laterality Date  . Left arm    . Rotator cuff repair  2000(L) 1999 (R)    Kendell Bane RD, LDN, CNSC [phone] Pager [phone] After Hours Pager

## 2012-05-30 NOTE — H&P (Signed)
Physical Medicine and Rehabilitation Admission H&P  Chief Complaint   Patient presents with   .  Stroke Symptoms   :  HPI: Raymond Rangel is a 79 y.o. right-handed male history of TIA, diabetes mellitus and dementia. 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 Admitted 05/26/2012 with right-sided weakness and slurred speech. MRI shows acute left PCA territory infarcts without hemorrhage. MRA of the head with 50% mid basilar stenosis. Proximal occlusion left PCA. Echocardiogram with ejection fraction 60% and grade 1 diastolic dysfunction. Carotid Dopplers with no ICA stenosis. Patient did not receive TPA. Neurology consulted patient maintained on aspirin 81 mg daily as prior to admission and Plavix added to regimen. Subcutaneous Lovenox for DVT prophylaxis. Currently patient is maintained on a dysphagia 1 thin liquid diet after modified barium swallow study 05/28/2012.Monitoring of cognition with noted history of dementia placed on Seroquel. Physical and occupational therapy evaluations completed 05/27/2012 with recommendations of physical medicine rehabilitation consult to consider inpatient rehabilitation services. Patient was felt to be a good candidate for inpatient rehabilitation services and was admitted for a comprehensive rehabilitation program today Review of Systems  Cardiovascular: Positive for palpitations.  Musculoskeletal: Positive for myalgias.  Neurological: Positive for weakness.  Psychiatric/Behavioral: Positive for memory loss.  All other systems reviewed and are negative  Past Medical History   Diagnosis  Date   .  Mini stroke    .  Irregular heart beat    .  Blood clot in vein      left leg dvt   .  Dementia    .  DVT (deep venous thrombosis)    .  Hypercholesteremia    .  Arthritis    .  Diabetes mellitus     Past Surgical History   Procedure  Laterality  Date   .  Left arm     .  Rotator cuff repair   2000(L) 1999 (R)    Family History    Problem  Relation  Age of Onset   .  Heart disease  Mother    .  Diabetes  Mother    .  Colon cancer  Neg Hx    .  Prostate cancer  Neg Hx     Social History: reports that he quit smoking about 54 years ago. He has never used smokeless tobacco. He reports that he does not drink alcohol or use illicit drugs.  Allergies: No Known Allergies  Medications Prior to Admission   Medication  Sig  Dispense  Refill   .  aspirin 81 MG tablet  Take 160 mg by mouth daily.     .  simvastatin (ZOCOR) 40 MG tablet  Take 40 mg by mouth at bedtime.     .  traMADol (ULTRAM) 50 MG tablet  Take 50 mg by mouth every 6 (six) hours as needed. For pain     .  enoxaparin (LOVENOX) 150 MG/ML injection  Inject 0.6 mLs (90 mg total) into the skin daily.  4.2 mL  0    Home:  Home Living  Lives With: Spouse  Available Help at Discharge: Family;Available 24 hours/day  Type of Home: House  Home Access: Stairs to enter  [employer] of Steps: 3  Entrance Stairs-Rails: Right;Left  Home Layout: One level  Bathroom Shower/Tub: Walk-in shower;Door  [employer] Toilet: Standard  Bathroom Accessibility: No  Home Adaptive Equipment: Bedside commode/3-in-1;Walker - rolling;Quad cane  Additional Comments: goes to senior center every day  Functional History:  Prior Function  Able to Take Stairs?: Yes  Driving: No  Vocation: Retired  Functional Status:  Mobility:  Bed Mobility  Bed Mobility: Sit to Supine;Scooting to HOB  Supine to Sit: 2: Max assist;HOB elevated  Sitting - Scoot to [employer] of Bed: 2: Max assist  Sit to Supine: 1: +2 Total assist;HOB flat  Sit to Supine: Patient Percentage: 0%  Scooting to HOB: 1: +2 Total assist  Scooting to [facility]: Patient Percentage: 0%  Transfers  Transfers: Sit to Stand;Stand to [employer] Transfers  Sit to Stand: 1: +2 Total assist;With armrests;From chair/3-in-1  Sit to Stand: Patient Percentage: 40%  Stand to Sit: 1: +2 Total assist;To bed  Stand to Sit: Patient  Percentage: 40%  Stand Pivot Transfers: 1: +2 Total assist  Stand Pivot Transfers: Patient Percentage: 40%  Ambulation/Gait  Ambulation/Gait Assistance: Not tested (comment)   ADL:  ADL  Eating/Feeding: NPO  Grooming: Wash/dry face;Maximal assistance  Where Assessed - Grooming: Supported sitting  Upper Body Bathing: +1 Total assistance  Where Assessed - Upper Body Bathing: Supported sitting  Lower Body Bathing: +1 Total assistance  Where Assessed - Lower Body Bathing: Supported sitting;Supported sit to stand  Upper Body Dressing: +1 Total assistance  Where Assessed - Upper Body Dressing: Supported sitting  Lower Body Dressing: +1 Total assistance  Where Assessed - Lower Body Dressing: Supported sitting;Supported sit to stand  Equipment Used: Gait belt  Transfers/Ambulation Related to ADLs: +2 total pt 40% with use of R LE extensor tone chair to bed, did not ambulate  Cognition:  Cognition  Overall Cognitive Status: Impaired at baseline  Arousal/Alertness: Awake/alert  Orientation Level: Oriented to person  Cognition  Overall Cognitive Status: Impaired  Area of Impairment: Awareness of deficits;Awareness of errors;Safety/judgement;Following commands  Arousal/Alertness: Awake/alert  Orientation Level: Disoriented to;Place  Behavior During Session: [facility] for tasks performed  Following Commands: Follows one step commands inconsistently (75 % of time)  Safety/Judgement: Decreased safety judgement for tasks assessed  Awareness of Errors: Assistance required to identify errors made;Assistance required to correct errors made  Cognition - Other Comments: Pt following simple commands consistently this session.    Physical Exam:  Blood pressure 150/83, pulse 79, temperature 98.6 F (37 C), temperature source Oral, resp. rate 18, height 5\' 6"  (1.676 m), weight 61.78 kg (136 lb 3.2 oz), SpO2 99.00%.   Vitals reviewed.  HENT: oral mucosa pink and moist. Dentition fair to poor. Head:  Normocephalic.  Eyes: EOM are normal.  Neck: Neck supple. No thyromegaly present.  Cardiovascular: Normal rate and regular rhythm. No murmurs rubs or gallops Pulmonary/Chest: Effort normal and breath sounds normal. No respiratory distress.  Abdominal: Soft. Bowel sounds are normal. He exhibits no distension. There is no tenderness.  Neurological:  Patient is  arousable. Mild dysarthria but intelligible. Difficult to maintain attention.  He did state his name. He could not state his wife's name. He followed simple commands. Minimal insight and awareness. Delayed processing. Difficulty with tracking to the right. Appears to have right homonymous hemianopsia with inattention to this side as well. Moves all 4's but has difficulty initiating movement on the right more than left. RUE grossly 2-3/5. RLE is 2/5 but inconsistent as well. LUE and LLE grossly 3/5 with more automatic movement. Withdraws to pain on all 4's.  Skin: Skin is warm and dry    Results for orders placed during the hospital encounter of 05/26/12 (from the past 48 hour(s))   GLUCOSE, CAPILLARY Status: None    Collection Time  05/27/12 12:27 PM   Result  Value  Range    Glucose-Capillary  93  70 - 99 mg/dL    Comment 1  Notify RN     Comment 2  Documented in Chart    GLUCOSE, CAPILLARY Status: None    Collection Time    05/27/12 5:58 PM   Result  Value  Range    Glucose-Capillary  83  70 - 99 mg/dL   GLUCOSE, CAPILLARY Status: None    Collection Time    05/27/12 9:15 PM   Result  Value  Range    Glucose-Capillary  81  70 - 99 mg/dL    Comment 1  Notify RN     Comment 2  Documented in Chart    CBC Status: None    Collection Time    05/28/12 4:25 AM   Result  Value  Range    WBC  9.2  4.0 - 10.5 K/uL    RBC  4.57  4.22 - 5.81 MIL/uL    Hemoglobin  13.4  13.0 - 17.0 g/dL    HCT  16.1  09.6 - 04.5 %    MCV  86.2  78.0 - 100.0 fL    MCH  29.3  26.0 - 34.0 pg    MCHC  34.0  30.0 - 36.0 g/dL    RDW  40.9  81.1 - 91.4 %     Platelets  178  150 - 400 K/uL   BASIC METABOLIC PANEL Status: Abnormal    Collection Time    05/28/12 4:25 AM   Result  Value  Range    Sodium  143  135 - 145 mEq/L    Potassium  3.4 (*)  3.5 - 5.1 mEq/L    Chloride  109  96 - 112 mEq/L    CO2  21  19 - 32 mEq/L    Glucose, Bld  93  70 - 99 mg/dL    BUN  8  6 - 23 mg/dL    Creatinine, Ser  7.82  0.50 - 1.35 mg/dL    Calcium  8.9  8.4 - 10.5 mg/dL    GFR calc non Af Amer  82 (*)  >90 mL/min    GFR calc Af Amer  >90  >90 mL/min    Comment:      The eGFR has been calculated     using the CKD EPI equation.     This calculation has not been     validated in all clinical     situations.     eGFR's persistently     <90 mL/min signify     possible Chronic Kidney Disease.   GLUCOSE, CAPILLARY Status: None    Collection Time    05/28/12 8:33 AM   Result  Value  Range    Glucose-Capillary  97  70 - 99 mg/dL   GLUCOSE, CAPILLARY Status: None    Collection Time    05/28/12 12:03 PM   Result  Value  Range    Glucose-Capillary  81  70 - 99 mg/dL   GLUCOSE, CAPILLARY Status: None    Collection Time    05/28/12 4:26 PM   Result  Value  Range    Glucose-Capillary  72  70 - 99 mg/dL   GLUCOSE, CAPILLARY Status: Abnormal    Collection Time    05/28/12 5:39 PM   Result  Value  Range    Glucose-Capillary  114 (*)  70 - 99  mg/dL   GLUCOSE, CAPILLARY Status: None    Collection Time    05/28/12 8:05 PM   Result  Value  Range    Glucose-Capillary  71  70 - 99 mg/dL    Comment 1  Documented in Chart     Comment 2  Notify RN    GLUCOSE, CAPILLARY Status: Abnormal    Collection Time    05/28/12 10:51 PM   Result  Value  Range    Glucose-Capillary  69 (*)  70 - 99 mg/dL   GLUCOSE, CAPILLARY Status: Abnormal    Collection Time    05/28/12 11:20 PM   Result  Value  Range    Glucose-Capillary  112 (*)  70 - 99 mg/dL   GLUCOSE, CAPILLARY Status: None    Collection Time    05/29/12 3:47 AM   Result  Value  Range     Glucose-Capillary  86  70 - 99 mg/dL    Comment 1  Documented in Chart     Comment 2  Notify RN    BASIC METABOLIC PANEL Status: Abnormal    Collection Time    05/29/12 5:45 AM   Result  Value  Range    Sodium  142  135 - 145 mEq/L    Potassium  3.6  3.5 - 5.1 mEq/L    Chloride  110  96 - 112 mEq/L    CO2  19  19 - 32 mEq/L    Glucose, Bld  82  70 - 99 mg/dL    BUN  7  6 - 23 mg/dL    Creatinine, Ser  1.61  0.50 - 1.35 mg/dL    Calcium  8.7  8.4 - 10.5 mg/dL    GFR calc non Af Amer  82 (*)  >90 mL/min    GFR calc Af Amer  >90  >90 mL/min    Comment:      The eGFR has been calculated     using the CKD EPI equation.     This calculation has not been     validated in all clinical     situations.     eGFR's persistently     <90 mL/min signify     possible Chronic Kidney Disease.   GLUCOSE, CAPILLARY Status: None    Collection Time    05/29/12 10:02 AM   Result  Value  Range    Glucose-Capillary  80  70 - 99 mg/dL    Mr [facility] Wo Contrast  05/27/2012 *RADIOLOGY REPORT* Clinical Data: Right sided weakness. MRA HEAD WITHOUT CONTRAST Technique: Angiographic images of the Circle of Willis were obtained using MRA technique without intravenous contrast. Comparison: No prior MRA intracranial examinations. Findings: The images suffer from marked motion degradation. The internal carotid arteries show gross patency as do the proximal anterior and middle cerebral arteries. There is absent flow related enhancement of the vertebral arteries despite evidence for their patency on MRI Brain exam. This is artifactual and related to patient motion. The basilar artery displays a 50% stenosis in its midportion. The left posterior cerebral artery appears occluded consistent with the observed pattern of infarction. There is poor flow related enhancement in the right PCA. IMPRESSION: Severely motion degraded exam with artifactual signal loss in both vertebrals. Suspected 50% mid basilar stenosis. Proximal  occlusion left PCA. Original Report Authenticated By: Davonna Belling, M.D.  Mr Angiogram Neck W Wo Contrast  05/27/2012 *RADIOLOGY REPORT* Clinical Data: Left brain stroke. MRA NECK WITHOUT  AND WITH CONTRAST Technique: Angiographic images of the neck were obtained using MRA technique without and with intravenous contrast. Carotid stenosis measurements (when applicable) are obtained utilizing NASCET criteria, using the distal internal carotid diameter as the denominator. Contrast: 13mL MULTIHANCE GADOBENATE DIMEGLUMINE 529 MG/ML IV SOLN Comparison: None. Findings: Moderate motion degradation. Overall the study is diagnostic. Conventional branching of the great vessels from the arch. No proximal stenosis of the innominate, left common carotid, right common carotid, or either subclavian. Vertebrals codominant with suspected bilateral 50% ostial stenoses. Nonstenotic atheromatous change left carotid bifurcation. Cervical ICA free of disease. Slight posterior wall plaque right carotid bifurcation, nonstenotic. Tortuous cervical ICA on the right. Mild nonstenotic atheromatous change distal left cavernous carotid. There is a fairly severe corkscrew like deformity of the left vertebral opposite C2. It is unclear if this is flow reducing in this patient. The appearance could represent a manifestation of fibromuscular dysplasia. Similar less severe changes may be present in the mid to upper vertebral on the right. Both vertebrals contribute roughly equally to the basilar with mild nonstenotic irregularity of the distal left vertebral. 50% mid basilar stenosis is redemonstrated as is left PCA occlusion. IMPRESSION: Fairly severe corkscrew like deformity of the left vertebral opposite C2; it is unclear if this is flow reducing, but could represent a manifestation of fibromuscular dysplasia. Similar less impressive changes may be present in the right vertebral. No extracranial flow reducing carotid stenosis or carotid  fibromuscular change is definitely identified. Original Report Authenticated By: Davonna Belling, M.D.  Mr Laqueta Jean Wo Contrast  05/27/2012 *RADIOLOGY REPORT* Clinical Data: New onset right-sided weakness and slurred speech. Stroke risk factors include hypercholesterolemia, diabetes, and hypertension. MRI HEAD WITHOUT AND WITH CONTRAST Technique: Multiplanar, multiecho pulse sequences of the brain and surrounding structures were obtained according to standard protocol without and with intravenous contrast Contrast: MultiHance 13 ml. Comparison: CT head 05/26/2012 Findings: The patient had difficulty remaining motionless for the study. Images are suboptimal. Small or subtle lesions could be overlooked. There is a moderately large acute infarct affecting the left medial temporal lobe, left medial occipital lobe, and thalamus, and may involve the left posterior limb internal capsule. There is no associated hemorrhage. No abnormal post contrast enhancement. There is advanced atrophy and chronic microvascular ischemic change. Remote infarcts left cerebellum, left thalamus and left basal ganglia. Chronic microvascular ischemic change. Grossly intact calvarium. Left maxillary sinus disease. IMPRESSION: Markedly degraded exam due to patient motion. Acute left PCA territory infarct affects the thalamus, medial temporal lobe, occipital lobe, potentially affecting the left internal capsule. No visible hemorrhage. Original Report Authenticated By: Davonna Belling, M.D.   Post Admission Physician Evaluation:  1. Functional deficits secondary to embolic left PCA infarct. 2. Patient is admitted to receive collaborative, interdisciplinary care between the physiatrist, rehab nursing staff, and therapy team. 3. Patient's level of medical complexity and substantial therapy needs in context of that medical necessity cannot be provided at a lesser intensity of care such as a SNF. 4. Patient has experienced substantial functional loss from  his/her baseline which was documented above under the "Functional History" and "Functional Status" headings. Judging by the patient's diagnosis, physical exam, and functional history, the patient has potential for functional progress which will result in measurable gains while on inpatient rehab. These gains will be of substantial and practical use upon discharge in facilitating mobility and self-care at the household level. 5. Physiatrist will provide 24 hour management of medical needs as well as oversight of the therapy plan/treatment  and provide guidance as appropriate regarding the interaction of the two. 6. 24 hour rehab nursing will assist with bladder management, bowel management, safety, skin/wound care, disease management, medication administration and patient education and help integrate therapy concepts, techniques,education, etc. 7. PT will assess and treat for/with: Lower extremity strength, range of motion, stamina, balance, functional mobility, safety, adaptive techniques and equipment, NMR, visualspatial awareness, education. Goals are: supervision to minimal assistance. 8. OT will assess and treat for/with: ADL's, functional mobility, safety, upper extremity strength, adaptive techniques and equipment, NMR, visual-spatial awareness, education. Goals are: min assist. 9. SLP will assess and treat for/with: speech, communication, cognition. Goals are: supervision to minimal assistance. 10. Case Management and Social Worker will assess and treat for psychological issues and discharge planning. 11. Team conference will be held weekly to assess progress toward goals and to determine barriers to discharge. 12. Patient will receive at least 3 hours of therapy per day at least 5 days per week. 13. ELOS: 3 weeks Prognosis: good   Medical Problem List and Plan:  1. embolic left PCA infarct  2. DVT Prophylaxis/Anticoagulation: Subcutaneous Lovenox. Monitor platelet counts and any signs of  bleeding  3. Mood/dementia. Seroquel 25 mg twice a day, Ativan when necessary. Patient displayed baseline cognitive deficits per family.  4. Neuropsych: This patient is not capable of making decisions on his/her own behalf.  5. Hyperlipidemia. Zocor  6. Diabetes mellitus with peripheral neuropathy. Hemoglobin A1c 6.7. Patient on diet control prior to admission. Check CBGs a.c. and at bedtime  7. Dysphagia. Dysphagia 1 thin liquids. Speech therapy followup. Monitor for any signs of aspiration.   Ranelle Oyster, MD, [place] [facility]  05/29/2012

## 2012-05-30 NOTE — Progress Notes (Addendum)
Pt discharged to inpatient rehabilitation room 4027 in stable condition. Family not present at the time of transfer.  Unable to provide stroke discharge education secondary to Pt cognitive barriers to learn.  Spouse [name] contacted by phone at [phone]; made aware of transfer and provided new room number.

## 2012-05-30 NOTE — Progress Notes (Signed)
Rehab admissions - Awaiting insurance approval for possible inpatient rehab admission.  We do have beds available today.  I should hear back from case manager soon about possible inpatient rehab admission today.  Call me for questions.  #[phone]

## 2012-05-30 NOTE — Progress Notes (Signed)
Rehab admissions - I have approval for inpatient rehab auth # [id number] for today.  Bed available and will plan to admit to inpatient rehab today after acute MD sees patient and determines medical readiness.  Call me for questions.  #[phone]

## 2012-05-30 NOTE — Plan of Care (Signed)
Overall Plan of Care [facility]) Patient Details Name: Agostino Gorin MRN: [id number] DOB: [birth year]  Diagnosis:  Rehabilitation for Stroke  Co-morbidities: Date  .  Mini stroke  .  Irregular heart beat  .  Blood clot in vein  left leg dvt  .  Dementia  .  DVT (deep venous thrombosis)  .  Hypercholesteremia  .  Arthritis  .  Diabetes mellitus    Functional Problem List  Patient demonstrates impairments in the following areas: Balance, Bladder, Bowel, Cognition, Endurance, Medication Management, Motor, Nutrition, Pain, Perception, Safety, Sensory  and Skin Integrity  Basic ADL's: eating, grooming, bathing, dressing and toileting Advanced ADL's: not applicable   Transfers:  bed mobility, bed to chair and toilet Locomotion:  ambulation, stairs   Additional Impairments:  Swallowing, Communication  comprehension and expression and Social Cognition   problem solving, memory, attention and awareness  Anticipated Outcomes Item Anticipated Outcome  Eating/Swallowing  Min assist  Basic self-care  Mod assist bathing, UB dressing; Max assist LB dressing  Tolieting  Max assist  Bowel/Bladder  Continent bowel/bladder with timed toileting  Transfers  Mod assist to toilet, bed, furniture  Locomotion  Mod assist gait x 50' and up/down 5 steps  Communication  Min-mod assist  Cognition  Min-mod assist basic  Pain  3 or less 1-10 scale managed with prn medications  Safety/Judgment    Other  Skin: no new breakdown while on rehab, min assist with turning, repositioning, skincare   Therapy Plan: PT Intensity: Minimum of 1-2 x/day ,45 to 90 minutes PT Frequency: 5 out of 7 days PT Duration Estimated Length of Stay: 2 weeks OT Intensity: Minimum of 1-2 x/day, 45 to 90 minutes OT Frequency: 5 out of 7 days OT Duration/Estimated Length of Stay: 2 weeks SLP Intensity: Minumum of 1-2 x/day, 30 to 90 minutes SLP Frequency: 5 out of 7 days SLP Duration/Estimated Length of Stay: 2-3  weeks    Team Interventions: Item RN PT OT SLP SW TR Other  Self Care/Advanced ADL Retraining  x x      Neuromuscular Re-Education  x x      Therapeutic Activities  x x x     UE/LE Strength Training/ROM  x x      UE/LE Coordination Activities  x x      Visual/Perceptual Remediation/Compensation  x x      DME/Adaptive Equipment Instruction  x x      Therapeutic Exercise  x x      Balance/Vestibular Training  x x      Patient/Family Education x x x      Cognitive Remediation/Compensation  x x x     Functional Mobility Training  x x      Ambulation/Gait Training  x       Stair Training  x       Wheelchair Propulsion/Positioning  x       Librarian, academic    x     Speech/Language Facilitation    x     Bladder Management x        Bowel Management x        Disease Management/Prevention x        Pain Management x        Medication Management x        Skin Care/Wound Management x  Splinting/Orthotics  x       Discharge Planning  x  x     Psychosocial Support                                Team Discharge Planning: Destination: PT-Home vs SNF ,OT- Skilled Nursing Facility (SNF) , SLP- (TBD) Projected Follow-up: PT-Home health PT vs SNF OT-  Skilled nursing facility, SLP-24 hour supervision/assistance;Home Health SLP;Skilled Nursing facility Projected Equipment Needs: PT-Rolling walker with 5" wheels;Wheelchair (measurements);Wheelchair cushion (measurements), OT-  , SLP-None recommended by SLP Patient/family involved in discharge planning: PT- Patient unable/family or caregiver not available,  OT-Patient unable/family or caregiver not available, SLP-Patient unable/family or caregive not available  MD ELOS: 2 weeks Medical Rehab Prognosis:  Fair Assessment: 79 yo male with hx of dementia but functional admitted to hospital for acute CVA now requiring 24/7 rehab RN/MD, CIR level  PT/OT.  Treatment team will focus on mobility, safety, NM re education, ADLs, cognition.  Goals Sup/min A ADL mobility .   See Team Conference Notes for weekly updates to the plan of care

## 2012-05-30 NOTE — Progress Notes (Signed)
Patient admitted to IP Rehab at 1655. Lethargic but arousable, oriented to self. Daughter at bedside. Rehab stroke education packet given to daughter, went over rehab schedule, call bell system, and safety plan. Daughter verbalized understanding. Raymond Rangel

## 2012-05-30 NOTE — Discharge Summary (Signed)
Physician Discharge Summary  Raymond Rangel ZOX:[id number] DOB: 1931/09/11 DOA: 05/26/2012  PCP: Raymond Ora, MD  Admit date: 05/26/2012 Discharge date: 05/30/2012  Time spent: 35 minutes  Recommendations for Outpatient Follow-up:  1. Patient will need to continue aspirin 81/Plavix 75 mg daily until determined appropriate by neurologist. Patient should have followup with neurology in 4-6 weeks arranged 2. Patient placed on dysphagia 1 diet-this is to continue in the interim patient to get speech therapy at [facility]   Discharge Diagnoses:  Principal Problem:   CVA (cerebral infarction) Active Problems:   CAD (coronary artery disease)   Hyperlipidemia   Dementia   Diabetes mellitus, type 2   Discharge Condition: Good  Diet recommendation: Dysphagia 1, thin liquids.  Filed Weights   05/27/12 0032  Weight: 61.78 kg (136 lb 3.2 oz)    History of present illness:  Raymond Rangel is a 79 y.o. right-handed male history of TIA, diabetes mellitus and dementia. 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 Admitted 05/26/2012 with right-sided weakness and slurred speech. MRI shows acute left PCA territory infarcts without hemorrhage. MRA of the head with 50% mid basilar stenosis. Proximal occlusion left PCA. Echocardiogram with ejection fraction 60% and grade 1 diastolic dysfunction. Carotid Dopplers with no ICA stenosis. Patient did not receive TPA. Neurology consulted patient maintained on aspirin 81 mg daily as prior to admission and Plavix added to regimen. Subcutaneous Lovenox for DVT prophylaxis. Currently patient is maintained on a dysphagia 1 thin liquid diet after modified barium swallow study 05/28/2012.Monitoring of cognition with noted history of dementia placed on Seroquel. Physical and occupational therapy evaluations completed 05/27/2012 with recommendations of physical medicine rehabilitation consult to consider inpatient rehabilitation services. Patient was felt to be a  good candidate for inpatient rehabilitation services and was admitted for a comprehensive rehabilitation program   Hospital Course:  1. Right-sided hemiparesis along with expressive aphasia secondary to L.PCA territory CVA, noted MRI brain, MRA brain and neck along with echogram, lipid panel noted 148 ldl-was changed to high intensity statin therapy with Lipitor 40 mg daily, aspirin 81+ Plavix 75 for 3 months at least as per neurology. Sinus rhythm on telemetry. Neuro  To follow as an outpatient Lab Results   Component  Value  Date    HGBA1C  6.7*  05/27/2012    Lab Results   Component  Value  Date    CHOL  204*  05/27/2012    HDL  40  05/27/2012    LDLCALC  148*  05/27/2012    TRIG  82  05/27/2012    CHOLHDL  5.1  05/27/2012    2. Dyslipidemia. Change to Zocor to Lipitor 40 mg daily 3. Diabetes mellitus type 2. A1c noted, continue sliding scale insulin and monitor.  CBG (last 3)   Recent Labs   05/27/12 1758  05/27/12 2115  05/28/12 0833   GLUCAP  83  81  97    Lab Results   Component  Value  Date    HGBA1C  6.7*  05/27/2012    4. History of DVT has completed his anticoagulation course-no further need for Lovenox 5. Mild nonspecific leukocytosis. Could be from the stress of CVA, chest x-ray is clear, he is afebrile, check UA, leukocytosis now resolved.  6. Mild hypokalemia will be replaced.  7. Delirium. Was extremely somnolent this morning do to benzodiazepine use at night,  Was placed on low-dose Seroquel, much calmer and awake.   Procedures CT scan brain, MRI  MRA brain, echo gram, MRA neck  Consults neurology, PT, OT, speech  DVT Prophylaxis Lovenox   Discharge Exam: Filed Vitals:   05/29/12 2107 05/30/12 0700 05/30/12 0931 05/30/12 1335  BP: 174/87 104/59 113/55 106/62  Pulse: 96 63 96 79  Temp: 98.6 F (37 C)  97.9 F (36.6 C) 97.9 F (36.6 C)  TempSrc: Oral  Oral Oral  Resp: 18 20 18 18   Height:      Weight:      SpO2:   99% 100%   Alert but confused somewhat  oriented. Not in specific complaints. Patient had been fed by nursing aide.  General: Alert pleasant Cardiovascular: S1-S2 murmur left lower sternal edge 4/6 Respiratory: Clinically clear  Discharge Instructions  Discharge Orders   Future Orders Complete By Expires     Call MD for:  difficulty breathing, headache or visual disturbances  As directed     Call MD for:  hives  As directed     Call MD for:  persistant nausea and vomiting  As directed     Call MD for:  redness, tenderness, or signs of infection (pain, swelling, redness, odor or green/yellow discharge around incision site)  As directed     Call MD for:  severe uncontrolled pain  As directed     Diet - low sodium heart healthy  As directed     Scheduling Instructions:      Dysphagia 1 diet    Increase activity slowly  As directed         Medication List    STOP taking these medications       aspirin 81 MG tablet     enoxaparin 150 MG/ML injection  Commonly known as:  LOVENOX     simvastatin 40 MG tablet  Commonly known as:  ZOCOR      TAKE these medications       aspirin 81 MG chewable tablet  Chew 1 tablet (81 mg total) by mouth daily.     atorvastatin 40 MG tablet  Commonly known as:  LIPITOR  Take 1 tablet (40 mg total) by mouth daily.     clopidogrel 75 MG tablet  Commonly known as:  PLAVIX  Take 1 tablet (75 mg total) by mouth daily with breakfast.     feeding supplement Liqd  Take 237 mLs by mouth 2 (two) times daily between meals.     QUEtiapine 25 MG tablet  Commonly known as:  SEROQUEL  Take 1 tablet (25 mg total) by mouth 2 (two) times daily.     traMADol 50 MG tablet  Commonly known as:  ULTRAM  Take 50 mg by mouth every 6 (six) hours as needed. For pain           Follow-up Information   Follow up with Raymond Rigg, MD. Schedule an appointment as soon as possible for a visit in 2 months. (stroke clinic)    Contact information:   [address], [address] [facility] [place] [address] [phone]        The results of significant diagnostics from this hospitalization (including imaging, microbiology, ancillary and laboratory) are listed below for reference.    Significant Diagnostic Studies: Ct Head Wo Contrast  05/26/2012  *RADIOLOGY REPORT*  Clinical Data: Weakness  CT HEAD WITHOUT CONTRAST  Technique:  Contiguous axial images were obtained from the base of the skull through the vertex without contrast.  Comparison: 05/03/2011  Findings: There is prominence of the sulci  and ventricles consistent with brain atrophy.  There is mild low attenuation within the subcortical and periventricular white matter suggestive of chronic microvascular disease.  There is no evidence for acute brain infarct, hemorrhage or mass.  The mastoid air cells appear clear.  There is chronic mucoperiosteal thickening involving the left maxillary sinus.  IMPRESSION:  1.  No acute intracranial abnormalities. 2.  Brain atrophy. 3.  Chronic left maxillary sinus disease   Original Report Authenticated By: Signa Kell, M.D.    Mr [facility] Wo Contrast  05/27/2012  *RADIOLOGY REPORT*  Clinical Data: Right sided weakness.  MRA HEAD WITHOUT CONTRAST  Technique: Angiographic images of the Circle of Willis were obtained using MRA technique without intravenous contrast.  Comparison: No prior MRA intracranial examinations.  Findings: The images suffer from marked motion degradation.  The internal carotid arteries show gross patency as do the proximal anterior and middle cerebral arteries.  There is absent flow related enhancement of the vertebral arteries despite evidence for  their patency on MRI Brain exam.  This is artifactual and related to patient motion.  The basilar artery displays a 50% stenosis in its midportion.  The left posterior cerebral artery appears  occluded consistent with the observed pattern of infarction. There is poor flow related enhancement in the right PCA.   IMPRESSION: Severely motion degraded exam with artifactual signal loss in both vertebrals. Suspected 50% mid basilar stenosis.  Proximal occlusion left PCA.   Original Report Authenticated By: Davonna Belling, M.D.    Mr Angiogram Neck W Wo Contrast  05/27/2012  *RADIOLOGY REPORT*  Clinical Data:  Left brain stroke.  MRA NECK WITHOUT AND WITH CONTRAST  Technique:  Angiographic images of the neck were obtained using MRA technique without and with intravenous contrast.  Carotid stenosis measurements (when applicable) are obtained utilizing NASCET criteria, using the distal internal carotid diameter as the denominator.  Contrast: 13mL MULTIHANCE GADOBENATE DIMEGLUMINE 529 MG/ML IV SOLN  Comparison:   None.  Findings:  Moderate motion degradation.  Overall the study is diagnostic.  Conventional branching of the great vessels from the arch.  No proximal stenosis of the innominate, left common carotid, right common carotid, or either subclavian.  Vertebrals codominant with suspected bilateral 50% ostial stenoses.  Nonstenotic atheromatous change left carotid bifurcation.  Cervical ICA free of disease.  Slight posterior wall plaque right carotid bifurcation, nonstenotic.  Tortuous cervical ICA on the right. Mild nonstenotic atheromatous change distal left cavernous carotid.  There is a fairly severe corkscrew like deformity of the left vertebral opposite C2. It is unclear if this is flow reducing in this patient. The appearance could represent a manifestation of fibromuscular dysplasia.  Similar less severe changes may be present in the mid to upper vertebral on the right.  Both vertebrals contribute roughly equally to the basilar with mild nonstenotic irregularity of the distal left vertebral.  50% mid basilar stenosis is redemonstrated as is left PCA occlusion.  IMPRESSION: Fairly severe corkscrew like deformity of the left vertebral opposite C2; it is unclear if this is flow reducing, but could represent a manifestation  of fibromuscular dysplasia.  Similar less impressive changes may be present in the right vertebral.  No extracranial flow reducing carotid stenosis or carotid fibromuscular change is definitely identified.   Original Report Authenticated By: Davonna Belling, M.D.    Mr [facility] Wo Contrast  05/27/2012  *RADIOLOGY REPORT*  Clinical Data: New onset right-sided weakness and slurred speech. Stroke risk factors include hypercholesterolemia, diabetes, and hypertension.  MRI HEAD WITHOUT AND WITH CONTRAST  Technique:  Multiplanar, multiecho pulse sequences of the brain and surrounding structures were obtained according to standard protocol without and with intravenous contrast  Contrast:  MultiHance 13 ml.  Comparison: CT head 05/26/2012  Findings: The patient had difficulty remaining motionless for the study.  Images are suboptimal.  Small or subtle lesions could be overlooked.  There is a moderately large acute infarct affecting the left medial temporal lobe, left medial occipital lobe, and thalamus, and may involve the left posterior limb internal capsule.  There is no associated hemorrhage. No abnormal post contrast enhancement.  There is advanced atrophy and chronic microvascular ischemic change.  Remote infarcts left cerebellum, left thalamus and left basal ganglia.  Chronic microvascular ischemic change.  Grossly intact calvarium.  Left maxillary sinus disease.  IMPRESSION: Markedly degraded exam due to patient motion.  Acute left PCA territory infarct affects the thalamus, medial temporal lobe, occipital lobe, potentially affecting the left internal capsule. No visible hemorrhage.   Original Report Authenticated By: Davonna Belling, M.D.    Dg Chest Port 1 View  05/26/2012  *RADIOLOGY REPORT*  Clinical Data: Chest pain  PORTABLE CHEST - 1 VIEW  Comparison: 05/02/2011  Findings: Normal heart size.  No pleural effusion or edema.  There is no airspace consolidation identified.  Review of the visualized osseous structures  is significant for osteoarthritis involving both glenohumeral joints.  IMPRESSION:  1.  No acute cardiopulmonary abnormalities.   Original Report Authenticated By: Signa Kell, M.D.    Dg Swallowing Func-speech Pathology  05/29/2012  Lacinda Axon, CCC-SLP     05/29/2012 11:55 AM Objective Swallowing Evaluation: Modified Barium Swallowing Study   Patient Details  Name: Raymond Rangel MRN: [id number] Date of Birth: May 09, 1931  Today's Date: 05/29/2012 Time: 1000-1030 SLP Time Calculation (min): 30 min  Past Medical History:  Past Medical History  Diagnosis Date  . Mini stroke   . Irregular heart beat   . Blood clot in vein     left leg dvt  . Dementia   . DVT (deep venous thrombosis)   . Hypercholesteremia   . Arthritis   . Diabetes mellitus    Past Surgical History:  Past Surgical History  Procedure Laterality Date  . Left arm    . Rotator cuff repair  2000(L) 1999 (R)   HPI:  79 yo male h/o tia, dementia comes in with rt sided weakness for  less than 24 hours.  dtr is present.  He lives with his wife.  He  was normal yesterday (his baseline is walking with cane, able to  feed himself, normally can speak clearly ) but today he is more  confused than normal, cannot answer questions appropriately and  rt side of body weak.  No sz activity.  No fevers/n/v/d.  No  rashes.  He is confused but denies any pain.  History obtained  from dtr. MRI reveals Acute left PCA territory infarct affects  the thalamus, medial temporal lobe, occipital lobe, potentially  affecting the left internal capsule.  MBS  Indicated to assess  risk for aspiration and recommend safest, possible PO diet.       Assessment / Plan / Recommendation Clinical Impression  Dysphagia Diagnosis: Moderate oral phase dysphagia;Moderate  pharyngeal phase dysphagia Moderate sensory motor oral dysphagia marked by the following:  poor oral awareness, ineffective vertical mastication, decreased  lingual strength and coordination resulting in poor bolus  cohesion and  transit.  Majority of mechanical soft bolus wedged  on alveolar ridge requiring max verbal and tactile cues to remove  bolus with tongue sweep.  Whole barium tablet administered in  puree with tablet remaining on anterior portion of tongue.   Patient unable to transit pill and eventually expectorated pill.   Moderate sensory motor pharyngeal phase dysphagia.  Trace  penetration during swallow of nectar by cup and straw  consistently mainly due to residuals in pyriforms.  Initiation of  swallow with thin by cup and straw at pyriforms with trace silent  aspiration x1 out of multiple trials.  Prominent, to what  appeared to be, osteophyte at C-4 slowing bolus transit and  increasing residuals at level of pyriforms and CP segment.  Residuals at pyriforms cleared with cued second swallow.  Brief  esophageal screen indicates functional bolus transit.  Recommend  to proceed with dysphagia 1 (puree) and thin liquids with full  assist with each meals to ensure safety.  Recommend puree mainly  due to severity of oral dysphagia.  Please note patient required  hand over hand assist for self feeding with majority of PO trials  administered by treating SLP.  Only trace silent aspiration  occurred with nectar thick liquids and x1 with thin liquid but  patient was in optimal upright positioning.  Defer to upgrade  solids bedside to treating SLP with clinical improvement.  ST to  follow in acute care setting for diet tolerance and possible  advancement.  Diagnostic treatment completed after evaluation  focusing on providing education to caregivers on recommended  swallow strategies specific for patient to decrease risk of  aspiration.       Treatment Recommendation       Diet Recommendation Dysphagia 1 (Puree);Thin liquid   Liquid Administration via: Cup;Straw Medication Administration: Crushed with puree Supervision: Full supervision/cueing for compensatory  strategies;Staff feed patient Compensations: Slow rate;Small  sips/bites;Check for  pocketing;Multiple dry swallows after each bite/sip;Clear throat  intermittently Postural Changes and/or Swallow Maneuvers: Out of bed for  meals;Seated upright 90 degrees;Upright 30-60 min after meal    Other  Recommendations Oral Care Recommendations: Oral care  before and after PO Other Recommendations: Clarify dietary restrictions   Follow Up Recommendations  Inpatient Rehab    Frequency and Duration min 3x week  2 weeks       SLP Swallow Goals Patient will consume recommended diet without observed clinical  signs of aspiration with: Maximum assistance Patient will utilize recommended strategies during swallow to  increase swallowing safety with: Maximum assistance Swallow Study Goal #3 - Progress: Discontinued (comment)  (Following results of MBS )   General Date of Onset: 05/26/12 HPI: 79 yo male h/o tia, dementia comes in with rt sided weakness  for less than 24 hours.  dtr is present.  He lives with his wife.   He was normal yesterday (his baseline is walking with cane, able  to feed himself, normally can speak clearly ) but today he is  more confused than normal, cannot answer questions appropriately  and rt side of body weak.  No sz activity.  No fevers/n/v/d.  No  rashes.  He is confused but denies any pain.  History obtained  from dtr. MRI reveals Acute left PCA territory infarct affects  the thalamus, medial temporal lobe, occipital lobe, potentially  affecting the left internal capsule.  Type of Study: Modified Barium Swallowing Study Reason for Referral: Objectively evaluate swallowing function Previous Swallow Assessment: BSE 05/26/12 Diet Prior to this Study: NPO Temperature Spikes Noted: No Respiratory Status: Supplemental  O2 delivered via (comment)  (nasal cannula  2Liter ) History of Recent Intubation: No Behavior/Cognition: Alert;Cooperative;Pleasant mood;Requires  cueing;Confused Oral Cavity - Dentition: Dentures, top Oral Motor / Sensory Function: Impaired - see Bedside  swallow  eval Self-Feeding Abilities: Total assist Patient Positioning: Upright in chair Baseline Vocal Quality: Clear Volitional Cough: Strong Volitional Swallow: Unable to elicit Anatomy: Within functional limits Pharyngeal Secretions: Not observed secondary MBS    Reason for Referral Objectively evaluate swallowing function   Oral Phase Oral Preparation/Oral Phase Oral Phase: Impaired Oral - Nectar Oral - Nectar Teaspoon: Lingual pumping;Incomplete tongue to  palate contact;Reduced posterior propulsion;Delayed oral  transit;Decreased velopharyngeal closure;Piecemeal  swallowing;Lingual/palatal residue;Weak lingual manipulation Oral - Nectar Cup: Lingual pumping;Incomplete tongue to palate  contact;Reduced posterior propulsion;Lingual/palatal  residue;Piecemeal swallowing;Decreased velopharyngeal  closure;Delayed oral transit;Weak lingual manipulation Oral - Nectar Straw: Lingual pumping;Incomplete tongue to palate  contact;Reduced posterior propulsion;Lingual/palatal  residue;Piecemeal swallowing;Decreased velopharyngeal  closure;Holding of bolus;Delayed oral transit;Weak lingual  manipulation Oral - Thin Oral - Thin Teaspoon: Weak lingual manipulation;Lingual  pumping;Incomplete tongue to palate contact;Reduced posterior  propulsion;Holding of bolus;Delayed oral transit;Decreased  velopharyngeal closure;Piecemeal swallowing;Lingual/palatal  residue Oral - Thin Cup: Lingual pumping;Incomplete tongue to palate  contact;Reduced posterior propulsion;Weak lingual  manipulation;Holding of bolus;Delayed oral transit;Decreased  velopharyngeal closure;Piecemeal swallowing;Lingual/palatal  residue Oral - Thin Straw: Lingual pumping;Weak lingual  manipulation;Incomplete tongue to palate contact;Reduced  posterior propulsion;Holding of bolus;Decreased velopharyngeal  closure;Piecemeal swallowing;Lingual/palatal residue;Delayed oral  transit Oral - Solids Oral - Puree: Lingual pumping;Incomplete tongue to palate   contact;Weak lingual manipulation;Delayed oral transit;Decreased  velopharyngeal closure;Piecemeal swallowing;Lingual/palatal  residue;Reduced posterior propulsion Oral - Mechanical Soft: Impaired mastication;Weak lingual  manipulation;Incomplete tongue to palate contact;Lingual  pumping;Lingual/palatal residue;Piecemeal swallowing;Decreased  velopharyngeal closure;Holding of bolus;Reduced posterior  propulsion;Delayed oral transit Oral - Pill: Lingual pumping;Weak lingual manipulation;Holding of  bolus   Pharyngeal Phase Pharyngeal Phase Pharyngeal Phase: Impaired Pharyngeal - Nectar Pharyngeal - Nectar Teaspoon: Premature spillage to  valleculae;Reduced pharyngeal peristalsis;Reduced anterior  laryngeal mobility;Reduced tongue base retraction;Reduced  laryngeal elevation;Pharyngeal residue - pyriform  sinuses;Pharyngeal residue - cp segment Pharyngeal - Nectar Cup: Premature spillage to valleculae;Reduced  pharyngeal peristalsis;Reduced laryngeal elevation;Reduced  airway/laryngeal closure;Reduced tongue base  retraction;Penetration/Aspiration during swallow;Trace  aspiration;Pharyngeal residue - pyriform sinuses Penetration/Aspiration details (nectar cup): Material enters  airway, CONTACTS cords and not ejected out Pharyngeal - Nectar Straw: Premature spillage to pyriform  sinuses;Reduced laryngeal elevation;Reduced airway/laryngeal  closure;Reduced tongue base retraction;Reduced pharyngeal  peristalsis;Reduced anterior laryngeal  mobility;Penetration/Aspiration during swallow;Trace  aspiration;Pharyngeal residue - posterior pharnyx;Pharyngeal  residue - valleculae;Pharyngeal residue - pyriform sinuses Penetration/Aspiration details (nectar straw): Material enters  airway, CONTACTS cords and not ejected out Pharyngeal - Thin Pharyngeal - Thin Teaspoon: Premature spillage to  valleculae;Reduced pharyngeal peristalsis;Reduced epiglottic  inversion;Reduced anterior laryngeal mobility;Reduced laryngeal   elevation;Reduced airway/laryngeal closure;Reduced tongue base  retraction;Pharyngeal residue - pyriform sinuses;Pharyngeal  residue - valleculae Pharyngeal - Thin Cup: Reduced laryngeal elevation;Reduced  pharyngeal peristalsis;Premature spillage to pyriform  sinuses;Reduced anterior laryngeal  mobility;Penetration/Aspiration during swallow;Reduced  airway/laryngeal closure;Reduced tongue base  retraction;Pharyngeal residue - pyriform sinuses Penetration/Aspiration details (thin cup): Material enters  airway, remains ABOVE vocal cords and not ejected out Pharyngeal - Thin Straw: Premature spillage to pyriform  sinuses;Reduced pharyngeal peristalsis;Reduced anterior laryngeal  mobility;Reduced laryngeal elevation;Reduced tongue base  retraction Pharyngeal - Solids Pharyngeal - Puree: Premature spillage to valleculae;Reduced  pharyngeal peristalsis;Reduced laryngeal elevation;Reduced  airway/laryngeal closure;Reduced tongue base  retraction;Pharyngeal residue - valleculae;Pharyngeal residue -  pyriform sinuses;Pharyngeal residue - posterior pharnyx Pharyngeal - Mechanical Soft: Premature spillage to  valleculae;Reduced pharyngeal  peristalsis;Reduced laryngeal  elevation;Reduced airway/laryngeal closure;Reduced tongue base  retraction;Pharyngeal residue - pyriform sinuses  Cervical Esophageal Phase    GO    Cervical Esophageal Phase Cervical Esophageal Phase: Impaired Cervical Esophageal Phase - Nectar Nectar Cup: Reduced cricopharyngeal relaxation Cervical Esophageal Phase - Solids Puree: Reduced cricopharyngeal relaxation Mechanical Soft: Reduced cricopharyngeal relaxation        Moreen Fowler M.S., CCC-SLP [phone] [facility] 05/29/2012, 11:07 AM      Microbiology: Recent Results (from the past 240 hour(s))  URINE CULTURE     Status: None   Collection Time    05/27/12 10:49 AM      Result Value Range Status   Specimen Description URINE, CATHETERIZED   Final   Special Requests NONE   Final   Culture   Setup Time 05/27/2012 20:14   Final   Colony Count NO GROWTH   Final   Culture NO GROWTH   Final   Report Status 05/28/2012 FINAL   Final     Labs: Basic Metabolic Panel:  Recent Labs Lab 05/26/12 2031 05/26/12 2101 05/28/12 0425 05/29/12 0545  NA 139 145 143 142  K 3.6 3.5 3.4* 3.6  CL 106 111 109 110  CO2 22  --  21 19  GLUCOSE 109* 111* 93 82  BUN 16 16 8 7   CREATININE 1.04 1.10 0.84 0.83  CALCIUM 9.4  --  8.9 8.7   Liver Function Tests:  Recent Labs Lab 05/26/12 2031  AST 19  ALT 9  ALKPHOS 98  BILITOT 0.3  PROT 7.9  ALBUMIN 4.1   No results found for this basename: LIPASE, AMYLASE,  in the last 168 hours No results found for this basename: AMMONIA,  in the last 168 hours CBC:  Recent Labs Lab 05/26/12 2031 05/26/12 2101 05/28/12 0425  WBC 13.0*  --  9.2  NEUTROABS 9.9*  --   --   HGB 14.5 15.0 13.4  HCT 42.7 44.0 39.4  MCV 85.4  --  86.2  PLT 181  --  178   Cardiac Enzymes:  Recent Labs Lab 05/26/12 2031 05/26/12 2032  CKTOTAL 229  --   TROPONINI  --  <0.30   BNP: BNP (last 3 results) No results found for this basename: PROBNP,  in the last 8760 hours CBG:  Recent Labs Lab 05/29/12 1002 05/29/12 1834 05/29/12 2029 05/29/12 2346 05/30/12 0349  GLUCAP 80 114* 123* 92 105*       Signed:  Velera Lansdale, [id number]  [facility] 05/30/2012, 2:07 PM

## 2012-05-30 NOTE — Progress Notes (Signed)
Occupational Therapy Treatment Patient Details Name: Raymond Rangel MRN: [id number] DOB: 1932-02-16 Today's Date: 05/30/2012 Time: 2952-8413 OT Time Calculation (min): 24 min  OT Assessment / Plan / Recommendation Comments on Treatment Session Pt much more alert and able to participate this session compared to evaluation.  Improvement noted in all areas of mobility, sitting balance in prep for ADL, and voluntary movement of R UE. Impaired motor planning.     Follow Up Recommendations  CIR    Barriers to Discharge       Equipment Recommendations       Recommendations for Other Services    Frequency Min 3X/week   Plan Discharge plan remains appropriate    Precautions / Restrictions Precautions Precautions: Fall   Pertinent Vitals/Pain No pain    ADL  Upper Body Bathing: +1 Total assistance Where Assessed - Upper Body Bathing: Unsupported sitting;Supported sitting Lower Body Bathing: +1 Total assistance Where Assessed - Lower Body Bathing: Supported sit to stand Upper Body Dressing: Maximal assistance Where Assessed - Upper Body Dressing: Supported sitting Equipment Used: Gait belt Transfers/Ambulation Related to ADLs: +2 total assist pt 40% stand pivot with assist weight shift and to move R LE, tends to lock in extension    OT Diagnosis:    OT Problem List:   OT Treatment Interventions:     OT Goals ADL Goals Pt Will Perform Grooming: with min assist;Sitting, chair Pt Will Transfer to Toilet: with mod assist;Stand pivot transfer;3-in-1 ADL Goal: Toilet Transfer - Progress: Progressing toward goals Miscellaneous OT Goals Miscellaneous OT Goal #1: Pt will perform supine to sit with mod assist in prep for ADL. OT Goal: Miscellaneous Goal #1 - Progress: Partly met (continue goal) Miscellaneous OT Goal #2: Pt will sit EOB with min guard assist while engaged in ADL x 10 minutes. OT Goal: Miscellaneous Goal #2 - Progress: Progressing toward goals Miscellaneous OT Goal #3: Pt's  caregivers will be independent in positioning R UE in chair and bed to prevent injury and deformity. OT Goal: Miscellaneous Goal #3 - Progress: Progressing toward goals Miscellaneous OT Goal #4: Pt will follow 1 step commands during ADL and mobility 100% of time. OT Goal: Miscellaneous Goal #4 - Progress: Progressing toward goals Miscellaneous OT Goal #5: Pt will visually locate objects in R hemispace with min verbal and auditory cues. OT Goal: Miscellaneous Goal #5 - Progress: Progressing toward goals  Visit Information  Last OT Received On: 05/30/12 Assistance Needed: +2 PT/OT Co-Evaluation/Treatment: Yes    Subjective Data      Prior Functioning       Cognition  Cognition Overall Cognitive Status: Impaired Area of Impairment: Memory;Following commands;Awareness of errors;Safety/judgement Arousal/Alertness: Awake/alert Behavior During Session: [facility] for tasks performed Memory Deficits: does not recall staff when they leave the room and return Following Commands: Follows one step commands inconsistently (vs apraxia) Safety/Judgement: Decreased awareness of safety precautions;Decreased safety judgement for tasks assessed Awareness of Errors: Assistance required to identify errors made;Assistance required to correct errors made    Mobility  Bed Mobility Bed Mobility: Supine to Sit;Sitting - Scoot to Edge of Bed Supine to Sit: 3: Mod assist;HOB elevated Sitting - Scoot to Edge of Bed: 2: Max assist Details for Bed Mobility Assistance: Improvement noted in ability to move LEs to R side of bed and elevate torso.  Needs verbal cues throughout. Transfers Transfers: Sit to Stand;Stand to Sit Sit to Stand: 1: +2 Total assist;With upper extremity assist;From bed Sit to Stand: Patient Percentage: 50% Stand to Sit: 1: +2  Total assist;With upper extremity assist;To bed;To chair/3-in-1 Stand to Sit: Patient Percentage: 50% Details for Transfer Assistance: Pt unable to take pivotal steps  from bed>recliner.       Exercises      Balance Balance Balance Assessed: Yes Static Sitting Balance Static Sitting - Balance Support: Feet supported;No upper extremity supported Static Sitting - Level of Assistance: 5: Stand by assistance;3: Mod assist (tends to lean posterior and to the R, pushes with L UE) Static Sitting - Comment/# of Minutes: 10 Static Standing Balance Static Standing - Balance Support: Bilateral upper extremity supported Static Standing - Level of Assistance: 1: +2 Total assist Static Standing - Comment/# of Minutes: 3   End of Session OT - End of Session Equipment Utilized During Treatment: Gait belt Activity Tolerance: Patient tolerated treatment well Patient left: in chair;with call bell/phone within reach;with family/visitor present;with nursing in room;with chair alarm set  GO     Evern Bio 05/30/2012, 11:50 AM [phone]

## 2012-05-30 NOTE — Progress Notes (Signed)
Physical Therapy Treatment Patient Details Name: Raymond Rangel MRN: [id number] DOB: 1931/04/23 Today's Date: 05/30/2012 Time: 4098-1191 PT Time Calculation (min): 24 min  PT Assessment / Plan / Recommendation Comments on Treatment Session  Pt required a little more (A) for mobility today as well as Rt LE extensor tone more than yesterday's session however his ability to communicate much improved.  Cont to strongly recommend CIR prior to d/c home to maximize functional recovery    Follow Up Recommendations  CIR     Does the patient have the potential to tolerate intense rehabilitation     Barriers to Discharge        Equipment Recommendations  Wheelchair (measurements PT);Wheelchair cushion (measurements PT)    Recommendations for Other Services Rehab consult  Frequency Min 4X/week   Plan Discharge plan remains appropriate    Precautions / Restrictions Precautions Precautions: Fall Precaution Comments: increased tone right side (extensor in leg, flexor in arm) Restrictions Weight Bearing Restrictions: No       Mobility  Bed Mobility Bed Mobility: Supine to Sit;Sitting - Scoot to Edge of Bed Supine to Sit: 3: Mod assist;HOB elevated Sitting - Scoot to Edge of Bed: 2: Max assist Details for Bed Mobility Assistance: Improvement noted in ability to move LEs to R side of bed and elevate torso.  Needs verbal cues throughout. Transfers Transfers: Sit to Stand;Stand to Sit;Stand Pivot Transfers Sit to Stand: 1: +2 Total assist;With upper extremity assist;From bed Sit to Stand: Patient Percentage: 50% Stand to Sit: 1: +2 Total assist;With upper extremity assist;To bed;To chair/3-in-1 Stand to Sit: Patient Percentage: 50% Details for Transfer Assistance: Pt flexed at hips with feet out in front of body with hips posterior.  Facilitation for upright posture & blocking of feet from sliding forwards.  Pt unable to take pivotal steps therefore required manual (A) to move LE's rather than  pivoting with feet stationary.   Ambulation/Gait Ambulation/Gait Assistance: Not tested (comment) Modified Rankin (Stroke Patients Only) Pre-Morbid Rankin Score: Moderate disability Modified Rankin: Severe disability     PT Goals Acute Rehab PT Goals Time For Goal Achievement: 06/10/12 Potential to Achieve Goals: Good Pt will go Supine/Side to Sit: with supervision PT Goal: Supine/Side to Sit - Progress: Not met Pt will Sit at [facility] of Bed: with supervision;6-10 min;with no upper extremity support PT Goal: Sit at Edge Of Bed - Progress: Progressing toward goal Pt will go Sit to Supine/Side: with supervision Pt will go Sit to Stand: with min assist PT Goal: Sit to Stand - Progress: Not met Pt will go Stand to Sit: with min assist PT Goal: Stand to Sit - Progress: Not met Pt will Transfer Bed to Chair/Chair to Bed: with min assist PT Transfer Goal: Bed to Chair/Chair to Bed - Progress: Not met Pt will Ambulate: 16 - 50 feet;with mod assist;with least restrictive assistive device  Visit Information  Last PT Received On: 05/30/12 Assistance Needed: +2    Subjective Data      Cognition  Cognition Overall Cognitive Status: Impaired Area of Impairment: Memory;Following commands;Awareness of errors;Safety/judgement Arousal/Alertness: Awake/alert Behavior During Session: [facility] for tasks performed Memory Deficits: does not recall staff when they leave the room and return Following Commands: Follows one step commands inconsistently (vs apraxia) Safety/Judgement: Decreased awareness of safety precautions;Decreased safety judgement for tasks assessed Awareness of Errors: Assistance required to identify errors made;Assistance required to correct errors made    Balance  Static Sitting Balance Static Sitting - Balance Support: Feet supported;No upper extremity supported  Static Sitting - Level of Assistance: 5: Stand by assistance;3: Mod assist (tends to lean posterior & to the Rt, pushes  with L UE.  ) Static Sitting - Comment/# of Minutes: 10 mins.  cues & facilitation for sitting midline & increased upright posture.  Pt able to correct 75% of time with VC's.  Tends to lean posteriorly & to Rt .   Static Standing Balance Static Standing - Balance Support: Bilateral upper extremity supported Static Standing - Level of Assistance: 1: +2 Total assist Static Standing - Comment/# of Minutes: 3  End of Session PT - End of Session Equipment Utilized During Treatment: Gait belt Activity Tolerance: Patient tolerated treatment well Patient left: in chair;with call bell/phone within reach;with family/visitor present Nurse Communication: Mobility status     Verdell Face, [place] [phone] 05/30/2012

## 2012-05-31 ENCOUNTER — Inpatient Hospital Stay (HOSPITAL_COMMUNITY): Payer: Medicare Other

## 2012-05-31 ENCOUNTER — Inpatient Hospital Stay (HOSPITAL_COMMUNITY): Payer: Medicare Other | Admitting: Speech Pathology

## 2012-05-31 ENCOUNTER — Inpatient Hospital Stay (HOSPITAL_COMMUNITY): Payer: Medicare Other | Admitting: Occupational Therapy

## 2012-05-31 DIAGNOSIS — T6701XA Heatstroke and sunstroke, initial encounter: Secondary | ICD-10-CM

## 2012-05-31 DIAGNOSIS — I69998 Other sequelae following unspecified cerebrovascular disease: Secondary | ICD-10-CM

## 2012-05-31 DIAGNOSIS — I69398 Other sequelae of cerebral infarction: Secondary | ICD-10-CM

## 2012-05-31 DIAGNOSIS — H53469 Homonymous bilateral field defects, unspecified side: Secondary | ICD-10-CM

## 2012-05-31 DIAGNOSIS — G8110 Spastic hemiplegia affecting unspecified side: Secondary | ICD-10-CM

## 2012-05-31 DIAGNOSIS — I6330 Cerebral infarction due to thrombosis of unspecified cerebral artery: Secondary | ICD-10-CM

## 2012-05-31 DIAGNOSIS — R209 Unspecified disturbances of skin sensation: Secondary | ICD-10-CM

## 2012-05-31 LAB — GLUCOSE, CAPILLARY
Glucose-Capillary: 97 mg/dL (ref 70–99)
Glucose-Capillary: 102 mg/dL — ABNORMAL HIGH (ref 70–99)
Glucose-Capillary: 76 mg/dL (ref 70–99)
Glucose-Capillary: 103 mg/dL — ABNORMAL HIGH (ref 70–99)

## 2012-05-31 LAB — CBC WITH DIFFERENTIAL/PLATELET
Basophils Absolute: 0.1 10*3/uL (ref 0.0–0.1)
HCT: 42.4 % (ref 39.0–52.0)
Hemoglobin: 14.2 g/dL (ref 13.0–17.0)
Lymphocytes Relative: 17 % (ref 12–46)
Monocytes Absolute: 0.9 10*3/uL (ref 0.1–1.0)
Neutro Abs: 8.7 10*3/uL — ABNORMAL HIGH (ref 1.7–7.7)
RDW: 15.2 % (ref 11.5–15.5)
WBC: 12.2 10*3/uL — ABNORMAL HIGH (ref 4.0–10.5)

## 2012-05-31 LAB — COMPREHENSIVE METABOLIC PANEL
ALT: 15 U/L (ref 0–53)
AST: 23 U/L (ref 0–37)
Alkaline Phosphatase: 107 U/L (ref 39–117)
CO2: 24 mEq/L (ref 19–32)
Chloride: 102 mEq/L (ref 96–112)
Creatinine, Ser: 0.91 mg/dL (ref 0.50–1.35)
GFR calc non Af Amer: 79 mL/min — ABNORMAL LOW (ref >90)
Potassium: 3.6 mEq/L (ref 3.5–5.1)
Sodium: 139 mEq/L (ref 135–145)
Total Bilirubin: 0.5 mg/dL (ref 0.3–1.2)

## 2012-05-31 MED ORDER — ENSURE COMPLETE PO LIQD
237.0000 mL | Freq: Two times a day (BID) | ORAL | Status: DC
  Administered 2012-05-31 – 2012-06-25 (×39): 237 mL via ORAL

## 2012-05-31 MED ORDER — ENOXAPARIN SODIUM 30 MG/0.3ML ~~LOC~~ SOLN
30.0000 mg | Freq: Two times a day (BID) | SUBCUTANEOUS | Status: DC
  Administered 2012-05-31 – 2012-06-25 (×51): 30 mg via SUBCUTANEOUS
  Filled 2012-05-31 (×57): qty 0.3

## 2012-05-31 NOTE — Progress Notes (Signed)
Patient information reviewed and entered into eRehab system by Kelli Robeck, RN, CRRN, PPS Coordinator.  Information including medical coding and functional independence measure will be reviewed and updated through discharge.     Per nursing patient was given "Data Collection Information Summary for Patients in Inpatient Rehabilitation Facilities with attached "Privacy Act Statement-Health Care Records" upon admission.  

## 2012-05-31 NOTE — Evaluation (Signed)
Occupational Therapy Assessment and Plan  Patient Details  Name: Raymond Rangel MRN: [id number] Date of Birth: 12/25/1931  OT Diagnosis: abnormal posture, apraxia, ataxia, cognitive deficits, disturbance of vision and hemiplegia affecting dominant side Rehab Potential: Rehab Potential: Fair ELOS: 2 weeks   Today's Date: 05/31/2012 Time: 1003-1100 Time Calculation (min): 57 min  1:1 Pt seen for initial evaluation and ADL retraining of B/D from bed level. Pt required total assistance with all tasks.     Problem List:  Patient Active Problem List  Diagnosis  . Right shoulder pain  . DVT (deep venous thrombosis)  . CAD (coronary artery disease)  . Hyperlipidemia  . Dementia  . Diabetes mellitus, type 2  . CVA (cerebral infarction)  . Heat stroke  . Homonymous hemianopsia due to recent stroke    Past Medical History:  Past Medical History  Diagnosis Date  . Mini stroke   . Irregular heart beat   . Blood clot in vein     left leg dvt  . Dementia   . DVT (deep venous thrombosis)   . Hypercholesteremia   . Arthritis   . Diabetes mellitus    Past Surgical History:  Past Surgical History  Procedure Laterality Date  . Left arm    . Rotator cuff repair  2000(L) 1999 (R)    Assessment & Plan Clinical Impression: Raymond Rangel is a 79 y.o. right-handed male history of TIA, diabetes mellitus and dementia. 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 Admitted 05/26/2012 with right-sided weakness and slurred speech. MRI shows acute left PCA territory infarcts without hemorrhage. MRA of the head with 50% mid basilar stenosis. Proximal occlusion left PCA. Echocardiogram with ejection fraction 60% and grade 1 diastolic dysfunction. Carotid Dopplers with no ICA stenosis. Patient did not receive TPA. Neurology consulted patient maintained on aspirin 81 mg daily as prior to admission and Plavix added to regimen. Subcutaneous Lovenox for DVT prophylaxis. Currently  patient is maintained on a dysphagia 1 thin liquid diet after modified barium swallow study 05/28/2012.Monitoring of cognition with noted history of dementia placed on Seroquel. Patient transferred to CIR on 05/30/2012 .    Patient currently requires total with basic self-care skills secondary to decreased oxygen support, abnormal tone, motor apraxia, ataxia and decreased coordination, decreased visual perceptual skills and field cut, right side neglect, decreased motor planning and ideational apraxia, decreased initiation, decreased attention, decreased awareness, decreased problem solving, decreased safety awareness, decreased memory and delayed processing and decreased sitting balance, decreased postural control and hemiplegia.  Prior to hospitalization, patient reported that his family assisted him with ADLs .  Family was not present to verify this. Patient will benefit from skilled intervention to decrease level of assist with basic self-care skills prior to discharge to a SNF or home with care partner if assistance is available..  Anticipate patient will require 24 hour supervision and moderate physical assestance and follow up home health.  OT Assessment Rehab Potential: Fair Barriers to Discharge: Other (comment) Barriers to Discharge Comments: Pt will require a great deal of physical assistance. OT Plan OT Intensity: Minimum of 1-2 x/day, 45 to 90 minutes OT Frequency: 5 out of 7 days OT Duration/Estimated Length of Stay: 2 weeks OT Treatment/Interventions: Balance/vestibular training;Cognitive remediation/compensation;Discharge planning;DME/adaptive equipment instruction;Functional mobility training;Neuromuscular re-education;Patient/family education;Self Care/advanced ADL retraining;Therapeutic Activities;Therapeutic Exercise;UE/LE Strength taining/ROM;UE/LE Coordination activities;Visual/perceptual remediation/compensation OT Recommendation Patient destination: Skilled Nursing Facility  (SNF) Follow Up Recommendations: Skilled nursing facility   Skilled Therapeutic Intervention   OT Evaluation  Precautions/Restrictions  Precautions Precautions: Fall    Pain Pain Assessment Pain Assessment: No/denies pain Home Living/Prior Functioning Home Living Lives With: Spouse Available Help at Discharge: Family;Available 24 hours/day Type of Home: House Home Access: Stairs to enter [employer] of Steps: 3 Entrance Stairs-Rails: Right;Left Home Layout: One level Bathroom Shower/Tub: Walk-in shower;Door [employer] Toilet: Pharmacist, community: No Home Adaptive Equipment: Bedside commode/3-in-1;Walker - rolling;Quad cane Additional Comments: goes to senior center every day Prior Function Level of Independence:  (no family present to verify level of independence PTA) ADL Total assist   Vision/Perception  Vision - History Patient Visual Report: Blurring of vision (pt "sometimes it is blurry") Vision - Assessment Vision Assessment: Vision impaired - to be further tested in functional context Additional Comments: appears to have right visual field cut Perception Perception: Impaired Inattention/Neglect: Does not attend to right visual field;Does not attend to right side of body Praxis Praxis: Impaired Praxis Impairment Details: Motor planning;Initiation  Cognition Overall Cognitive Status: Impaired at baseline Arousal/Alertness: Awake/alert Orientation Level: Oriented to person;Disoriented to place;Disoriented to time;Disoriented to situation Memory: Impaired Memory Impairment: Storage deficit;Retrieval deficit;Decreased short term memory Awareness: Impaired Awareness Impairment: Intellectual impairment Safety/Judgment: Impaired Comments: pt is not aware of physical limitations Sensation Sensation Light Touch:  (difficult to assess due to dementia) Stereognosis: Not tested Hot/Cold: Not tested Proprioception:  (difficult to assess due to  dementia) Coordination Gross Motor Movements are Fluid and Coordinated: No Fine Motor Movements are Fluid and Coordinated: No Coordination and Movement Description: pt attempts to move RUE spontaneously (not on command) but movements are ataxic Heel Shin Test: unable to assess due to dementia Motor  Motor Motor: Hemiplegia;Ataxia;Motor apraxia;Abnormal tone;Abnormal postural alignment and control Mobility  Bed Mobility Supine to Sit: 2: Max assist Transfers Sit to Stand: 1: +2 Total assist;With upper extremity assist;From bed  Trunk/Postural Assessment  Cervical Assessment Cervical Assessment: Within Functional Limits Thoracic Assessment Thoracic Assessment: Within Functional Limits Lumbar Assessment Lumbar Assessment: Exceptions to [facility] (posterior pelvic tilt at all times in sitting) Postural Control Postural Control: Deficits on evaluation Protective Responses: delayed and inadequate Postural Limitations: leans L and posteriorly in sitting; RLe rigid in sitting and supine  Balance Static Sitting Balance Static Sitting - Balance Support: Feet supported Static Sitting - Level of Assistance: 1: +1 Total assist Extremity/Trunk Assessment RUE Assessment RUE Assessment: Exceptions to [facility] RUE Tone RUE Tone: Moderate;Hypertonic LUE Assessment LUE Assessment: Exceptions to [facility] LUE AROM (degrees) Left Shoulder Flexion: 90 Degrees  FIM:  FIM - Grooming Grooming: 1: Patient completes 0 of 4 or 1 of 5 steps, or requires 2 helpers FIM - Bathing Bathing: 1: Total-Patient completes 0-2 of 10 parts or less than 25% FIM - Upper Body Dressing/Undressing Upper body dressing/undressing: 1: Total-Patient completed less than 25% of tasks FIM - Lower Body Dressing/Undressing Lower body dressing/undressing: 1: Total-Patient completed less than 25% of tasks FIM - Toileting Toileting: 0: Activity did not occur FIM - Archivist Transfers: 0-Activity did not occur FIM -  IT consultant Transfers: 0-Activity did not occur or was simulated   Refer to Care Plan for Long Term Goals  Recommendations for other services: None  Discharge Criteria: Patient will be discharged from OT if patient refuses treatment 3 consecutive times without medical reason, if treatment goals not met, if there is a change in medical status, if patient makes no progress towards goals or if patient is discharged from hospital.  The above assessment, treatment plan, treatment alternatives and goals were discussed and mutually  agreed upon: No family available/patient unable  [facility] 05/31/2012, 12:18 PM

## 2012-05-31 NOTE — Evaluation (Signed)
Physical Therapy Assessment and Plan  Patient Details  Name: Mattthew Ziomek MRN: [id number] Date of Birth: 1931-12-20  PT Diagnosis: Abnormal posture, Difficulty walking, Hemiparesis dominant, Hypertonia and Impaired cognition Rehab Potential: Fair ELOS: 2 weeks , possibly longer if pt demonstrates ability to improve mobility  Today's Date: 05/31/2012 Time:0905-1000 0905-1000 Time Calculation (min): 55 and 30 min  Problem List:  Patient Active Problem List  Diagnosis  . Right shoulder pain  . DVT (deep venous thrombosis)  . CAD (coronary artery disease)  . Hyperlipidemia  . Dementia  . Diabetes mellitus, type 2  . CVA (cerebral infarction)  . Heat stroke  . Homonymous hemianopsia due to recent stroke    Past Medical History:  Past Medical History  Diagnosis Date  . Mini stroke   . Irregular heart beat   . Blood clot in vein     left leg dvt  . Dementia   . DVT (deep venous thrombosis)   . Hypercholesteremia   . Arthritis   . Diabetes mellitus    Past Surgical History:  Past Surgical History  Procedure Laterality Date  . Left arm    . Rotator cuff repair  2000(L) 1999 (R)    Assessment & Plan Clinical Impression: Valentine Barney is a 79 y.o. right-handed male history of TIA, diabetes mellitus and dementia. 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 Admitted 05/26/2012 with right-sided weakness and slurred speech. MRI shows acute left PCA territory infarcts without hemorrhage. MRA of the head with 50% mid basilar stenosis. Proximal occlusion left PCA. Echocardiogram with ejection fraction 60% and grade 1 diastolic dysfunction. Carotid Dopplers with no ICA stenosis. Patient did not receive TPA. Neurology consulted patient maintained on aspirin 81 mg daily as prior to admission and Plavix added to regimen. Subcutaneous Lovenox for DVT prophylaxis. Currently patient is maintained on a dysphagia 1 thin liquid diet after modified barium swallow study  05/28/2012.Monitoring of cognition with noted history of dementia placed on Seroquel. Patient transferred to CIR on 05/30/2012 .   Patient currently requires total assist with mobility secondary to impaired timing and sequencing, abnormal tone, unbalanced muscle activation, motor apraxia and decreased motor planning, decreased visual perceptual skills, field cut and hemianopsia, decreased midline orientation, decreased attention to right and decreased motor planning and decreased initiation, decreased attention, decreased awareness, decreased problem solving, decreased safety awareness, decreased memory and delayed processing.  Prior to hospitalization, patient was supervision with mobility and lived with Spouse in a House home.  He attended daily adult day care.  Home access is 3Stairs to enter.  Patient will benefit from skilled PT intervention to maximize safe functional mobility, minimize fall risk and decrease caregiver burden for planned discharge mod assist level. Wife is unable to assist pt; she recently had a hip replacement.  A daughter can provide some physical assistance.  Recommend a 2 week admission to assess pt's ability to improve mobility.   Anticipate patient will benefit from follow up HH vs SNF at discharge. PT - End of Session Activity Tolerance: Tolerates 10 - 20 min activity with multiple rests Endurance Deficit: Yes PT Assessment Rehab Potential: Fair Barriers to Discharge: Decreased caregiver support (dementia; resistance to movement) PT Plan PT Intensity: Minimum of 1-2 x/day ,45 to 90 minutes PT Frequency: 5 out of 7 days PT Duration Estimated Length of Stay: 2 weeks PT Treatment/Interventions: Ambulation/gait training;Balance/vestibular training;Cognitive remediation/compensation;Discharge planning;DME/adaptive equipment instruction;Functional mobility training;Patient/family education;Neuromuscular re-education;Splinting/orthotics;Therapeutic Exercise;Therapeutic  Activities;Stair training;UE/LE Strength taining/ROM;UE/LE Coordination activities;Visual/perceptual remediation/compensation;Wheelchair propulsion/positioning  PT Recommendation Follow Up Recommendations: Home health PT Patient destination: Home Equipment Recommended: Rolling walker with 5" wheels;Wheelchair (measurements);Wheelchair cushion (measurements) Skilled Therapeutic Intervention:   AM: static sitting balance EOB with max assist due to tendency to extend RLE rigidly, and slide forward;  After multiple attempts at placing R foot, pt was able to isolate R knee flexion, place R foot where indicated, and sit with min assist .  R lateral leans in sitting for RUe wt bearing, forced use, with mod assist .    PM: gait in parallel bars +2>+3 for wt shifting, RLe advancement, bil UE advancement.  Pt held RLe rigid on floor, but when shifted off of it, did not resist passive advancement.  Attempted gait up 1 step with 2 rails, L foot leading, +2 assist.  Pt was unable to shift wt to R in order to lift L foot.  neuromuscular re-education via forced use, demo for toss/catch using bil hands in sitting.  Pt did not spontaneously use R hand, although later when safety belt was being placed around him, he lifted bil UEs spontaneously.    PT Evaluation Precautions/Restrictions- fall; dementia; resists movement   General   Vital SignsTherapy Vitals Pulse Rate: 73 Patient Position, if appropriate: Sitting Oxygen Therapy SpO2: 80 %, checked with fingertip pulse oximeter O2 Device: Nasal cannula O2 Flow Rate (L/min): 2 L/min; increased to 3 L/min; RN notified; rechecked with [employer], which also read 80%, although pt does not appear cyanotic, and lungs are clear Pain Pain Assessment Pain Assessment: No/denies pain Home Living/Prior Functioning Home Living Lives With: Spouse Available Help at Discharge: Family;Available 24 hours/day Type of Home: House Home Access: Stairs to enter [employer] of Steps: 3 Entrance Stairs-Rails: Right;Left Home Layout: One level Bathroom Shower/Tub: Walk-in shower;Door [employer] Toilet: Pharmacist, community: No Home Adaptive Equipment: Bedside commode/3-in-1;Walker - rolling;Quad cane Additional Comments: goes to senior center every day Prior Function Level of Independence:  (no family present to verify level of independence PTA) Vision/Perception  Vision - History Patient Visual Report: Blurring of vision (pt "sometimes it is blurry") Vision - Assessment Vision Assessment: Vision impaired - to be further tested in functional context Additional Comments: appears to have right visual field cut Perception Perception: Impaired Inattention/Neglect: Does not attend to right visual field;Does not attend to right side of body Praxis Praxis: Impaired Praxis Impairment Details: Motor planning;Initiation  Cognition Overall Cognitive Status: Impaired at baseline Arousal/Alertness: Awake/alert Orientation Level: Oriented to person;Disoriented to place;Disoriented to time;Disoriented to situation Sensation Sensation Light Touch:  (difficult to assess due to dementia) Proprioception:  (difficult to assess due to dementia) Coordination Heel Shin Test: unable to assess due to dementia Motor  Motor Motor: Hemiplegia;Ataxia;Motor apraxia;Abnormal tone;Abnormal postural alignment and control  Mobility Bed Mobility Supine to Sit: 2: Max assist Transfers Sit to Stand: 1: +2 Total assist;With upper extremity assist;From bed Stand Pivot Transfers: 1: +3 Total assist, although pt did stand and pivot on LLE; he resisted movement with rigid RLE Locomotion  Ambulation Ambulation: No Gait Gait: No Stairs / Additional Locomotion Stairs: No Wheelchair Mobility Wheelchair Mobility: Yes Wheelchair Assistance: 1: +1 Total assist Wheelchair Propulsion: Left upper extremity;Left lower extremity Wheelchair Parts Management: Needs  assistance  Trunk/Postural Assessment  Cervical Assessment Cervical Assessment: Within Functional Limits Thoracic Assessment Thoracic Assessment: Within Functional Limits Lumbar Assessment Lumbar Assessment: Exceptions to [facility] (posterior pelvic tilt at all times in sitting) Postural Control Postural Control: Deficits on evaluation Protective Responses: delayed and inadequate Postural Limitations: leans L and  posteriorly in sitting; RLe rigid in sitting and supine  Balance Static Sitting Balance Static Sitting - Balance Support: Feet supported Static Sitting - Level of Assistance: 1: +1 Total assist Extremity Assessment  RLE Assessment RLE Assessment: Exceptions to [facility] RLE Strength RLE Overall Strength Comments: active motors at hip,knee and ankle; unable to MMT RLE Tone RLE Tone Comments: hypertonic hip and knee extension, PF; able intermittently to relax upon simple command LLE Assessment LLE Assessment: Within Functional Limits  FIM:  FIM - Bed/Chair Transfer Bed/Chair Transfer: 1: Two helpers FIM - Locomotion: Wheelchair Locomotion: Wheelchair: 1: Total Assistance/staff pushes wheelchair (Pt<25%) FIM - Locomotion: Ambulation Locomotion: Ambulation Assistive Devices: Parallel bars Ambulation/Gait Assistance: 1: +2 Total assist Locomotion: Ambulation: 1: Two helpers FIM - Locomotion: Stairs Locomotion: Building control surveyor: Radio broadcast assistant - 2 Locomotion: Stairs: 0: Activity did not occur   Refer to Care Plan for Long Term Goals  Recommendations for other services: None  Discharge Criteria: Patient will be discharged from PT if patient refuses treatment 3 consecutive times without medical reason, if treatment goals not met, if there is a change in medical status, if patient makes no progress towards goals or if patient is discharged from hospital.  The above assessment, treatment plan, treatment alternatives and goals were discussed and mutually agreed upon: No family  available/patient unable  Nell Schrack 05/31/2012, 2:46 PM

## 2012-05-31 NOTE — Care Management Note (Signed)
    Page 1 of 1   05/31/2012     8:48:29 AM   CARE MANAGEMENT NOTE 05/31/2012  Patient:  Raymond Rangel, Raymond Rangel   Account Number:  [id number]  Date Initiated:  05/30/2012  Documentation initiated by:  Jacquelynn Cree  Subjective/Objective Assessment:   admitted wit CVA     Action/Plan:   PT/Ot evals-recommending inpatient rehab   Anticipated DC Date:  05/30/2012   Anticipated DC Plan:  IP REHAB FACILITY  In-house referral  Clinical Social Worker      DC Planning Services  CM consult      Choice offered to / List presented to:             Status of service:  Completed, signed off Medicare Important Message given?   (If response is "NO", the following Medicare IM given date fields will be blank) Date Medicare IM given:   Date Additional Medicare IM given:    Discharge Disposition:  IP REHAB FACILITY  Per UR Regulation:  Reviewed for med. necessity/level of care/duration of stay  If discussed at Long Length of Stay Meetings, dates discussed:    Comments:

## 2012-05-31 NOTE — Evaluation (Signed)
Speech Language Pathology Assessment and Plan  Patient Details  Name: Raymond Rangel MRN: [id number] Date of Birth: [birth year]  SLP Diagnosis: Cognitive Impairments;Speech and Language deficits;Dysphagia  Rehab Potential: Fair ELOS: 2-3 weeks   Today's Date: 05/31/2012 Time: 1105-1150 Time Calculation (min): 45 min  Problem List:  Patient Active Problem List  Diagnosis  . Right shoulder pain  . DVT (deep venous thrombosis)  . CAD (coronary artery disease)  . Hyperlipidemia  . Dementia  . Diabetes mellitus, type 2  . CVA (cerebral infarction)  . Heat stroke  . Homonymous hemianopsia due to recent stroke   Past Medical History:  Past Medical History  Diagnosis Date  . Mini stroke   . Irregular heart beat   . Blood clot in vein     left leg dvt  . Dementia   . DVT (deep venous thrombosis)   . Hypercholesteremia   . Arthritis   . Diabetes mellitus    Past Surgical History:  Past Surgical History  Procedure Laterality Date  . Left arm    . Rotator cuff repair  2000(L) 1999 (R)    Assessment / Plan / Recommendation Clinical Impression  Raymond Rangel is a 79 y.o. right-handed male history of TIA, diabetes mellitus and dementia. 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 Admitted 05/26/2012 with right-sided weakness and slurred speech. MRI shows acute left PCA territory infarcts without hemorrhage. MRA of the head with 50% mid basilar stenosis. Proximal occlusion left PCA. Echocardiogram with ejection fraction 60% and grade 1 diastolic dysfunction. Carotid Dopplers with no ICA stenosis. Patient did not receive TPA. Neurology consulted patient maintained on aspirin 81 mg daily as prior to admission and Plavix added to regimen. Subcutaneous Lovenox for DVT prophylaxis. Currently patient is maintained on a dysphagia 1 thin liquid diet after modified barium swallow study 05/28/2012.Monitoring of cognition with noted history of dementia placed on Seroquel.  Physical and occupational therapy evaluations completed 05/27/2012 with recommendations of physical medicine rehabilitation consult to consider inpatient rehabilitation services. Patient was felt to be a good candidate for inpatient rehabilitation services and was admitted 05/30/12 for a comprehensive rehabilitation program. Cognitive-linguistic testing was limited due to fatigue and baseline is unknown; patient demonstrate severe receptive, expressive and cognitive deficits which impact his ability to express needs and wants or safely complete basic tasks such as self-feeding.  Additionally, patient presents with a moderate oral phase dysphagia which is compounded by his cognitive deficits. As a result, it is recommended that this patient receive skilled SLP services to maximize functional independence with basic, familiar tasks to reduce burden of care upon discharge.     SLP Assessment  Patient will need skilled Speech Lanaguage Pathology Services during CIR admission    Recommendations  Diet Recommendations: Dysphagia 1 (Puree);Thin liquid Liquid Administration via: Cup;No straw Medication Administration: Crushed with puree Supervision: Staff feed patient;Full supervision/cueing for compensatory strategies Compensations: Slow rate;Small sips/bites;Check for pocketing;Multiple dry swallows after each bite/sip;Clear throat intermittently Postural Changes and/or Swallow Maneuvers: Seated upright 90 degrees Oral Care Recommendations: Oral care before and after PO Patient destination:  (TBD) Follow up Recommendations: 24 hour supervision/assistance;Home Health SLP;Skilled Nursing facility Equipment Recommended: None recommended by SLP    SLP Frequency 5 out of 7 days   SLP Treatment/Interventions Cognitive remediation/compensation;Cueing hierarchy;Dysphagia/aspiration precaution training;Environmental controls;Functional tasks;Patient/family education;Speech/Language facilitation;Therapeutic  Activities;Therapeutic Exercise    Pain Pain Assessment Pain Assessment: No/denies pain PAINAD (Pain Assessment in Advanced Dementia) Breathing: normal Negative Vocalization: none Facial Expression: smiling or  inexpressive Body Language: relaxed Consolability: no need to console PAINAD Score: 0 Prior Functioning Cognitive/Linguistic Baseline: Information not available (baseline deficits) Baseline deficit details: short-term memory deficits due to dementia  Short Term Goals: Week 1: SLP Short Term Goal 1 (Week 1): Patient will follow 1 step directions during functional, familiar tasks with mod assist verbal and visual cues. SLP Short Term Goal 2 (Week 1): Patient will demonstrate basic problem solving during functional, familiar tasks with max assist verbal and visual cues. SLP Short Term Goal 3 (Week 1): Patient will sustain attention to basc task for 1 minute with max assist verba, visual and tactile cues. SLP Short Term Goal 4 (Week 1): Patient will consume Dys.1 textures and thin liquid with mod assist verbal, visual and tactile cues with no overt s/s of aspiration.   See FIM for current functional status Refer to Care Plan for Long Term Goals  Recommendations for other services: None  Discharge Criteria: Patient will be discharged from SLP if patient refuses treatment 3 consecutive times without medical reason, if treatment goals not met, if there is a change in medical status, if patient makes no progress towards goals or if patient is discharged from hospital.  The above assessment, treatment plan, treatment alternatives and goals were discussed and mutually agreed upon: No family available/patient unable  Charlane Ferretti., CCC-SLP [phone]  Angelisa Winthrop 05/31/2012, 4:52 PM

## 2012-05-31 NOTE — Progress Notes (Signed)
Social Work Assessment and Plan Social Work Assessment and Plan  Patient Details  Name: Raymond Rangel MRN: [id number] Date of Birth: 1932-01-02  Today's Date: 05/31/2012  Problem List:  Patient Active Problem List  Diagnosis  . Right shoulder pain  . DVT (deep venous thrombosis)  . CAD (coronary artery disease)  . Hyperlipidemia  . Dementia  . Diabetes mellitus, type 2  . CVA (cerebral infarction)  . Heat stroke  . Homonymous hemianopsia due to recent stroke   Past Medical History:  Past Medical History  Diagnosis Date  . Mini stroke   . Irregular heart beat   . Blood clot in vein     left leg dvt  . Dementia   . DVT (deep venous thrombosis)   . Hypercholesteremia   . Arthritis   . Diabetes mellitus    Past Surgical History:  Past Surgical History  Procedure Laterality Date  . Left arm    . Rotator cuff repair  2000(L) 1999 (R)   Social History:  reports that he quit smoking about 54 years ago. He has never used smokeless tobacco. He reports that he does not drink alcohol or use illicit drugs.  Family / Support Systems Marital Status: Married Patient Roles: Spouse;Parent Spouse/Significant Other: [name]-wife  [phone]-home Children: [name]-daughter  [phone] Other Supports: Two other daughter's and a son-who has been on rehab in 2010 Anticipated Caregiver: Argie Ramming is planning on taking a FMLA and other children will try to assist-wife recovering from [facility] in 03/2012 Ability/Limitations of Caregiver: Daughter to take a FMLA to assist with pt's care.  His wife is recovering from [facility] in Dec.  His one dauhgter is seven months pregnant and son and other daughter have health issues. Caregiver Availability: 24/7 Family Dynamics: Very close knit family who are there for one another and always have been.  Daughter is hopeful he will do well here and have high praises of rehab from brother being here.  Social History Preferred language: English Religion:  Baptist Cultural Background: No issues Education: [employer] Read: Yes Write: Yes Employment Status: Retired Fish farm manager Issues: No issues Guardian/Conservator: None-according to MD pt is not capable of mkaing his own decisions-wil look toward his wife to [place] these decisions.   Abuse/Neglect Physical Abuse: Denies Verbal Abuse: Denies Sexual Abuse: Denies Exploitation of patient/patient's resources: Denies Self-Neglect: Denies  Emotional Status Pt's affect, behavior adn adjustment status: Pt is quiet and not able to answer questions asked of him.  Daughter reports he is more vocal with them.  He has always been able to care for himself and really enjoys adult day health.  daughter is hopeful he will make progress here. Recent Psychosocial Issues: Other medical issues-dementia, diabetes, etc Pyschiatric History: No history-not able to do depression screen due to cognition issues from dementia and stroke.  Will work with family and monitor pt regarding his coping. Substance Abuse History: No issues  Patient / Family Perceptions, Expectations & Goals Pt/Family understanding of illness & functional limitations: Daughter is able to explain his stroke and deficits.  She seems to have a good understanding and has spoken with MD's regarding his condition. Premorbid pt/family roles/activities: Husband, father, retiree, grandfather, Church member, Adult Public house manager, etc Anticipated changes in roles/activities/participation: resume Pt/family expectations/goals: Daughter states: " We are hopeful he will progress, but we are prepared to assist him."  Pt reports: " I want to do good here."  [employer]: Other (Comment) ([facility]-Adult day Care) Premorbid Home Care/DME Agencies:  None Transportation available at discharge: Family Resource referrals recommended: Support group (specify) (CVA Support group & Caregiver's group)  Discharge  Planning Living Arrangements: Spouse/significant other;Children Support Systems: Spouse/significant other;Children;Other relatives;Friends/neighbors;Church/faith community Type of Residence: Private residence Insurance Resources: Media planner (specify) Investment banker, operational) Financial Resources: Restaurant manager, fast food Screen Referred: No Living Expenses: Lives with family Money Management: Spouse Do you have any problems obtaining your medications?: No Home Management: Wife and children now she is recovering from hip surgery. Patient/Family Preliminary Plans: Plan at this point is to return home with assistacne form daughter who is taking a FMLA and other children as they can-but are limited by health issues.  Pt may be more care than daughter can handle if goals are mod-min level.  Continue to work on a safe discharge plan. Social Work Anticipated Follow Up Needs: HH/OP;Support Group  Clinical Impression Pleasant gentleman who is sweet but unsure how well he will do with stroke deficits and dementia.  Daughter is willing to assist but is exhausted already with Mom at home recovering from hip replacement and Sister's health issues and brother's health, he was a patient here in 2010.  Will see how pt progresses and see if workable discharge plan home.  Lucy Chris 05/31/2012, 2:21 PM

## 2012-05-31 NOTE — Progress Notes (Signed)
INITIAL NUTRITION ASSESSMENT  Pt meets criteria for NON-SEVERE MALNUTRITION in the context of chronic illness as evidenced by mild-moderate fat and muscle wasting.  DOCUMENTATION CODES Per approved criteria  -Non-severe malnutrition in the context of chronic illness   INTERVENTION: Ensure Complete po BID, each supplement provides 350 kcal and 13 grams of protein.  NUTRITION DIAGNOSIS: Inadequate oral intake related to decreased appetite as evidenced by meal completion 50%.   Goal: Pt to meet >/= 90% of their estimated nutrition needs.   Monitor:  PO intake, supplement acceptance, weight  Reason for Assessment: Low Braden  78 y.o. male  Admitting Dx: Heat stroke  ASSESSMENT: Pt admitted with CVA. Transferred to inpatient rehab yesterday afternoon. RD evaluated pt yesterday while pt was in acute hospital; RD spoke with pt, sitter, and family. Per pt he feels that he has weight loss but is unable to specify how much or in what time frame. Pt states that he was weighing in the 170's, but did not know what time frame.   Height: Ht Readings from Last 1 Encounters:  05/27/12 5\' 6"  (1.676 m)    Weight: Wt Readings from Last 1 Encounters:  05/27/12 136 lb 3.2 oz (61.78 kg)    Ideal Body Weight: 64.5 kg  % Ideal Body Weight: 96%  Wt Readings from Last 10 Encounters:  05/27/12 136 lb 3.2 oz (61.78 kg)  07/29/11 136 lb (61.689 kg)  06/29/11 137 lb (62.143 kg)  06/21/11 126 lb 9.6 oz (57.425 kg)  06/16/11 127 lb (57.607 kg)  06/13/11 133 lb (60.328 kg)  06/10/11 133 lb (60.328 kg)  06/08/11 137 lb (62.143 kg)  05/03/11 139 lb 1.8 oz (63.1 kg)  04/21/11 135 lb (61.236 kg)    Usual Body Weight: unsure  % Usual Body Weight: -  BMI:  22.1 - WNL   Estimated Nutritional Needs: Kcal: 1600-1800 Protein: 75-85 grams Fluid: > 1.6 L/day  Skin: no issues  Nutrition Focused Physical Exam; completed by RD on 2/19  Subcutaneous Fat:  Orbital Region: WNL Upper Arm Region:  mild wasting Thoracic and Lumbar Region: WNL  Muscle:  Temple Region: mild wasting Clavicle Bone Region: severe wasting Clavicle and Acromion Bone Region: severe wasting Scapular Bone Region: NA Dorsal Hand: WNL Patellar Region: WNL Anterior Thigh Region: mild wasting Posterior Calf Region: mild wasting  Edema: not present   Diet Order: Dysphagia 1 with Thin Liquids Meal Completion: 50%  EDUCATION NEEDS: -No education needs identified at this time   Intake/Output Summary (Last 24 hours) at 05/31/12 0938 Last data filed at 05/31/12 0900  Gross per 24 hour  Intake    240 ml  Output      0 ml  Net    240 ml    Last BM: 2/19  Labs:   Recent Labs Lab 05/28/12 0425 05/29/12 0545 05/30/12 1750 05/31/12 0635  NA 143 142  --  139  K 3.4* 3.6  --  3.6  CL 109 110  --  102  CO2 21 19  --  24  BUN 8 7  --  8  CREATININE 0.84 0.83 0.75 0.91  CALCIUM 8.9 8.7  --  9.1  GLUCOSE 93 82  --  102*    CBG (last 3)   Recent Labs  05/30/12 1635 05/30/12 2049 05/31/12 0721  GLUCAP 97 121* 97    Scheduled Meds: . aspirin  81 mg Oral Daily  . clopidogrel  75 mg Oral Q breakfast  . enoxaparin (LOVENOX) injection  30 mg Subcutaneous Q12H  . QUEtiapine  25 mg Oral BID  . simvastatin  40 mg Oral QHS    Continuous Infusions:   Past Medical History  Diagnosis Date  . Mini stroke   . Irregular heart beat   . Blood clot in vein     left leg dvt  . Dementia   . DVT (deep venous thrombosis)   . Hypercholesteremia   . Arthritis   . Diabetes mellitus     Past Surgical History  Procedure Laterality Date  . Left arm    . Rotator cuff repair  2000(L) 1999 (R)   Jarold Motto MS, RD, LDN Pager: [phone] After-hours pager: [phone]

## 2012-05-31 NOTE — Progress Notes (Signed)
Patient ID: Raymond Rangel, male   DOB: Apr 28, 1931, 79 y.o.   MRN: [id number]  Subjective/Complaints: 79 y.o. right-handed male history of TIA, diabetes mellitus and dementia. 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 Admitted 05/26/2012 with right-sided weakness and slurred speech. MRI shows acute left PCA territory infarcts without hemorrhage. MRA of the head with 50% mid basilar stenosis. Proximal occlusion left PCA. Echocardiogram with ejection fraction 60% and grade 1 diastolic dysfunction. Carotid Dopplers with no ICA stenosis. Patient did not receive TPA. Neurology consulted patient maintained on aspirin 81 mg daily as prior to admission and Plavix added to regimen. Subcutaneous Lovenox for DVT prophylaxis. Currently patient is maintained on a dysphagia 1 thin liquid diet after modified barium swallow study 05/28/2012.Monitoring of cognition with noted history of dementia placed on Seroquel.  Review of Systems  Unable to perform ROS: mental acuity   Objective: Vital Signs: Blood pressure 134/87, pulse 87, temperature 97.2 F (36.2 C), temperature source Oral, resp. rate 18, SpO2 96.00%. Dg Swallowing Func-speech Pathology  05/29/2012  Lacinda Axon, CCC-SLP     05/29/2012 11:55 AM Objective Swallowing Evaluation: Modified Barium Swallowing Study   Patient Details  Name: Raymond Rangel MRN: [id number] Date of Birth: 10-25-1931  Today's Date: 05/29/2012 Time: 1000-1030 SLP Time Calculation (min): 30 min  Past Medical History:  Past Medical History  Diagnosis Date  . Mini stroke   . Irregular heart beat   . Blood clot in vein     left leg dvt  . Dementia   . DVT (deep venous thrombosis)   . Hypercholesteremia   . Arthritis   . Diabetes mellitus    Past Surgical History:  Past Surgical History  Procedure Laterality Date  . Left arm    . Rotator cuff repair  2000(L) 1999 (R)   HPI:  79 yo male h/o tia, dementia comes in with rt sided weakness for  less than 24 hours.  dtr is present.   He lives with his wife.  He  was normal yesterday (his baseline is walking with cane, able to  feed himself, normally can speak clearly ) but today he is more  confused than normal, cannot answer questions appropriately and  rt side of body weak.  No sz activity.  No fevers/n/v/d.  No  rashes.  He is confused but denies any pain.  History obtained  from dtr. MRI reveals Acute left PCA territory infarct affects  the thalamus, medial temporal lobe, occipital lobe, potentially  affecting the left internal capsule.  MBS  Indicated to assess  risk for aspiration and recommend safest, possible PO diet.       Assessment / Plan / Recommendation Clinical Impression  Dysphagia Diagnosis: Moderate oral phase dysphagia;Moderate  pharyngeal phase dysphagia Moderate sensory motor oral dysphagia marked by the following:  poor oral awareness, ineffective vertical mastication, decreased  lingual strength and coordination resulting in poor bolus  cohesion and transit.  Majority of mechanical soft bolus wedged  on alveolar ridge requiring max verbal and tactile cues to remove  bolus with tongue sweep.  Whole barium tablet administered in  puree with tablet remaining on anterior portion of tongue.   Patient unable to transit pill and eventually expectorated pill.   Moderate sensory motor pharyngeal phase dysphagia.  Trace  penetration during swallow of nectar by cup and straw  consistently mainly due to residuals in pyriforms.  Initiation of  swallow with thin by cup and straw at pyriforms with trace  silent  aspiration x1 out of multiple trials.  Prominent, to what  appeared to be, osteophyte at C-4 slowing bolus transit and  increasing residuals at level of pyriforms and CP segment.  Residuals at pyriforms cleared with cued second swallow.  Brief  esophageal screen indicates functional bolus transit.  Recommend  to proceed with dysphagia 1 (puree) and thin liquids with full  assist with each meals to ensure safety.  Recommend puree  mainly  due to severity of oral dysphagia.  Please note patient required  hand over hand assist for self feeding with majority of PO trials  administered by treating SLP.  Only trace silent aspiration  occurred with nectar thick liquids and x1 with thin liquid but  patient was in optimal upright positioning.  Defer to upgrade  solids bedside to treating SLP with clinical improvement.  ST to  follow in acute care setting for diet tolerance and possible  advancement.  Diagnostic treatment completed after evaluation  focusing on providing education to caregivers on recommended  swallow strategies specific for patient to decrease risk of  aspiration.       Treatment Recommendation       Diet Recommendation Dysphagia 1 (Puree);Thin liquid   Liquid Administration via: Cup;Straw Medication Administration: Crushed with puree Supervision: Full supervision/cueing for compensatory  strategies;Staff feed patient Compensations: Slow rate;Small sips/bites;Check for  pocketing;Multiple dry swallows after each bite/sip;Clear throat  intermittently Postural Changes and/or Swallow Maneuvers: Out of bed for  meals;Seated upright 90 degrees;Upright 30-60 min after meal    Other  Recommendations Oral Care Recommendations: Oral care  before and after PO Other Recommendations: Clarify dietary restrictions   Follow Up Recommendations  Inpatient Rehab    Frequency and Duration min 3x week  2 weeks       SLP Swallow Goals Patient will consume recommended diet without observed clinical  signs of aspiration with: Maximum assistance Patient will utilize recommended strategies during swallow to  increase swallowing safety with: Maximum assistance Swallow Study Goal #3 - Progress: Discontinued (comment)  (Following results of MBS )   General Date of Onset: 05/26/12 HPI: 79 yo male h/o tia, dementia comes in with rt sided weakness  for less than 24 hours.  dtr is present.  He lives with his wife.   He was normal yesterday (his baseline is walking  with cane, able  to feed himself, normally can speak clearly ) but today he is  more confused than normal, cannot answer questions appropriately  and rt side of body weak.  No sz activity.  No fevers/n/v/d.  No  rashes.  He is confused but denies any pain.  History obtained  from dtr. MRI reveals Acute left PCA territory infarct affects  the thalamus, medial temporal lobe, occipital lobe, potentially  affecting the left internal capsule.  Type of Study: Modified Barium Swallowing Study Reason for Referral: Objectively evaluate swallowing function Previous Swallow Assessment: BSE 05/26/12 Diet Prior to this Study: NPO Temperature Spikes Noted: No Respiratory Status: Supplemental O2 delivered via (comment)  (nasal cannula  2Liter ) History of Recent Intubation: No Behavior/Cognition: Alert;Cooperative;Pleasant mood;Requires  cueing;Confused Oral Cavity - Dentition: Dentures, top Oral Motor / Sensory Function: Impaired - see Bedside swallow  eval Self-Feeding Abilities: Total assist Patient Positioning: Upright in chair Baseline Vocal Quality: Clear Volitional Cough: Strong Volitional Swallow: Unable to elicit Anatomy: Within functional limits Pharyngeal Secretions: Not observed secondary MBS    Reason for Referral Objectively evaluate swallowing function   Oral Phase Oral Preparation/Oral  Phase Oral Phase: Impaired Oral - Nectar Oral - Nectar Teaspoon: Lingual pumping;Incomplete tongue to  palate contact;Reduced posterior propulsion;Delayed oral  transit;Decreased velopharyngeal closure;Piecemeal  swallowing;Lingual/palatal residue;Weak lingual manipulation Oral - Nectar Cup: Lingual pumping;Incomplete tongue to palate  contact;Reduced posterior propulsion;Lingual/palatal  residue;Piecemeal swallowing;Decreased velopharyngeal  closure;Delayed oral transit;Weak lingual manipulation Oral - Nectar Straw: Lingual pumping;Incomplete tongue to palate  contact;Reduced posterior propulsion;Lingual/palatal  residue;Piecemeal  swallowing;Decreased velopharyngeal  closure;Holding of bolus;Delayed oral transit;Weak lingual  manipulation Oral - Thin Oral - Thin Teaspoon: Weak lingual manipulation;Lingual  pumping;Incomplete tongue to palate contact;Reduced posterior  propulsion;Holding of bolus;Delayed oral transit;Decreased  velopharyngeal closure;Piecemeal swallowing;Lingual/palatal  residue Oral - Thin Cup: Lingual pumping;Incomplete tongue to palate  contact;Reduced posterior propulsion;Weak lingual  manipulation;Holding of bolus;Delayed oral transit;Decreased  velopharyngeal closure;Piecemeal swallowing;Lingual/palatal  residue Oral - Thin Straw: Lingual pumping;Weak lingual  manipulation;Incomplete tongue to palate contact;Reduced  posterior propulsion;Holding of bolus;Decreased velopharyngeal  closure;Piecemeal swallowing;Lingual/palatal residue;Delayed oral  transit Oral - Solids Oral - Puree: Lingual pumping;Incomplete tongue to palate  contact;Weak lingual manipulation;Delayed oral transit;Decreased  velopharyngeal closure;Piecemeal swallowing;Lingual/palatal  residue;Reduced posterior propulsion Oral - Mechanical Soft: Impaired mastication;Weak lingual  manipulation;Incomplete tongue to palate contact;Lingual  pumping;Lingual/palatal residue;Piecemeal swallowing;Decreased  velopharyngeal closure;Holding of bolus;Reduced posterior  propulsion;Delayed oral transit Oral - Pill: Lingual pumping;Weak lingual manipulation;Holding of  bolus   Pharyngeal Phase Pharyngeal Phase Pharyngeal Phase: Impaired Pharyngeal - Nectar Pharyngeal - Nectar Teaspoon: Premature spillage to  valleculae;Reduced pharyngeal peristalsis;Reduced anterior  laryngeal mobility;Reduced tongue base retraction;Reduced  laryngeal elevation;Pharyngeal residue - pyriform  sinuses;Pharyngeal residue - cp segment Pharyngeal - Nectar Cup: Premature spillage to valleculae;Reduced  pharyngeal peristalsis;Reduced laryngeal elevation;Reduced  airway/laryngeal closure;Reduced  tongue base  retraction;Penetration/Aspiration during swallow;Trace  aspiration;Pharyngeal residue - pyriform sinuses Penetration/Aspiration details (nectar cup): Material enters  airway, CONTACTS cords and not ejected out Pharyngeal - Nectar Straw: Premature spillage to pyriform  sinuses;Reduced laryngeal elevation;Reduced airway/laryngeal  closure;Reduced tongue base retraction;Reduced pharyngeal  peristalsis;Reduced anterior laryngeal  mobility;Penetration/Aspiration during swallow;Trace  aspiration;Pharyngeal residue - posterior pharnyx;Pharyngeal  residue - valleculae;Pharyngeal residue - pyriform sinuses Penetration/Aspiration details (nectar straw): Material enters  airway, CONTACTS cords and not ejected out Pharyngeal - Thin Pharyngeal - Thin Teaspoon: Premature spillage to  valleculae;Reduced pharyngeal peristalsis;Reduced epiglottic  inversion;Reduced anterior laryngeal mobility;Reduced laryngeal  elevation;Reduced airway/laryngeal closure;Reduced tongue base  retraction;Pharyngeal residue - pyriform sinuses;Pharyngeal  residue - valleculae Pharyngeal - Thin Cup: Reduced laryngeal elevation;Reduced  pharyngeal peristalsis;Premature spillage to pyriform  sinuses;Reduced anterior laryngeal  mobility;Penetration/Aspiration during swallow;Reduced  airway/laryngeal closure;Reduced tongue base  retraction;Pharyngeal residue - pyriform sinuses Penetration/Aspiration details (thin cup): Material enters  airway, remains ABOVE vocal cords and not ejected out Pharyngeal - Thin Straw: Premature spillage to pyriform  sinuses;Reduced pharyngeal peristalsis;Reduced anterior laryngeal  mobility;Reduced laryngeal elevation;Reduced tongue base  retraction Pharyngeal - Solids Pharyngeal - Puree: Premature spillage to valleculae;Reduced  pharyngeal peristalsis;Reduced laryngeal elevation;Reduced  airway/laryngeal closure;Reduced tongue base  retraction;Pharyngeal residue - valleculae;Pharyngeal residue -  pyriform  sinuses;Pharyngeal residue - posterior pharnyx Pharyngeal - Mechanical Soft: Premature spillage to  valleculae;Reduced pharyngeal peristalsis;Reduced laryngeal  elevation;Reduced airway/laryngeal closure;Reduced tongue base  retraction;Pharyngeal residue - pyriform sinuses  Cervical Esophageal Phase    GO    Cervical Esophageal Phase Cervical Esophageal Phase: Impaired Cervical Esophageal Phase - Nectar Nectar Cup: Reduced cricopharyngeal relaxation Cervical Esophageal Phase - Solids Puree: Reduced cricopharyngeal relaxation Mechanical Soft: Reduced cricopharyngeal relaxation        Moreen Fowler M.S., CCC-SLP [phone] [facility] 05/29/2012, 11:07 AM     Results for orders placed during the hospital encounter of  05/30/12 (from the past 72 hour(s))  CBC     Status: Abnormal   Collection Time    05/30/12  5:50 PM      Result Value Range   WBC 8.2  4.0 - 10.5 K/uL   RBC 4.53  4.22 - 5.81 MIL/uL   Hemoglobin 13.5  13.0 - 17.0 g/dL   HCT 16.1 (*) 09.6 - 04.5 %   MCV 85.2  78.0 - 100.0 fL   MCH 29.8  26.0 - 34.0 pg   MCHC 35.0  30.0 - 36.0 g/dL   RDW 40.9  81.1 - 91.4 %   Platelets 152  150 - 400 K/uL  CREATININE, SERUM     Status: Abnormal   Collection Time    05/30/12  5:50 PM      Result Value Range   Creatinine, Ser 0.75  0.50 - 1.35 mg/dL   GFR calc non Af Amer 86 (*) >90 mL/min   GFR calc Af Amer >90  >90 mL/min   Comment:            The eGFR has been calculated     using the CKD EPI equation.     This calculation has not been     validated in all clinical     situations.     eGFR's persistently     <90 mL/min signify     possible Chronic Kidney Disease.  GLUCOSE, CAPILLARY     Status: Abnormal   Collection Time    05/30/12  8:49 PM      Result Value Range   Glucose-Capillary 121 (*) 70 - 99 mg/dL   Comment 1 Notify RN    CBC WITH DIFFERENTIAL     Status: Abnormal   Collection Time    05/31/12  6:35 AM      Result Value Range   WBC 12.2 (*) 4.0 - 10.5 K/uL   RBC 4.95  4.22  - 5.81 MIL/uL   Hemoglobin 14.2  13.0 - 17.0 g/dL   HCT 78.2  95.6 - 21.3 %   MCV 85.7  78.0 - 100.0 fL   MCH 28.7  26.0 - 34.0 pg   MCHC 33.5  30.0 - 36.0 g/dL   RDW 08.6  57.8 - 46.9 %   Platelets 180  150 - 400 K/uL   Neutrophils Relative 71  43 - 77 %   Neutro Abs 8.7 (*) 1.7 - 7.7 K/uL   Lymphocytes Relative 17  12 - 46 %   Lymphs Abs 2.0  0.7 - 4.0 K/uL   Monocytes Relative 7  3 - 12 %   Monocytes Absolute 0.9  0.1 - 1.0 K/uL   Eosinophils Relative 4  0 - 5 %   Eosinophils Absolute 0.5  0.0 - 0.7 K/uL   Basophils Relative 1  0 - 1 %   Basophils Absolute 0.1  0.0 - 0.1 K/uL  COMPREHENSIVE METABOLIC PANEL     Status: Abnormal   Collection Time    05/31/12  6:35 AM      Result Value Range   Sodium 139  135 - 145 mEq/L   Potassium 3.6  3.5 - 5.1 mEq/L   Chloride 102  96 - 112 mEq/L   CO2 24  19 - 32 mEq/L   Glucose, Bld 102 (*) 70 - 99 mg/dL   BUN 8  6 - 23 mg/dL   Creatinine, Ser 6.29  0.50 - 1.35 mg/dL   Calcium 9.1  8.4 -  10.5 mg/dL   Total Protein 7.5  6.0 - 8.3 g/dL   Albumin 3.4 (*) 3.5 - 5.2 g/dL   AST 23  0 - 37 U/L   ALT 15  0 - 53 U/L   Alkaline Phosphatase 107  39 - 117 U/L   Total Bilirubin 0.5  0.3 - 1.2 mg/dL   GFR calc non Af Amer 79 (*) >90 mL/min   GFR calc Af Amer >90  >90 mL/min   Comment:            The eGFR has been calculated     using the CKD EPI equation.     This calculation has not been     validated in all clinical     situations.     eGFR's persistently     <90 mL/min signify     possible Chronic Kidney Disease.  GLUCOSE, CAPILLARY     Status: None   Collection Time    05/31/12  7:21 AM      Result Value Range   Glucose-Capillary 97  70 - 99 mg/dL   Comment 1 Notify RN       HEENT: normal Cardio: RRR Resp: CTA B/L GI: BS positive and Distention Extremity:  Pulses positive and No Edema Skin:   Intact Neuro: Alert/Oriented, Confused, Flat, Cranial Nerve II-XII normal, Abnormal Sensory Reduced sensation on Right but also has poor  attention to testing, Abnormal Motor 0/5 in RUE and RLE, 4/5 LUE, 3-/5 LLE and Tone:  increased flexor tone RUE, increased extensor tone RLE Musc/Skel:  Other Old scar L antecubital fossa, non tender Gen:  NAD Cognitive: oriented to person and "room" but not hospital or CVA or date Righ homonymous hemianopsia  Assessment/Plan: 1. Functional deficits secondary to Left PCA infarct which require 3+ hours per day of interdisciplinary therapy in a comprehensive inpatient rehab setting. Physiatrist is providing close team supervision and 24 hour management of active medical problems listed below. Physiatrist and rehab team continue to assess barriers to discharge/monitor patient progress toward functional and medical goals. FIM:                   Comprehension Comprehension Mode: Auditory Comprehension: 1-Understands basic less than 25% of the time/requires cueing 75% of the time  Expression Expression Mode: Verbal Expression: 1-Expresses basis less than 25% of the time/requires cueing greater than 75% of the time.  Social Interaction Social Interaction: 1-Interacts appropriately less than 25% of the time. May be withdrawn or combative.  Problem Solving Problem Solving: 1-Solves basic less than 25% of the time - needs direction nearly all the time or does not effectively solve problems and may need a restraint for safety  Memory Memory: 1-Recognizes or recalls less than 25% of the time/requires cueing greater than 75% of the time Medical Problem List and Plan:  1. embolic left PCA infarct  2. DVT Prophylaxis/Anticoagulation: Subcutaneous Lovenox. Monitor platelet counts and any signs of bleeding Hx of Left femoral L popliteal,Left tibial subacute DVT diagnosed January 2013.Will increase Lovenox to 30mg  BID  3. Mood/dementia. Seroquel 25 mg twice a day, Ativan when necessary. Patient displayed baseline cognitive deficits per family.  4. Neuropsych: This patient is not capable of  making decisions on his/her own behalf.  5. Hyperlipidemia. Zocor  6. Diabetes mellitus with peripheral neuropathy. Hemoglobin A1c 6.7. Patient on diet control prior to admission. Check CBGs a.c. and at bedtime  7. Dysphagia. Dysphagia 1 thin liquids. Speech therapy followup. Monitor for any signs  of aspiration.       LOS (Days) 1 A FACE TO FACE EVALUATION WAS PERFORMED  Anahli Arvanitis E 05/31/2012, 7:54 AM

## 2012-05-31 NOTE — Care Management Note (Signed)
[facility] Individual Statement of Services  Patient Name:  Raymond Rangel  Date:  05/31/2012  Welcome to the [facility]patient [facility].  Our goal is to provide you with an individualized program based on your diagnosis and situation, designed to meet your specific needs.  With this comprehensive rehabilitation program, you will be expected to participate in at least 3 hours of rehabilitation therapies Monday-Friday, with modified therapy programming on the weekends.  Your rehabilitation program will include the following services:  Physical Therapy (PT), Occupational Therapy (OT), Speech Therapy (ST), 24 hour per day rehabilitation nursing, Therapeutic Recreaction (TR), Case Management (  Social Worker), Rehabilitation Medicine, Nutrition Services and Pharmacy Services  Weekly team conferences will be held on Wednesday to discuss your progress.  Your  Social Worker will talk with you frequently to get your input and to update you on team discussions.  Team conferences with you and your family in attendance may also be held.  Expected length of stay: 2-3 weeks Overall anticipated outcome: min/mod assist  Depending on your progress and recovery, your program may change.  Your Social Worker will coordinate services and will keep you informed of any changes.  Your  SW name and contact number are listed  below.  The following services may also be recommended but are not provided by the Inpatient [facility]:  Home Health Rehabiltiation Services  Outpatient Rehabilitatation Servives   Arrangements will be made to provide these services after discharge if needed.  Arrangements include referral to agencies that provide these services.  Your insurance has been verified to be:  [employer]-Medicare Your primary doctor is:  Dr [name]  Pertinent information will be shared with your doctor and your insurance company.   Social Worker:  Dossie Der, [place]  [phone]  Information discussed with and copy given to patient by: Lucy Chris, 05/31/2012, 9:18 AM

## 2012-06-01 ENCOUNTER — Inpatient Hospital Stay (HOSPITAL_COMMUNITY): Payer: Medicare Other | Admitting: *Deleted

## 2012-06-01 ENCOUNTER — Inpatient Hospital Stay (HOSPITAL_COMMUNITY): Payer: Medicare Other

## 2012-06-01 ENCOUNTER — Inpatient Hospital Stay (HOSPITAL_COMMUNITY): Payer: Medicare Other | Admitting: Occupational Therapy

## 2012-06-01 ENCOUNTER — Inpatient Hospital Stay (HOSPITAL_COMMUNITY): Payer: Medicare Other | Admitting: Speech Pathology

## 2012-06-01 DIAGNOSIS — I6330 Cerebral infarction due to thrombosis of unspecified cerebral artery: Secondary | ICD-10-CM

## 2012-06-01 LAB — GLUCOSE, CAPILLARY
Glucose-Capillary: 114 mg/dL — ABNORMAL HIGH (ref 70–99)
Glucose-Capillary: 100 mg/dL — ABNORMAL HIGH (ref 70–99)

## 2012-06-01 LAB — URINE MICROSCOPIC-ADD ON

## 2012-06-01 LAB — URINALYSIS, ROUTINE W REFLEX MICROSCOPIC
Glucose, UA: NEGATIVE mg/dL
Ketones, ur: NEGATIVE mg/dL
Protein, ur: 100 mg/dL — AB
Urobilinogen, UA: 0.2 mg/dL (ref 0.0–1.0)

## 2012-06-01 NOTE — Plan of Care (Signed)
Problem: RH BLADDER ELIMINATION Goal: RH STG MANAGE BLADDER WITH ASSISTANCE STG Manage Bladder With max assist timed toileting  Outcome: Not Progressing Patient uses condom cath at night and diaper.

## 2012-06-01 NOTE — Progress Notes (Signed)
Physical Therapy Session Note  Patient Details  Name: Raymond Rangel MRN: [id number] Date of Birth: 28-Jul-1931  Today's Date: 06/01/2012 Time: 1130-1200 Time Calculation (min): 30 min   Skilled Therapeutic Interventions/Progress Updates:    Patient received sitting in wheelchair with increased posterior pelvic tilt. Today's session focused on bed mobility on flat surface and wheelchair management. Prior to transferring wheelchair>mat, patient exhibits increased extensor tone, greater on the R LE, and pushes backwards when extending, tipping wheelchair. Noted no anti-tippers on wheelchair. Patient transfers wheelchair>mat, exhibiting extensor tone/posterior lean, again, greater on the R LE. While in supine, with supervision from other staff, switched wheelchair to 16x16 (with anti-tippers) secondary to increased space on both sides of current wheelchair. Switched cushion to Jay-2 for pressure relief and for increased support to improve upright posture and neutral pelvic alignment (facilitate increased anterior pelvic tilt) .  Patient benefits from simple cues and phrases that initiate automatic tasks. When instructed to "scoot back" or "lay down" or "sit up", patient initiates and demonstrates good motor planning, but requires assistance secondary to tone and weakness.  Patient returned to room and left seated in wheelchair with seatbelt donned and all needs within reach.  Therapy Documentation Precautions:  Precautions Precautions: Fall Precaution Comments: increased tone right side (extensor in leg, flexor in arm) Restrictions Weight Bearing Restrictions: No Pain: Pain Assessment Pain Assessment: No/denies pain Pain Score: 0-No pain Locomotion : Ambulation Ambulation/Gait Assistance: Not tested (comment) Wheelchair Mobility Distance: 150   See FIM for current functional status  Therapy/Group: Individual Therapy  Raymond Rangel. Sonjia Wilcoxson, PT, DPT  06/01/2012, 12:19 PM

## 2012-06-01 NOTE — Progress Notes (Signed)
Physical Therapy Note  Patient Details  Name: Isamar Wellbrock MRN: [id number] Date of Birth: 31-Aug-1931 Today's Date: 06/01/2012  1:00 - 1:45 45 minutes individual session Patient denies pain.  Treatment focused on ambulation. Patient sit to stand with max assist and ambulated with railing in hallway 30 feet x 1. Patient requires facilitation for shifting weight and assist to progress right LE forward. Used shoe cover to make foot slide easier as patient tends to keep right LE extended. Patient uses step to pattern and needs cueing to take longer step with left foot. Patient ambulated with rolling walker 30 feet with same level of assistance. Patient ambulated 110 feet with +2 hand held assist, facilitation for shifting weight and assist to progress right LE as described above. Patient stood at table and performed table top activity in standing x 3 minutes with mod assist to maintain standing. Patient returned to room with all items in reach.    Arelia Longest M 06/01/2012, 2:41 PM

## 2012-06-01 NOTE — Progress Notes (Signed)
Speech Language Pathology Daily Session Note  Patient Details  Name: Raymond Rangel MRN: [id number] Date of Birth: [birth year]  Today's Date: 06/01/2012 Time: 0830-0900 Time Calculation (min): 30 min  Short Term Goals: Week 1: SLP Short Term Goal 1 (Week 1): Patient will follow 1 step directions during functional, familiar tasks with mod assist verbal and visual cues. SLP Short Term Goal 2 (Week 1): Patient will demonstrate basic problem solving during functional, familiar tasks with max assist verbal and visual cues. SLP Short Term Goal 3 (Week 1): Patient will sustain attention to basc task for 1 minute with max assist verba, visual and tactile cues. SLP Short Term Goal 4 (Week 1): Patient will consume Dys.1 textures and thin liquid with mod assist verbal, visual and tactile cues with no overt s/s of aspiration.   Skilled Therapeutic Interventions: Skilled treatment session focused on addressing dysphagia and self-feeding goals.  Patient sitting up in chair and SLP facilitated session by setting up tray of Dys.1 textures and thin liquids.  Patient consumed meal with mod assist verbal and tactile cues to initiate bites, clear oral residue and to utilize thin liquid wash to assist with reducing oral residue.  Patient demonstrated strong cough response after 2 bites of pureed eggs, which patient appeared to clear with cues for strong coughs.  No overt s/s during consumption of thin liquids.  Recommend to continue with current plan of care and strict full supervision.    FIM:  Comprehension Comprehension Mode: Auditory Comprehension: 2-Understands basic 25 - 49% of the time/requires cueing 51 - 75% of the time Expression Expression Mode: Verbal Expression: 3-Expresses basic 50 - 74% of the time/requires cueing 25 - 50% of the time. Needs to repeat parts of sentences. Social Interaction Social Interaction: 4-Interacts appropriately 75 - 89% of the time - Needs redirection for appropriate language  or to initiate interaction. Problem Solving Problem Solving: 1-Solves basic less than 25% of the time - needs direction nearly all the time or does not effectively solve problems and may need a restraint for safety Memory Memory: 1-Recognizes or recalls less than 25% of the time/requires cueing greater than 75% of the time FIM - Eating Eating Activity: 4: Helper occasionally scoops food on utensil;4: Helper checks for pocketed food;4: Help with picking up utensils;4: Help with managing cup/glass  Pain Pain Assessment Pain Assessment: No/denies pain Pain Score: 0-No pain  Therapy/Group: Individual Therapy  Charlane Ferretti., CCC-SLP [phone]  Raymond Rangel 06/01/2012, 4:04 PM

## 2012-06-01 NOTE — Progress Notes (Signed)
Patient ID: Raymond Rangel, male   DOB: 1931-04-16, 79 y.o.   MRN: [id number]  Subjective/Complaints: 79 y.o. right-handed male history of TIA, diabetes mellitus and dementia. 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 Admitted 05/26/2012 with right-sided weakness and slurred speech. MRI shows acute left PCA territory infarcts without hemorrhage. MRA of the head with 50% mid basilar stenosis. Proximal occlusion left PCA. Echocardiogram with ejection fraction 60% and grade 1 diastolic dysfunction. Carotid Dopplers with no ICA stenosis. Patient did not receive TPA. Neurology consulted patient maintained on aspirin 81 mg daily as prior to admission and Plavix added to regimen. Subcutaneous Lovenox for DVT prophylaxis. Currently patient is maintained on a dysphagia 1 thin liquid diet after modified barium swallow study 05/28/2012.Monitoring of cognition with noted history of dementia placed on Seroquel. C/o stinging with urination Review of Systems  Unable to perform ROS: mental acuity   Objective: Vital Signs: Blood pressure 117/68, pulse 80, temperature 97.9 F (36.6 C), temperature source Oral, resp. rate 18, height 6\' 2"  (1.88 m), weight 102.6 kg (226 lb 3.1 oz), SpO2 96.00%. Dg Swallowing Func-speech Pathology  05/29/2012  Lacinda Axon, CCC-SLP     05/29/2012 11:55 AM Objective Swallowing Evaluation: Modified Barium Swallowing Study   Patient Details  Name: Raymond Rangel MRN: [id number] Date of Birth: 10-16-1931  Today's Date: 05/29/2012 Time: 1000-1030 SLP Time Calculation (min): 30 min  Past Medical History:  Past Medical History  Diagnosis Date  . Mini stroke   . Irregular heart beat   . Blood clot in vein     left leg dvt  . Dementia   . DVT (deep venous thrombosis)   . Hypercholesteremia   . Arthritis   . Diabetes mellitus    Past Surgical History:  Past Surgical History  Procedure Laterality Date  . Left arm    . Rotator cuff repair  2000(L) 1999 (R)   HPI:  79 yo male h/o tia,  dementia comes in with rt sided weakness for  less than 24 hours.  dtr is present.  He lives with his wife.  He  was normal yesterday (his baseline is walking with cane, able to  feed himself, normally can speak clearly ) but today he is more  confused than normal, cannot answer questions appropriately and  rt side of body weak.  No sz activity.  No fevers/n/v/d.  No  rashes.  He is confused but denies any pain.  History obtained  from dtr. MRI reveals Acute left PCA territory infarct affects  the thalamus, medial temporal lobe, occipital lobe, potentially  affecting the left internal capsule.  MBS  Indicated to assess  risk for aspiration and recommend safest, possible PO diet.       Assessment / Plan / Recommendation Clinical Impression  Dysphagia Diagnosis: Moderate oral phase dysphagia;Moderate  pharyngeal phase dysphagia Moderate sensory motor oral dysphagia marked by the following:  poor oral awareness, ineffective vertical mastication, decreased  lingual strength and coordination resulting in poor bolus  cohesion and transit.  Majority of mechanical soft bolus wedged  on alveolar ridge requiring max verbal and tactile cues to remove  bolus with tongue sweep.  Whole barium tablet administered in  puree with tablet remaining on anterior portion of tongue.   Patient unable to transit pill and eventually expectorated pill.   Moderate sensory motor pharyngeal phase dysphagia.  Trace  penetration during swallow of nectar by cup and straw  consistently mainly due to residuals in pyriforms.  Initiation of  swallow with thin by cup and straw at pyriforms with trace silent  aspiration x1 out of multiple trials.  Prominent, to what  appeared to be, osteophyte at C-4 slowing bolus transit and  increasing residuals at level of pyriforms and CP segment.  Residuals at pyriforms cleared with cued second swallow.  Brief  esophageal screen indicates functional bolus transit.  Recommend  to proceed with dysphagia 1 (puree) and  thin liquids with full  assist with each meals to ensure safety.  Recommend puree mainly  due to severity of oral dysphagia.  Please note patient required  hand over hand assist for self feeding with majority of PO trials  administered by treating SLP.  Only trace silent aspiration  occurred with nectar thick liquids and x1 with thin liquid but  patient was in optimal upright positioning.       Results for orders placed during the hospital encounter of 05/30/12 (from the past 72 hour(s))  CBC     Status: Abnormal   Collection Time    05/30/12  5:50 PM      Result Value Range   WBC 8.2  4.0 - 10.5 K/uL   RBC 4.53  4.22 - 5.81 MIL/uL   Hemoglobin 13.5  13.0 - 17.0 g/dL   HCT 45.4 (*) 09.8 - 11.9 %   MCV 85.2  78.0 - 100.0 fL   MCH 29.8  26.0 - 34.0 pg   MCHC 35.0  30.0 - 36.0 g/dL   RDW 14.7  82.9 - 56.2 %   Platelets 152  150 - 400 K/uL  CREATININE, SERUM     Status: Abnormal   Collection Time    05/30/12  5:50 PM      Result Value Range   Creatinine, Ser 0.75  0.50 - 1.35 mg/dL   GFR calc non Af Amer 86 (*) >90 mL/min   GFR calc Af Amer >90  >90 mL/min   Comment:            The eGFR has been calculated     using the CKD EPI equation.     This calculation has not been     validated in all clinical     situations.     eGFR's persistently     <90 mL/min signify     possible Chronic Kidney Disease.  GLUCOSE, CAPILLARY     Status: Abnormal   Collection Time    05/30/12  8:49 PM      Result Value Range   Glucose-Capillary 121 (*) 70 - 99 mg/dL   Comment 1 Notify RN    CBC WITH DIFFERENTIAL     Status: Abnormal   Collection Time    05/31/12  6:35 AM      Result Value Range   WBC 12.2 (*) 4.0 - 10.5 K/uL   RBC 4.95  4.22 - 5.81 MIL/uL   Hemoglobin 14.2  13.0 - 17.0 g/dL   HCT 13.0  86.5 - 78.4 %   MCV 85.7  78.0 - 100.0 fL   MCH 28.7  26.0 - 34.0 pg   MCHC 33.5  30.0 - 36.0 g/dL   RDW 69.6  29.5 - 28.4 %   Platelets 180  150 - 400 K/uL   Neutrophils Relative 71  43 - 77 %    Neutro Abs 8.7 (*) 1.7 - 7.7 K/uL   Lymphocytes Relative 17  12 - 46 %   Lymphs Abs 2.0  0.7 - 4.0  K/uL   Monocytes Relative 7  3 - 12 %   Monocytes Absolute 0.9  0.1 - 1.0 K/uL   Eosinophils Relative 4  0 - 5 %   Eosinophils Absolute 0.5  0.0 - 0.7 K/uL   Basophils Relative 1  0 - 1 %   Basophils Absolute 0.1  0.0 - 0.1 K/uL  COMPREHENSIVE METABOLIC PANEL     Status: Abnormal   Collection Time    05/31/12  6:35 AM      Result Value Range   Sodium 139  135 - 145 mEq/L   Potassium 3.6  3.5 - 5.1 mEq/L   Chloride 102  96 - 112 mEq/L   CO2 24  19 - 32 mEq/L   Glucose, Bld 102 (*) 70 - 99 mg/dL   BUN 8  6 - 23 mg/dL   Creatinine, Ser 9.60  0.50 - 1.35 mg/dL   Calcium 9.1  8.4 - 45.4 mg/dL   Total Protein 7.5  6.0 - 8.3 g/dL   Albumin 3.4 (*) 3.5 - 5.2 g/dL   AST 23  0 - 37 U/L   ALT 15  0 - 53 U/L   Alkaline Phosphatase 107  39 - 117 U/L   Total Bilirubin 0.5  0.3 - 1.2 mg/dL   GFR calc non Af Amer 79 (*) >90 mL/min   GFR calc Af Amer >90  >90 mL/min   Comment:            The eGFR has been calculated     using the CKD EPI equation.     This calculation has not been     validated in all clinical     situations.     eGFR's persistently     <90 mL/min signify     possible Chronic Kidney Disease.  GLUCOSE, CAPILLARY     Status: None   Collection Time    05/31/12  7:21 AM      Result Value Range   Glucose-Capillary 97  70 - 99 mg/dL   Comment 1 Notify RN    GLUCOSE, CAPILLARY     Status: Abnormal   Collection Time    05/31/12 11:12 AM      Result Value Range   Glucose-Capillary 102 (*) 70 - 99 mg/dL   Comment 1 Notify RN    GLUCOSE, CAPILLARY     Status: None   Collection Time    05/31/12  4:43 PM      Result Value Range   Glucose-Capillary 76  70 - 99 mg/dL   Comment 1 Notify RN    GLUCOSE, CAPILLARY     Status: Abnormal   Collection Time    05/31/12  8:24 PM      Result Value Range   Glucose-Capillary 103 (*) 70 - 99 mg/dL   Comment 1 Notify RN    GLUCOSE,  CAPILLARY     Status: Abnormal   Collection Time    06/01/12  7:12 AM      Result Value Range   Glucose-Capillary 114 (*) 70 - 99 mg/dL   Comment 1 Notify RN       HEENT: normal Cardio: RRR Resp: CTA B/L GI: BS positive and Distention Extremity:  Pulses positive and No Edema Skin:   Intact Neuro: Alert/Oriented, Confused, Flat, Cranial Nerve II-XII normal, Abnormal Sensory Reduced sensation on Right but also has poor attention to testing, Abnormal Motor 0/5 in RUE and RLE, 4/5 LUE, 3-/5 LLE and Tone:  increased flexor tone RUE, increased extensor tone RLE Musc/Skel:  Other Old scar L antecubital fossa, non tender Gen:  NAD Cognitive: oriented to person and "room" but not hospital or CVA or date Righ homonymous hemianopsia  Assessment/Plan: 1. Functional deficits secondary to Left PCA infarct which require 3+ hours per day of interdisciplinary therapy in a comprehensive inpatient rehab setting. Physiatrist is providing close team supervision and 24 hour management of active medical problems listed below. Physiatrist and rehab team continue to assess barriers to discharge/monitor patient progress toward functional and medical goals. FIM: FIM - Bathing Bathing: 1: Total-Patient completes 0-2 of 10 parts or less than 25%  FIM - Upper Body Dressing/Undressing Upper body dressing/undressing: 1: Total-Patient completed less than 25% of tasks FIM - Lower Body Dressing/Undressing Lower body dressing/undressing: 1: Total-Patient completed less than 25% of tasks  FIM - Toileting Toileting: 0: Activity did not occur  FIM - Archivist Transfers: 1-Two helpers  FIM - Press photographer: 1: Two helpers  FIM - Locomotion: Printmaker: Wheelchair: 1: Total Assistance/staff pushes wheelchair (Pt<25%) FIM - Locomotion: Ambulation Locomotion: Ambulation Assistive Devices: Parallel bars Ambulation/Gait Assistance: 1: +2 Total assist Locomotion:  Ambulation: 1: Two helpers  Comprehension Comprehension Mode: Auditory Comprehension: 1-Understands basic less than 25% of the time/requires cueing 75% of the time  Expression Expression Mode: Verbal Expression: 1-Expresses basis less than 25% of the time/requires cueing greater than 75% of the time.  Social Interaction Social Interaction: 2-Interacts appropriately 25 - 49% of time - Needs frequent redirection.  Problem Solving Problem Solving: 1-Solves basic less than 25% of the time - needs direction nearly all the time or does not effectively solve problems and may need a restraint for safety  Memory Memory: 1-Recognizes or recalls less than 25% of the time/requires cueing greater than 75% of the time Medical Problem List and Plan:  1. embolic left PCA infarct  2. DVT Prophylaxis/Anticoagulation: Subcutaneous Lovenox. Monitor platelet counts and any signs of bleeding Hx of Left femoral L popliteal,Left tibial subacute DVT diagnosed January 2013.Will increase Lovenox to 30mg  BID  3. Mood/dementia. Seroquel 25 mg twice a day, Ativan when necessary. Patient displayed baseline cognitive deficits per family.  4. Neuropsych: This patient is not capable of making decisions on his/her own behalf.  5. Hyperlipidemia. Zocor  6. Diabetes mellitus with peripheral neuropathy. Hemoglobin A1c 6.7. Patient on diet control prior to admission. Check CBGs a.c. and at bedtime  7. Dysphagia. Dysphagia 1 thin liquids. Speech therapy followup. Monitor for any signs of aspiration.  8.  Leukocytosis with dysuria will check UA, breathing well off O2, afebrile no need for CXR at this time     LOS (Days) 2 A FACE TO FACE EVALUATION WAS PERFORMED  Erika Slaby E 06/01/2012, 8:03 AM

## 2012-06-01 NOTE — Progress Notes (Signed)
Physical Therapy Note  Patient Details  Name: Esa Raden MRN: [id number] Date of Birth: 12-21-1931 Today's Date: 06/01/2012  8:00 - 8:30 30 minutes individual session Patient denies pain.  Patient resting in bed upon entering room. Patient oriented to person - not to place or day of week - patient said he did not know where he was and it was "Monday." Patient assisted with donning pants due to time. Patient required +2 for rolling side to side and to pull up pants. Patient supine to sit with +2 assist. Patient tends to have increased extensor tone of right UE and LE when you ask him to move. Patient sat edge of bed with min assist for balance. Patient performed stand pivot transfer with max assist of 1 and an additional person to stand by. Patient stood at sink for 2 minutes and washed face. Patient requires max assist for sit to stand and mod assist to maintain standing holding on to sink. Patient requires cueing to attend to right side. Patient sit to stand again for 2 minutes with same assistance. Patient attempted to propel wheelchair using bilateral LE's. Patient tends to extend right LE and trunk causing him to slide forward out of chair. Patient positioned in chair with quick release belt in place.    Arelia Longest M 06/01/2012, 8:43 AM

## 2012-06-01 NOTE — Plan of Care (Signed)
Problem: RH SAFETY Goal: RH STG DECREASED RISK OF FALL WITH ASSISTANCE STG Decreased Risk of Fall With min assist  Outcome: Not Progressing Patient attempted to try to get out of the bed without assistance. Frequent monitoring. Bed alarm in place and functioning for safety precaution.

## 2012-06-01 NOTE — Plan of Care (Signed)
Problem: RH BOWEL ELIMINATION Goal: RH STG MANAGE BOWEL WITH ASSISTANCE STG Manage Bowel with max assist timed toilet (prn meds only)  Outcome: Not Progressing No bowel movement since 05/30/12; incontinent  Problem: RH BLADDER ELIMINATION Goal: RH STG MANAGE BLADDER WITH ASSISTANCE STG Manage Bladder With max assist timed toileting  Outcome: Not Progressing Pt pulls off condom however does not call for assistance and timed toileting unsuccessful

## 2012-06-01 NOTE — Progress Notes (Signed)
Occupational Therapy Session Note  Patient Details  Name: Raymond Rangel MRN: [id number] Date of Birth: 1931/09/26  Today's Date: 06/01/2012 Time: 1000-1100 Time Calculation (min): 60 min  Short Term Goals: Week 1:  OT Short Term Goal 1 (Week 1): Pt will roll in bed with mod assist to assist caregivers with clothing changes. OT Short Term Goal 2 (Week 1): Pt will sit on EOB with mod assist to engage in grooming tasks. OT Short Term Goal 3 (Week 1): Pt will don shirt with max assist.  Skilled Therapeutic Interventions/Progress Updates:    Pt seen for BADL retraining with B/D at sink level.  Pt was in w/c at start of session.  Pt worked on standing at the sink with +2 for the first trial, then only needed +1 for 2nd and 3rd trials.  He needs physical cues and stabilizing to keep his feet under him as he pushes his legs into extension. Improved initiation with self care, but actions need to be automatic. Pt does not respond well to complex cuing. Today he was able to follow 50% of one step directions. Pt also worked on AROM LUE and Prom with A/Arom of RUE.  Therapy Documentation Precautions:  Precautions Precautions: Fall Restrictions Weight Bearing Restrictions: No       Pain: Pain Assessment Pain Assessment: No/denies pain ADL: See FIM for current functional status  Therapy/Group: Individual Therapy  Sayler Mickiewicz 06/01/2012, 11:13 AM

## 2012-06-02 ENCOUNTER — Inpatient Hospital Stay (HOSPITAL_COMMUNITY): Payer: Medicare Other | Admitting: Speech Pathology

## 2012-06-02 ENCOUNTER — Inpatient Hospital Stay (HOSPITAL_COMMUNITY): Payer: Medicare Other | Admitting: Occupational Therapy

## 2012-06-02 ENCOUNTER — Inpatient Hospital Stay (HOSPITAL_COMMUNITY): Payer: Medicare Other | Admitting: *Deleted

## 2012-06-02 DIAGNOSIS — I6330 Cerebral infarction due to thrombosis of unspecified cerebral artery: Secondary | ICD-10-CM

## 2012-06-02 LAB — GLUCOSE, CAPILLARY
Glucose-Capillary: 77 mg/dL (ref 70–99)
Glucose-Capillary: 77 mg/dL (ref 70–99)

## 2012-06-02 MED ORDER — CIPROFLOXACIN HCL 250 MG PO TABS
250.0000 mg | ORAL_TABLET | Freq: Two times a day (BID) | ORAL | Status: DC
  Administered 2012-06-02 – 2012-06-12 (×22): 250 mg via ORAL
  Filled 2012-06-02 (×26): qty 1

## 2012-06-02 NOTE — Progress Notes (Signed)
Occupational Therapy Session Note  Patient Details  Name: Raymond Rangel MRN: [id number] Date of Birth: November 14, 1931  Today's Date: 06/02/2012 Time: 1000-1100 and 1305-1400 Time Calculation (min): 60 min and 55 min  Short Term Goals: Week 1:  OT Short Term Goal 1 (Week 1): Pt will roll in bed with mod assist to assist caregivers with clothing changes. OT Short Term Goal 2 (Week 1): Pt will sit on EOB with mod assist to engage in grooming tasks. OT Short Term Goal 3 (Week 1): Pt will don shirt with max assist.  Skilled Therapeutic Interventions/Progress Updates:    Visit 1:  No c/o pain. Pt was laying in bed upon arrival. Pt seen for ADL retraining of B/D from bed level. Pt did help roll with max assist to allow caregiver to don pants.  Pt sat to EOB with mod to max assist and then transferred to w/c with max assist to complete UB ADLS.  He does attempt to use RUE more often but has poor control/ coordination. Pt then seen in gym to work on RUE coordination with towel slides, grasping and releasing cones, dowel bar. Pt needs mod cuing to focus on right side. Will continue this in the second session.  Visit 2:  No c/o pain this session. Pt seen to work on RUE and trunk motor control with sitting balance and mat exercises.  Pt was able to transfer using scoot pivots to mat with RLE stabilized and only mod assist. He was able to sit upright on mat with supervision. He worked on forward and lateral leaning and reaching with slow rhythmic movements to break up his tone. Pt able to keep RLE in knee flexion with his w/c placed in front of him while he was sitting on the mat. Used ball to facilitate smooth movement patterns of right shoulder in flexion/ extension, abduction/adduction.  Worked with dowel bar again, but very difficult for patient to hold onto dowel smoothly with both hands.  Also attempted to use UBE to facilitate natural movement pattern but pt has severe motor impersistence in right hand. Needed  max assist to maintain grip on handle. Overall, pt participated well.   Therapy Documentation Precautions:  Precautions Precautions: Fall Precaution Comments: increased tone right side (extensor in leg, flexor in arm) Restrictions Weight Bearing Restrictions: No   Pain: Pain Assessment Pain Assessment: No/denies pain ADL:  See FIM for current functional status  Therapy/Group: Individual Therapy  Zeynep Fantroy 06/02/2012, 11:57 AM

## 2012-06-02 NOTE — Progress Notes (Signed)
Physical Therapy Note  Patient Details  Name: Raymond Rangel MRN: [id number] Date of Birth: 22-Dec-1931 Today's Date: 06/02/2012  1400-1455 (55 minutes) individual Pain: no reported pain Focus of treatment: Neuro re-ed RT LE; sitting alignment; standing tolerance/allignment Treatment: Neuro re-ed RT LE - attempted AA bilateral hip flexion /extension using therapy ball in supine with pt maintaining RT knee extension . Pt at times able to follow directions to maintain knee flexion over ball but returns to extended knee with movement; supine to sit min assist with instructional cues for technique; sitting edge of mat - pt sits with RT knee extended and posterior pelvic tilt (unable to correct with max tactile cues)  close SBA to intermittent min assist with lean to left ; standing X 2 using SARA+ . Pt reports no symptoms in standing ( 5 minutes X 2 ) with tactile cues for hip/trunk extension.    Inanna Telford,JIM 06/02/2012, 2:58 PM

## 2012-06-02 NOTE — Progress Notes (Signed)
Patient ID: Raymond Rangel, male   DOB: December 01, 1931, 79 y.o.   MRN: [id number]  Subjective/Complaints: 79 y.o. right-handed male history of TIA, diabetes mellitus and dementia. 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 Admitted 05/26/2012 with right-sided weakness and slurred speech. MRI shows acute left PCA territory infarcts without hemorrhage. MRA of the head with 50% mid basilar stenosis. Proximal occlusion left PCA. Echocardiogram with ejection fraction 60% and grade 1 diastolic dysfunction. Carotid Dopplers with no ICA stenosis. Patient did not receive TPA. Neurology consulted patient maintained on aspirin 81 mg daily as prior to admission and Plavix added to regimen. Subcutaneous Lovenox for DVT prophylaxis. Currently patient is maintained on a dysphagia 1 thin liquid diet after modified barium swallow study 05/28/2012.Monitoring of cognition with noted history of dementia placed on Seroquel.   No complaints. Slept well last night. Review of Systems  Unable to perform ROS: mental acuity   Objective: Vital Signs: Blood pressure 130/72, pulse 94, temperature 97.8 F (36.6 C), temperature source Oral, resp. rate 18, height 6\' 2"  (1.88 m), weight 102.6 kg (226 lb 3.1 oz), SpO2 98.00%. Dg Swallowing Func-speech Pathology  05/29/2012  Lacinda Axon, CCC-SLP     05/29/2012 11:55 AM Objective Swallowing Evaluation: Modified Barium Swallowing Study   Patient Details  Name: Raymond Rangel MRN: [id number] Date of Birth: Oct 09, 1931  Today's Date: 05/29/2012 Time: 1000-1030 SLP Time Calculation (min): 30 min  Past Medical History:  Past Medical History  Diagnosis Date  . Mini stroke   . Irregular heart beat   . Blood clot in vein     left leg dvt  . Dementia   . DVT (deep venous thrombosis)   . Hypercholesteremia   . Arthritis   . Diabetes mellitus    Past Surgical History:  Past Surgical History  Procedure Laterality Date  . Left arm    . Rotator cuff repair  2000(L) 1999 (R)   HPI:  79 yo  male h/o tia, dementia comes in with rt sided weakness for  less than 24 hours.  dtr is present.  He lives with his wife.  He  was normal yesterday (his baseline is walking with cane, able to  feed himself, normally can speak clearly ) but today he is more  confused than normal, cannot answer questions appropriately and  rt side of body weak.  No sz activity.  No fevers/n/v/d.  No  rashes.  He is confused but denies any pain.  History obtained  from dtr. MRI reveals Acute left PCA territory infarct affects  the thalamus, medial temporal lobe, occipital lobe, potentially  affecting the left internal capsule.  MBS  Indicated to assess  risk for aspiration and recommend safest, possible PO diet.       Assessment / Plan / Recommendation Clinical Impression  Dysphagia Diagnosis: Moderate oral phase dysphagia;Moderate  pharyngeal phase dysphagia Moderate sensory motor oral dysphagia marked by the following:  poor oral awareness, ineffective vertical mastication, decreased  lingual strength and coordination resulting in poor bolus  cohesion and transit.  Majority of mechanical soft bolus wedged  on alveolar ridge requiring max verbal and tactile cues to remove  bolus with tongue sweep.  Whole barium tablet administered in  puree with tablet remaining on anterior portion of tongue.   Patient unable to transit pill and eventually expectorated pill.   Moderate sensory motor pharyngeal phase dysphagia.  Trace  penetration during swallow of nectar by cup and straw  consistently mainly due  to residuals in pyriforms.  Initiation of  swallow with thin by cup and straw at pyriforms with trace silent  aspiration x1 out of multiple trials.  Prominent, to what  appeared to be, osteophyte at C-4 slowing bolus transit and  increasing residuals at level of pyriforms and CP segment.  Residuals at pyriforms cleared with cued second swallow.  Brief  esophageal screen indicates functional bolus transit.  Recommend  to proceed with dysphagia  1 (puree) and thin liquids with full  assist with each meals to ensure safety.  Recommend puree mainly  due to severity of oral dysphagia.  Please note patient required  hand over hand assist for self feeding with majority of PO trials  administered by treating SLP.  Only trace silent aspiration  occurred with nectar thick liquids and x1 with thin liquid but  patient was in optimal upright positioning.       Results for orders placed during the hospital encounter of 05/30/12 (from the past 72 hour(s))  CBC     Status: Abnormal   Collection Time    05/30/12  5:50 PM      Result Value Range   WBC 8.2  4.0 - 10.5 K/uL   RBC 4.53  4.22 - 5.81 MIL/uL   Hemoglobin 13.5  13.0 - 17.0 g/dL   HCT 16.1 (*) 09.6 - 04.5 %   MCV 85.2  78.0 - 100.0 fL   MCH 29.8  26.0 - 34.0 pg   MCHC 35.0  30.0 - 36.0 g/dL   RDW 40.9  81.1 - 91.4 %   Platelets 152  150 - 400 K/uL  CREATININE, SERUM     Status: Abnormal   Collection Time    05/30/12  5:50 PM      Result Value Range   Creatinine, Ser 0.75  0.50 - 1.35 mg/dL   GFR calc non Af Amer 86 (*) >90 mL/min   GFR calc Af Amer >90  >90 mL/min   Comment:            The eGFR has been calculated     using the CKD EPI equation.     This calculation has not been     validated in all clinical     situations.     eGFR's persistently     <90 mL/min signify     possible Chronic Kidney Disease.  GLUCOSE, CAPILLARY     Status: Abnormal   Collection Time    05/30/12  8:49 PM      Result Value Range   Glucose-Capillary 121 (*) 70 - 99 mg/dL   Comment 1 Notify RN    CBC WITH DIFFERENTIAL     Status: Abnormal   Collection Time    05/31/12  6:35 AM      Result Value Range   WBC 12.2 (*) 4.0 - 10.5 K/uL   RBC 4.95  4.22 - 5.81 MIL/uL   Hemoglobin 14.2  13.0 - 17.0 g/dL   HCT 78.2  95.6 - 21.3 %   MCV 85.7  78.0 - 100.0 fL   MCH 28.7  26.0 - 34.0 pg   MCHC 33.5  30.0 - 36.0 g/dL   RDW 08.6  57.8 - 46.9 %   Platelets 180  150 - 400 K/uL   Neutrophils Relative  71  43 - 77 %   Neutro Abs 8.7 (*) 1.7 - 7.7 K/uL   Lymphocytes Relative 17  12 - 46 %   Lymphs Abs  2.0  0.7 - 4.0 K/uL   Monocytes Relative 7  3 - 12 %   Monocytes Absolute 0.9  0.1 - 1.0 K/uL   Eosinophils Relative 4  0 - 5 %   Eosinophils Absolute 0.5  0.0 - 0.7 K/uL   Basophils Relative 1  0 - 1 %   Basophils Absolute 0.1  0.0 - 0.1 K/uL  COMPREHENSIVE METABOLIC PANEL     Status: Abnormal   Collection Time    05/31/12  6:35 AM      Result Value Range   Sodium 139  135 - 145 mEq/L   Potassium 3.6  3.5 - 5.1 mEq/L   Chloride 102  96 - 112 mEq/L   CO2 24  19 - 32 mEq/L   Glucose, Bld 102 (*) 70 - 99 mg/dL   BUN 8  6 - 23 mg/dL   Creatinine, Ser 1.61  0.50 - 1.35 mg/dL   Calcium 9.1  8.4 - 09.6 mg/dL   Total Protein 7.5  6.0 - 8.3 g/dL   Albumin 3.4 (*) 3.5 - 5.2 g/dL   AST 23  0 - 37 U/L   ALT 15  0 - 53 U/L   Alkaline Phosphatase 107  39 - 117 U/L   Total Bilirubin 0.5  0.3 - 1.2 mg/dL   GFR calc non Af Amer 79 (*) >90 mL/min   GFR calc Af Amer >90  >90 mL/min   Comment:            The eGFR has been calculated     using the CKD EPI equation.     This calculation has not been     validated in all clinical     situations.     eGFR's persistently     <90 mL/min signify     possible Chronic Kidney Disease.  GLUCOSE, CAPILLARY     Status: None   Collection Time    05/31/12  7:21 AM      Result Value Range   Glucose-Capillary 97  70 - 99 mg/dL   Comment 1 Notify RN    GLUCOSE, CAPILLARY     Status: Abnormal   Collection Time    05/31/12 11:12 AM      Result Value Range   Glucose-Capillary 102 (*) 70 - 99 mg/dL   Comment 1 Notify RN    GLUCOSE, CAPILLARY     Status: None   Collection Time    05/31/12  4:43 PM      Result Value Range   Glucose-Capillary 76  70 - 99 mg/dL   Comment 1 Notify RN    GLUCOSE, CAPILLARY     Status: Abnormal   Collection Time    05/31/12  8:24 PM      Result Value Range   Glucose-Capillary 103 (*) 70 - 99 mg/dL   Comment 1 Notify RN     GLUCOSE, CAPILLARY     Status: Abnormal   Collection Time    06/01/12  7:12 AM      Result Value Range   Glucose-Capillary 114 (*) 70 - 99 mg/dL   Comment 1 Notify RN    GLUCOSE, CAPILLARY     Status: Abnormal   Collection Time    06/01/12 11:29 AM      Result Value Range   Glucose-Capillary 115 (*) 70 - 99 mg/dL   Comment 1 Notify RN    URINALYSIS, ROUTINE W REFLEX MICROSCOPIC     Status: Abnormal  Collection Time    06/01/12  1:00 PM      Result Value Range   Color, Urine AMBER (*) YELLOW   Comment: BIOCHEMICALS MAY BE AFFECTED BY COLOR   APPearance TURBID (*) CLEAR   Specific Gravity, Urine 1.021  1.005 - 1.030   pH 6.0  5.0 - 8.0   Glucose, UA NEGATIVE  NEGATIVE mg/dL   Hgb urine dipstick LARGE (*) NEGATIVE   Bilirubin Urine NEGATIVE  NEGATIVE   Ketones, ur NEGATIVE  NEGATIVE mg/dL   Protein, ur 147 (*) NEGATIVE mg/dL   Urobilinogen, UA 0.2  0.0 - 1.0 mg/dL   Nitrite POSITIVE (*) NEGATIVE   Leukocytes, UA LARGE (*) NEGATIVE  URINE MICROSCOPIC-ADD ON     Status: Abnormal   Collection Time    06/01/12  1:00 PM      Result Value Range   Squamous Epithelial / LPF RARE  RARE   WBC, UA TOO NUMEROUS TO COUNT  <3 WBC/hpf   RBC / HPF 11-20  <3 RBC/hpf   Bacteria, UA MANY (*) RARE   Urine-Other OVAL FAT BODIES    GLUCOSE, CAPILLARY     Status: Abnormal   Collection Time    06/01/12  4:23 PM      Result Value Range   Glucose-Capillary 100 (*) 70 - 99 mg/dL   Comment 1 Notify RN    GLUCOSE, CAPILLARY     Status: Abnormal   Collection Time    06/01/12  8:52 PM      Result Value Range   Glucose-Capillary 293 (*) 70 - 99 mg/dL     HEENT: normal Cardio: RRR Resp: CTA B/L GI: BS positive and Distention Extremity:  Pulses positive and No Edema Skin:   Intact Neuro: Alert/Oriented, Confused but seemed more attentive today, Flat, Cranial Nerve II-XII normal, Abnormal Sensory Reduced sensation on Right but also has poor attention to testing, Abnormal Motor 0/5 in RUE and  RLE, 4/5 LUE, 3-/5 LLE and Tone:  increased flexor tone RUE, increased extensor tone RLE Musc/Skel:  Other Old scar L antecubital fossa, non tender Gen:  NAD Cognitive: oriented to person and to hospital but not date Righ homonymous hemianopsia persistent  Assessment/Plan: 1. Functional deficits secondary to Left PCA infarct which require 3+ hours per day of interdisciplinary therapy in a comprehensive inpatient rehab setting. Physiatrist is providing close team supervision and 24 hour management of active medical problems listed below. Physiatrist and rehab team continue to assess barriers to discharge/monitor patient progress toward functional and medical goals. FIM: FIM - Bathing Bathing Steps Patient Completed: Chest;Abdomen Bathing: 1: Total-Patient completes 0-2 of 10 parts or less than 25%  FIM - Upper Body Dressing/Undressing Upper body dressing/undressing steps patient completed: Put head through opening of pull over shirt/dress Upper body dressing/undressing: 2: Max-Patient completed 25-49% of tasks FIM - Lower Body Dressing/Undressing Lower body dressing/undressing steps patient completed: Thread/unthread left pants leg Lower body dressing/undressing: 2: Max-Patient completed 25-49% of tasks  FIM - Toileting Toileting: 0: Activity did not occur  FIM - Archivist Transfers: 1-Two helpers  FIM - Banker Devices: Arm rests Bed/Chair Transfer: 1: Two helpers;3: Supine > Sit: Mod A (lifting assist/Pt. 50-74%/lift 2 legs;3: Sit > Supine: Mod A (lifting assist/Pt. 50-74%/lift 2 legs);1: Chair or W/C > Bed: Total A (helper does all/Pt. < 25%);1: Bed > Chair or W/C: Total A (helper does all/Pt. < 25%)  FIM - Locomotion: Wheelchair Distance: 150 Locomotion: Wheelchair: 1: Total Assistance/staff  pushes wheelchair (Pt<25%) FIM - Locomotion: Ambulation Locomotion: Ambulation Assistive Devices: Walker - Rolling Ambulation/Gait  Assistance: 2: Max assist Locomotion: Ambulation: 1: Travels less than 50 ft with maximal assistance (Pt: 25 - 49%)  Comprehension Comprehension Mode: Auditory Comprehension: 2-Understands basic 25 - 49% of the time/requires cueing 51 - 75% of the time  Expression Expression Mode: Verbal Expression: 2-Expresses basic 25 - 49% of the time/requires cueing 50 - 75% of the time. Uses single words/gestures.  Social Interaction Social Interaction: 3-Interacts appropriately 50 - 74% of the time - May be physically or verbally inappropriate.  Problem Solving Problem Solving: 1-Solves basic less than 25% of the time - needs direction nearly all the time or does not effectively solve problems and may need a restraint for safety  Memory Memory: 2-Recognizes or recalls 25 - 49% of the time/requires cueing 51 - 75% of the time Medical Problem List and Plan:  1. embolic left PCA infarct  2. DVT Prophylaxis/Anticoagulation: Subcutaneous Lovenox. Monitor platelet counts and any signs of bleeding Hx of Left femoral L popliteal,Left tibial subacute DVT diagnosed January 2013.lovenox 30 bid currently 3. Mood/dementia. Seroquel 25 mg twice a day, Ativan when necessary. Patient displayed baseline cognitive deficits per family. Will expect wax and wane. 4. Neuropsych: This patient is not capable of making decisions on his/her own behalf.  5. Hyperlipidemia. Zocor  6. Diabetes mellitus with peripheral neuropathy. Hemoglobin A1c 6.7. Patient on diet control prior to admission. Check CBGs a.c. and at bedtime- fair control generally. Cover spikes with SSI for now. 7. Dysphagia. Dysphagia 1 thin liquids. Speech therapy followup. Monitor for any signs of aspiration.  8.  Leukocytosis/ hx of dysuria--UA positive, ucx pending--begin empiric  cipro  - breathing well off O2, afebrile no need for CXR at this time     LOS (Days) 3 A FACE TO FACE EVALUATION WAS PERFORMED  Rayleigh Gillyard T 06/02/2012, 8:21 AM

## 2012-06-02 NOTE — Progress Notes (Signed)
Speech Language Pathology Daily Session Note  Patient Details  Name: Raymond Rangel MRN: [id number] Date of Birth: Sep 16, 1931  Today's Date: 06/02/2012 Time: 4098-1191 Time Calculation (min): 30 min  Short Term Goals: Week 1: SLP Short Term Goal 1 (Week 1): Patient will follow 1 step directions during functional, familiar tasks with mod assist verbal and visual cues. SLP Short Term Goal 2 (Week 1): Patient will demonstrate basic problem solving during functional, familiar tasks with max assist verbal and visual cues. SLP Short Term Goal 3 (Week 1): Patient will sustain attention to basc task for 1 minute with max assist verba, visual and tactile cues. SLP Short Term Goal 4 (Week 1): Patient will consume Dys.1 textures and thin liquid with mod assist verbal, visual and tactile cues with no overt s/s of aspiration.   Skilled Therapeutic Interventions: Patient  received therapeutic  intervention for dysphagia.  He had mild cough x1 with puree.  He was able to cough, swallow, and produce clear voice easily.  He intermittently had audible swallow with thin liquids but no cough or throat clear.  He required minimum verbal and tactile cues to use left hand for feeding.  He also required minimal verbal cues to refrain from attempting to use right hand, as this side has been affected from CVA, and he was not able to hold cup sufficiently or put solids on spoon adequately.  Continue with therapeutic intervention.    FIM:  FIM - Eating Eating Activity: 6: Modified consistency diet: (comment);4: Helper occasionally scoops food on utensil;4: Helper occasionally brings food to mouth;5: Needs verbal cues/supervision;5: Set-up assist for open containers  Pain Pain Assessment Pain Assessment: No/denies pain  Therapy/Group: Individual Therapy  Lenny Pastel 06/02/2012, 11:32 AM

## 2012-06-03 ENCOUNTER — Inpatient Hospital Stay (HOSPITAL_COMMUNITY): Payer: Medicare Other | Admitting: *Deleted

## 2012-06-03 LAB — URINE CULTURE: Colony Count: >=100000

## 2012-06-03 LAB — GLUCOSE, CAPILLARY
Glucose-Capillary: 101 mg/dL — ABNORMAL HIGH (ref 70–99)
Glucose-Capillary: 94 mg/dL (ref 70–99)
Glucose-Capillary: 98 mg/dL (ref 70–99)

## 2012-06-03 NOTE — Progress Notes (Signed)
Patient ID: Raymond Rangel, male   DOB: 1931/06/08, 79 y.o.   MRN: [id number]  Subjective/Complaints: 79 y.o. right-handed male history of TIA, diabetes mellitus and dementia. 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 Admitted 05/26/2012 with right-sided weakness and slurred speech. MRI shows acute left PCA territory infarcts without hemorrhage. MRA of the head with 50% mid basilar stenosis. Proximal occlusion left PCA. Echocardiogram with ejection fraction 60% and grade 1 diastolic dysfunction. Carotid Dopplers with no ICA stenosis. Patient did not receive TPA. Neurology consulted patient maintained on aspirin 81 mg daily as prior to admission and Plavix added to regimen. Subcutaneous Lovenox for DVT prophylaxis. Currently patient is maintained on a dysphagia 1 thin liquid diet after modified barium swallow study 05/28/2012.Monitoring of cognition with noted history of dementia placed on Seroquel.   No complaints. Pleasantly confused. Review of Systems  Unable to perform ROS: mental acuity   Objective: Vital Signs: Blood pressure 126/73, pulse 80, temperature 98.3 F (36.8 C), temperature source Oral, resp. rate 18, height 6\' 2"  (1.88 m), weight 102.6 kg (226 lb 3.1 oz), SpO2 98.00%. Dg Swallowing Func-speech Pathology  05/29/2012  Lacinda Axon, CCC-SLP     05/29/2012 11:55 AM Objective Swallowing Evaluation: Modified Barium Swallowing Study   Patient Details  Name: Raymond Rangel MRN: [id number] Date of Birth: 05/25/1931  Today's Date: 05/29/2012 Time: 1000-1030 SLP Time Calculation (min): 30 min  Past Medical History:  Past Medical History  Diagnosis Date  . Mini stroke   . Irregular heart beat   . Blood clot in vein     left leg dvt  . Dementia   . DVT (deep venous thrombosis)   . Hypercholesteremia   . Arthritis   . Diabetes mellitus    Past Surgical History:  Past Surgical History  Procedure Laterality Date  . Left arm    . Rotator cuff repair  2000(L) 1999 (R)   HPI:  79 yo  male h/o tia, dementia comes in with rt sided weakness for  less than 24 hours.  dtr is present.  He lives with his wife.  He  was normal yesterday (his baseline is walking with cane, able to  feed himself, normally can speak clearly ) but today he is more  confused than normal, cannot answer questions appropriately and  rt side of body weak.  No sz activity.  No fevers/n/v/d.  No  rashes.  He is confused but denies any pain.  History obtained  from dtr. MRI reveals Acute left PCA territory infarct affects  the thalamus, medial temporal lobe, occipital lobe, potentially  affecting the left internal capsule.  MBS  Indicated to assess  risk for aspiration and recommend safest, possible PO diet.       Assessment / Plan / Recommendation Clinical Impression  Dysphagia Diagnosis: Moderate oral phase dysphagia;Moderate  pharyngeal phase dysphagia Moderate sensory motor oral dysphagia marked by the following:  poor oral awareness, ineffective vertical mastication, decreased  lingual strength and coordination resulting in poor bolus  cohesion and transit.  Majority of mechanical soft bolus wedged  on alveolar ridge requiring max verbal and tactile cues to remove  bolus with tongue sweep.  Whole barium tablet administered in  puree with tablet remaining on anterior portion of tongue.   Patient unable to transit pill and eventually expectorated pill.   Moderate sensory motor pharyngeal phase dysphagia.  Trace  penetration during swallow of nectar by cup and straw  consistently mainly due to residuals  in pyriforms.  Initiation of  swallow with thin by cup and straw at pyriforms with trace silent  aspiration x1 out of multiple trials.  Prominent, to what  appeared to be, osteophyte at C-4 slowing bolus transit and  increasing residuals at level of pyriforms and CP segment.  Residuals at pyriforms cleared with cued second swallow.  Brief  esophageal screen indicates functional bolus transit.  Recommend  to proceed with dysphagia  1 (puree) and thin liquids with full  assist with each meals to ensure safety.  Recommend puree mainly  due to severity of oral dysphagia.  Please note patient required  hand over hand assist for self feeding with majority of PO trials  administered by treating SLP.  Only trace silent aspiration  occurred with nectar thick liquids and x1 with thin liquid but  patient was in optimal upright positioning.       Results for orders placed during the hospital encounter of 05/30/12 (from the past 72 hour(s))  GLUCOSE, CAPILLARY     Status: Abnormal   Collection Time    05/31/12 11:12 AM      Result Value Range   Glucose-Capillary 102 (*) 70 - 99 mg/dL   Comment 1 Notify RN    GLUCOSE, CAPILLARY     Status: None   Collection Time    05/31/12  4:43 PM      Result Value Range   Glucose-Capillary 76  70 - 99 mg/dL   Comment 1 Notify RN    GLUCOSE, CAPILLARY     Status: Abnormal   Collection Time    05/31/12  8:24 PM      Result Value Range   Glucose-Capillary 103 (*) 70 - 99 mg/dL   Comment 1 Notify RN    GLUCOSE, CAPILLARY     Status: Abnormal   Collection Time    06/01/12  7:12 AM      Result Value Range   Glucose-Capillary 114 (*) 70 - 99 mg/dL   Comment 1 Notify RN    GLUCOSE, CAPILLARY     Status: Abnormal   Collection Time    06/01/12 11:29 AM      Result Value Range   Glucose-Capillary 115 (*) 70 - 99 mg/dL   Comment 1 Notify RN    URINALYSIS, ROUTINE W REFLEX MICROSCOPIC     Status: Abnormal   Collection Time    06/01/12  1:00 PM      Result Value Range   Color, Urine AMBER (*) YELLOW   Comment: BIOCHEMICALS MAY BE AFFECTED BY COLOR   APPearance TURBID (*) CLEAR   Specific Gravity, Urine 1.021  1.005 - 1.030   pH 6.0  5.0 - 8.0   Glucose, UA NEGATIVE  NEGATIVE mg/dL   Hgb urine dipstick LARGE (*) NEGATIVE   Bilirubin Urine NEGATIVE  NEGATIVE   Ketones, ur NEGATIVE  NEGATIVE mg/dL   Protein, ur 829 (*) NEGATIVE mg/dL   Urobilinogen, UA 0.2  0.0 - 1.0 mg/dL   Nitrite  POSITIVE (*) NEGATIVE   Leukocytes, UA LARGE (*) NEGATIVE  URINE MICROSCOPIC-ADD ON     Status: Abnormal   Collection Time    06/01/12  1:00 PM      Result Value Range   Squamous Epithelial / LPF RARE  RARE   WBC, UA TOO NUMEROUS TO COUNT  <3 WBC/hpf   RBC / HPF 11-20  <3 RBC/hpf   Bacteria, UA MANY (*) RARE   Urine-Other OVAL FAT BODIES  URINE CULTURE     Status: None   Collection Time    06/01/12  1:00 PM      Result Value Range   Specimen Description URINE, CLEAN CATCH     Special Requests NONE     Culture  Setup Time 06/01/2012 14:09     Colony Count >=100,000 COLONIES/ML     Culture ESCHERICHIA COLI     Report Status PENDING    GLUCOSE, CAPILLARY     Status: Abnormal   Collection Time    06/01/12  4:23 PM      Result Value Range   Glucose-Capillary 100 (*) 70 - 99 mg/dL   Comment 1 Notify RN    GLUCOSE, CAPILLARY     Status: Abnormal   Collection Time    06/01/12  8:52 PM      Result Value Range   Glucose-Capillary 293 (*) 70 - 99 mg/dL  GLUCOSE, CAPILLARY     Status: None   Collection Time    06/02/12  5:21 PM      Result Value Range   Glucose-Capillary 77  70 - 99 mg/dL  GLUCOSE, CAPILLARY     Status: None   Collection Time    06/02/12  9:52 PM      Result Value Range   Glucose-Capillary 77  70 - 99 mg/dL  GLUCOSE, CAPILLARY     Status: Abnormal   Collection Time    06/03/12  7:33 AM      Result Value Range   Glucose-Capillary 101 (*) 70 - 99 mg/dL     HEENT: normal Cardio: RRR Resp: CTA B/L GI: BS positive and Distention Extremity:  Pulses positive and No Edema Skin:   Intact Neuro: Alert/Oriented, Confused but   attentive today. Doesn't realize he's in the hospital or what has happened to him. Flat, Cranial Nerve II-XII normal, Abnormal Sensory Reduced sensation on Right but also has poor attention to testing, Abnormal Motor 0/5 in RUE and RLE, 4/5 LUE, 3-/5 LLE and Tone:  increased flexor tone RUE, increased extensor tone RLE Musc/Skel:  Other Old  scar L antecubital fossa, non tender Gen:  NAD Cognitive: oriented to person and to hospital but not date Righ homonymous hemianopsia persistent  Assessment/Plan: 1. Functional deficits secondary to Left PCA infarct which require 3+ hours per day of interdisciplinary therapy in a comprehensive inpatient rehab setting. Physiatrist is providing close team supervision and 24 hour management of active medical problems listed below. Physiatrist and rehab team continue to assess barriers to discharge/monitor patient progress toward functional and medical goals. FIM: FIM - Bathing Bathing Steps Patient Completed: Chest;Abdomen Bathing: 1: Total-Patient completes 0-2 of 10 parts or less than 25%  FIM - Upper Body Dressing/Undressing Upper body dressing/undressing steps patient completed: Put head through opening of pull over shirt/dress Upper body dressing/undressing: 2: Max-Patient completed 25-49% of tasks FIM - Lower Body Dressing/Undressing Lower body dressing/undressing steps patient completed: Thread/unthread left pants leg Lower body dressing/undressing: 1: Total-Patient completed less than 25% of tasks  FIM - Toileting Toileting: 0: Activity did not occur  FIM - Archivist Transfers: 1-Two helpers  FIM - Banker Devices: Arm rests Bed/Chair Transfer: 2: Bed > Chair or W/C: Max A (lift and lower assist);2: Supine > Sit: Max A (lifting assist/Pt. 25-49%)  FIM - Locomotion: Wheelchair Distance: 150 Locomotion: Wheelchair: 1: Total Assistance/staff pushes wheelchair (Pt<25%) FIM - Locomotion: Ambulation Locomotion: Ambulation Assistive Devices: Designer, industrial/product Ambulation/Gait Assistance: 2: Max assist  Locomotion: Ambulation: 1: Travels less than 50 ft with maximal assistance (Pt: 25 - 49%)  Comprehension Comprehension Mode: Auditory Comprehension: 2-Understands basic 25 - 49% of the time/requires cueing 51 - 75% of the  time  Expression Expression Mode: Verbal Expression: 2-Expresses basic 25 - 49% of the time/requires cueing 50 - 75% of the time. Uses single words/gestures.  Social Interaction Social Interaction: 3-Interacts appropriately 50 - 74% of the time - May be physically or verbally inappropriate.  Problem Solving Problem Solving: 1-Solves basic less than 25% of the time - needs direction nearly all the time or does not effectively solve problems and may need a restraint for safety  Memory Memory: 1-Recognizes or recalls less than 25% of the time/requires cueing greater than 75% of the time Medical Problem List and Plan:  1. embolic left PCA infarct  2. DVT Prophylaxis/Anticoagulation: Subcutaneous Lovenox. Monitor platelet counts and any signs of bleeding Hx of Left femoral L popliteal,Left tibial subacute DVT diagnosed January 2013.lovenox 30 bid currently 3. Mood/dementia. Seroquel 25 mg twice a day, Ativan when necessary. Patient displayed baseline cognitive deficits per family. Will expect wax and wane. 4. Neuropsych: This patient is not capable of making decisions on his/her own behalf.  5. Hyperlipidemia. Zocor  6. Diabetes mellitus with peripheral neuropathy. Hemoglobin A1c 6.7. Patient on diet control prior to admission. Check CBGs a.c. and at bedtime- fair control generally. Cover spikes with SSI for now. 7. Dysphagia. Dysphagia 1 thin liquids. Speech therapy followup. Monitor for any signs of aspiration.  8.  Leukocytosis/ hx of dysuria--100k E Coli--already on empiric  cipro  - breathing well off O2, afebrile no need for CXR at this time     LOS (Days) 4 A FACE TO FACE EVALUATION WAS PERFORMED  SWARTZ,ZACHARY T 06/03/2012, 7:56 AM

## 2012-06-03 NOTE — Progress Notes (Signed)
Physical Therapy Note  Patient Details  Name: Kingsten Enfield MRN: [id number] Date of Birth: 10-04-1931 Today's Date: 06/03/2012  1300-1355 (55 minutes) individual Pain : no reported pain Focus of treatment: Standing / gait to facilitate weightbearing bilateral LEs  Treatment : Pt up in wc; transfer mod/max scoot to squat/pivot (Bobath type transfer to facilitate forward trunk flexion); gait  15 feet X 3 with EVA walker/ Maxi Sky + 2 assist to facilitate bilateral hip extension, prevent scissoring RT LE during stepping and to guide assistive device. Pt maintains RT LE in extension and LT knee partially flexed but able to advance LEs without assist (pt takes larger step on right).   Keamber Macfadden,JIM 06/03/2012, 1:55 PM

## 2012-06-04 ENCOUNTER — Inpatient Hospital Stay (HOSPITAL_COMMUNITY): Payer: Medicare Other | Admitting: Speech Pathology

## 2012-06-04 ENCOUNTER — Inpatient Hospital Stay (HOSPITAL_COMMUNITY): Payer: Medicare Other

## 2012-06-04 ENCOUNTER — Inpatient Hospital Stay (HOSPITAL_COMMUNITY): Payer: Medicare Other | Admitting: Occupational Therapy

## 2012-06-04 DIAGNOSIS — G8110 Spastic hemiplegia affecting unspecified side: Secondary | ICD-10-CM

## 2012-06-04 DIAGNOSIS — I6330 Cerebral infarction due to thrombosis of unspecified cerebral artery: Secondary | ICD-10-CM

## 2012-06-04 DIAGNOSIS — I69998 Other sequelae following unspecified cerebrovascular disease: Secondary | ICD-10-CM

## 2012-06-04 DIAGNOSIS — R209 Unspecified disturbances of skin sensation: Secondary | ICD-10-CM

## 2012-06-04 LAB — GLUCOSE, CAPILLARY
Glucose-Capillary: 99 mg/dL (ref 70–99)
Glucose-Capillary: 107 mg/dL — ABNORMAL HIGH (ref 70–99)

## 2012-06-04 NOTE — Progress Notes (Signed)
Speech Language Pathology Daily Session Note  Patient Details  Name: Raymond Rangel MRN: [id number] Date of Birth: Aug 30, 1931  Today's Date: 06/04/2012 Time: 7829-5621 Time Calculation (min): 45 min  Short Term Goals: Week 1: SLP Short Term Goal 1 (Week 1): Patient will follow 1 step directions during functional, familiar tasks with mod assist verbal and visual cues. SLP Short Term Goal 2 (Week 1): Patient will demonstrate basic problem solving during functional, familiar tasks with max assist verbal and visual cues. SLP Short Term Goal 3 (Week 1): Patient will sustain attention to basc task for 1 minute with max assist verba, visual and tactile cues. SLP Short Term Goal 4 (Week 1): Patient will consume Dys.1 textures and thin liquid with mod assist verbal, visual and tactile cues with no overt s/s of aspiration.   Skilled Therapeutic Interventions: Skilled treatment session focused on addressing dysphagia and self-feeding goals. Patient positioned upright in his bed and SLP facilitated session by setting up tray of Dys.1 textures and thin liquids. Patient consumed meal with mod assist verbal and visual cues to initiate bites and utilization of multiple swallows and Min A verbal cues to clear oral residue and to utilize thin liquid wash. Patient demonstrated strong cough response X1 of pureed eggs. No overt s/s during consumption of thin liquids. Recommend to continue with current plan of care and strict full supervision.    FIM:  Comprehension Comprehension Mode: Auditory Comprehension: 4-Understands basic 75 - 89% of the time/requires cueing 10 - 24% of the time Expression Expression Mode: Verbal Expression: 2-Expresses basic 25 - 49% of the time/requires cueing 50 - 75% of the time. Uses single words/gestures. Social Interaction Social Interaction: 3-Interacts appropriately 50 - 74% of the time - May be physically or verbally inappropriate. Problem Solving Problem Solving: 1-Solves basic  less than 25% of the time - needs direction nearly all the time or does not effectively solve problems and may need a restraint for safety Memory Memory: 1-Recognizes or recalls less than 25% of the time/requires cueing greater than 75% of the time FIM - Eating Eating Activity: 5: Supervision/cues;5: Set-up assist for open containers;4: Helper occasionally scoops food on utensil  Pain Pain Assessment Pain Assessment: No/denies pain  Therapy/Group: Individual Therapy  Jaanvi Fizer 06/04/2012, 4:04 PM

## 2012-06-04 NOTE — Progress Notes (Signed)
Patient incontinent of urine after condom cath came off. Toileted this AM and voided 150cc's of urine with sediment. Difficult to turn patient to left side because of increased tone to RLE. A&O to self-bedalarm in place for safety. White coating on tongue. meds given crushed in applesauce. Raymond Rangel A

## 2012-06-04 NOTE — Plan of Care (Signed)
Problem: RH BOWEL ELIMINATION Goal: RH STG MANAGE BOWEL WITH ASSISTANCE STG Manage Bowel with max assist timed toilet (prn meds only)  Outcome: Not Progressing Continued need for laxative for BM  Problem: RH BLADDER ELIMINATION Goal: RH STG MANAGE BLADDER WITH ASSISTANCE STG Manage Bladder With max assist timed toileting  Outcome: Not Progressing Continued need for timed toileting and staff manage urinal or change brief when soiled

## 2012-06-04 NOTE — Progress Notes (Signed)
Occupational Therapy Session Note  Patient Details  Name: Raymond Rangel MRN: [id number] Date of Birth: 04-12-1931  Today's Date: 06/04/2012 Time: 1000-1100 Time Calculation (min): 60 min  Short Term Goals: Week 1:  OT Short Term Goal 1 (Week 1): Pt will roll in bed with mod assist to assist caregivers with clothing changes. OT Short Term Goal 2 (Week 1): Pt will sit on EOB with mod assist to engage in grooming tasks. OT Short Term Goal 3 (Week 1): Pt will don shirt with max assist.  Skilled Therapeutic Interventions/Progress Updates:   No c/o pain this session.  Pt seen for ADL retraining of B/D at sink level with a focus on use of RUE with bathing and brushing teeth and functional mobility of sit to stand and standing at the sink. Pt was able to come into a stand with only mod assist as therapist stood on his left side and stabilized his leg.  (His right leg typically pushes out into extension also, but pt responds better with more upright posture and control when the therapist is standing on his left side.)   He then was able to remain stable with only min assist as therapist assisted pt with LB tasks.  He was able to maintain upright posture and stand close to midline.  He performs best with gentle guiding movements to tap into his automatic reactions.  He did need hand over hand guiding of RUE to brush teeth and wash left arm. Pt then worked on RUE AROM and motor control in gym with towel slides focusing on shoulder flex/ext and circular horizontal abd/add.  Pt's tone was more relaxed and he was able to follow directions to initiate movement in each of those directions. Pt left in room in w/c with quick release belt and call light within reach.  Therapy Documentation Precautions:  Precautions Precautions: Fall Precaution Comments: increased tone right side (extensor in leg, flexor in arm) Restrictions Weight Bearing Restrictions: No      ADL:  See FIM for current functional  status  Therapy/Group: Individual Therapy  Luiza Carranco 06/04/2012, 11:50 AM

## 2012-06-04 NOTE — Progress Notes (Signed)
Physical Therapy Session Note  Patient Details  Name: Raymond Rangel MRN: [id number] Date of Birth: December 14, 1931  Today's Date: 06/04/2012 Time:0902-1000 and  1350-1420 Time Calculation (min): 58 and 30 min  Short Term Goals: Week 1:  PT Short Term Goal 1 (Week 1): Pt will perform bed mobility with mod assist. PT Short Term Goal 2 (Week 1): Pt will transfer bed><w/c to L with mod assist consistently. PT Short Term Goal 3 (Week 1): Pt will perform gait x 25' with +2 assist. PT Short Term Goal 4 (Week 1): Pt will stand x 2 minutes during functional task with mod assist.   Skilled Therapeutic Interventions/Progress Updates:  AM- neuromuscular re-education via demo, tactile cues, VCs for: -bed mobility rolling Rx 1 without cues, rolling L x 5 with cues, encouragement for R knee flexion; R sidelying> sit with supervision, extra time -bed> w/c to L squat pivot with mod/max assist, VCs for R LE placement, wt shifting to L - W/c mobility using bil UEs, max assist for using r hand, attention to R hand -sit > stand at heavy table, min assist; stood x 3 minutes during RUE task , mod assist for balance -gait +3 assist x 25', Eva walker, assistance for wt shifting, preventing scissoring, progression of walker    PM- Pt asleep in bed at start of session, and somewhat confused after awakening from a nap.  He called out for his daughter several times when he felt he needed assistance with a task.  neuromuscular re-education as above for: -supine> sit with mod assist to elevate trunk; pt has problems with technique, attempting to sit up after dropping both LEs off edge of bed instead of moving into sidelying. - Sit> stand from low bed with mod assist, blocking RLE from mass extension.  Pt stood x 5, during towel folding task with max assist for balance and use of R hand.   -midline orientation, attention to R with auditory cues, visual cues; pt did attempt to visually follow R hand after cues -bed> w/c squat  pivot to R with max assist for RLE blocking, wt shifting and pivoting  Pt left with safety belt on, all needs within reach. Therapy Documentation Precautions:  Precautions Precautions: Fall Precaution Comments: increased tone right side (extensor in leg, flexor in arm) Restrictions Weight Bearing Restrictions: No   Locomotion : Ambulation Ambulation/Gait Assistance: 1: +2 Total assist Wheelchair Mobility Distance: 15       See FIM for current functional status  Therapy/Group: Individual Therapy  Bertram Haddix 06/04/2012, 2:54 PM

## 2012-06-04 NOTE — Progress Notes (Signed)
Patient ID: Raymond Rangel, male   DOB: [birth year], 79 y.o.   MRN: [id number]  Subjective/Complaints: 79 y.o. right-handed male history of TIA, diabetes mellitus and dementia. 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 Admitted 05/26/2012 with right-sided weakness and slurred speech. MRI shows acute left PCA territory infarcts without hemorrhage. MRA of the head with 50% mid basilar stenosis. Proximal occlusion left PCA. Echocardiogram with ejection fraction 60% and grade 1 diastolic dysfunction. Carotid Dopplers with no ICA stenosis. Patient did not receive TPA. Neurology consulted patient maintained on aspirin 81 mg daily as prior to admission and Plavix added to regimen. Subcutaneous Lovenox for DVT prophylaxis. Currently patient is maintained on a dysphagia 1 thin liquid diet after modified barium swallow study 05/28/2012.Monitoring of cognition with noted history of dementia placed on Seroquel.   No complaints. Pleasantly confused.Supervision with meals Review of Systems  Review of Systems  Constitutional: Negative.   Respiratory: Positive for cough. Negative for hemoptysis, sputum production, shortness of breath and wheezing.        With meals  Gastrointestinal: Negative.   Neurological: Positive for focal weakness.  All other systems reviewed and are negative.    Objective: Vital Signs: Blood pressure 121/76, pulse 80, temperature 97.9 F (36.6 C), temperature source Oral, resp. rate 18, height 6\' 2"  (1.88 m), weight 102.6 kg (226 lb 3.1 oz), SpO2 97.00%. Dg Swallowing Func-speech Pathology  05/29/2012  [name], CCC-SLP     05/29/2012 11:55 AM   Recommend  to proceed with dysphagia 1 (puree) and thin liquids with full  assist with each meals to ensure safety.  Recommend puree mainly  due to severity of oral dysphagia.  Please note patient required  hand over hand assist for self feeding with majority of PO trials  administered by treating SLP.  Only trace  silent aspiration  occurred with nectar thick liquids and x1 with thin liquid but  patient was in optimal upright positioning.       Results for orders placed during the hospital encounter of 05/30/12 (from the past 72 hour(s))  GLUCOSE, CAPILLARY     Status: Abnormal   Collection Time    06/01/12 11:29 AM      Result Value Range   Glucose-Capillary 115 (*) 70 - 99 mg/dL   Comment 1 Notify RN    URINALYSIS, ROUTINE W REFLEX MICROSCOPIC     Status: Abnormal   Collection Time    06/01/12  1:00 PM      Result Value Range   Color, Urine AMBER (*) YELLOW   Comment: BIOCHEMICALS MAY BE AFFECTED BY COLOR   APPearance TURBID (*) CLEAR   Specific Gravity, Urine 1.021  1.005 - 1.030   pH 6.0  5.0 - 8.0   Glucose, UA NEGATIVE  NEGATIVE mg/dL   Hgb urine dipstick LARGE (*) NEGATIVE   Bilirubin Urine NEGATIVE  NEGATIVE   Ketones, ur NEGATIVE  NEGATIVE mg/dL   Protein, ur 782 (*) NEGATIVE mg/dL   Urobilinogen, UA 0.2  0.0 - 1.0 mg/dL   Nitrite POSITIVE (*) NEGATIVE   Leukocytes, UA LARGE (*) NEGATIVE  URINE MICROSCOPIC-ADD ON     Status: Abnormal   Collection Time    06/01/12  1:00 PM      Result Value Range   Squamous Epithelial / LPF RARE  RARE   WBC, UA TOO NUMEROUS TO COUNT  <3 WBC/hpf   RBC / HPF 11-20  <3 RBC/hpf   Bacteria, UA MANY (*)  RARE   Urine-Other OVAL FAT BODIES    URINE CULTURE     Status: None   Collection Time    06/01/12  1:00 PM      Result Value Range   Specimen Description URINE, CLEAN CATCH     Special Requests NONE     Culture  Setup Time 06/01/2012 14:09     Colony Count >=100,000 COLONIES/ML     Culture ESCHERICHIA COLI     Report Status 06/03/2012 FINAL     Organism ID, Bacteria ESCHERICHIA COLI    GLUCOSE, CAPILLARY     Status: Abnormal   Collection Time    06/01/12  4:23 PM      Result Value Range   Glucose-Capillary 100 (*) 70 - 99 mg/dL   Comment 1 Notify RN    GLUCOSE, CAPILLARY     Status: Abnormal   Collection Time    06/01/12  8:52 PM       Result Value Range   Glucose-Capillary 293 (*) 70 - 99 mg/dL  GLUCOSE, CAPILLARY     Status: None   Collection Time    06/02/12  5:21 PM      Result Value Range   Glucose-Capillary 77  70 - 99 mg/dL  GLUCOSE, CAPILLARY     Status: None   Collection Time    06/02/12  9:52 PM      Result Value Range   Glucose-Capillary 77  70 - 99 mg/dL  GLUCOSE, CAPILLARY     Status: Abnormal   Collection Time    06/03/12  7:33 AM      Result Value Range   Glucose-Capillary 101 (*) 70 - 99 mg/dL  GLUCOSE, CAPILLARY     Status: None   Collection Time    06/03/12 12:10 PM      Result Value Range   Glucose-Capillary 94  70 - 99 mg/dL  GLUCOSE, CAPILLARY     Status: None   Collection Time    06/03/12  4:47 PM      Result Value Range   Glucose-Capillary 98  70 - 99 mg/dL  GLUCOSE, CAPILLARY     Status: Abnormal   Collection Time    06/03/12  9:17 PM      Result Value Range   Glucose-Capillary 115 (*) 70 - 99 mg/dL     HEENT: normal Cardio: RRR Resp: CTA B/L GI: BS positive and Distention Extremity:  Pulses positive and No Edema Skin:   Intact Neuro: Alert/Oriented, Confused but   attentive today. Doesn't realize he's in the hospital or what has happened to him. Flat, Cranial Nerve II-XII normal, Abnormal Sensory Reduced sensation on Right but also has poor attention to testing, Abnormal Motor 0/5 in RUE and RLE, 4/5 LUE, 3-/5 LLE and Tone:  increased flexor tone RUE, increased extensor tone RLE Musc/Skel:  Other Old scar L antecubital fossa, non tender Gen:  NAD Cognitive: oriented to person and to hospital but not date Righ homonymous hemianopsia persistent  Assessment/Plan: 1. Functional deficits secondary to Left PCA infarct which require 3+ hours per day of interdisciplinary therapy in a comprehensive inpatient rehab setting. Physiatrist is providing close team supervision and 24 hour management of active medical problems listed below. Physiatrist and rehab team continue to assess  barriers to discharge/monitor patient progress toward functional and medical goals. FIM: FIM - Bathing Bathing Steps Patient Completed: Chest;Abdomen Bathing: 1: Total-Patient completes 0-2 of 10 parts or less than 25%  FIM - Upper Body Dressing/Undressing Upper  body dressing/undressing steps patient completed: Put head through opening of pull over shirt/dress Upper body dressing/undressing: 2: Max-Patient completed 25-49% of tasks FIM - Lower Body Dressing/Undressing Lower body dressing/undressing steps patient completed: Thread/unthread left pants leg Lower body dressing/undressing: 1: Total-Patient completed less than 25% of tasks  FIM - Toileting Toileting: 0: Activity did not occur  FIM - Archivist Transfers: 1-Two helpers  FIM - Banker Devices: Arm rests Bed/Chair Transfer: 1: Two helpers  FIM - Locomotion: Wheelchair Distance: 150 Locomotion: Wheelchair: 1: Total Assistance/staff pushes wheelchair (Pt<25%) FIM - Locomotion: Ambulation Locomotion: Ambulation Assistive Devices: Designer, industrial/product Ambulation/Gait Assistance: 2: Max assist Locomotion: Ambulation: 1: Travels less than 50 ft with maximal assistance (Pt: 25 - 49%)  Comprehension Comprehension Mode: Auditory Comprehension: 2-Understands basic 25 - 49% of the time/requires cueing 51 - 75% of the time  Expression Expression Mode: Verbal Expression: 2-Expresses basic 25 - 49% of the time/requires cueing 50 - 75% of the time. Uses single words/gestures.  Social Interaction Social Interaction: 3-Interacts appropriately 50 - 74% of the time - May be physically or verbally inappropriate.  Problem Solving Problem Solving: 1-Solves basic less than 25% of the time - needs direction nearly all the time or does not effectively solve problems and may need a restraint for safety  Memory Memory: 1-Recognizes or recalls less than 25% of the time/requires cueing greater  than 75% of the time Medical Problem List and Plan:  1. embolic left PCA infarct  2. DVT Prophylaxis/Anticoagulation: Subcutaneous Lovenox. Monitor platelet counts and any signs of bleeding Hx of Left femoral L popliteal,Left tibial subacute DVT diagnosed January 2013.lovenox 30 bid currently 3. Mood/dementia. Seroquel 25 mg twice a day, Ativan when necessary. Patient displayed baseline cognitive deficits per family. Will expect wax and wane. 4. Neuropsych: This patient is not capable of making decisions on his/her own behalf.  5. Hyperlipidemia. Zocor  6. Diabetes mellitus with peripheral neuropathy. Hemoglobin A1c 6.7. Patient on diet control prior to admission. Check CBGs a.c. and at bedtime- fair control generally. Cover spikes with SSI for now. 7. Dysphagia. Dysphagia 1 thin liquids. Speech therapy followup. Monitor for any signs of aspiration.  8.  Leukocytosis with UTI--100k E Coli-- on  cipro      LOS (Days) 5 A FACE TO FACE EVALUATION WAS PERFORMED  KIRSTEINS,ANDREW E 06/04/2012, 8:51 AM

## 2012-06-05 ENCOUNTER — Inpatient Hospital Stay (HOSPITAL_COMMUNITY): Payer: Medicare Other | Admitting: Occupational Therapy

## 2012-06-05 ENCOUNTER — Inpatient Hospital Stay (HOSPITAL_COMMUNITY): Payer: Medicare Other

## 2012-06-05 LAB — GLUCOSE, CAPILLARY
Glucose-Capillary: 103 mg/dL — ABNORMAL HIGH (ref 70–99)
Glucose-Capillary: 110 mg/dL — ABNORMAL HIGH (ref 70–99)

## 2012-06-05 NOTE — Progress Notes (Addendum)
Patient ID: Raymond Rangel, male   DOB: 12-24-1931, 79 y.o.   MRN: [id number]  Subjective/Complaints: 79 y.o. right-handed male history of TIA, diabetes mellitus and dementia. 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 Admitted 05/26/2012 with right-sided weakness and slurred speech. MRI shows acute left PCA territory infarcts without hemorrhage. MRA of the head with 50% mid basilar stenosis. Proximal occlusion left PCA. Echocardiogram with ejection fraction 60% and grade 1 diastolic dysfunction. Carotid Dopplers with no ICA stenosis. Patient did not receive TPA. Neurology consulted patient maintained on aspirin 81 mg daily as prior to admission and Plavix added to regimen. Subcutaneous Lovenox for DVT prophylaxis. Currently patient is maintained on a dysphagia 1 thin liquid diet after modified barium swallow study 05/28/2012.Monitoring of cognition with noted history of dementia placed on Seroquel. No BM x 3 days Incont B and B per RN  SLP notes pt tries to use paretic R hand for feeding  No complaints. Pleasantly confused.Supervision with meals Review of Systems  Review of Systems  Constitutional: Negative.   Respiratory: Positive for cough. Negative for hemoptysis, sputum production, shortness of breath and wheezing.        With meals  Gastrointestinal: Negative.   Neurological: Positive for focal weakness.  All other systems reviewed and are negative.    Objective: Vital Signs: Blood pressure 120/82, pulse 84, temperature 97.4 F (36.3 C), temperature source Oral, resp. rate 20, height 6\' 2"  (1.88 m), weight 102.6 kg (226 lb 3.1 oz), SpO2 97.00%. Dg Swallowing Func-speech Pathology  05/29/2012  [name], CCC-SLP     05/29/2012 11:55 AM   Recommend  to proceed with dysphagia 1 (puree) and thin liquids with full  assist with each meals to ensure safety.  Recommend puree mainly  due to severity of oral dysphagia.  Please note patient required  hand over hand  assist for self feeding with majority of PO trials  administered by treating SLP.  Only trace silent aspiration  occurred with nectar thick liquids and x1 with thin liquid but  patient was in optimal upright positioning.       Results for orders placed during the hospital encounter of 05/30/12 (from the past 72 hour(s))  GLUCOSE, CAPILLARY     Status: None   Collection Time    06/02/12  5:21 PM      Result Value Range   Glucose-Capillary 77  70 - 99 mg/dL  GLUCOSE, CAPILLARY     Status: None   Collection Time    06/02/12  9:52 PM      Result Value Range   Glucose-Capillary 77  70 - 99 mg/dL  GLUCOSE, CAPILLARY     Status: Abnormal   Collection Time    06/03/12  7:33 AM      Result Value Range   Glucose-Capillary 101 (*) 70 - 99 mg/dL  GLUCOSE, CAPILLARY     Status: None   Collection Time    06/03/12 12:10 PM      Result Value Range   Glucose-Capillary 94  70 - 99 mg/dL  GLUCOSE, CAPILLARY     Status: None   Collection Time    06/03/12  4:47 PM      Result Value Range   Glucose-Capillary 98  70 - 99 mg/dL  GLUCOSE, CAPILLARY     Status: Abnormal   Collection Time    06/03/12  9:17 PM      Result Value Range   Glucose-Capillary 115 (*) 70 -  99 mg/dL  GLUCOSE, CAPILLARY     Status: Abnormal   Collection Time    06/04/12  7:23 AM      Result Value Range   Glucose-Capillary 104 (*) 70 - 99 mg/dL   Comment 1 Notify RN    GLUCOSE, CAPILLARY     Status: Abnormal   Collection Time    06/04/12 11:20 AM      Result Value Range   Glucose-Capillary 114 (*) 70 - 99 mg/dL   Comment 1 Notify RN    GLUCOSE, CAPILLARY     Status: None   Collection Time    06/04/12  4:41 PM      Result Value Range   Glucose-Capillary 99  70 - 99 mg/dL  GLUCOSE, CAPILLARY     Status: Abnormal   Collection Time    06/04/12  9:11 PM      Result Value Range   Glucose-Capillary 107 (*) 70 - 99 mg/dL  GLUCOSE, CAPILLARY     Status: Abnormal   Collection Time    06/05/12  7:05 AM      Result Value  Range   Glucose-Capillary 103 (*) 70 - 99 mg/dL   Comment 1 Notify RN       HEENT: normal Cardio: RRR Resp: CTA B/L GI: BS positive and Distention Extremity:  Pulses positive and No Edema Skin:   Intact Neuro: Alert/Oriented, Confused but   attentive today. Doesn't realize he's in the hospital or what has happened to him. Flat, Cranial Nerve II-XII normal, Abnormal Sensory Reduced sensation on Right but also has poor attention to testing, Abnormal Motor 2-/5 in RUE and 3-/5 RLE, 4/5 LUE, 3-/5 LLE and Tone:  increased flexor tone RUE, increased extensor tone RLE Musc/Skel:  Other Old scar L antecubital fossa, non tender Gen:  NAD Cognitive: oriented to person and to hospital but not date Righ homonymous hemianopsia persistent  Assessment/Plan: 1. Functional deficits secondary to Left PCA infarct which require 3+ hours per day of interdisciplinary therapy in a comprehensive inpatient rehab setting. Physiatrist is providing close team supervision and 24 hour management of active medical problems listed below. Physiatrist and rehab team continue to assess barriers to discharge/monitor patient progress toward functional and medical goals. FIM: FIM - Bathing Bathing Steps Patient Completed: Chest;Right Arm;Abdomen;Front perineal area Bathing: 2: Max-Patient completes 3-4 93f 10 parts or 25-49%  FIM - Upper Body Dressing/Undressing Upper body dressing/undressing steps patient completed: Thread/unthread left sleeve of pullover shirt/dress;Put head through opening of pull over shirt/dress Upper body dressing/undressing: 3: Mod-Patient completed 50-74% of tasks FIM - Lower Body Dressing/Undressing Lower body dressing/undressing steps patient completed: Thread/unthread left pants leg Lower body dressing/undressing: 1: Total-Patient completed less than 25% of tasks  FIM - Toileting Toileting: 0: Activity did not occur  FIM - Archivist Transfers: 1-Two helpers  FIM - Physiological scientist Devices: Arm rests Bed/Chair Transfer: 3: Supine > Sit: Mod A (lifting assist/Pt. 50-74%/lift 2 legs;2: Bed > Chair or W/C: Max A (lift and lower assist);2: Chair or W/C > Bed: Max A (lift and lower assist)  FIM - Locomotion: Wheelchair Distance: 15 Locomotion: Wheelchair: 1: Travels less than 50 ft with maximal assistance (Pt: 25 - 49%) FIM - Locomotion: Ambulation Locomotion: Ambulation Assistive Devices: Fara Boros Ambulation/Gait Assistance: 1: +2 Total assist Locomotion: Ambulation: 1: Two helpers  Comprehension Comprehension Mode: Auditory Comprehension: 4-Understands basic 75 - 89% of the time/requires cueing 10 - 24% of the time  Expression Expression  Mode: Verbal Expression: 2-Expresses basic 25 - 49% of the time/requires cueing 50 - 75% of the time. Uses single words/gestures.  Social Interaction Social Interaction: 3-Interacts appropriately 50 - 74% of the time - May be physically or verbally inappropriate.  Problem Solving Problem Solving: 1-Solves basic less than 25% of the time - needs direction nearly all the time or does not effectively solve problems and may need a restraint for safety  Memory Memory: 1-Recognizes or recalls less than 25% of the time/requires cueing greater than 75% of the time Medical Problem List and Plan:  1. embolic left PCA infarct  2. DVT Prophylaxis/Anticoagulation: Subcutaneous Lovenox. Monitor platelet counts and any signs of bleeding Hx of Left femoral L popliteal,Left tibial subacute DVT diagnosed January 2013.lovenox 30 bid currently 3. Mood/dementia. Seroquel 25 mg twice a day, Ativan when necessary. Patient displayed baseline cognitive deficits per family. Will expect wax and wane. 4. Neuropsych: This patient is not capable of making decisions on his/her own behalf.  5. Hyperlipidemia. Zocor  6. Diabetes mellitus with peripheral neuropathy. Hemoglobin A1c 6.7. Patient on diet control prior to  admission. Check CBGs a.c. and at bedtime- fair control generally. Cover spikes with SSI for now. 7. Dysphagia. Dysphagia 1 thin liquids. Speech therapy followup. Monitor for any signs of aspiration.  8.  Leukocytosis with UTI--100k E Coli-- on  cipro 9.  Constipation nsg to give sorbitol     LOS (Days) 6 A FACE TO FACE EVALUATION WAS PERFORMED  Lakeyia Surber E 06/05/2012, 8:18 AM

## 2012-06-05 NOTE — Progress Notes (Signed)
Speech Language Pathology Daily Session Note  Patient Details  Name: Raymond Rangel MRN: [id number] Date of Birth: 1932-01-15  Today's Date: 06/05/2012 Time: 7829-5621 Time Calculation (min): 45 min  Short Term Goals: Week 1: SLP Short Term Goal 1 (Week 1): Patient will follow 1 step directions during functional, familiar tasks with mod assist verbal and visual cues. SLP Short Term Goal 2 (Week 1): Patient will demonstrate basic problem solving during functional, familiar tasks with max assist verbal and visual cues. SLP Short Term Goal 3 (Week 1): Patient will sustain attention to basc task for 1 minute with max assist verba, visual and tactile cues. SLP Short Term Goal 4 (Week 1): Patient will consume Dys.1 textures and thin liquid with mod assist verbal, visual and tactile cues with no overt s/s of aspiration.   Skilled Therapeutic Interventions: Skilled ST treatment during breakfast meal for dysphagia, aphasia and cognition.  Pt. required max verbal/visual cues to attend and locate items on right side of tray.  Pt. required min-mod verbal cues to pick up utensil/cup and initiate self feeding and total hand over hand assist for right hand scoop food and utensil to mouth.  Eventually pt. able to hold bowl in left hand and scoop with spoon in right hand with occassional steadying assist.  Pt. demonstrated functional problem solving abilities approximately 50% of the time by initiating rotating plate closer to reach food.  He required verbal max cues for strategies (2 swallows and volitional throat clear)., Minimal s/s aspiration exhibited included delayed throat clear and delayed cough x 1.  Mr. Charlett Nose followed one step commnands with min-supervision assist.  Pt. also given phrase completion and phonemic cues to name itmes on tray.  Pt.'s longest spontaneous utterance during sesison was "how's the weather [place]?"  Pt. making good progress toward goals.   FIM:  Comprehension Comprehension Mode:  Auditory Comprehension: 3-Understands basic 50 - 74% of the time/requires cueing 25 - 50%  of the time Expression Expression Mode: Verbal Expression: 2-Expresses basic 25 - 49% of the time/requires cueing 50 - 75% of the time. Uses single words/gestures. Social Interaction Social Interaction: 4-Interacts appropriately 75 - 89% of the time - Needs redirection for appropriate language or to initiate interaction. Problem Solving Problem Solving: 3-Solves basic 50 - 74% of the time/requires cueing 25 - 49% of the time Memory Memory: 3-Recognizes or recalls 50 - 74% of the time/requires cueing 25 - 49% of the time FIM - Eating Eating Activity: 2: Hand over hand assist;4: Help with picking up utensils;4: Helper occasionally scoops food on utensil;4: Helper occasionally brings food to mouth;5: Needs verbal cues/supervision  Pain Pain Assessment Pain Assessment: No/denies pain Pain Score:   6 Pain Type: Chronic pain Pain Location: Abdomen Pain Orientation: Left Pain Intervention(s): Repositioned  Therapy/Group: Individual Therapy  Royce Macadamia 06/05/2012, 11:27 AM

## 2012-06-05 NOTE — Progress Notes (Signed)
Occupational Therapy Session Note  Patient Details  Name: Raymond Rangel MRN: [id number] Date of Birth: 08-19-1931  Today's Date: 06/05/2012 Time: 1000-1115 Time Calculation (min): 75 min  Short Term Goals: Week 1:  OT Short Term Goal 1 (Week 1): Pt will roll in bed with mod assist to assist caregivers with clothing changes. OT Short Term Goal 2 (Week 1): Pt will sit on EOB with mod assist to engage in grooming tasks. OT Short Term Goal 3 (Week 1): Pt will don shirt with max assist.  Skilled Therapeutic Interventions/Progress Updates:    No c/o pain. Pt seen for BADL retraining of B/D at sink level.  He was having more difficulty today standing at the sink in that he was leaning to the right and needed max assist to lean to the left.  Pt did ask to use the bathroom and was able to transfer to toilet with max assist with grab bar in squat pivot. He did have a bowel movement, but was unable to come into a stand from toilet for clothing management. He transferred to w/c with total assist. He did assist pushing up from toilet but resisted shifting his hips to w/c and therefore needed total assist. His daughter arrived at this point and assisted with clothing management as he stood at sink with total assist.  She stated that he was fully independent with dressing and toileting, and supervision with bathing prior to CVA.  Discussed with her that he will most likely need mod assist at D/c due to perceptual, tone, and coordination deficits.  Therapy Documentation Precautions:  Precautions Precautions: Fall Precaution Comments: increased tone right side (extensor in leg, flexor in arm); dementai Restrictions Weight Bearing Restrictions: No  ADL:  See FIM for current functional status  Therapy/Group: Individual Therapy  SAGUIER,JULIA 06/05/2012, 11:17 AM

## 2012-06-05 NOTE — Progress Notes (Addendum)
Physical Therapy Session Note  Patient Details  Name: Raymond Rangel MRN: [id number] Date of Birth: [birth year]  Today's Date: 06/05/2012 Time: 0902-1000 and 7829-5621 Time Calculation (min): 58 min and 40 min  Short Term Goals: Week 1:  PT Short Term Goal 1 (Week 1): Pt will perform bed mobility with mod assist. PT Short Term Goal 2 (Week 1): Pt will transfer bed><w/c to L with mod assist consistently. PT Short Term Goal 3 (Week 1): Pt will perform gait x 25' with +2 assist. PT Short Term Goal 4 (Week 1): Pt will stand x 2 minutes during functional task with mod assist.  Skilled Therapeutic Interventions/Progress Updates:   AM- Bed mobility for diaper change with tactile cues, VCs.  Scooting L on bed with min assist, using RUE appropriately 50% of time.  Supine> sit with min A, VCs, blocking R foot from sliding. Bed> w/c squat pivot transfer to L with max assist for wt shift, blocking RLE, movement of hips to L.  neuromuscular re-education via forced use, manual cues, VCs for: -gait x 19' with +2 assist, no AD.  Assistance needed for wt shift to L, advancement of RLE.  Pt unable to isolate R hip or knee flexion in standing to initiate swing phase. -sit>< stand at table for UE functional task, x 20 seconds before pt sat down suddenly, stating he needed to use BR.  Therapist checked pt's brief in room, but it was dry, and pt no longer wanted to use toilet.  PM- Daughters in attendance in pt's room.  Pt appeared very lethargic, sitting up in w/c.  Daughters stated that PTA, pt would return from adult day care at 1PM, and either go to bed or lie in recliner until dinnertime.   Pt stated he needed to use BR. Pt unable to (cognitively) use urinal from w/c.  +2 assist w/c> toilet for wt shifting, RLE blocking and foot placement , using wall bar; total assist for clothing mgt including changing diaper.  Pt did not urinate while on toilet, but brief was wet.    neuromuscular re-education via forced  use, VCs, manual cues, mirror feedback, for: -Sit>< stand from w/c, +2 assist for wt shift forward, RUE safety, bil foot placement -standing at sink for hand hygiene, +2 assist for safety, midline orientation, max assist/ hand over hand assist -standing x 1 minute 30 seconds during LUE task of sorting tools.  Pt was very fatigued, needed hand over hand assist and max cues to attend; unable to sort screwdrivers from wrenches. -w/c> bed stand pivot transfer to R, +2 assist for wt shift, pivot, max cues for head turn to visualize bed  Family ed regarding pt's R neglect, perceptual problems, cognitive problems due to CVA.      Therapy Documentation Precautions:  Precautions Precautions: Fall Precaution Comments: increased tone right side (extensor in leg, flexor in arm); dementia Restrictions Weight Bearing Restrictions: No   Locomotion : Ambulation Ambulation/Gait Assistance: 1: +2 Total assist     See FIM for current functional status  Therapy/Group: Individual Therapy  Blayne Garlick 06/05/2012, 3:00 PM

## 2012-06-06 ENCOUNTER — Inpatient Hospital Stay (HOSPITAL_COMMUNITY): Payer: Medicare Other | Admitting: Occupational Therapy

## 2012-06-06 ENCOUNTER — Inpatient Hospital Stay (HOSPITAL_COMMUNITY): Payer: Medicare Other | Admitting: Speech Pathology

## 2012-06-06 ENCOUNTER — Inpatient Hospital Stay (HOSPITAL_COMMUNITY): Payer: Medicare Other

## 2012-06-06 LAB — GLUCOSE, CAPILLARY
Glucose-Capillary: 105 mg/dL — ABNORMAL HIGH (ref 70–99)
Glucose-Capillary: 98 mg/dL (ref 70–99)

## 2012-06-06 LAB — CREATININE, SERUM: GFR calc non Af Amer: 81 mL/min — ABNORMAL LOW (ref >90)

## 2012-06-06 MED ORDER — TIZANIDINE HCL 2 MG PO TABS
2.0000 mg | ORAL_TABLET | Freq: Three times a day (TID) | ORAL | Status: DC
  Administered 2012-06-06 (×2): 2 mg via ORAL
  Filled 2012-06-06 (×6): qty 1

## 2012-06-06 MED ORDER — QUETIAPINE FUMARATE 25 MG PO TABS
25.0000 mg | ORAL_TABLET | Freq: Once | ORAL | Status: AC
  Administered 2012-06-06: 25 mg via ORAL
  Filled 2012-06-06: qty 1

## 2012-06-06 NOTE — Progress Notes (Signed)
Physical Therapy Session Note  Patient Details  Name: Raymond Rangel MRN: [id number] Date of Birth: 1932/03/06  Today's Date: 06/06/2012 Time:0802-0900, 1130-1200 and  4132-4401 Time Calculation (min): 58, 30 and 30 min  Short Term Goals: Week 1:  PT Short Term Goal 1 (Week 1): Pt will perform bed mobility with mod assist. PT Short Term Goal 2 (Week 1): Pt will transfer bed><w/c to L with mod assist consistently. PT Short Term Goal 3 (Week 1): Pt will perform gait x 25' with +2 assist. PT Short Term Goal 4 (Week 1): Pt will stand x 2 minutes during functional task with mod assist.  Skilled Therapeutic Interventions/Progress Updates:  1st tx- Bed mobillty in flat bed for rolling, using bed rails, mod assist for changing diaper.  Mod assist supine> sit with mod assist.  Bed> w/c to L squat pivot total assist.  PROM bil heel cords, R hamstrings.  neuromuscular re-education via forced use, VCs, manual cues for: -gait x 30' x 2 with +2 assist, using rail on L in hallway, pt performing 50%, but needing a person to bring w/c behind pt.  Focus on wt shift to L to unweight RLE , pt successfully progressing RLE forward 50% of steps.  R knee held rigid and R toes clawing due to contact with floor. -Kinetron in sitting for marching, isolating R hip and knee flexion/extension, x 4 minutes x 2 at 60 cm/sec  2nd tx- neuromuscular re-education as above for: -Gait in hallway as above, mod/max assist of 1 x 30' forward and 15' backward, focusing on wt shift to L in order to advance RLE.  RLE + support reaction continues to limit pt's ability to progress RLE. -w/c> Nu Step transfer stand pivot with max assist, to R, squat pivot to L with mod/max assist -Nu Step for R and L LE rapid alternating movements, and trunk rotation; R hand in strap to hold it on hand grip, at level 3, rated 12 on Borg scale, x 8 minutes with rest PRN   3rd tx- neuromuscular re-education: - Gait in hallway as above, x 30'  forward and 20' backward, with mod/max assist for wt shifting, RLE forward progress, upright trunk and forward gaze    Therapy Documentation Precautions:  Precautions Precautions: Fall Precaution Comments: increased tone right side (extensor in leg, flexor in arm); dementai Restrictions Weight Bearing Restrictions: No Pain: Pain Assessment Pain Assessment: No/denies pain       See FIM for current functional status  Therapy/Group: Individual Therapy  Raymond Rangel 06/06/2012, 3:21 PM

## 2012-06-06 NOTE — Progress Notes (Signed)
Occupational Therapy Session Note  Patient Details  Name: Raymond Rangel MRN: [id number] Date of Birth: [birth year]  Today's Date: 06/06/2012 Time: 1000-1100 Time Calculation (min): 60 min  Short Term Goals: Week 1:  OT Short Term Goal 1 (Week 1): Pt will roll in bed with mod assist to assist caregivers with clothing changes. OT Short Term Goal 2 (Week 1): Pt will sit on EOB with mod assist to engage in grooming tasks. OT Short Term Goal 3 (Week 1): Pt will don shirt with max assist.     Skilled Therapeutic Interventions/Progress Updates:      Pt seen for BADL retraining of  bathing and dressing with a focus on initiation, right side awareness, sit to stand at the sink, static standing at the sink.  Pt was able to stand to sink with mod- max to stand up but only mod assist to stabilize balance with LUE support on sink.  RUE weight bearing on sink with mod assist to stabilize hand.  In gym, pt worked on [employer] motor control with a/arom exercises including towel slides and light weight dowel.  Pt required cues to visually focus on right hand.  Very cooperative during therapy.  Therapy Documentation Precautions:  Precautions Precautions: Fall Precaution Comments: increased tone right side (extensor in leg, flexor in arm); dementai Restrictions Weight Bearing Restrictions: No  Pain: Pain Assessment Pain Assessment: No/denies pain ADL:  See FIM for current functional status  Therapy/Group: Individual Therapy  Giselle Brutus 06/06/2012, 11:21 AM

## 2012-06-06 NOTE — Patient Care Conference (Signed)
Inpatient RehabilitationTeam Conference and Plan of Care Update Date: 06/06/2012   Time: 11:15 AM    Patient Name: Raymond Rangel      Medical Record Number: [id number]  Date of Birth: December 31, 1931 Sex: Male         Room/Bed: 4037/4037-01 Payor Info: Payor: Advertising copywriter MEDICARE  Plan: AARP MEDICARE COMPLETE  Product Type: *No Product type*     Admitting Diagnosis: LT CVA  Admit Date/Time:  05/30/2012  5:01 PM Admission Comments: No comment available   Primary Diagnosis:  Heat stroke Principal Problem: Heat stroke  Patient Active Problem List   Diagnosis Date Noted  . Heat stroke 05/31/2012  . Homonymous hemianopsia due to recent stroke 05/31/2012  . CVA (cerebral infarction) 05/26/2012  . Diabetes mellitus, type 2 07/29/2011  . Right shoulder pain 05/02/2011  . DVT (deep venous thrombosis) 05/02/2011  . CAD (coronary artery disease) 05/02/2011  . Hyperlipidemia 05/02/2011  . Dementia 05/02/2011    Expected Discharge Date: Expected Discharge Date: 06/20/12  Team Members Present: Physician leading conference: Dr. Claudette Laws Social Worker Present: Dossie Der, LCSW Nurse Present: Rosalio Macadamia, RN PT Present: Edman Circle, PT;Caroline Adriana Simas, PT;Other (comment) Clarisse Gouge Ripa-PT) OT Present: Bretta Bang, Verlene Mayer, OT SLP Present: Fae Pippin, SLP Other (Discipline and Name): Charolette Child Coordinator     Current Status/Progress Goal Weekly Team Focus  Medical   Excessive tone right upper and right lower extremity, chronic dementia  Improve right sided muscle tone to improve  sitting balance as well as mobility  Trial of medications   Bowel/Bladder   incont of bowel and bladder  continent of B+B  timed toileting during the day and condom cath at [facility]   Swallow/Nutrition/ Hydration   Dys.1 textures and thin liquids with full staff supervision   least restrictive p.o. intake  trials of Dys.2 textures   ADL's   mod assist UB dressing, max bathing, total LB  dressing; ADL transfers max assist, apraxia, right neglect  mod assist with self care and transfers  ADL retraining and functional mobility   Mobility   mod assist bed mobility, max assist squat pivot transfer, +2 gait x 87'  moderate assist basic transfers and gait x 50' and up/down 3 steps  activity tolerance, transfers, neuro re=ed, balance, midline orientation, family ed, gait   Communication   mod assist   min assist   increase initiation and word finding strategies   Safety/Cognition/ Behavioral Observations  max assist  mod assist  increase carryover of routine/daily information; family education   Pain   n/a  n/a  n/a   Skin   n/a  n/a  n/a      *See Interdisciplinary Assessment and Plan and progress notes for long and short-term goals  Barriers to Discharge: Lethargy with medications    Possible Resolutions to Barriers:  Trial of other anti-spasticity medications    Discharge Planning/Teaching Needs:  Daughter plans to take a FMLA but unsure if she can do the amount of phsycial care pt will require and for a longer period of time than 2-3 months.  Discuss with daughter options  family will need education re CBG monitoring if continued after d/c; will need educ on medication PTD   Team Discussion:  More alert and trying to use right hand.  UTI-reated. 3-4second attention span.  Family has been here to observe in therapies.  Unsure if realize how much assistance will require at discharge, very hopeful  Revisions to Treatment Plan:  Build in rest  breaks after lunch   Continued Need for Acute Rehabilitation Level of Care: The patient requires daily medical management by a physician with specialized training in physical medicine and rehabilitation for the following conditions: Daily direction of a multidisciplinary physical rehabilitation program to ensure safe treatment while eliciting the highest outcome that is of practical value to the patient.: Yes Daily medical management  of patient stability for increased activity during participation in an intensive rehabilitation regime.: Yes Daily analysis of laboratory values and/or radiology reports with any subsequent need for medication adjustment of medical intervention for : Neurological problems;Other  Dilia Alemany, Lemar Livings 06/07/2012, 11:20 AM

## 2012-06-06 NOTE — Progress Notes (Signed)
Social Work Patient ID: Raymond Rangel, male   DOB: 1932-02-12, 79 y.o.   MRN: [id number] Spoke with daughter-Raymond Rangel to inform team conference goals-mod w/c level and discharge 3/12.  Discussed the amount of car ept will require at home and Team question's if can be managed at home at this level.  She reports how much she has been here and tried to help in therapies.  Her goal is to get him home and Feels being in a familiar environment will help his dementia.  She states: " We will do what we need to do, no matter if it is hard on [place]."  Will work with family on a safe Discharge plan, they have high expectations of pt and his progress while here.  Daughter plans to be here daily during therapies.  Discussed briefly the alternative option of SNF.  She wants to avoid this option and wants here Daddy home.

## 2012-06-06 NOTE — Progress Notes (Signed)
NUTRITION FOLLOW UP  DOCUMENTATION CODES  Per approved criteria   -Non-severe malnutrition in the context of chronic illness    Intervention:   1. Continue Ensure Complete po BID, each supplement provides 350 kcal and 13 grams of protein. 2. Please enter correct weight and height on patient 3. RD to continue to follow nutrition care plan  Nutrition Dx:   Inadequate oral intake related to decreased appetite as evidenced by meal completion 50%.  Improved.  Goal:   Pt to meet >/= 90% of their estimated nutrition needs. Met.  Monitor:   PO intake, supplement acceptance, weight  Assessment:   Intake improved since admission, currently eating 80 - 100%. Noted current height and weight is significantly different from previous height and weight. Discussed with RN, she notes that previous height and weight was accurate. Taking Ensure Complete.  Height: Ht Readings from Last 1 Encounters:  05/31/12 6\' 2"  (1.88 m)    Weight Status:   Wt Readings from Last 1 Encounters:  05/31/12 226 lb 3.1 oz (102.6 kg)   Re-estimated needs:  Kcal: 1600 - 1800 Protein: 75 - 85 grams Fluid: at least 1.6 liters daily  Skin: intact  Diet Order: Dysphagia 1 with thin liquids   Intake/Output Summary (Last 24 hours) at 06/06/12 1007 Last data filed at 06/06/12 0900  Gross per 24 hour  Intake    600 ml  Output    451 ml  Net    149 ml    Last BM: 2/22   Labs:   Recent Labs Lab 05/30/12 1750 05/31/12 0635 06/06/12 0620  NA  --  139  --   K  --  3.6  --   CL  --  102  --   CO2  --  24  --   BUN  --  8  --   CREATININE 0.75 0.91 0.87  CALCIUM  --  9.1  --   GLUCOSE  --  102*  --     CBG (last 3)   Recent Labs  06/05/12 1651 06/05/12 2121 06/06/12 0703  GLUCAP 102* 121* 105*    Scheduled Meds: . aspirin  81 mg Oral Daily  . ciprofloxacin  250 mg Oral BID  . clopidogrel  75 mg Oral Q breakfast  . enoxaparin (LOVENOX) injection  30 mg Subcutaneous Q12H  . feeding  supplement  237 mL Oral BID BM  . QUEtiapine  25 mg Oral BID  . simvastatin  40 mg Oral QHS    Continuous Infusions:  none  Jarold Motto MS, RD, LDN Pager: [phone] After-hours pager: [phone]

## 2012-06-06 NOTE — Progress Notes (Signed)
Patient ID: Raymond Rangel, male   DOB: 30-Apr-1931, 79 y.o.   MRN: [id number]  Subjective/Complaints: 79 y.o. right-handed male history of TIA, diabetes mellitus and dementia. 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 Admitted 05/26/2012 with right-sided weakness and slurred speech. MRI shows acute left PCA territory infarcts without hemorrhage. MRA of the head with 50% mid basilar stenosis. Proximal occlusion left PCA. Echocardiogram with ejection fraction 60% and grade 1 diastolic dysfunction. Carotid Dopplers with no ICA stenosis. Patient did not receive TPA. Neurology consulted patient maintained on aspirin 81 mg daily as prior to admission and Plavix added to regimen. Subcutaneous Lovenox for DVT prophylaxis. Currently patient is maintained on a dysphagia 1 thin liquid diet after modified barium swallow study 05/28/2012.Monitoring of cognition with noted history of dementia placed on Seroquel. No BM x 3 days Incont B and B per RN  SLP notes no cough with meal, good intake  No complaints. Pleasantly confused.Supervision with meals Review of Systems  Review of Systems  Constitutional: Negative.   Respiratory: Positive for cough. Negative for hemoptysis, sputum production, shortness of breath and wheezing.        With meals  Gastrointestinal: Negative.   Neurological: Positive for focal weakness.  All other systems reviewed and are negative.    Objective: Vital Signs: Blood pressure 105/68, pulse 82, temperature 98.1 F (36.7 C), temperature source Oral, resp. rate 17, height 6\' 2"  (1.88 m), weight 102.6 kg (226 lb 3.1 oz), SpO2 96.00%. Dg Swallowing Func-speech Pathology  05/29/2012  [name], CCC-SLP     05/29/2012 11:55 AM   Recommend  to proceed with dysphagia 1 (puree) and thin liquids with full  assist with each meals to ensure safety.  Recommend puree mainly  due to severity of oral dysphagia.  Please note patient required  hand over hand assist for self  feeding with majority of PO trials  administered by treating SLP.  Only trace silent aspiration  occurred with nectar thick liquids and x1 with thin liquid but  patient was in optimal upright positioning.       Results for orders placed during the hospital encounter of 05/30/12 (from the past 72 hour(s))  GLUCOSE, CAPILLARY     Status: None   Collection Time    06/03/12 12:10 PM      Result Value Range   Glucose-Capillary 94  70 - 99 mg/dL  GLUCOSE, CAPILLARY     Status: None   Collection Time    06/03/12  4:47 PM      Result Value Range   Glucose-Capillary 98  70 - 99 mg/dL  GLUCOSE, CAPILLARY     Status: Abnormal   Collection Time    06/03/12  9:17 PM      Result Value Range   Glucose-Capillary 115 (*) 70 - 99 mg/dL  GLUCOSE, CAPILLARY     Status: Abnormal   Collection Time    06/04/12  7:23 AM      Result Value Range   Glucose-Capillary 104 (*) 70 - 99 mg/dL   Comment 1 Notify RN    GLUCOSE, CAPILLARY     Status: Abnormal   Collection Time    06/04/12 11:20 AM      Result Value Range   Glucose-Capillary 114 (*) 70 - 99 mg/dL   Comment 1 Notify RN    GLUCOSE, CAPILLARY     Status: None   Collection Time    06/04/12  4:41 PM  Result Value Range   Glucose-Capillary 99  70 - 99 mg/dL  GLUCOSE, CAPILLARY     Status: Abnormal   Collection Time    06/04/12  9:11 PM      Result Value Range   Glucose-Capillary 107 (*) 70 - 99 mg/dL  GLUCOSE, CAPILLARY     Status: Abnormal   Collection Time    06/05/12  7:05 AM      Result Value Range   Glucose-Capillary 103 (*) 70 - 99 mg/dL   Comment 1 Notify RN    GLUCOSE, CAPILLARY     Status: Abnormal   Collection Time    06/05/12 11:22 AM      Result Value Range   Glucose-Capillary 110 (*) 70 - 99 mg/dL   Comment 1 Notify RN    GLUCOSE, CAPILLARY     Status: Abnormal   Collection Time    06/05/12  4:51 PM      Result Value Range   Glucose-Capillary 102 (*) 70 - 99 mg/dL  GLUCOSE, CAPILLARY     Status: Abnormal   Collection  Time    06/05/12  9:21 PM      Result Value Range   Glucose-Capillary 121 (*) 70 - 99 mg/dL  CREATININE, SERUM     Status: Abnormal   Collection Time    06/06/12  6:20 AM      Result Value Range   Creatinine, Ser 0.87  0.50 - 1.35 mg/dL   GFR calc non Af Amer 81 (*) >90 mL/min   GFR calc Af Amer >90  >90 mL/min   Comment:            The eGFR has been calculated     using the CKD EPI equation.     This calculation has not been     validated in all clinical     situations.     eGFR's persistently     <90 mL/min signify     possible Chronic Kidney Disease.  GLUCOSE, CAPILLARY     Status: Abnormal   Collection Time    06/06/12  7:03 AM      Result Value Range   Glucose-Capillary 105 (*) 70 - 99 mg/dL   Comment 1 Notify RN       HEENT: normal Cardio: RRR Resp: CTA B/L GI: BS positive and Distention Extremity:  Pulses positive and No Edema Skin:   Intact Neuro: Alert/Oriented, Confused but   attentive today. Doesn't realize he's in the hospital or what has happened to him. Flat, Cranial Nerve II-XII normal, Abnormal Sensory Reduced sensation on Right but also has poor attention to testing, Abnormal Motor 2-/5 in RUE and 3-/5 RLE, 4/5 LUE, 3-/5 LLE and Tone:  increased flexor tone RUE, increased extensor tone RLE Musc/Skel:  Other Old scar L antecubital fossa, non tender Gen:  NAD Cognitive: oriented to person and to hospital but not date Righ homonymous hemianopsia persistent  Assessment/Plan: 1. Functional deficits secondary to Left PCA infarct which require 3+ hours per day of interdisciplinary therapy in a comprehensive inpatient rehab setting. Physiatrist is providing close team supervision and 24 hour management of active medical problems listed below. Physiatrist and rehab team continue to assess barriers to discharge/monitor patient progress toward functional and medical goals. FIM: FIM - Bathing Bathing Steps Patient Completed: Chest;Right Arm;Abdomen;Front perineal  area Bathing: 2: Max-Patient completes 3-4 32f 10 parts or 25-49%  FIM - Upper Body Dressing/Undressing Upper body dressing/undressing steps patient completed: Thread/unthread left sleeve of  pullover shirt/dress;Put head through opening of pull over shirt/dress Upper body dressing/undressing: 3: Mod-Patient completed 50-74% of tasks FIM - Lower Body Dressing/Undressing Lower body dressing/undressing steps patient completed: Thread/unthread left pants leg Lower body dressing/undressing: 1: Total-Patient completed less than 25% of tasks  FIM - Toileting Toileting: 1: Two helpers  FIM - Diplomatic Services operational officer Devices: Therapist, music Transfers: 1-Two helpers  FIM - Banker Devices: Arm rests Bed/Chair Transfer: 3: Supine > Sit: Mod A (lifting assist/Pt. 50-74%/lift 2 legs;2: Bed > Chair or W/C: Max A (lift and lower assist)  FIM - Locomotion: Wheelchair Distance: 15 Locomotion: Wheelchair: 1: Total Assistance/staff pushes wheelchair (Pt<25%) FIM - Locomotion: Ambulation Locomotion: Ambulation Assistive Devices:  (no AD) Ambulation/Gait Assistance: 1: +2 Total assist Locomotion: Ambulation: 1: Two helpers  Comprehension Comprehension Mode: Auditory Comprehension: 3-Understands basic 50 - 74% of the time/requires cueing 25 - 50%  of the time  Expression Expression Mode: Verbal Expression: 2-Expresses basic 25 - 49% of the time/requires cueing 50 - 75% of the time. Uses single words/gestures.  Social Interaction Social Interaction: 4-Interacts appropriately 75 - 89% of the time - Needs redirection for appropriate language or to initiate interaction.  Problem Solving Problem Solving: 3-Solves basic 50 - 74% of the time/requires cueing 25 - 49% of the time  Memory Memory Mode: Not assessed Memory: 3-Recognizes or recalls 50 - 74% of the time/requires cueing 25 - 49% of the time Medical Problem List and Plan:  1.  embolic left PCA infarct  2. DVT Prophylaxis/Anticoagulation: Subcutaneous Lovenox. Monitor platelet counts and any signs of bleeding Hx of Left femoral L popliteal,Left tibial subacute DVT diagnosed January 2013.lovenox 30 bid currently 3. Mood/dementia. Seroquel 25 mg twice a day, Ativan when necessary. Patient displayed baseline cognitive deficits per family. Will expect wax and wane. 4. Neuropsych: This patient is not capable of making decisions on his/her own behalf.  5. Hyperlipidemia. Zocor  6. Diabetes mellitus with peripheral neuropathy. Hemoglobin A1c 6.7. Patient on diet control prior to admission. Check CBGs a.c. and at bedtime- fair control generally. Cover spikes with SSI for now. 7. Dysphagia. Dysphagia 1 thin liquids. Speech therapy followup. Monitor for any signs of aspiration.  8.  Leukocytosis with UTI--100k E Coli-- on  cipro day 5/7 9.  Constipation nsg to give sorbitol     LOS (Days) 7 A FACE TO FACE EVALUATION WAS PERFORMED  KIRSTEINS,ANDREW E 06/06/2012, 9:45 AM

## 2012-06-06 NOTE — Progress Notes (Signed)
Speech Language Pathology Daily Session Note  Patient Details  Name: Raymond Rangel MRN: [id number] Date of Birth: [birth year]  Today's Date: 06/06/2012 Time: 0920-0950 Time Calculation (min): 30 min  Short Term Goals: Week 1: SLP Short Term Goal 1 (Week 1): Patient will follow 1 step directions during functional, familiar tasks with mod assist verbal and visual cues. SLP Short Term Goal 2 (Week 1): Patient will demonstrate basic problem solving during functional, familiar tasks with max assist verbal and visual cues. SLP Short Term Goal 3 (Week 1): Patient will sustain attention to basc task for 1 minute with max assist verba, visual and tactile cues. SLP Short Term Goal 4 (Week 1): Patient will consume Dys.1 textures and thin liquid with mod assist verbal, visual and tactile cues with no overt s/s of aspiration.   Skilled Therapeutic Interventions: Skilled treatment session focused on safely self-feeding breakfast.  SLP facilitated session with max verbal/visual cues to attend and locate items on right side of tray; mod-max assist verbal/visual cues to problem solve self-feeding.  He required max faded to mod assist verbal cues to utilize safe swallow strategies (2 swallows and volitional throat clear).  Patient displayed no overt s/s of aspiration during session.     FIM:  Comprehension Comprehension Mode: Auditory Comprehension: 3-Understands basic 50 - 74% of the time/requires cueing 25 - 50%  of the time Expression Expression Mode: Verbal Expression: 2-Expresses basic 25 - 49% of the time/requires cueing 50 - 75% of the time. Uses single words/gestures. Social Interaction Social Interaction: 4-Interacts appropriately 75 - 89% of the time - Needs redirection for appropriate language or to initiate interaction. Problem Solving Problem Solving: 2-Solves basic 25 - 49% of the time - needs direction more than half the time to initiate, plan or complete simple activities Memory Memory:  1-Recognizes or recalls less than 25% of the time/requires cueing greater than 75% of the time FIM - Eating Eating Activity: 4: Help with managing cup/glass;4: Help with picking up utensils;4: Helper occasionally scoops food on utensil;4: Helper checks for pocketed food  Pain Pain Assessment Pain Assessment: No/denies pain  Therapy/Group: Individual Therapy  Charlane Ferretti., CCC-SLP [phone]  Raymond Rangel 06/06/2012, 1:00 PM

## 2012-06-07 ENCOUNTER — Inpatient Hospital Stay (HOSPITAL_COMMUNITY): Payer: Medicare Other | Admitting: Speech Pathology

## 2012-06-07 ENCOUNTER — Inpatient Hospital Stay (HOSPITAL_COMMUNITY): Payer: Medicare Other

## 2012-06-07 ENCOUNTER — Inpatient Hospital Stay (HOSPITAL_COMMUNITY): Payer: Medicare Other | Admitting: Occupational Therapy

## 2012-06-07 DIAGNOSIS — R209 Unspecified disturbances of skin sensation: Secondary | ICD-10-CM

## 2012-06-07 DIAGNOSIS — G8110 Spastic hemiplegia affecting unspecified side: Secondary | ICD-10-CM

## 2012-06-07 DIAGNOSIS — I69998 Other sequelae following unspecified cerebrovascular disease: Secondary | ICD-10-CM

## 2012-06-07 DIAGNOSIS — I6330 Cerebral infarction due to thrombosis of unspecified cerebral artery: Secondary | ICD-10-CM

## 2012-06-07 LAB — GLUCOSE, CAPILLARY
Glucose-Capillary: 106 mg/dL — ABNORMAL HIGH (ref 70–99)
Glucose-Capillary: 80 mg/dL (ref 70–99)
Glucose-Capillary: 111 mg/dL — ABNORMAL HIGH (ref 70–99)

## 2012-06-07 MED ORDER — BACLOFEN 5 MG HALF TABLET
5.0000 mg | ORAL_TABLET | Freq: Two times a day (BID) | ORAL | Status: DC
  Administered 2012-06-07 – 2012-06-11 (×9): 5 mg via ORAL
  Filled 2012-06-07 (×11): qty 1

## 2012-06-07 NOTE — Progress Notes (Signed)
Occupational Therapy Weekly Progress Note  Patient Details  Name: Puneet Masoner MRN: [id number] Date of Birth: 1931/04/17  Today's Date: 06/07/2012 Time: 4098-1191 Time Calculation (min): 60 min  1:1 No c/o pain. Pt seen today for BADL retraining with B/D at sink level with a focus on sit to stand and static standing at the sink. Pt need to toilet and was able to transfer on and off the toilet with max assist but needs to remain in squat position otherwise he pushes into extension.  Patient has met 2 of 3 short term goals.  He is progressing with his bed mobility but often needs more than mod assist due to increased RLE tone and apraxia. He has also progressed very well with his sitting balance to supervision with static sitting and his initiation and motor planning for UB dressing is progressing.  Patient continues to demonstrate the following deficits: right inattention, right side apraxia with hemiplegia and abnormal tone, cognitive deficits of memory and processing, poor sitting and standing balance and therefore will continue to benefit from skilled OT intervention to enhance overall performance with BADL and Reduce care partner burden.  Patient progressing toward long term goals.  Continue plan of care.  OT Short Term Goals Week 1:  OT Short Term Goal 1 (Week 1): Pt will roll in bed with mod assist to assist caregivers with clothing changes. OT Short Term Goal 1 - Progress (Week 1): Progressing toward goal OT Short Term Goal 2 (Week 1): Pt will sit on EOB with mod assist to engage in grooming tasks. OT Short Term Goal 2 - Progress (Week 1): Met OT Short Term Goal 3 (Week 1): Pt will don shirt with max assist. OT Short Term Goal 3 - Progress (Week 1): Met Week 2:  OT Short Term Goal 1 (Week 2): Pt will consistently transfer to the toilet using a squat pivot transfer with max assist x1. OT Short Term Goal 2 (Week 2): Pt will sit to stand from toilet and maintain standing with mod  assist. OT Short Term Goal 3 (Week 2): Pt will demonstrate improved RUE motor control by bathing left arm with min assist.  Skilled Therapeutic Interventions/Progress Updates: Continue OT 5-7 days a week for 60-90 min with Balance/vestibular training;Cognitive remediation/compensation;Discharge planning;DME/adaptive equipment instruction;Functional mobility training;Neuromuscular re-education;Patient/family education;Self Care/advanced ADL retraining;Therapeutic Activities;Therapeutic Exercise;UE/LE Strength taining/ROM;UE/LE Coordination activities;Visual/perceptual remediation/compensation to maximize his level of independence with BADLs.  Therapy Documentation Precautions:  Precautions Precautions: Fall Precaution Comments: increased tone right side (extensor in leg, flexor in arm); dementai Restrictions Weight Bearing Restrictions: No    ADL:  See FIM for current functional status  Therapy/Group: Individual Therapy  SAGUIER,JULIA 06/07/2012, 11:53 AM

## 2012-06-07 NOTE — Progress Notes (Signed)
Speech Language Pathology Daily Session Note  Patient Details  Name: Raymond Rangel MRN: [id number] Date of Birth: October 09, 1931  Today's Date: 06/07/2012 Time: 1130-1145 Time Calculation (min): 15 min  Short Term Goals: Week 1: SLP Short Term Goal 1 (Week 1): Patient will follow 1 step directions during functional, familiar tasks with mod assist verbal and visual cues. SLP Short Term Goal 2 (Week 1): Patient will demonstrate basic problem solving during functional, familiar tasks with max assist verbal and visual cues. SLP Short Term Goal 3 (Week 1): Patient will sustain attention to basc task for 1 minute with max assist verba, visual and tactile cues. SLP Short Term Goal 4 (Week 1): Patient will consume Dys.1 textures and thin liquid with mod assist verbal, visual and tactile cues with no overt s/s of aspiration.   Skilled Therapeutic Interventions: Pt participated in [employer] with treatment focus on dysphagia and self-feeding goals. SLP facilitated session with max verbal cues to utilize safe swallow strategies of multiple swallows and volitional throat clear and required hand over hand assist to utilize his right UE during self-feeding. Patient displayed no overt s/s of aspiration during the session.    FIM:  Comprehension Comprehension Mode: Auditory Comprehension: 3-Understands basic 50 - 74% of the time/requires cueing 25 - 50%  of the time Expression Expression Mode: Verbal Expression: 2-Expresses basic 25 - 49% of the time/requires cueing 50 - 75% of the time. Uses single words/gestures. Social Interaction Social Interaction: 4-Interacts appropriately 75 - 89% of the time - Needs redirection for appropriate language or to initiate interaction. Problem Solving Problem Solving: 2-Solves basic 25 - 49% of the time - needs direction more than half the time to initiate, plan or complete simple activities Memory Memory: 1-Recognizes or recalls less than 25% of the time/requires cueing  greater than 75% of the time FIM - Eating Eating Activity: 4: Help with picking up utensils;4: Helper occasionally scoops food on utensil  Pain Pain Assessment Pain Assessment: No/denies pain  Therapy/Group: Group Therapy  Judeen Geralds 06/07/2012, 2:41 PM

## 2012-06-07 NOTE — Progress Notes (Signed)
Patient ID: Raymond Rangel, male   DOB: Nov 03, 1931, 79 y.o.   MRN: [id number]  Subjective/Complaints: 79 y.o. right-handed male history of TIA, diabetes mellitus and dementia. 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 Admitted 05/26/2012 with right-sided weakness and slurred speech. MRI shows acute left PCA territory infarcts without hemorrhage. MRA of the head with 50% mid basilar stenosis. Proximal occlusion left PCA. Echocardiogram with ejection fraction 60% and grade 1 diastolic dysfunction. Carotid Dopplers with no ICA stenosis. Patient did not receive TPA. Neurology consulted patient maintained on aspirin 81 mg daily as prior to admission and Plavix added to regimen. Subcutaneous Lovenox for DVT prophylaxis. Currently patient is maintained on a dysphagia 1 thin liquid diet after modified barium swallow study 05/28/2012.Monitoring of cognition with noted history of dementia placed on Seroquel. Tone improved but more lethargic per PT Incont B and B per RN  SLP notes no cough with meal, good intake  No complaints. Pleasantly confused.Supervision with meals Review of Systems  Review of Systems  Constitutional: Negative.   Respiratory: Positive for cough. Negative for hemoptysis, sputum production, shortness of breath and wheezing.        With meals  Gastrointestinal: Negative.   Neurological: Positive for focal weakness.  All other systems reviewed and are negative.    Objective: Vital Signs: Blood pressure 113/65, pulse 77, temperature 97.8 F (36.6 C), temperature source Oral, resp. rate 18, height 6\' 2"  (1.88 m), weight 102.6 kg (226 lb 3.1 oz), SpO2 98.00%. Dg Swallowing Func-speech Pathology  05/29/2012  [name], CCC-SLP     05/29/2012 11:55 AM   Recommend  to proceed with dysphagia 1 (puree) and thin liquids with full  assist with each meals to ensure safety.  Recommend puree mainly  due to severity of oral dysphagia.  Please note patient required  hand  over hand assist for self feeding with majority of PO trials  administered by treating SLP.  Only trace silent aspiration  occurred with nectar thick liquids and x1 with thin liquid but  patient was in optimal upright positioning.       Results for orders placed during the hospital encounter of 05/30/12 (from the past 72 hour(s))  GLUCOSE, CAPILLARY     Status: Abnormal   Collection Time    06/04/12 11:20 AM      Result Value Range   Glucose-Capillary 114 (*) 70 - 99 mg/dL   Comment 1 Notify RN    GLUCOSE, CAPILLARY     Status: None   Collection Time    06/04/12  4:41 PM      Result Value Range   Glucose-Capillary 99  70 - 99 mg/dL  GLUCOSE, CAPILLARY     Status: Abnormal   Collection Time    06/04/12  9:11 PM      Result Value Range   Glucose-Capillary 107 (*) 70 - 99 mg/dL  GLUCOSE, CAPILLARY     Status: Abnormal   Collection Time    06/05/12  7:05 AM      Result Value Range   Glucose-Capillary 103 (*) 70 - 99 mg/dL   Comment 1 Notify RN    GLUCOSE, CAPILLARY     Status: Abnormal   Collection Time    06/05/12 11:22 AM      Result Value Range   Glucose-Capillary 110 (*) 70 - 99 mg/dL   Comment 1 Notify RN    GLUCOSE, CAPILLARY     Status: Abnormal   Collection Time  06/05/12  4:51 PM      Result Value Range   Glucose-Capillary 102 (*) 70 - 99 mg/dL  GLUCOSE, CAPILLARY     Status: Abnormal   Collection Time    06/05/12  9:21 PM      Result Value Range   Glucose-Capillary 121 (*) 70 - 99 mg/dL  CREATININE, SERUM     Status: Abnormal   Collection Time    06/06/12  6:20 AM      Result Value Range   Creatinine, Ser 0.87  0.50 - 1.35 mg/dL   GFR calc non Af Amer 81 (*) >90 mL/min   GFR calc Af Amer >90  >90 mL/min   Comment:            The eGFR has been calculated     using the CKD EPI equation.     This calculation has not been     validated in all clinical     situations.     eGFR's persistently     <90 mL/min signify     possible Chronic Kidney Disease.   GLUCOSE, CAPILLARY     Status: Abnormal   Collection Time    06/06/12  7:03 AM      Result Value Range   Glucose-Capillary 105 (*) 70 - 99 mg/dL   Comment 1 Notify RN    GLUCOSE, CAPILLARY     Status: None   Collection Time    06/06/12 11:29 AM      Result Value Range   Glucose-Capillary 98  70 - 99 mg/dL   Comment 1 Notify RN    GLUCOSE, CAPILLARY     Status: None   Collection Time    06/06/12  4:55 PM      Result Value Range   Glucose-Capillary 93  70 - 99 mg/dL  GLUCOSE, CAPILLARY     Status: Abnormal   Collection Time    06/06/12  9:36 PM      Result Value Range   Glucose-Capillary 121 (*) 70 - 99 mg/dL  GLUCOSE, CAPILLARY     Status: None   Collection Time    06/07/12  7:13 AM      Result Value Range   Glucose-Capillary 93  70 - 99 mg/dL   Comment 1 Notify RN       HEENT: normal Cardio: RRR Resp: CTA B/L GI: BS positive and Distention Extremity:  Pulses positive and No Edema Skin:   Intact Neuro: Alert/Oriented, Confused but   attentive today. Doesn't realize he's in the hospital or what has happened to him. Flat, Cranial Nerve II-XII normal, Abnormal Sensory Reduced sensation on Right but also has poor attention to testing, Abnormal Motor 2-/5 in RUE and 3-/5 RLE, 4/5 LUE, 3-/5 LLE and Tone:  increased flexor tone RUE, increased extensor tone RLE Musc/Skel:  Other Old scar L antecubital fossa, non tender Gen:  NAD Cognitive: oriented to person and to hospital but not date Righ homonymous hemianopsia persistent  Assessment/Plan: 1. Functional deficits secondary to Left PCA infarct which require 3+ hours per day of interdisciplinary therapy in a comprehensive inpatient rehab setting. Physiatrist is providing close team supervision and 24 hour management of active medical problems listed below. Physiatrist and rehab team continue to assess barriers to discharge/monitor patient progress toward functional and medical goals. FIM: FIM - Bathing Bathing Steps Patient  Completed: Chest;Right Arm;Abdomen;Front perineal area Bathing: 2: Max-Patient completes 3-4 69f 10 parts or 25-49%  FIM - Upper Body Dressing/Undressing Upper  body dressing/undressing steps patient completed: Thread/unthread left sleeve of pullover shirt/dress;Put head through opening of pull over shirt/dress Upper body dressing/undressing: 3: Mod-Patient completed 50-74% of tasks FIM - Lower Body Dressing/Undressing Lower body dressing/undressing steps patient completed: Thread/unthread left pants leg Lower body dressing/undressing: 1: Total-Patient completed less than 25% of tasks  FIM - Toileting Toileting: 1: Two helpers  FIM - Diplomatic Services operational officer Devices: Therapist, music Transfers: 1-Two helpers  FIM - Banker Devices: Bed rails Bed/Chair Transfer: 3: Supine > Sit: Mod A (lifting assist/Pt. 50-74%/lift 2 legs;1: Bed > Chair or W/C: Total A (helper does all/Pt. < 25%)  FIM - Locomotion: Wheelchair Distance: 15 Locomotion: Wheelchair: 1: Total Assistance/staff pushes wheelchair (Pt<25%) FIM - Locomotion: Ambulation Locomotion: Ambulation Assistive Devices: Other (comment) (L rail in hallway) Ambulation/Gait Assistance: 1: +2 Total assist Locomotion: Ambulation: 1: Two helpers (pt performed 25-50% of task)  Comprehension Comprehension Mode: Auditory Comprehension: 3-Understands basic 50 - 74% of the time/requires cueing 25 - 50%  of the time  Expression Expression Mode: Verbal Expression: 2-Expresses basic 25 - 49% of the time/requires cueing 50 - 75% of the time. Uses single words/gestures.  Social Interaction Social Interaction: 4-Interacts appropriately 75 - 89% of the time - Needs redirection for appropriate language or to initiate interaction.  Problem Solving Problem Solving Mode: Not assessed Problem Solving: 2-Solves basic 25 - 49% of the time - needs direction more than half the time to initiate,  plan or complete simple activities  Memory Memory Mode: Not assessed Memory: 1-Recognizes or recalls less than 25% of the time/requires cueing greater than 75% of the time Medical Problem List and Plan:  1. embolic left PCA infarct  2. DVT Prophylaxis/Anticoagulation: Subcutaneous Lovenox. Monitor platelet counts and any signs of bleeding Hx of Left femoral L popliteal,Left tibial subacute DVT diagnosed January 2013.lovenox 30 bid currently 3. Mood/dementia. Seroquel 25 mg twice a day, Ativan when necessary. Patient displayed baseline cognitive deficits per family. Will expect wax and wane. 4. Neuropsych: This patient is not capable of making decisions on his/her own behalf.  5. Hyperlipidemia. Zocor  6. Diabetes mellitus with peripheral neuropathy. Hemoglobin A1c 6.7. Patient on diet control prior to admission. Check CBGs a.c. and at bedtime- fair control generally. Cover spikes with SSI for now. 7. Dysphagia. Dysphagia 1 thin liquids. Speech therapy followup. Monitor for any signs of aspiration.  8.  Leukocytosis with UTI--100k E Coli-- on  cipro day 6/7 9.  Constipation nsg to give sorbitol 10.  Spasticity tone improved on Zanaflex but too sedated,try baclofen    LOS (Days) 8 A FACE TO FACE EVALUATION WAS PERFORMED  KIRSTEINS,ANDREW E 06/07/2012, 8:27 AM

## 2012-06-07 NOTE — Progress Notes (Signed)
Occupational Therapy Note  Patient Details  Name: Raymond Rangel MRN: [id number] Date of Birth: Feb 28, 1932 Today's Date: 06/07/2012  [employer] with SLP [phone] no c/o pain Self feeding group with focus on safe swallowing techniques, functional use of right hand for self feeding; trial of using a curved spoon with a built up handle to promote function and independence. Pt required A to scoop food 75% of time and min A 90% of time to bring spoon to mouth with accuracy. Encouragement of PO intake.  [name] [facility] 06/07/2012, 7:39 PM

## 2012-06-07 NOTE — Progress Notes (Signed)
Speech Language Pathology Daily Session Note & Weekly Progress Note  Patient Details  Name: Raymond Rangel MRN: [id number] Date of Birth: [birth year]  Today's Date: 06/07/2012 Time: 1430-1500 Time Calculation (min): 30 min  Short Term Goals: Week 1: SLP Short Term Goal 1 (Week 1): Patient will follow 1 step directions during functional, familiar tasks with mod assist verbal and visual cues. SLP Short Term Goal 2 (Week 1): Patient will demonstrate basic problem solving during functional, familiar tasks with max assist verbal and visual cues. SLP Short Term Goal 3 (Week 1): Patient will sustain attention to basc task for 1 minute with max assist verba, visual and tactile cues. SLP Short Term Goal 4 (Week 1): Patient will consume Dys.1 textures and thin liquid with mod assist verbal, visual and tactile cues with no overt s/s of aspiration.   Skilled Therapeutic Interventions: Skilled treatment session target was dysphagia treatment for trials of upgraded textures; however, patient was asleep in room.  SLP was able to arouse patient but patient was confused and reported being nauseated.  SLP facilitated session with max assist to sit edge of bed and for re-orientation.  Patient consumed thin liquids with no overt s/s of aspiration and refused any Dys.2 or Dys.3 textures.  RN notified regarding report of stomach hurting.   FIM:  Comprehension Comprehension Mode: Auditory Comprehension: 3-Understands basic 50 - 74% of the time/requires cueing 25 - 50%  of the time Expression Expression Mode: Verbal Expression: 2-Expresses basic 25 - 49% of the time/requires cueing 50 - 75% of the time. Uses single words/gestures. Social Interaction Social Interaction: 4-Interacts appropriately 75 - 89% of the time - Needs redirection for appropriate language or to initiate interaction. Problem Solving Problem Solving: 2-Solves basic 25 - 49% of the time - needs direction more than half the time to initiate, plan or  complete simple activities Memory Memory: 1-Recognizes or recalls less than 25% of the time/requires cueing greater than 75% of the time FIM - Eating Eating Activity: 4: Help with picking up utensils;4: Helper occasionally scoops food on utensil  Pain Pain Assessment Pain Assessment: No/denies pain  Therapy/Group: Individual Therapy   Speech Language Pathology Weekly Progress Note  Patient Details  Name: Raymond Rangel MRN: [id number] Date of Birth: 1931-08-23  Today's Date: 06/07/2012  Short Term Goals: Week 1: SLP Short Term Goal 1 (Week 1): Patient will follow 1 step directions during functional, familiar tasks with mod assist verbal and visual cues. SLP Short Term Goal 1 - Progress (Week 1): Progressing toward goal SLP Short Term Goal 2 (Week 1): Patient will demonstrate basic problem solving during functional, familiar tasks with max assist verbal and visual cues. SLP Short Term Goal 2 - Progress (Week 1): Met SLP Short Term Goal 3 (Week 1): Patient will sustain attention to basc task for 1 minute with max assist verba, visual and tactile cues. SLP Short Term Goal 3 - Progress (Week 1): Met SLP Short Term Goal 4 (Week 1): Patient will consume Dys.1 textures and thin liquid with mod assist verbal, visual and tactile cues with no overt s/s of aspiration.  SLP Short Term Goal 4 - Progress (Week 1): Met Week 2: SLP Short Term Goal 1 (Week 2): Patient will follow 1 step directions during functional, familiar tasks with mod assist verbal and visual cues. SLP Short Term Goal 2 (Week 2): Patient will demonstrate basic problem solving during functional, familiar tasks with mod assist verbal and visual cues. SLP Short Term Goal 3 (Week 2): Patient  will consume Dys.1 textures and thin liquid with min assist verbal, visual and tactile cues with no overt s/s of aspiration.   Weekly Progress Updates:  Patient met 3 out of 4 short term goals this reporting period due to gains in sustained  attention, familiar task problem solving and diet toleration.  Patient made progress toward following 1-step directions; however, is not consistent use to fluctuating cognition.  Patient appears to perform better in morning session with less carryover in afternoon sessions.  Patient continues to require significant cues for safety from clinicians; as a result, it is recommended that this patient continue to receive skilled SLP services to maximize functional independence and reduce burden of care upon discharge.     SLP Intensity: Minumum of 1-2 x/day, 30 to 90 minutes SLP Frequency: 5 out of 7 days SLP Duration/Estimated Length of Stay: 2 weeks SLP Treatment/Interventions: Cognitive remediation/compensation;Cueing hierarchy;Dysphagia/aspiration precaution training;Environmental controls;Functional tasks;Internal/external aids;Patient/family education;Speech/Language facilitation;Therapeutic Activities;Therapeutic Exercise  Charlane Ferretti., CCC-SLP [phone]  Stavros Cail 06/07/2012, 4:27 PM

## 2012-06-07 NOTE — Progress Notes (Signed)
Physical Therapy Weekly Progress Note  Patient Details  Name: Raymond Rangel MRN: [id number] Date of Birth: 1932-01-08  Today's Date: 06/07/2012 Time: 1020-1059 Time Calculation (min): 39 min  Patient has met 2 of 4 short term goals.  Due to pt's visual/perceptual problems, he continues to have difficulty with transfers, and standing during a functional task.  Patient continues to demonstrate the following deficits: hypertonus RLE, apraxia RUE and RLE, balance, midline orientation, muscle timing and sequencing, vision/perception, cognition and therefore will continue to benefit from skilled PT intervention to enhance overall performance with activity tolerance, balance, postural control, ability to compensate for deficits, functional use of  right upper extremity and right lower extremity, attention and awareness.  Patient progressing toward long term goals. PT Short Term Goals Week 1:  PT Short Term Goal 1 (Week 1): Pt will perform bed mobility with mod assist. PT Short Term Goal 1 - Progress (Week 1): Met PT Short Term Goal 2 (Week 1): Pt will transfer bed><w/c to L with mod assist consistently. PT Short Term Goal 2 - Progress (Week 1): Not met PT Short Term Goal 3 (Week 1): Pt will perform gait x 25' with +2 assist. PT Short Term Goal 3 - Progress (Week 1): Met PT Short Term Goal 4 (Week 1): Pt will stand x 2 minutes during functional task with mod assist. PT Short Term Goal 4 - Progress (Week 1): Not met   Skilled Therapeutic Interventions/Progress Updates:  1st tx- Pt asleep, difficult to arouse.  Pt on new med for hypertonus; increased lethargy seen this AM. PROM RLE: decreased tone noted , more easily moved especially at ankle.  Daughter Minchi in attendance to observe tx.  Bed mobility with mod assist in flat bed without rails.  Bed> w/c squat pivot with total assist to L. Pt resistant to movement to L.  neuromuscular re-education via forced use, manual cues, VCs for: -gait x  30' x 2 with mod assist for R LE forward progression, wt shifting, a 2nd person to bring w/c behind at end of walk  2nd tx- Pt alert, sitting up in w/c, conversational.  neuromuscular re-education via forced use, manual cues, VCs for: -sit>< stand max assist to wt shift forward and block RLE from mass extension -Gait in hallx 30' forward and 30' backward with L rail as above,  mod/max assist of 1.  Pt with improved wt shifting to L and RLE forward progression this tx; slight R knee flexion noted. -RUE functional use during grasp/release task with mod assist, in sitting, mod cues needed for visual scanning to R, fading to min cues with repetitipn -gait with RW with R hand orthosis, R Ace wrap to break up extensor synergy RLE, x 30' x 15' , +2 assist for wt shifting to L, progressing RLE, progressing RW      Therapy Documentation Precautions:  Precautions Precautions: Fall Precaution Comments: increased tone right side (extensor in leg, flexor in arm); dementai Restrictions Weight Bearing Restrictions: No   Pain: Pain Assessment Pain Assessment: No/denies pain   Locomotion : Ambulation Ambulation/Gait Assistance: 1: +2 Total assist     See FIM for current functional status  Therapy/Group: Individual Therapy  Danell Verno 06/07/2012, 3:17 PM

## 2012-06-08 ENCOUNTER — Inpatient Hospital Stay (HOSPITAL_COMMUNITY): Payer: Medicare Other | Admitting: Occupational Therapy

## 2012-06-08 ENCOUNTER — Inpatient Hospital Stay (HOSPITAL_COMMUNITY): Payer: Medicare Other

## 2012-06-08 ENCOUNTER — Inpatient Hospital Stay (HOSPITAL_COMMUNITY): Payer: Medicare Other | Admitting: Speech Pathology

## 2012-06-08 DIAGNOSIS — I69998 Other sequelae following unspecified cerebrovascular disease: Secondary | ICD-10-CM

## 2012-06-08 DIAGNOSIS — G8110 Spastic hemiplegia affecting unspecified side: Secondary | ICD-10-CM

## 2012-06-08 DIAGNOSIS — I6330 Cerebral infarction due to thrombosis of unspecified cerebral artery: Secondary | ICD-10-CM

## 2012-06-08 DIAGNOSIS — R209 Unspecified disturbances of skin sensation: Secondary | ICD-10-CM

## 2012-06-08 LAB — BASIC METABOLIC PANEL
BUN: 20 mg/dL (ref 6–23)
CO2: 24 mEq/L (ref 19–32)
Chloride: 99 mEq/L (ref 96–112)
Creatinine, Ser: 0.96 mg/dL (ref 0.50–1.35)

## 2012-06-08 LAB — GLUCOSE, CAPILLARY: Glucose-Capillary: 107 mg/dL — ABNORMAL HIGH (ref 70–99)

## 2012-06-08 NOTE — Progress Notes (Signed)
Occupational Therapy Session Note  Patient Details  Name: Raymond Rangel MRN: [id number] Date of Birth: Dec 09, 1931  Today's Date: 06/08/2012 Time: 4098-1191 Time Calculation (min): 60 min  Short Term Goals: Week 1:  OT Short Term Goal 1 (Week 1): Pt will roll in bed with mod assist to assist caregivers with clothing changes. OT Short Term Goal 1 - Progress (Week 1): Progressing toward goal OT Short Term Goal 2 (Week 1): Pt will sit on EOB with mod assist to engage in grooming tasks. OT Short Term Goal 2 - Progress (Week 1): Met OT Short Term Goal 3 (Week 1): Pt will don shirt with max assist. OT Short Term Goal 3 - Progress (Week 1): Met Week 2:  OT Short Term Goal 1 (Week 2): Pt will consistently transfer to the toilet using a squat pivot transfer with max assist x1. OT Short Term Goal 2 (Week 2): Pt will sit to stand from toilet and maintain standing with mod assist. OT Short Term Goal 3 (Week 2): Pt will demonstrate improved RUE motor control by bathing left arm with min assist.  Skilled Therapeutic Interventions/Progress Updates:    Pt seen for family education with daughter for BADL retraining of B/D at bed level to demonstrate alternative options to daughter (versus shower or standing at sink).  This technique can be performed by one helper versus 2 at the sink.  Pt was participating very well and demonstrating improved ability to motor plan and follow directions. He was able to bend both knees on command and bridge his hips for rolling in bed and to don pants over hips. Pt was actually able to use LUE to partially pull pants over his hips.  Pt sat to EOB pushing up with LUE with min to mod assist. He was then able to sit at EOB to don shirt with mod assist. He did lean back and his RUE did move into flexor synergy patterns when he was exerting himself. Today he was a max assist squat pivot to w/c with daughter stabilizing w/c. He needed total assist to maintain RLE in flexion to stabilize leg  on the floor.  Therapy Documentation Precautions:  Precautions Precautions: Fall Precaution Comments: increased tone right side (extensor in leg, flexor in arm); dementai Restrictions Weight Bearing Restrictions: No   Pain: Pain Assessment Pain Assessment: No/denies pain ADL:  See FIM for current functional status  Therapy/Group: Individual Therapy  Tallulah Hosman 06/08/2012, 11:00 AM

## 2012-06-08 NOTE — Progress Notes (Signed)
Occupational Therapy Note  Patient Details  Name: Raymond Rangel MRN: [id number] Date of Birth: November 19, 1931 Today's Date: 06/08/2012  [employer] with SLP [phone] No c/;o pain  Self feeding group with focus on functional use of right hand with curved built up handle to bring food to mouth. Pt needed mod A to grasp the spoon properly and accurately get the food into his mouth. Continued to work on basic cognition and safe swallowing techniques.  [name] [facility] 06/08/2012, 1:17 PM

## 2012-06-08 NOTE — Progress Notes (Signed)
Patient ID: Raymond Rangel, male   DOB: 12-14-1931, 79 y.o.   MRN: [id number]  Subjective/Complaints: 79 y.o. right-handed male history of TIA, diabetes mellitus and dementia. 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 Admitted 05/26/2012 with right-sided weakness and slurred speech. MRI shows acute left PCA territory infarcts without hemorrhage. MRA of the head with 50% mid basilar stenosis. Proximal occlusion left PCA. Echocardiogram with ejection fraction 60% and grade 1 diastolic dysfunction. Carotid Dopplers with no ICA stenosis. Patient did not receive TPA. Neurology consulted patient maintained on aspirin 81 mg daily as prior to admission and Plavix added to regimen. Subcutaneous Lovenox for DVT prophylaxis. Currently patient is maintained on a dysphagia 1 thin liquid diet after modified barium swallow study 05/28/2012.Monitoring of cognition with noted history of dementia placed on Seroquel. Tone improved but more lethargic per PT Incont B and B per RN  SLP notes no cough with meal, good intake  No complaints. Pleasantly confused.Supervision with meals Review of Systems  Review of Systems  Constitutional: Negative.   Respiratory: Positive for cough. Negative for hemoptysis, sputum production, shortness of breath and wheezing.        With meals  Gastrointestinal: Negative.   Neurological: Positive for focal weakness.  All other systems reviewed and are negative.    Objective: Vital Signs: Blood pressure 115/77, pulse 82, temperature 98.1 F (36.7 C), temperature source Oral, resp. rate 17, height 6\' 2"  (1.88 m), weight 102.6 kg (226 lb 3.1 oz), SpO2 97.00%. Dg Swallowing Func-speech Pathology  05/29/2012  [name], CCC-SLP     05/29/2012 11:55 AM   Recommend  to proceed with dysphagia 1 (puree) and thin liquids with full  assist with each meals to ensure safety.  Recommend puree mainly  due to severity of oral dysphagia.  Please note patient required  hand  over hand assist for self feeding with majority of PO trials  administered by treating SLP.  Only trace silent aspiration  occurred with nectar thick liquids and x1 with thin liquid but  patient was in optimal upright positioning.       Results for orders placed during the hospital encounter of 05/30/12 (from the past 72 hour(s))  GLUCOSE, CAPILLARY     Status: Abnormal   Collection Time    06/05/12 11:22 AM      Result Value Range   Glucose-Capillary 110 (*) 70 - 99 mg/dL   Comment 1 Notify RN    GLUCOSE, CAPILLARY     Status: Abnormal   Collection Time    06/05/12  4:51 PM      Result Value Range   Glucose-Capillary 102 (*) 70 - 99 mg/dL  GLUCOSE, CAPILLARY     Status: Abnormal   Collection Time    06/05/12  9:21 PM      Result Value Range   Glucose-Capillary 121 (*) 70 - 99 mg/dL  CREATININE, SERUM     Status: Abnormal   Collection Time    06/06/12  6:20 AM      Result Value Range   Creatinine, Ser 0.87  0.50 - 1.35 mg/dL   GFR calc non Af Amer 81 (*) >90 mL/min   GFR calc Af Amer >90  >90 mL/min   Comment:            The eGFR has been calculated     using the CKD EPI equation.     This calculation has not been     validated in  all clinical     situations.     eGFR's persistently     <90 mL/min signify     possible Chronic Kidney Disease.  GLUCOSE, CAPILLARY     Status: Abnormal   Collection Time    06/06/12  7:03 AM      Result Value Range   Glucose-Capillary 105 (*) 70 - 99 mg/dL   Comment 1 Notify RN    GLUCOSE, CAPILLARY     Status: None   Collection Time    06/06/12 11:29 AM      Result Value Range   Glucose-Capillary 98  70 - 99 mg/dL   Comment 1 Notify RN    GLUCOSE, CAPILLARY     Status: None   Collection Time    06/06/12  4:55 PM      Result Value Range   Glucose-Capillary 93  70 - 99 mg/dL  GLUCOSE, CAPILLARY     Status: Abnormal   Collection Time    06/06/12  9:36 PM      Result Value Range   Glucose-Capillary 121 (*) 70 - 99 mg/dL  GLUCOSE,  CAPILLARY     Status: None   Collection Time    06/07/12  7:13 AM      Result Value Range   Glucose-Capillary 93  70 - 99 mg/dL   Comment 1 Notify RN    GLUCOSE, CAPILLARY     Status: Abnormal   Collection Time    06/07/12 11:17 AM      Result Value Range   Glucose-Capillary 106 (*) 70 - 99 mg/dL   Comment 1 Notify RN    GLUCOSE, CAPILLARY     Status: None   Collection Time    06/07/12  5:15 PM      Result Value Range   Glucose-Capillary 80  70 - 99 mg/dL   Comment 1 Notify RN    GLUCOSE, CAPILLARY     Status: Abnormal   Collection Time    06/07/12  9:18 PM      Result Value Range   Glucose-Capillary 111 (*) 70 - 99 mg/dL   Comment 1 Notify RN    GLUCOSE, CAPILLARY     Status: Abnormal   Collection Time    06/08/12  7:17 AM      Result Value Range   Glucose-Capillary 107 (*) 70 - 99 mg/dL     HEENT: normal Cardio: RRR Resp: CTA B/L GI: BS positive and Distention Extremity:  Pulses positive and No Edema Skin:   Intact Neuro: Alert/Oriented, Confused but   attentive today. Doesn't realize he's in the hospital or what has happened to him. Flat, Cranial Nerve II-XII normal, Abnormal Sensory Reduced sensation on Right but also has poor attention to testing, Abnormal Motor 2-/5 in RUE and 3-/5 RLE, 4/5 LUE, 3-/5 LLE and Tone:  increased flexor tone RUE, increased extensor tone RLE Musc/Skel:  Other Old scar L antecubital fossa, non tender Gen:  NAD Cognitive: oriented to person and to hospital but not date Righ homonymous hemianopsia persistent  Assessment/Plan: 1. Functional deficits secondary to Left PCA infarct which require 3+ hours per day of interdisciplinary therapy in a comprehensive inpatient rehab setting. Physiatrist is providing close team supervision and 24 hour management of active medical problems listed below. Physiatrist and rehab team continue to assess barriers to discharge/monitor patient progress toward functional and medical goals. FIM: FIM -  Bathing Bathing Steps Patient Completed: Chest;Right Arm;Abdomen;Front perineal area Bathing: 2: Max-Patient completes 3-4 68f  10 parts or 25-49%  FIM - Upper Body Dressing/Undressing Upper body dressing/undressing steps patient completed: Thread/unthread left sleeve of pullover shirt/dress Upper body dressing/undressing: 2: Max-Patient completed 25-49% of tasks FIM - Lower Body Dressing/Undressing Lower body dressing/undressing steps patient completed: Thread/unthread left pants leg Lower body dressing/undressing: 1: Total-Patient completed less than 25% of tasks  FIM - Toileting Toileting: 1: Two helpers  FIM - Diplomatic Services operational officer Devices: Grab bars Toilet Transfers: 2-From toilet/BSC: Max A (lift and lower assist);2-To toilet/BSC: Max A (lift and lower assist)  FIM - Press photographer Assistive Devices: Arm rests Bed/Chair Transfer: 3: Supine > Sit: Mod A (lifting assist/Pt. 50-74%/lift 2 legs;1: Bed > Chair or W/C: Total A (helper does all/Pt. < 25%)  FIM - Locomotion: Wheelchair Distance: 15 Locomotion: Wheelchair: 1: Total Assistance/staff pushes wheelchair (Pt<25%) FIM - Locomotion: Ambulation Locomotion: Ambulation Assistive Devices: Walker - Rolling;Orthosis (R hand orthosis) Ambulation/Gait Assistance: 1: +2 Total assist Locomotion: Ambulation: 1: Two helpers (30')  Comprehension Comprehension Mode: Auditory Comprehension: 3-Understands basic 50 - 74% of the time/requires cueing 25 - 50%  of the time  Expression Expression Mode: Verbal Expression: 1-Expresses basis less than 25% of the time/requires cueing greater than 75% of the time.  Social Interaction Social Interaction: 3-Interacts appropriately 50 - 74% of the time - May be physically or verbally inappropriate.  Problem Solving Problem Solving Mode: Not assessed Problem Solving: 2-Solves basic 25 - 49% of the time - needs direction more than half the time to  initiate, plan or complete simple activities  Memory Memory Mode: Not assessed Memory: 1-Recognizes or recalls less than 25% of the time/requires cueing greater than 75% of the time Medical Problem List and Plan:  1. embolic left PCA infarct  2. DVT Prophylaxis/Anticoagulation: Subcutaneous Lovenox. Monitor platelet counts and any signs of bleeding Hx of Left femoral L popliteal,Left tibial subacute DVT diagnosed January 2013.lovenox 30 bid currently 3. Mood/dementia. Seroquel 25 mg twice a day, Ativan when necessary. Patient displayed baseline cognitive deficits per family. Will expect wax and wane. 4. Neuropsych: This patient is not capable of making decisions on his/her own behalf.  5. Hyperlipidemia. Zocor  6. Diabetes mellitus with peripheral neuropathy. Hemoglobin A1c 6.7. Patient on diet control prior to admission. Check CBGs a.c. and at bedtime- fair control generally. Cover spikes with SSI for now. 7. Dysphagia. Dysphagia 1 thin liquids. Speech therapy followup. Monitor for any signs of aspiration.  8.  Leukocytosis with UTI--100k E Coli-- on  cipro day 6/7 9.  Constipation nsg to give sorbitol 10.  Spasticity tone improved on Zanaflex but too sedated,try baclofen, less sedated, monitor tone    LOS (Days) 9 A FACE TO FACE EVALUATION WAS PERFORMED  Ellinor Test E 06/08/2012, 9:43 AM

## 2012-06-08 NOTE — Progress Notes (Signed)
Speech Language Pathology Daily Session Notes  Patient Details  Name: Raymond Rangel MRN: [id number] Date of Birth: 06-14-1931  Today's Date: 06/08/2012  Session 1 Time: 8295-6213 Time Calculation: 45 min  Session 2 Time: 1130-1200 Time Calculation (min): 30 min  Short Term Goals: Week 2: SLP Short Term Goal 1 (Week 2): Patient will follow 1 step directions during functional, familiar tasks with mod assist verbal and visual cues. SLP Short Term Goal 2 (Week 2): Patient will demonstrate basic problem solving during functional, familiar tasks with mod assist verbal and visual cues. SLP Short Term Goal 3 (Week 2): Patient will consume Dys.1 textures and thin liquid with min assist verbal, visual and tactile cues with no overt s/s of aspiration.   Skilled Therapeutic Interventions:  Session 1: Treatment focus on dysphagia and cognitive goals. Pt required Max A verbal, visual and tactile cues that faded to Mod A to initiate self-feeding throughout the session. Pt required hand over hand assist to utilize his right UE as a stabilizer to consume pudding and for initiation of liquids. Pt required Min A verbal cues to utilize multiple swallows throughout the meal. Pt without overt s/s of aspiration throughout the meal. Pt's daughter present throughout the session and educated on current swallowing function and goals.    Session 2: Pt seen in [employer] with focus on dysphagia and self-feeding goals with focus on functional use of right hand with curved built up handle for self-feeding. Pt needed mod A to grasp the spoon properly and accurately bring the food to his mouth. Pt required Min verbal cues to utilize small bites and multiple swallows. Pt without overt s/s of aspiration throughout the meal.   FIM:  Comprehension Comprehension Mode: Auditory Comprehension: 3-Understands basic 50 - 74% of the time/requires cueing 25 - 50%  of the time Expression Expression Mode: Verbal Expression:  1-Expresses basis less than 25% of the time/requires cueing greater than 75% of the time. Social Interaction Social Interaction: 3-Interacts appropriately 50 - 74% of the time - May be physically or verbally inappropriate. Problem Solving Problem Solving: 2-Solves basic 25 - 49% of the time - needs direction more than half the time to initiate, plan or complete simple activities Memory Memory: 1-Recognizes or recalls less than 25% of the time/requires cueing greater than 75% of the time FIM - Eating Eating Activity: 4: Help with picking up utensils  Pain Pain Assessment Pain Assessment: No/denies pain  Therapy/Group: Individual Therapy and Group Therapy  Raymond Rangel 06/08/2012, 3:52 PM

## 2012-06-08 NOTE — Progress Notes (Signed)
Physical Therapy Note  Patient Details  Name: Raymond Rangel MRN: [id number] Date of Birth: 09-28-1931 Today's Date: 06/08/2012  2:15 - 3:00 45 minutes Individual session with +2 assist Patient denies pain.  Patient resting in bed upon entering room. Patient difficult to arouse. Patient max assist for supine to sit and to transfer to wheelchair. Attempted to get patient to use washcloth to wipe face to wake up - could not get patient to perform task with maximal verbal and physical cueing. Patient more lethargic and with garbled speech. Attempted sit to stand with +3 for 4 times with little success. Patient could not motor plan process and was total assist. Finally, patient was able to stand and ambulate with shopping cart and +2 assist 120 feet x 1 and 100 feet x 1. Patient required assist to progress right LE about 90% of the time. Patient with scissor gait and narrow base of support, decreased stride bilaterally, flexed at trunk and hips, and pushing to right.    Arelia Longest M 06/08/2012, 4:02 PM

## 2012-06-09 ENCOUNTER — Inpatient Hospital Stay (HOSPITAL_COMMUNITY): Payer: Medicare Other | Admitting: Occupational Therapy

## 2012-06-09 ENCOUNTER — Inpatient Hospital Stay (HOSPITAL_COMMUNITY): Payer: Medicare Other | Admitting: Physical Therapy

## 2012-06-09 LAB — GLUCOSE, CAPILLARY
Glucose-Capillary: 100 mg/dL — ABNORMAL HIGH (ref 70–99)
Glucose-Capillary: 104 mg/dL — ABNORMAL HIGH (ref 70–99)
Glucose-Capillary: 95 mg/dL (ref 70–99)
Glucose-Capillary: 227 mg/dL — ABNORMAL HIGH (ref 70–99)

## 2012-06-09 NOTE — Progress Notes (Signed)
Raymond Rangel is a 79 y.o. male 09/24/1931 [id number]  Subjective: No new complaints. No new problems. Slept well. Feeling OK.  Objective: Vital signs in last 24 hours: Temp:  [97.6 F (36.4 C)-98.1 F (36.7 C)] 97.6 F (36.4 C) (03/01 0501) Pulse Rate:  [75-80] 75 (03/01 0501) Resp:  [18] 18 (03/01 0501) BP: (90-131)/(60-81) 90/60 mmHg (03/01 0501) SpO2:  [98 %-99 %] 98 % (03/01 0501) Weight change:  Last BM Date: 06/05/12  Intake/Output from previous day: 02/28 0701 - 03/01 0700 In: 480 [P.O.:480] Out: 350 [Urine:350] Last cbgs: CBG (last 3)   Recent Labs  06/08/12 1115 06/08/12 1624 06/08/12 2102  GLUCAP 101* 91 94     Physical Exam General: No apparent distress    HEENT: moist mucosa Lungs: Normal effort. Lungs clear to auscultation, no crackles or wheezes. Cardiovascular: Regular rate and rhythm, no edema Musculoskeletal:  No change from before Neurological: No new neurological deficits Wounds: N/A    Skin: clear Alert, cooperative   Lab Results: BMET    Component Value Date/Time   NA 135 06/08/2012 1546   K 4.5 06/08/2012 1546   CL 99 06/08/2012 1546   CO2 24 06/08/2012 1546   GLUCOSE 96 06/08/2012 1546   BUN 20 06/08/2012 1546   CREATININE 0.96 06/08/2012 1546   CALCIUM 9.6 06/08/2012 1546   GFRNONAA 77* 06/08/2012 1546   GFRAA 90* 06/08/2012 1546   CBC    Component Value Date/Time   WBC 12.2* 05/31/2012 0635   RBC 4.95 05/31/2012 0635   HGB 14.2 05/31/2012 0635   HCT 42.4 05/31/2012 0635   PLT 180 05/31/2012 0635   MCV 85.7 05/31/2012 0635   MCH 28.7 05/31/2012 0635   MCHC 33.5 05/31/2012 0635   RDW 15.2 05/31/2012 0635   LYMPHSABS 2.0 05/31/2012 0635   MONOABS 0.9 05/31/2012 0635   EOSABS 0.5 05/31/2012 0635   BASOSABS 0.1 05/31/2012 0635    Studies/Results: No results found.  Medications: I have reviewed the patient's current medications.  Assessment/Plan:  1. embolic left PCA infarct  2. DVT Prophylaxis/Anticoagulation: Subcutaneous Lovenox.  Monitor platelet counts and any signs of bleeding Hx of Left femoral L popliteal,Left tibial subacute DVT diagnosed January 2013.lovenox 30 bid currently  3. Mood/dementia. Seroquel 25 mg twice a day, Ativan when necessary. Patient displayed baseline cognitive deficits per family. Will expect wax and wane.  4. Neuropsych: This patient is not capable of making decisions on his/her own behalf.  5. Hyperlipidemia. Zocor  6. Diabetes mellitus with peripheral neuropathy. Hemoglobin A1c 6.7. Patient on diet control prior to admission. Check CBGs a.c. and at bedtime- fair control generally. Cover spikes with SSI for now.  7. Dysphagia. Dysphagia 1 thin liquids. Speech therapy followup. Monitor for any signs of aspiration.  8. Leukocytosis with UTI--100k E Coli-- on cipro day 6/7  9. Constipation nsg to give sorbitol 10. Spasticity tone improved on Zanaflex but too sedated,try baclofen, less sedated, monitor tone. Continue with current prescription therapy as reflected on the Med list.        Length of stay, days: 10  Sonda Primes , MD 06/09/2012, 8:47 AM

## 2012-06-09 NOTE — Progress Notes (Signed)
Speech Language Pathology Daily Session Note  Patient Details  Name: Raymond Rangel MRN: [id number] Date of Birth: 11/06/1931  Today's Date: 06/09/2012 Time: 1200-1230 Time Calculation (min): 30 min  Short Term Goals: Week 2: SLP Short Term Goal 1 (Week 2): Patient will follow 1 step directions during functional, familiar tasks with mod assist verbal and visual cues. SLP Short Term Goal 2 (Week 2): Patient will demonstrate basic problem solving during functional, familiar tasks with mod assist verbal and visual cues. SLP Short Term Goal 3 (Week 2): Patient will consume Dys.1 textures and thin liquid with min assist verbal, visual and tactile cues with no overt s/s of aspiration.   Skilled Therapeutic Interventions: Skilled group co-treatment with OT; SLP focused on addressing dysphagia and cognition goals through self-feeding.  SLP facilitated session with mod assist verbal and tactile cues to carryover safe swallow strategies while consuming Dys.1 textures and thin liquids; OT facilitated self feeding.  Patient with limited p.o. intake to day and stated "something's not right," what was consumed resulted in no overt s/s of aspiration during meal.   Recommend continuation of current plan of care and Dys.2 trials when awake and alert.   FIM:  Comprehension Comprehension Mode: Auditory Comprehension: 3-Understands basic 50 - 74% of the time/requires cueing 25 - 50%  of the time Expression Expression Mode: Verbal Expression: 1-Expresses basis less than 25% of the time/requires cueing greater than 75% of the time. Social Interaction Social Interaction: 3-Interacts appropriately 50 - 74% of the time - May be physically or verbally inappropriate. Problem Solving Problem Solving: 2-Solves basic 25 - 49% of the time - needs direction more than half the time to initiate, plan or complete simple activities Memory Memory: 1-Recognizes or recalls less than 25% of the time/requires cueing greater than 75%  of the time FIM - Eating Eating Activity: 4: Help with picking up utensils  Pain Pain Assessment Pain Assessment: No/denies pain  Therapy/Group: Group Therapy  Charlane Ferretti., CCC-SLP [phone]  Raymond Rangel 06/09/2012, 1:31 PM

## 2012-06-09 NOTE — Progress Notes (Signed)
Occupational Therapy Note  Patient Details  Name: Raymond Rangel MRN: [id number] Date of Birth: [birth year] Today's Date: 06/09/2012  [employer] with SLP 1130-1200 No c/o pain  Self feeding group with focus on functional use of non dominant left hand and dominant right hand, attention to and visually scan left of midline to locate items.  Pt needed HOH mod A to grasp the spoon properly in left hand approximately 50% of the time then he could accurately get the food into his mouth.  Did not focus on self feeding with RUE however patient did spontaneously initiate use of right hand to attempt to pick up glass yet required mod-max [facility] assist to grasp cup and to be successful using right hand.  Continued to work on basic cognition and safe swallowing techniques.        Shante Maysonet 06/09/2012, 1:08 PM

## 2012-06-09 NOTE — Progress Notes (Signed)
Physical Therapy Session Note  Patient Details  Name: Raymond Rangel MRN: [id number] Date of Birth: 04-21-1931  Today's Date: 06/09/2012 Time: 1015-1100 Time Calculation (min): 45 min  Short Term Goals: Week 1:  PT Short Term Goal 1 (Week 1): Pt will perform bed mobility with mod assist. PT Short Term Goal 1 - Progress (Week 1): Met PT Short Term Goal 2 (Week 1): Pt will transfer bed><w/c to L with mod assist consistently. PT Short Term Goal 2 - Progress (Week 1): Not met PT Short Term Goal 3 (Week 1): Pt will perform gait x 25' with +2 assist. PT Short Term Goal 3 - Progress (Week 1): Met PT Short Term Goal 4 (Week 1): Pt will stand x 2 minutes during functional task with mod assist. PT Short Term Goal 4 - Progress (Week 1): Not met  Therapy Documentation Precautions:  Precautions: Fall Precaution Comments: increased tone right side (extensor in leg, flexor in arm); dementai Restrictions Weight Bearing Restrictions: No Pain: denies pain  Therapeutic Activity:(45') Bed Mobility rolling Right with S/min-A, rolling Left min/Mod-A,  Left sidelying ->sit with Mod-A and verbal cues for patient to push up with Left UE.  Static Sitting Balance at EOB with Left hand on bed rail with S/min-A. Transfer sit<->stand with Max-A.  Squat pivot transfer bed<->w/c with Max-A.  Assisted patient with dressing into sweat pants and sweat shirt/long sleeve with Max-A.  See FIM for current functional status  Therapy/Group: Individual Therapy  Pratyush Ammon J 06/09/2012, 10:25 AM

## 2012-06-10 ENCOUNTER — Inpatient Hospital Stay (HOSPITAL_COMMUNITY): Payer: Medicare Other

## 2012-06-10 ENCOUNTER — Inpatient Hospital Stay (HOSPITAL_COMMUNITY): Payer: Medicare Other | Admitting: *Deleted

## 2012-06-10 DIAGNOSIS — E119 Type 2 diabetes mellitus without complications: Secondary | ICD-10-CM

## 2012-06-10 DIAGNOSIS — I2510 Atherosclerotic heart disease of native coronary artery without angina pectoris: Secondary | ICD-10-CM

## 2012-06-10 DIAGNOSIS — F0390 Unspecified dementia without behavioral disturbance: Secondary | ICD-10-CM

## 2012-06-10 DIAGNOSIS — I6350 Cerebral infarction due to unspecified occlusion or stenosis of unspecified cerebral artery: Secondary | ICD-10-CM

## 2012-06-10 LAB — GLUCOSE, CAPILLARY
Glucose-Capillary: 100 mg/dL — ABNORMAL HIGH (ref 70–99)
Glucose-Capillary: 94 mg/dL (ref 70–99)
Glucose-Capillary: 96 mg/dL (ref 70–99)
Glucose-Capillary: 109 mg/dL — ABNORMAL HIGH (ref 70–99)

## 2012-06-10 LAB — TROPONIN I: Troponin I: <0.3 ng/mL (ref <0.30)

## 2012-06-10 LAB — CK TOTAL AND CKMB (NOT AT ARMC)
CK, MB: 2.7 ng/mL (ref 0.3–4.0)
Relative Index: INVALID (ref 0.0–2.5)

## 2012-06-10 MED ORDER — ALBUTEROL SULFATE (5 MG/ML) 0.5% IN NEBU
2.5000 mg | INHALATION_SOLUTION | RESPIRATORY_TRACT | Status: AC
  Administered 2012-06-10: 2.5 mg via RESPIRATORY_TRACT

## 2012-06-10 MED ORDER — ALBUTEROL SULFATE (5 MG/ML) 0.5% IN NEBU
INHALATION_SOLUTION | RESPIRATORY_TRACT | Status: AC
  Filled 2012-06-10: qty 0.5

## 2012-06-10 NOTE — Plan of Care (Signed)
Problem: RH BLADDER ELIMINATION Goal: RH STG MANAGE BLADDER WITH ASSISTANCE STG Manage Bladder With max assist timed toileting  Outcome: Not Progressing Using condom cath at [facility] and incont during day, does not call for toileting and does not notify staff when wet. Attempted timed toileting unsuccessful

## 2012-06-10 NOTE — Progress Notes (Signed)
Occupational Therapy Session Note  Patient Details  Name: Raymond Rangel MRN: [id number] Date of Birth: 08-Sep-1931  Today's Date: 06/10/2012 Time:  - 0825-0925 (60 min)    Short Term Goals: Week 1:  OT Short Term Goal 1 (Week 1): Pt will roll in bed with mod assist to assist caregivers with clothing changes. OT Short Term Goal 1 - Progress (Week 1): Progressing toward goal OT Short Term Goal 2 (Week 1): Pt will sit on EOB with mod assist to engage in grooming tasks. OT Short Term Goal 2 - Progress (Week 1): Met OT Short Term Goal 3 (Week 1): Pt will don shirt with max assist. OT Short Term Goal 3 - Progress (Week 1): Met Week 2:  OT Short Term Goal 1 (Week 2): Pt will consistently transfer to the toilet using a squat pivot transfer with max assist x1. OT Short Term Goal 2 (Week 2): Pt will sit to stand from toilet and maintain standing with mod assist. OT Short Term Goal 3 (Week 2): Pt will demonstrate improved RUE motor control by bathing left arm with min assist.  Skilled Therapeutic Interventions/Progress Updates:      Performed ADL at sink.  Wife present to observe.  Performed sit to stand with mod assist to don pants.  Wife assisted pulling up pants.  Practiiced toilet transfer to toilet standing and pivoting to right with max assist with pt having difficulty rotating to right.  Pt.'s Right LE with increased extensor tone.  Once pt sat down Right LE extended out and was sitting crooked   Able to flex right leg and transferred back to wc with max assist.     Therapy Documentation Precautions:  Precautions Precautions: Fall Precaution Comments: increased tone right side (extensor in leg, flexor in arm); dementai Restrictions Weight Bearing Restrictions: No   Pain:  none      See FIM for current functional status  Therapy/Group: Individual Therapy  [name] 06/10/2012, 9:21 AM

## 2012-06-10 NOTE — Plan of Care (Signed)
Problem: RH BOWEL ELIMINATION Goal: RH STG MANAGE BOWEL WITH ASSISTANCE STG Manage Bowel with max assist timed toilet (prn meds only)  Outcome: Progressing LBM 2/25, prn sorbitol given 3/2 awaiting results.

## 2012-06-10 NOTE — Progress Notes (Signed)
Called at 1419 by primary RN for low sats in the 80's on RA and chest pain.  Upon arrival to patient's room, RN at bedside.  Patient lying in bed supine with nasal cannula 2 LPM.  Extremities cool, skin dry.  Patient is alert.  EKG done. Results reviewed.  Oxygen increased to 4 LPM, pulse ox 98-100%, HR 80.  RR 22.  NAD noted.  MD updated, orders received.  Rn to call if assistance needed.  Will monitor

## 2012-06-10 NOTE — Progress Notes (Signed)
Raymond Rangel is a 79 y.o. male February 03, 1932 [id number]  Subjective: No new complaints. No new problems. Slept well. Feeling OK. Eating breakfast  Objective: Vital signs in last 24 hours: Temp:  [97.5 F (36.4 C)-98.1 F (36.7 C)] 97.5 F (36.4 C) (03/02 0536) Pulse Rate:  [72-73] 72 (03/02 0536) Resp:  [18] 18 (03/02 0536) BP: (115-122)/(75-82) 122/82 mmHg (03/02 0536) SpO2:  [96 %-97 %] 97 % (03/02 0536) Weight change:  Last BM Date: 06/05/12  Intake/Output from previous day: 03/01 0701 - 03/02 0700 In: 200 [P.O.:200] Out: 350 [Urine:350] Last cbgs: CBG (last 3)   Recent Labs  06/09/12 1646 06/09/12 2042 06/10/12 0725  GLUCAP 95 227* 100*     Physical Exam General: No apparent distress    HEENT: moist mucosa Lungs: Normal effort. Lungs clear to auscultation, no crackles or wheezes. Cardiovascular: Regular rate and rhythm, no edema Musculoskeletal:  No change from before Neurological: No new neurological deficits Wounds: N/A    Skin: clear Alert, cooperative   Lab Results: BMET    Component Value Date/Time   NA 135 06/08/2012 1546   K 4.5 06/08/2012 1546   CL 99 06/08/2012 1546   CO2 24 06/08/2012 1546   GLUCOSE 96 06/08/2012 1546   BUN 20 06/08/2012 1546   CREATININE 0.96 06/08/2012 1546   CALCIUM 9.6 06/08/2012 1546   GFRNONAA 77* 06/08/2012 1546   GFRAA 90* 06/08/2012 1546   CBC    Component Value Date/Time   WBC 12.2* 05/31/2012 0635   RBC 4.95 05/31/2012 0635   HGB 14.2 05/31/2012 0635   HCT 42.4 05/31/2012 0635   PLT 180 05/31/2012 0635   MCV 85.7 05/31/2012 0635   MCH 28.7 05/31/2012 0635   MCHC 33.5 05/31/2012 0635   RDW 15.2 05/31/2012 0635   LYMPHSABS 2.0 05/31/2012 0635   MONOABS 0.9 05/31/2012 0635   EOSABS 0.5 05/31/2012 0635   BASOSABS 0.1 05/31/2012 0635    Studies/Results: No results found.  Medications: I have reviewed the patient's current medications.  Assessment/Plan:  1. embolic left PCA infarct. Spoke w/pt and his wife 2. DVT  Prophylaxis/Anticoagulation: Subcutaneous Lovenox. Monitor platelet counts and any signs of bleeding Hx of Left femoral L popliteal,Left tibial subacute DVT diagnosed January 2013.lovenox 30 bid currently  3. Mood/dementia. Seroquel 25 mg twice a day, Ativan when necessary. Patient displayed baseline cognitive deficits per family. Will expect wax and wane.  4. Neuropsych: This patient is not capable of making decisions on his/her own behalf.  5. Hyperlipidemia. Zocor  6. Diabetes mellitus with peripheral neuropathy. Hemoglobin A1c 6.7. Patient on diet control prior to admission. Check CBGs a.c. and at bedtime- fair control generally. Cover spikes with SSI for now.  7. Dysphagia. Dysphagia 1 thin liquids. Speech therapy followup. Monitor for any signs of aspiration.  8. Leukocytosis with UTI--100k E Coli-- on cipro day 6/7  9. Constipation nsg to give sorbitol 10. Spasticity tone improved on Zanaflex but too sedated,try baclofen, less sedated, monitor tone. Continue with current prescription therapy as reflected on the Med list.        Length of stay, days: 11  Sonda Primes , MD 06/10/2012, 8:58 AM

## 2012-06-11 ENCOUNTER — Inpatient Hospital Stay (HOSPITAL_COMMUNITY): Payer: Medicare Other | Admitting: Speech Pathology

## 2012-06-11 ENCOUNTER — Inpatient Hospital Stay (HOSPITAL_COMMUNITY): Payer: Medicare Other | Admitting: Occupational Therapy

## 2012-06-11 ENCOUNTER — Inpatient Hospital Stay (HOSPITAL_COMMUNITY): Payer: Medicare Other

## 2012-06-11 DIAGNOSIS — I69998 Other sequelae following unspecified cerebrovascular disease: Secondary | ICD-10-CM

## 2012-06-11 DIAGNOSIS — G8110 Spastic hemiplegia affecting unspecified side: Secondary | ICD-10-CM

## 2012-06-11 DIAGNOSIS — R209 Unspecified disturbances of skin sensation: Secondary | ICD-10-CM

## 2012-06-11 DIAGNOSIS — I6330 Cerebral infarction due to thrombosis of unspecified cerebral artery: Secondary | ICD-10-CM

## 2012-06-11 LAB — GLUCOSE, CAPILLARY
Glucose-Capillary: 133 mg/dL — ABNORMAL HIGH (ref 70–99)
Glucose-Capillary: 134 mg/dL — ABNORMAL HIGH (ref 70–99)

## 2012-06-11 MED ORDER — BACLOFEN 5 MG HALF TABLET
5.0000 mg | ORAL_TABLET | Freq: Three times a day (TID) | ORAL | Status: DC
  Administered 2012-06-11 – 2012-06-12 (×5): 5 mg via ORAL
  Filled 2012-06-11 (×9): qty 1

## 2012-06-11 NOTE — Progress Notes (Signed)
Speech Language Pathology Daily Session Note  Patient Details  Name: Santosh Petter MRN: [id number] Date of Birth: 30-Mar-1932  Today's Date: 06/11/2012 Time: 1000-1030 Time Calculation (min): 30 min  Short Term Goals: Week 2: SLP Short Term Goal 1 (Week 2): Patient will follow 1 step directions during functional, familiar tasks with mod assist verbal and visual cues. SLP Short Term Goal 1 - Progress (Week 2): Progressing toward goal SLP Short Term Goal 2 (Week 2): Patient will demonstrate basic problem solving during functional, familiar tasks with mod assist verbal and visual cues. SLP Short Term Goal 2 - Progress (Week 2): Progressing toward goal SLP Short Term Goal 3 (Week 2): Patient will consume Dys.1 textures and thin liquid with min assist verbal, visual and tactile cues with no overt s/s of aspiration.  SLP Short Term Goal 3 - Progress (Week 2): Met  Skilled Therapeutic Interventions: Therapy session today focused on following directions, answering functional questions re: therapies, recall of therapy activities, and tolerance of thin liquids.  Pt reported several times throughout session that "I just can't think today". Pt was able to follow simple 1-step directions with occasional repetition. Mod-max cues required to engage in discussion about therapy activities/ focus. Max cueing required for simple verbal problem solving tasks.  No overt s/s aspiration observed or reported. Nursing indicated pt ate a good breakfast this am.  Daughter present during therapy session today.   FIM:  Comprehension Comprehension Mode: Auditory Comprehension: 4-Understands basic 75 - 89% of the time/requires cueing 10 - 24% of the time Expression Expression Mode: Verbal Expression: 2-Expresses basic 25 - 49% of the time/requires cueing 50 - 75% of the time. Uses single words/gestures. Social Interaction Social Interaction: 3-Interacts appropriately 50 - 74% of the time - May be physically or verbally  inappropriate. Problem Solving Problem Solving: 2-Solves basic 25 - 49% of the time - needs direction more than half the time to initiate, plan or complete simple activities Memory Memory: 1-Recognizes or recalls less than 25% of the time/requires cueing greater than 75% of the time FIM - Eating Eating Activity: 4: Helper occasionally brings food to mouth  Pain Pain Assessment Pain Assessment: No/denies pain Faces Pain Scale: Hurts whole lot Pain Location: Thoracic Pain Orientation: Left Pain Descriptors:  ("severe pain") Pain Intervention(s): RN made aware;Emotional support;Distraction  Therapy/Group: Individual Therapy Celia B. [place], MSP, CCC-SLP  Leigh Aurora 06/11/2012, 12:34 PM

## 2012-06-11 NOTE — Progress Notes (Signed)
Occupational Therapy Session Note  Patient Details  Name: Raymond Rangel MRN: [id number] Date of Birth: 1931-12-31  Today's Date: 06/11/2012 Time: 0845-1000 and 1110-1130 (cotx with PT) Time Calculation (min): 75 min and 20 min  Short Term Goals: Week 1:  OT Short Term Goal 1 (Week 1): Pt will roll in bed with mod assist to assist caregivers with clothing changes. OT Short Term Goal 1 - Progress (Week 1): Progressing toward goal OT Short Term Goal 2 (Week 1): Pt will sit on EOB with mod assist to engage in grooming tasks. OT Short Term Goal 2 - Progress (Week 1): Met OT Short Term Goal 3 (Week 1): Pt will don shirt with max assist. OT Short Term Goal 3 - Progress (Week 1): Met Week 2:  OT Short Term Goal 1 (Week 2): Pt will consistently transfer to the toilet using a squat pivot transfer with max assist x1. OT Short Term Goal 2 (Week 2): Pt will sit to stand from toilet and maintain standing with mod assist. OT Short Term Goal 3 (Week 2): Pt will demonstrate improved RUE motor control by bathing left arm with min assist.  Skilled Therapeutic Interventions/Progress Updates:    Visit 1:  Pt seen for BADL retraining of B/D with a focus on attention, sitting balance, trunk control, sit to stand and standing. Pt's daughter present during the session and very helpful. LB bathing from bed level and UB adls from chair level. Pt's performance waxed and waned. At times, demonstrated good initiation with sitting up to EOB with only min assist. Other times needed a great deal of cuing.  Pt did initiate more with washing his LEs. Pt remained in w/c with daughter in room.  Visit 2:  Cotx with PT to address trunk control, R side awareness, use of BUEs in functional movement patterns, and BLE motor control with sit to stand, balancing on high surface.  Pt needed max cues to engage, was having great difficulty visually focusing on task, and demonstrating severely impaired motor planning with simple reaching tasks.  Pt did transfer to w/c with only mod assist.  Pt daughter took him to diners club.   Therapy Documentation Precautions:  Precautions Precautions: Fall Precaution Comments: increased tone right side (extensor in leg, flexor in arm); dementai Restrictions Weight Bearing Restrictions: No   Pain: Pain Assessment Pain Assessment: No/denies pain during both sessions  ADL:  See FIM for current functional status  Therapy/Group: Individual Therapy visit 1, cotx with PT visit 2  SAGUIER,JULIA 06/11/2012, 12:33 PM

## 2012-06-11 NOTE — Progress Notes (Signed)
Physical Therapy Session Note  Patient Details  Name: Raymond Rangel MRN: [id number] Date of Birth: 06/08/1931  Today's Date: 06/11/2012 Time: 1030-1109 Time Calculation (min): 39 min  Short Term Goals: Week 2:  PT Short Term Goal 1 (Week 2): Pt will move sidelying > sit with min assist 50% of attempts. PT Short Term Goal 2 (Week 2): Pt will perform transfer bed>< w/c to R with mod assist 50% of attempts. PT Short Term Goal 3 (Week 2): Pt will perform gait x 25' with LRAD with assist of 1 person. PT Short Term Goal 4 (Week 2): Pt will perform gait x 25' with LRAD with assist of 1 person. PT Short Term Goal 5 (Week 2): Pt will stand x 5 minutes during functional task with max assist.  Skilled Therapeutic Interventions/Progress Updates:  Co-treatment with OT for +2 skilled functional mobility in sitting, standing, and ambulating.  neuromuscular re-education LUE and LLE via forced use, demo, manual cues for: = wt bearing bil LEs in standing at sturdy table during RUE task involving retrieving items on R; pt continually pushed himself to the R requiring +2 assist to prevent fall; RUE task required hand over hand assist -midline orientation in sitting on high mat, feet slightly supported, with assistance to shift wt L>< R without RLE extensor tone increasing -attention to RUE and R environment during R or LUE task requiring visual scanning to L and R  Squat pivot transfer to L and R +2 assist due to hypertonus RLE and midline disorientation/fear  Participation limited today due to difficulty following 1 step directions.    Therapy Documentation Precautions:  Precautions Precautions: Fall Precaution Comments: increased tone right side (extensor in leg, flexor in arm); dementai Restrictions Weight Bearing Restrictions: No  Pain: Pain Assessment Pain Assessment: No/denies pain   Locomotion : Ambulation Ambulation/Gait Assistance: 1: +2 Total assist     See FIM for current functional  status  Therapy/Group: Individual Therapy and Co-Treatment  COOK,CAROLINE 06/11/2012, 3:16 PM

## 2012-06-11 NOTE — Progress Notes (Signed)
Speech Language Pathology Daily Session Note  Patient Details  Name: Raymond Rangel MRN: [id number] Date of Birth: Oct 18, 1931  Today's Date: 06/11/2012 Time: 1130-1145 Time Calculation (min): 15 min  Short Term Goals: Week 2: SLP Short Term Goal 1 (Week 2): Patient will follow 1 step directions during functional, familiar tasks with mod assist verbal and visual cues. SLP Short Term Goal 1 - Progress (Week 2): Progressing toward goal SLP Short Term Goal 2 (Week 2): Patient will demonstrate basic problem solving during functional, familiar tasks with mod assist verbal and visual cues. SLP Short Term Goal 2 - Progress (Week 2): Progressing toward goal SLP Short Term Goal 3 (Week 2): Patient will consume Dys.1 textures and thin liquid with min assist verbal, visual and tactile cues with no overt s/s of aspiration.  SLP Short Term Goal 3 - Progress (Week 2): Met  Skilled Therapeutic Interventions: Skilled group co-treatment with OT; SLP focused on addressing dysphagia and cognition goals through self-feeding. SLP facilitated session with mod assist verbal and tactile cues to carryover safe swallow strategies while consuming Dys.1 textures and thin liquids; pt without overt s/s of aspiration. OT facilitated self-feeding and pt required Max verbal and tactile cues to attend to right UE and environment during self-feeding task. Pt with limited PO intake, suspect due to fatigue and decreased sustained attention.     FIM:  Comprehension Comprehension Mode: Auditory Comprehension: 3-Understands basic 50 - 74% of the time/requires cueing 25 - 50%  of the time Expression Expression Mode: Verbal Expression: 2-Expresses basic 25 - 49% of the time/requires cueing 50 - 75% of the time. Uses single words/gestures. Social Interaction Social Interaction: 2-Interacts appropriately 25 - 49% of time - Needs frequent redirection. Problem Solving Problem Solving: 2-Solves basic 25 - 49% of the time - needs direction  more than half the time to initiate, plan or complete simple activities Memory Memory: 1-Recognizes or recalls less than 25% of the time/requires cueing greater than 75% of the time FIM - Eating Eating Activity: 2: Hand over hand assist  Pain Pain Assessment Pain Assessment: No/denies pain   Therapy/Group: Group Therapy  PAYNE, COURTNEY 06/11/2012, 1:01 PM

## 2012-06-11 NOTE — Progress Notes (Signed)
Occupational Therapy Note  Patient Details  Name: Raymond Rangel MRN: [id number] Date of Birth: November 05, 1931 Today's Date: 06/11/2012  Time: 4098-1191 (cotx with Speech Therapy-total time 1130-1205) Pt denies pain Group therapy Pt participated in self feeding group with focus on functional use of RUE for self feeding and task initiation and attention to task.  Pt required hand-over-hand assistance for self feeding with adapative utensil.  Pt required max verbal cues to initiate task and attend to task.  Lavone Neri [facility] 06/11/2012, 3:30 PM

## 2012-06-11 NOTE — Progress Notes (Signed)
Patient ID: Raymond Rangel, male   DOB: 07-05-1931, 79 y.o.   MRN: [id number]  Subjective/Complaints: 78 y.o. right-handed male history of TIA, diabetes mellitus and dementia. 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 Admitted 05/26/2012 with right-sided weakness and slurred speech. MRI shows acute left PCA territory infarcts without hemorrhage. MRA of the head with 50% mid basilar stenosis. Proximal occlusion left PCA. Echocardiogram with ejection fraction 60% and grade 1 diastolic dysfunction. Carotid Dopplers with no ICA stenosis. Patient did not receive TPA. Neurology consulted patient maintained on aspirin 81 mg daily as prior to admission and Plavix added to regimen. Subcutaneous Lovenox for DVT prophylaxis. Currently patient is maintained on a dysphagia 1 thin liquid diet after modified barium swallow study 05/28/2012.Monitoring of cognition with noted history of dementia placed on Seroquel. Tone improved but more lethargic per PT Incont B and B per RN  SLP notes no cough with meal, good intake  No complaints. Pleasantly confused.Supervision with meals Review of Systems  Review of Systems  Constitutional: Negative.   Respiratory: Positive for cough. Negative for hemoptysis, sputum production, shortness of breath and wheezing.        With meals  Gastrointestinal: Negative.   Neurological: Positive for focal weakness.  All other systems reviewed and are negative.    Objective: Vital Signs: Blood pressure 127/81, pulse 73, temperature 97.7 F (36.5 C), temperature source Oral, resp. rate 19, height 6\' 2"  (1.88 m), weight 102.6 kg (226 lb 3.1 oz), SpO2 95.00%. Dg Swallowing Func-speech Pathology  05/29/2012  [name], CCC-SLP     05/29/2012 11:55 AM   Recommend  to proceed with dysphagia 1 (puree) and thin liquids with full  assist with each meals to ensure safety.  Recommend puree mainly  due to severity of oral dysphagia.  Please note patient required  hand  over hand assist for self feeding with majority of PO trials  administered by treating SLP.  Only trace silent aspiration  occurred with nectar thick liquids and x1 with thin liquid but  patient was in optimal upright positioning.       Results for orders placed during the hospital encounter of 05/30/12 (from the past 72 hour(s))  GLUCOSE, CAPILLARY     Status: Abnormal   Collection Time    06/08/12 11:15 AM      Result Value Range   Glucose-Capillary 101 (*) 70 - 99 mg/dL  BASIC METABOLIC PANEL     Status: Abnormal   Collection Time    06/08/12  3:46 PM      Result Value Range   Sodium 135  135 - 145 mEq/L   Potassium 4.5  3.5 - 5.1 mEq/L   Chloride 99  96 - 112 mEq/L   CO2 24  19 - 32 mEq/L   Glucose, Bld 96  70 - 99 mg/dL   BUN 20  6 - 23 mg/dL   Creatinine, Ser 6.96  0.50 - 1.35 mg/dL   Calcium 9.6  8.4 - 29.5 mg/dL   GFR calc non Af Amer 77 (*) >90 mL/min   GFR calc Af Amer 90 (*) >90 mL/min   Comment:            The eGFR has been calculated     using the CKD EPI equation.     This calculation has not been     validated in all clinical     situations.     eGFR's persistently     <  90 mL/min signify     possible Chronic Kidney Disease.  GLUCOSE, CAPILLARY     Status: None   Collection Time    06/08/12  4:24 PM      Result Value Range   Glucose-Capillary 91  70 - 99 mg/dL  GLUCOSE, CAPILLARY     Status: None   Collection Time    06/08/12  9:02 PM      Result Value Range   Glucose-Capillary 94  70 - 99 mg/dL   Comment 1 Notify RN    GLUCOSE, CAPILLARY     Status: Abnormal   Collection Time    06/09/12  7:31 AM      Result Value Range   Glucose-Capillary 100 (*) 70 - 99 mg/dL  GLUCOSE, CAPILLARY     Status: Abnormal   Collection Time    06/09/12 11:17 AM      Result Value Range   Glucose-Capillary 104 (*) 70 - 99 mg/dL  GLUCOSE, CAPILLARY     Status: None   Collection Time    06/09/12  4:46 PM      Result Value Range   Glucose-Capillary 95  70 - 99 mg/dL   GLUCOSE, CAPILLARY     Status: Abnormal   Collection Time    06/09/12  8:42 PM      Result Value Range   Glucose-Capillary 227 (*) 70 - 99 mg/dL   Comment 1 Notify RN    GLUCOSE, CAPILLARY     Status: Abnormal   Collection Time    06/10/12  7:25 AM      Result Value Range   Glucose-Capillary 100 (*) 70 - 99 mg/dL  GLUCOSE, CAPILLARY     Status: None   Collection Time    06/10/12 11:35 AM      Result Value Range   Glucose-Capillary 94  70 - 99 mg/dL  GLUCOSE, CAPILLARY     Status: None   Collection Time    06/10/12  4:49 PM      Result Value Range   Glucose-Capillary 96  70 - 99 mg/dL  CK TOTAL AND CKMB     Status: None   Collection Time    06/10/12  5:03 PM      Result Value Range   Total CK 92  7 - 232 U/L   CK, MB 2.7  0.3 - 4.0 ng/mL   Relative Index RELATIVE INDEX IS INVALID  0.0 - 2.5   Comment: WHEN CK < 100 U/L             TROPONIN I     Status: None   Collection Time    06/10/12  5:03 PM      Result Value Range   Troponin I <0.30  <0.30 ng/mL   Comment:            Due to the release kinetics of cTnI,     a negative result within the first hours     of the onset of symptoms does not rule out     myocardial infarction with certainty.     If myocardial infarction is still suspected,     repeat the test at appropriate intervals.  GLUCOSE, CAPILLARY     Status: Abnormal   Collection Time    06/10/12  9:26 PM      Result Value Range   Glucose-Capillary 109 (*) 70 - 99 mg/dL   Comment 1 Notify RN    CK TOTAL AND CKMB  Status: None   Collection Time    06/10/12 11:12 PM      Result Value Range   Total CK 97  7 - 232 U/L   CK, MB 2.5  0.3 - 4.0 ng/mL   Relative Index RELATIVE INDEX IS INVALID  0.0 - 2.5   Comment: WHEN CK < 100 U/L             TROPONIN I     Status: None   Collection Time    06/10/12 11:12 PM      Result Value Range   Troponin I <0.30  <0.30 ng/mL   Comment:            Due to the release kinetics of cTnI,     a negative result  within the first hours     of the onset of symptoms does not rule out     myocardial infarction with certainty.     If myocardial infarction is still suspected,     repeat the test at appropriate intervals.  GLUCOSE, CAPILLARY     Status: Abnormal   Collection Time    06/11/12  7:11 AM      Result Value Range   Glucose-Capillary 120 (*) 70 - 99 mg/dL   Comment 1 Notify RN       HEENT: normal Cardio: RRR Resp: CTA B/L GI: BS positive and Distention Extremity:  Pulses positive and No Edema Skin:   Intact Neuro: Alert/Oriented, Confused but   attentive today. Doesn't realize he's in the hospital or what has happened to him. Flat, Cranial Nerve II-XII normal, Abnormal Sensory Reduced sensation on Right but also has poor attention to testing, Abnormal Motor 2-/5 in RUE and 3-/5 RLE, 4/5 LUE, 3-/5 LLE and Tone:  increased flexor tone RUE, increased extensor tone RLE Musc/Skel:  Other Old scar L antecubital fossa, non tender Gen:  NAD Cognitive: oriented to person and to hospital but not date Righ homonymous hemianopsia persistent  Assessment/Plan: 1. Functional deficits secondary to Left PCA infarct which require 3+ hours per day of interdisciplinary therapy in a comprehensive inpatient rehab setting. Physiatrist is providing close team supervision and 24 hour management of active medical problems listed below. Physiatrist and rehab team continue to assess barriers to discharge/monitor patient progress toward functional and medical goals. FIM: FIM - Bathing Bathing Steps Patient Completed: Chest;Right Arm;Abdomen;Front perineal area;Left upper leg Bathing: 3: Mod-Patient completes 5-7 92f 10 parts or 50-74%  FIM - Upper Body Dressing/Undressing Upper body dressing/undressing steps patient completed: Thread/unthread right sleeve of pullover shirt/dresss;Pull shirt over trunk Upper body dressing/undressing: 3: Mod-Patient completed 50-74% of tasks FIM - Lower Body  Dressing/Undressing Lower body dressing/undressing steps patient completed: Thread/unthread left pants leg;Pull pants up/down Lower body dressing/undressing: 2: Max-Patient completed 25-49% of tasks  FIM - Toileting Toileting: 1: Two helpers  FIM - Diplomatic Services operational officer Devices: Grab bars Toilet Transfers: 1-To toilet/BSC: Total A (helper does all/Pt. < 25%);1-From toilet/BSC: Total A (helper does all/Pt. < 25%)  FIM - Bed/Chair Transfer Bed/Chair Transfer Assistive Devices: Arm rests Bed/Chair Transfer: 1: Two helpers  FIM - Locomotion: Wheelchair Distance: 15 Locomotion: Wheelchair: 1: Total Assistance/staff pushes wheelchair (Pt<25%) FIM - Locomotion: Ambulation Locomotion: Ambulation Assistive Devices:  (shopping cart) Ambulation/Gait Assistance: 1: +2 Total assist Locomotion: Ambulation: 1: Two helpers  Comprehension Comprehension Mode: Auditory Comprehension: 3-Understands basic 50 - 74% of the time/requires cueing 25 - 50%  of the time  Expression Expression Mode: Verbal Expression: 1-Expresses basis  less than 25% of the time/requires cueing greater than 75% of the time.  Social Interaction Social Interaction: 2-Interacts appropriately 25 - 49% of time - Needs frequent redirection.  Problem Solving Problem Solving Mode: Not assessed Problem Solving: 2-Solves basic 25 - 49% of the time - needs direction more than half the time to initiate, plan or complete simple activities  Memory Memory Mode: Not assessed Memory: 1-Recognizes or recalls less than 25% of the time/requires cueing greater than 75% of the time Medical Problem List and Plan:  1. embolic left PCA infarct  2. DVT Prophylaxis/Anticoagulation: Subcutaneous Lovenox. Monitor platelet counts and any signs of bleeding Hx of Left femoral L popliteal,Left tibial subacute DVT diagnosed January 2013.lovenox 30 bid currently 3. Mood/dementia. Seroquel 25 mg twice a day, Ativan when necessary.  Patient displayed baseline cognitive deficits per family. Will expect wax and wane. 4. Neuropsych: This patient is not capable of making decisions on his/her own behalf.  5. Hyperlipidemia. Zocor  6. Diabetes mellitus with peripheral neuropathy. Hemoglobin A1c 6.7. Patient on diet control prior to admission. Check CBGs a.c. and at bedtime- fair control generally. Cover spikes with SSI for now. 7. Dysphagia. Dysphagia 1 thin liquids. Speech therapy followup. Monitor for any signs of aspiration.  8.  Leukocytosis with UTI--100k E Coli-- on  cipro day 6/7 9.  Constipation nsg to give sorbitol 10.  Spasticity tone improved on Zanaflex but too sedated,titrate baclofen, not sedated,but increased tone    LOS (Days) 12 A FACE TO FACE EVALUATION WAS PERFORMED  KIRSTEINS,ANDREW E 06/11/2012, 9:59 AM

## 2012-06-12 ENCOUNTER — Inpatient Hospital Stay (HOSPITAL_COMMUNITY): Payer: Medicare Other | Admitting: Speech Pathology

## 2012-06-12 ENCOUNTER — Inpatient Hospital Stay (HOSPITAL_COMMUNITY): Payer: Medicare Other

## 2012-06-12 ENCOUNTER — Inpatient Hospital Stay (HOSPITAL_COMMUNITY): Payer: Medicare Other | Admitting: Occupational Therapy

## 2012-06-12 DIAGNOSIS — I6330 Cerebral infarction due to thrombosis of unspecified cerebral artery: Secondary | ICD-10-CM

## 2012-06-12 DIAGNOSIS — R209 Unspecified disturbances of skin sensation: Secondary | ICD-10-CM

## 2012-06-12 DIAGNOSIS — I69998 Other sequelae following unspecified cerebrovascular disease: Secondary | ICD-10-CM

## 2012-06-12 DIAGNOSIS — G8110 Spastic hemiplegia affecting unspecified side: Secondary | ICD-10-CM

## 2012-06-12 LAB — GLUCOSE, CAPILLARY: Glucose-Capillary: 116 mg/dL — ABNORMAL HIGH (ref 70–99)

## 2012-06-12 NOTE — Progress Notes (Signed)
Patient ID: Raymond Rangel, male   DOB: July 06, 1931, 79 y.o.   MRN: [id number]  Subjective/Complaints: 79 y.o. right-handed male history of TIA, diabetes mellitus and dementia. 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 Admitted 05/26/2012 with right-sided weakness and slurred speech. MRI shows acute left PCA territory infarcts without hemorrhage. MRA of the head with 50% mid basilar stenosis. Proximal occlusion left PCA. Echocardiogram with ejection fraction 60% and grade 1 diastolic dysfunction. Carotid Dopplers with no ICA stenosis. Patient did not receive TPA. Neurology consulted patient maintained on aspirin 81 mg daily as prior to admission and Plavix added to regimen. Subcutaneous Lovenox for DVT prophylaxis. Currently patient is maintained on a dysphagia 1 thin liquid diet after modified barium swallow study 05/28/2012.Monitoring of cognition with noted history of dementia placed on Seroquel.  Incont B and B per RN Good am ADL session with OT Fluctuating tone poor sensation on R  No complaints. Pleasantly confused. Review of Systems  Review of Systems  Constitutional: Negative.   Respiratory: Positive for cough. Negative for hemoptysis, sputum production, shortness of breath and wheezing.        With meals  Gastrointestinal: Negative.   Neurological: Positive for focal weakness.  All other systems reviewed and are negative.    Objective: Vital Signs: Blood pressure 127/73, pulse 95, temperature 98.2 F (36.8 C), temperature source Oral, resp. rate 19, height 6\' 2"  (1.88 m), weight 102.6 kg (226 lb 3.1 oz), SpO2 95.00%. Dg Swallowing Func-speech Pathology  05/29/2012  [name], CCC-SLP     05/29/2012 11:55 AM   Recommend  to proceed with dysphagia 1 (puree) and thin liquids with full  assist with each meals to ensure safety.  Recommend puree mainly  due to severity of oral dysphagia.  Please note patient required  hand over hand assist for self feeding with  majority of PO trials  administered by treating SLP.  Only trace silent aspiration  occurred with nectar thick liquids and x1 with thin liquid but  patient was in optimal upright positioning.       Results for orders placed during the hospital encounter of 05/30/12 (from the past 72 hour(s))  GLUCOSE, CAPILLARY     Status: Abnormal   Collection Time    06/09/12 11:17 AM      Result Value Range   Glucose-Capillary 104 (*) 70 - 99 mg/dL  GLUCOSE, CAPILLARY     Status: None   Collection Time    06/09/12  4:46 PM      Result Value Range   Glucose-Capillary 95  70 - 99 mg/dL  GLUCOSE, CAPILLARY     Status: Abnormal   Collection Time    06/09/12  8:42 PM      Result Value Range   Glucose-Capillary 227 (*) 70 - 99 mg/dL   Comment 1 Notify RN    GLUCOSE, CAPILLARY     Status: Abnormal   Collection Time    06/10/12  7:25 AM      Result Value Range   Glucose-Capillary 100 (*) 70 - 99 mg/dL  GLUCOSE, CAPILLARY     Status: None   Collection Time    06/10/12 11:35 AM      Result Value Range   Glucose-Capillary 94  70 - 99 mg/dL  GLUCOSE, CAPILLARY     Status: None   Collection Time    06/10/12  4:49 PM      Result Value Range   Glucose-Capillary 96  70 -  99 mg/dL  CK TOTAL AND CKMB     Status: None   Collection Time    06/10/12  5:03 PM      Result Value Range   Total CK 92  7 - 232 U/L   CK, MB 2.7  0.3 - 4.0 ng/mL   Relative Index RELATIVE INDEX IS INVALID  0.0 - 2.5   Comment: WHEN CK < 100 U/L             TROPONIN I     Status: None   Collection Time    06/10/12  5:03 PM      Result Value Range   Troponin I <0.30  <0.30 ng/mL   Comment:            Due to the release kinetics of cTnI,     a negative result within the first hours     of the onset of symptoms does not rule out     myocardial infarction with certainty.     If myocardial infarction is still suspected,     repeat the test at appropriate intervals.  GLUCOSE, CAPILLARY     Status: Abnormal   Collection Time     06/10/12  9:26 PM      Result Value Range   Glucose-Capillary 109 (*) 70 - 99 mg/dL   Comment 1 Notify RN    CK TOTAL AND CKMB     Status: None   Collection Time    06/10/12 11:12 PM      Result Value Range   Total CK 97  7 - 232 U/L   CK, MB 2.5  0.3 - 4.0 ng/mL   Relative Index RELATIVE INDEX IS INVALID  0.0 - 2.5   Comment: WHEN CK < 100 U/L             TROPONIN I     Status: None   Collection Time    06/10/12 11:12 PM      Result Value Range   Troponin I <0.30  <0.30 ng/mL   Comment:            Due to the release kinetics of cTnI,     a negative result within the first hours     of the onset of symptoms does not rule out     myocardial infarction with certainty.     If myocardial infarction is still suspected,     repeat the test at appropriate intervals.  GLUCOSE, CAPILLARY     Status: Abnormal   Collection Time    06/11/12  7:11 AM      Result Value Range   Glucose-Capillary 120 (*) 70 - 99 mg/dL   Comment 1 Notify RN    GLUCOSE, CAPILLARY     Status: Abnormal   Collection Time    06/11/12 11:42 AM      Result Value Range   Glucose-Capillary 133 (*) 70 - 99 mg/dL   Comment 1 Notify RN    GLUCOSE, CAPILLARY     Status: Abnormal   Collection Time    06/11/12  4:21 PM      Result Value Range   Glucose-Capillary 113 (*) 70 - 99 mg/dL   Comment 1 Notify RN    GLUCOSE, CAPILLARY     Status: Abnormal   Collection Time    06/11/12  8:48 PM      Result Value Range   Glucose-Capillary 134 (*) 70 - 99 mg/dL  GLUCOSE, CAPILLARY  Status: Abnormal   Collection Time    06/12/12  7:13 AM      Result Value Range   Glucose-Capillary 117 (*) 70 - 99 mg/dL   Comment 1 Notify RN       HEENT: normal Cardio: RRR Resp: CTA B/L GI: BS positive and Distention Extremity:  Pulses positive and No Edema Skin:   Intact Neuro: Alert/Oriented, Confused but   attentive today. Doesn't realize he's in the hospital or what has happened to him. Flat, Cranial Nerve II-XII normal,  Abnormal Sensory Reduced sensation on Right but also has poor attention to testing, Abnormal Motor 2-/5 in RUE and 3-/5 RLE, 4/5 LUE, 3-/5 LLE and Tone:  increased flexor tone RUE, increased extensor tone RLE Musc/Skel:  Other Old scar L antecubital fossa, non tender Gen:  NAD Cognitive: oriented to person and to hospital but not date Righ homonymous hemianopsia persistent  Assessment/Plan: 1. Functional deficits secondary to Left PCA infarct which require 3+ hours per day of interdisciplinary therapy in a comprehensive inpatient rehab setting. Physiatrist is providing close team supervision and 24 hour management of active medical problems listed below. Physiatrist and rehab team continue to assess barriers to discharge/monitor patient progress toward functional and medical goals. FIM: FIM - Bathing Bathing Steps Patient Completed: Chest;Right Arm;Abdomen;Front perineal area;Right upper leg;Left upper leg;Left Arm Bathing: 3: Mod-Patient completes 5-7 44f 10 parts or 50-74%  FIM - Upper Body Dressing/Undressing Upper body dressing/undressing steps patient completed: Pull shirt over trunk;Put head through opening of pull over shirt/dress;Thread/unthread left sleeve of pullover shirt/dress Upper body dressing/undressing: 4: Min-Patient completed 75 plus % of tasks FIM - Lower Body Dressing/Undressing Lower body dressing/undressing steps patient completed: Thread/unthread left pants leg Lower body dressing/undressing: 2: Max-Patient completed 25-49% of tasks  FIM - Toileting Toileting: 1: Two helpers  FIM - Diplomatic Services operational officer Devices: Therapist, music Transfers: 0-Activity did not occur  FIM - Banker Devices: Arm rests Bed/Chair Transfer: 4: Supine > Sit: Min A (steadying Pt. > 75%/lift 1 leg);3: Bed > Chair or W/C: Mod A (lift or lower assist)  FIM - Locomotion: Wheelchair Distance: 15 Locomotion: Wheelchair: 1: Total  Assistance/staff pushes wheelchair (Pt<25%) FIM - Locomotion: Ambulation Locomotion: Ambulation Assistive Devices: Other (comment) (L rail in hallway) Ambulation/Gait Assistance: 1: +2 Total assist Locomotion: Ambulation: 1: Two helpers  Comprehension Comprehension Mode: Auditory Comprehension: 3-Understands basic 50 - 74% of the time/requires cueing 25 - 50%  of the time  Expression Expression Mode: Verbal Expression: 2-Expresses basic 25 - 49% of the time/requires cueing 50 - 75% of the time. Uses single words/gestures.  Social Interaction Social Interaction: 2-Interacts appropriately 25 - 49% of time - Needs frequent redirection.  Problem Solving Problem Solving Mode: Not assessed Problem Solving: 2-Solves basic 25 - 49% of the time - needs direction more than half the time to initiate, plan or complete simple activities  Memory Memory Mode: Not assessed Memory: 1-Recognizes or recalls less than 25% of the time/requires cueing greater than 75% of the time Medical Problem List and Plan:  1. embolic left PCA infarct  2. DVT Prophylaxis/Anticoagulation: Subcutaneous Lovenox. Monitor platelet counts and any signs of bleeding Hx of Left femoral L popliteal,Left tibial subacute DVT diagnosed January 2013.lovenox 30 bid currently 3. Mood/dementia. Seroquel 25 mg twice a day, Ativan when necessary. Patient displayed baseline cognitive deficits per family. Will expect wax and wane. 4. Neuropsych: This patient is not capable of making decisions on his/her own behalf.  5. Hyperlipidemia. Zocor  6. Diabetes mellitus with peripheral neuropathy. Hemoglobin A1c 6.7. Patient on diet control prior to admission. Check CBGs a.c. and at bedtime- fair control generally. Cover spikes with SSI for now. 7. Dysphagia. Dysphagia 1 thin liquids. Speech therapy followup. Monitor for any signs of aspiration.  8. UTI resolved 9.  Constipation nsg to give sorbitol 10.  Spasticity tone improved on Zanaflex but  too sedated,titrate baclofen, not sedated,but increased tone    LOS (Days) 13 A FACE TO FACE EVALUATION WAS PERFORMED  KIRSTEINS,ANDREW E 06/12/2012, 9:00 AM

## 2012-06-12 NOTE — Progress Notes (Signed)
Occupational Therapy Note  Patient Details  Name: Raymond Rangel MRN: [id number] Date of Birth: Oct 15, 1931 Today's Date: 06/12/2012  Time: 1130-1225 (55 min) Pt seen for self-feeding session with focus on increased attention to Rt and use of RUE for self-feeding.  Use of angled adaptive spoon to increase independence, but pt required max cues and hand over hand assist for use of adaptive utensil.  Verbal cues for pt to drink to have liquid wash to manage dys. 2 textures.  Pt with increased confusion and frustration with self-feeding, this clinician then attempted to have pt hold container with Rt hand while self-feeding with spoon with left hand however pt brought container to mouth to attempt to drink.  Pt with increased distraction this session with multiple visitors.  Pt with no c/o pain this session.  Leonette Monarch 06/12/2012, 12:27 PM

## 2012-06-12 NOTE — Progress Notes (Signed)
Occupational Therapy Session Note  Patient Details  Name: Raymond Rangel MRN: [id number] Date of Birth: Aug 09, 1931  Today's Date: 06/12/2012 Time: 0800-0900 Time Calculation (min): 60 min  Short Term Goals: Week 1:  OT Short Term Goal 1 (Week 1): Pt will roll in bed with mod assist to assist caregivers with clothing changes. OT Short Term Goal 1 - Progress (Week 1): Progressing toward goal OT Short Term Goal 2 (Week 1): Pt will sit on EOB with mod assist to engage in grooming tasks. OT Short Term Goal 2 - Progress (Week 1): Met OT Short Term Goal 3 (Week 1): Pt will don shirt with max assist. OT Short Term Goal 3 - Progress (Week 1): Met Week 2:  OT Short Term Goal 1 (Week 2): Pt will consistently transfer to the toilet using a squat pivot transfer with max assist x1. OT Short Term Goal 2 (Week 2): Pt will sit to stand from toilet and maintain standing with mod assist. OT Short Term Goal 3 (Week 2): Pt will demonstrate improved RUE motor control by bathing left arm with min assist.      Skilled Therapeutic Interventions/Progress Updates:       Pt seen for BADL retraining of bathing and dressing with a focus on sitting balance, sit to stand at sink, attention, focus on right side of body, and following 1 step directions.  Pt did extremely well this am with following 1 step directions and demonstrated more fluid movement. From sidelying, he sat to EOB with min assist, scooted to right on bed and transferred to w/c to right with only min assist x 1. This is significant progress today, consistency has been an issue. He was also able to don a tshirt with only min assist.  He reached down to both ankles in sitting position to wash legs. To assist with transfer, right leg was positioned into flexion and blocked by therapist. Gentle physical cues for forward trunk flexion and cued patient to make small scoots.  For sit to stand, a trash can was place adjacent to LLE. Pt told to not move trash can as he  always extends and pushes LLE out to side with standing to help have a sense of balance.  Pt only pushed the can slightly and was able to stand with mod assist with UE support on sink. At end of session, pt left in w/c with quick release belt and call light in place.  Therapy Documentation Precautions:  Precautions Precautions: Fall Precaution Comments: increased tone right side (extensor in leg, flexor in arm); dementai Restrictions Weight Bearing Restrictions: No  Pain: Pain Assessment Pain Assessment: No/denies pain ADL:  See FIM for current functional status  Therapy/Group: Individual Therapy  SAGUIER,JULIA 06/12/2012, 10:42 AM

## 2012-06-12 NOTE — Progress Notes (Signed)
Speech Language Pathology Daily Session Note  Patient Details  Name: Raymond Rangel MRN: [id number] Date of Birth: May 02, 1931  Today's Date: 06/12/2012 Time: 1130-1225 Time Calculation (min): 55 min  Short Term Goals: Week 2: SLP Short Term Goal 1 (Week 2): Patient will follow 1 step directions during functional, familiar tasks with mod assist verbal and visual cues. SLP Short Term Goal 1 - Progress (Week 2): Progressing toward goal SLP Short Term Goal 2 (Week 2): Patient will demonstrate basic problem solving during functional, familiar tasks with mod assist verbal and visual cues. SLP Short Term Goal 2 - Progress (Week 2): Progressing toward goal SLP Short Term Goal 3 (Week 2): Patient will consume Dys.1 textures and thin liquid with min assist verbal, visual and tactile cues with no overt s/s of aspiration.  SLP Short Term Goal 3 - Progress (Week 2): Met  Skilled Therapeutic Interventions: Skilled treatment session focused on addressing dysphagia and cognitive goals.  Patient required total assist for orientation today; unable to utilize any external aid written or numerical.  SLP facilitated session with trials of regular textures and thin liquids via cup.  Patient required max assist question cues to problem solve and request help with set up.  Patient consumed cracker with prolonged mastication and trace oral residue as well as max faded to min assist cues to alternate sips to reduce residue.  SLP recommends upgrade texture to Dys.2 textures and continuation of thin liquids and full supervision.    FIM:  Comprehension Comprehension Mode: Auditory Comprehension: 3-Understands basic 50 - 74% of the time/requires cueing 25 - 50%  of the time Expression Expression Mode: Verbal Expression: 2-Expresses basic 25 - 49% of the time/requires cueing 50 - 75% of the time. Uses single words/gestures. Social Interaction Social Interaction: 2-Interacts appropriately 25 - 49% of time - Needs frequent  redirection. Problem Solving Problem Solving: 2-Solves basic 25 - 49% of the time - needs direction more than half the time to initiate, plan or complete simple activities Memory Memory: 1-Recognizes or recalls less than 25% of the time/requires cueing greater than 75% of the time FIM - Eating Eating Activity: 2: Hand over hand assist  Pain Pain Assessment Pain Assessment: No/denies pain  Therapy/Group: Individual Therapy  Charlane Ferretti., CCC-SLP [phone]  [name] 06/12/2012, 12:34 PM

## 2012-06-12 NOTE — Progress Notes (Signed)
Occupational Therapy Session Note  Patient Details  Name: Raymond Rangel MRN: [id number] Date of Birth: 1931/05/13  Today's Date: 06/12/2012 Time: 1030-1100 (60 min co-treat with PT) Time Calculation (min): 30 min  Short Term Goals: Week 2:  OT Short Term Goal 1 (Week 2): Pt will consistently transfer to the toilet using a squat pivot transfer with max assist x1. OT Short Term Goal 2 (Week 2): Pt will sit to stand from toilet and maintain standing with mod assist. OT Short Term Goal 3 (Week 2): Pt will demonstrate improved RUE motor control by bathing left arm with min assist.  Skilled Therapeutic Interventions/Progress Updates:  Focus session on activity tolerance, sit><stands, lateral weight shifts while standing and while ambulating, postural control, and midline orientation.  Patient requires facilitation for postural control and to shift weight left to allow RLE to swing through.  Patient's RLE with significant extensor tone and scissors when brings foot forward resulting in hip circumduction.  Attempted to facilitate bigger steps with LLE to trigger RLE hip and knee flexion with minimal success.  Patient had ability to demonstrate more automatic movement with ambulation.  Patient up and down 3 steps with railing and again demonstrated more automatic movements to include simultaneously reaching with RUE for rail during transitions and when going down the stairs, patient initiate RLE knee flexion ~2 times.  See PT progress note for more details.  Therapy Documentation Precautions:  Precautions Precautions: Fall Precaution Comments: increased tone on right side (extensor in leg and flexor in arm), decreased midline orientation, dementia Restrictions Weight Bearing Restrictions: No Pain: Pain Assessment Pain Assessment: No/denies pain  Therapy/Group: Co-Treatment with PT  SHAFFER, CHRISTINA 06/12/2012, 4:12 PM

## 2012-06-12 NOTE — Progress Notes (Signed)
Physical Therapy Note  Patient Details  Name: Raymond Rangel MRN: [id number] Date of Birth: January 31, 1932 Today's Date: 06/12/2012  10 - 10:30 30 minutes (co-treatment with OT for an hour) Patient denies pain.  Patient more alert sitting in wheelchair upon entering room. Patient pushed to gym in wheelchair. Patient sit to stand with +1 mod assist. Patient ambulated with +2 assist 70 feet, 120 feet and 70 feet. Patient using right medium toe off brace to assist with toe clearance. Patient requires manual facilitation to weight shift to left in order to progress right LE forward. Patient was able to progress right foot forward with step to pattern - right LE remains in extension and patient circumducts it forward. Patient has narrow base of support as he tends to scissor with right LE. Patient worked on standing and weight shifting to left in squat position. Patient up and down 3 steps with 1 railing and +2 assist. Patient reports he is fearful of falling and he tends to push to left. Patient left in room in wheelchair with quick release belt in place and all items within reach.   Arelia Longest M 06/12/2012, 12:08 PM

## 2012-06-13 ENCOUNTER — Inpatient Hospital Stay (HOSPITAL_COMMUNITY): Payer: Medicare Other | Admitting: Speech Pathology

## 2012-06-13 ENCOUNTER — Inpatient Hospital Stay (HOSPITAL_COMMUNITY): Payer: Medicare Other

## 2012-06-13 LAB — CREATININE, SERUM: GFR calc Af Amer: >90 mL/min (ref >90)

## 2012-06-13 MED ORDER — DONEPEZIL HCL 5 MG PO TABS
5.0000 mg | ORAL_TABLET | Freq: Every day | ORAL | Status: DC
  Administered 2012-06-13 – 2012-06-24 (×12): 5 mg via ORAL
  Filled 2012-06-13 (×13): qty 1

## 2012-06-13 MED ORDER — BACLOFEN 5 MG HALF TABLET
5.0000 mg | ORAL_TABLET | Freq: Four times a day (QID) | ORAL | Status: DC
  Administered 2012-06-13 – 2012-06-17 (×15): 5 mg via ORAL
  Filled 2012-06-13 (×20): qty 1

## 2012-06-13 NOTE — Patient Care Conference (Signed)
Inpatient RehabilitationTeam Conference and Plan of Care Update Date: 06/13/2012   Time: 10:30 AM    Patient Name: Raymond Rangel      Medical Record Number: [id number]  Date of Birth: 1931-11-12 Sex: Male         Room/Bed: 4037/4037-01 Payor Info: Payor: [employer]vertising copy[employer] MEDICARE  Plan: AARP MEDICARE COMPLETE  Product Type: *No Product type*     Admitting Diagnosis: LT CVA  Admit Date/Time:  05/30/2012  5:01 PM Admission Comments: No comment available   Primary Diagnosis:  CVA (cerebral infarction) Principal Problem: CVA (cerebral infarction)  Patient Active Problem List   Diagnosis Date Noted  . Heat stroke 05/31/2012  . Homonymous hemianopsia due to recent stroke 05/31/2012  . CVA (cerebral infarction) 05/26/2012  . Diabetes mellitus, type 2 07/29/2011  . Right shoulder pain 05/02/2011  . DVT (deep venous thrombosis) 05/02/2011  . CAD (coronary artery disease) 05/02/2011  . Hyperlipidemia 05/02/2011  . Dementia 05/02/2011    Expected Discharge Date: Expected Discharge Date: 06/20/12  Team Members Present: Physician leading conference: Dr. Claudette Laws Social Worker Present: Dossie Der, LCSW Nurse Present: Gregor Hams, RN PT Present: Edman Circle, PT;Other (comment) Clarisse Gouge Ripa_PT) OT Present: Leonette Monarch, Felipa Eth, OT SLP Present: Fae Pippin, SLP Other (Discipline and Name): Charolette Child Coordinator     Current Status/Progress Goal Weekly Team Focus  Medical   Increased tone right upper extremity, spasticity medication causing excessive sedation, chronic moderately severe dementia  Improve right-sided muscle tone, avoid sedation  Titrate baclofen   Bowel/Bladder   pt incontinent of B/B. Uses diaper, condom cath at [facility].   continent of B+B  timed toileting during day condom cath at [facility]   Swallow/Nutrition/ Hydration   Dys.2 textures and thin liquids with full supervision/ max assist  least restrictive p.o. intake  carryover of self feeding  strategies and Dys.3 trials   ADL's   min assist to don tshirt, mod with button up shirt; max LB dressing;  mod assist with bathing; mod assist with transfers - pt is improving, but still waxes and wanes at times.  mod assist with self care and transfers  ADL retraining, functional mobility, RUE coordination, family education   Mobility   max assist transfers; +2 for gait  moderate assist basic transfers and gait x 50' and up/down 3 steps  activity tolerance, transfers, balance, midline orientation, family education, gait   Communication   min-mod asssist  min assist   increase initiation    Safety/Cognition/ Behavioral Observations  max assist  mod assist  increase initiation, sustained attention and carryover of daily routine/information; family education   Pain   pt denies any pain or discomfort hx- dementia tylenol 650mg  po prn  less than equal to 3.   will continue to assess/monitor for pain medicate as needed.   Skin   CDI   will remain CDI free of skin infection/breakdown.  monitor skin q shift.       *See Interdisciplinary Assessment and Plan and progress notes for long and short-term goals  Barriers to Discharge: See above    Possible Resolutions to Barriers:  See above    Discharge Planning/Teaching Needs:  Home with family providing 24 hr care-if can manage.  daughter's have been in and observed in therapies, somewhat unrealistic regarding pt's progress      Team Discussion:  Switched meds for tone-see if make a difference.  Started aricept.  Making slow progress, dementia playing a role in progress.  Family education on-going  Revisions to Treatment Plan:  None   Continued Need for Acute Rehabilitation Level of Care: The patient requires daily medical management by a physician with specialized training in physical medicine and rehabilitation for the following conditions: Daily direction of a multidisciplinary physical rehabilitation program to ensure safe treatment  while eliciting the highest outcome that is of practical value to the patient.: Yes Daily medical management of patient stability for increased activity during participation in an intensive rehabilitation regime.: Yes Daily analysis of laboratory values and/or radiology reports with any subsequent need for medication adjustment of medical intervention for : Neurological problems;Other  Dupree, Lemar Livings 06/13/2012, 1:30 PM

## 2012-06-13 NOTE — Progress Notes (Signed)
Occupational Therapy Session Note  Patient Details  Name: Raymond Rangel MRN: [id number] Date of Birth: 1931-04-23  Today's Date: 06/13/2012 Time: 1000-1055 Time Calculation (min): 55 min  Short Term Goals: Week 2:  OT Short Term Goal 1 (Week 2): Pt will consistently transfer to the toilet using a squat pivot transfer with max assist x1. OT Short Term Goal 2 (Week 2): Pt will sit to stand from toilet and maintain standing with mod assist. OT Short Term Goal 3 (Week 2): Pt will demonstrate improved RUE motor control by bathing left arm with min assist.  Skilled Therapeutic Interventions/Progress Updates:    Pt seated in w/c upon arrival.  Pt required tot A for orientation this morning.  Pt engaged in ADL retraining including bathing and dressing with sit<>stand from w/c at sink.  Pt required max verbal cues to initiate all tasks and occasional tactile cues.  When accepted t-shirt to don when presented and verbally agreed but did not initiate task until provided with gestures.  When performing sit<>stand therapist blocks right leg to prevent knee extension.  Pt requires min A with knee blocked.  Focus on attention to task, task initiation, RUE use, sit<>stand, standing balance, orientation, and safety awareness.  Therapy Documentation Precautions:  Precautions Precautions: Fall Precaution Comments: increased tone on right side (extensor in leg and flexor in arm), decreased midline orientation, dementia Restrictions Weight Bearing Restrictions: No General:   Vital Signs:   Pain: Pain Assessment Pain Assessment: No/denies pain Pain Score: 0-No pain  See FIM for current functional status  Therapy/Group: Individual Therapy  Rich Brave 06/13/2012, 10:56 AM

## 2012-06-13 NOTE — Progress Notes (Signed)
Occupational Therapy Note  Patient Details  Name: Raymond Rangel MRN: [id number] Date of Birth: Jun 01, 1931 Today's Date: 06/13/2012  Time: 1145-1200 (cotx with Speech Therapy-total time 1130-1200) Pt denies pain Group Therapy Pt participated in self feeding group with focus on self feeding with LUE and PO intake.  Pt continually stated he was not hungry and attempts to have pt self feed were unsuccessful.  Pt did take approx 5 bites of meal when food scooped and brought to patient's mouth.   Lavone Neri [facility] 06/13/2012, 2:52 PM

## 2012-06-13 NOTE — Progress Notes (Signed)
Speech Language Pathology Daily Session Note  Patient Details  Name: Raymond Rangel MRN: [id number] Date of Birth: [birth year]  Today's Date: 06/13/2012 Time: 4098-1191 Time Calculation (min): 45 min  Short Term Goals: Week 2: SLP Short Term Goal 1 (Week 2): Patient will follow 1 step directions during functional, familiar tasks with mod assist verbal and visual cues. SLP Short Term Goal 1 - Progress (Week 2): Progressing toward goal SLP Short Term Goal 2 (Week 2): Patient will demonstrate basic problem solving during functional, familiar tasks with mod assist verbal and visual cues. SLP Short Term Goal 2 - Progress (Week 2): Progressing toward goal SLP Short Term Goal 3 (Week 2): Patient will consume Dys.1 textures and thin liquid with min assist verbal, visual and tactile cues with no overt s/s of aspiration.  SLP Short Term Goal 3 - Progress (Week 2): Met SLP Short Term Goal 4 (Week 2): Patient will consume Dys.2 textures and thin liquid with min assist verbal, visual and tactile cues with no overt s/s of aspiration.   Skilled Therapeutic Interventions: Skilled treatment session focused on addressing dysphagia and cognitive goals during a self care task.  Patient required total assist for orientation today; stated "I am trying to put this together." SLP facilitated session with set-up of breakfast tray and max assist verbal cuese to attend to self-feeding.  SLP also facilitaed session with min-mod assist verbal and tactile cues to problem solve self-feeding Dys.2 textures and thin liquids with no overt s/s of aspiration.  Continue with current plan of care.   FIM:  Comprehension Comprehension Mode: Auditory Comprehension: 3-Understands basic 50 - 74% of the time/requires cueing 25 - 50%  of the time Expression Expression Mode: Verbal Expression: 3-Expresses basic 50 - 74% of the time/requires cueing 25 - 50% of the time. Needs to repeat parts of sentences. Social Interaction Social  Interaction: 2-Interacts appropriately 25 - 49% of time - Needs frequent redirection. Problem Solving Problem Solving: 2-Solves basic 25 - 49% of the time - needs direction more than half the time to initiate, plan or complete simple activities Memory Memory: 1-Recognizes or recalls less than 25% of the time/requires cueing greater than 75% of the time FIM - Eating Eating Activity: 4: Helper occasionally scoops food on utensil;4: Helper checks for pocketed food;4: Help with managing cup/glass  Pain Pain Assessment Pain Assessment: No/denies pain Pain Score: 0-No pain  Therapy/Group: Individual Therapy  Charlane Ferretti., CCC-SLP [phone]  [name],[name] 06/13/2012, 9:24 AM

## 2012-06-13 NOTE — Progress Notes (Signed)
Speech Language Pathology Daily Session Note  Patient Details  Name: Raymond Rangel MRN: [id number] Date of Birth: June 06, 1931  Today's Date: 06/13/2012 Time: 1130-1145 Time Calculation (min): 15 min  Short Term Goals: Week 2: SLP Short Term Goal 1 (Week 2): Patient will follow 1 step directions during functional, familiar tasks with mod assist verbal and visual cues. SLP Short Term Goal 1 - Progress (Week 2): Progressing toward goal SLP Short Term Goal 2 (Week 2): Patient will demonstrate basic problem solving during functional, familiar tasks with mod assist verbal and visual cues. SLP Short Term Goal 2 - Progress (Week 2): Progressing toward goal SLP Short Term Goal 3 (Week 2): Patient will consume Dys.1 textures and thin liquid with min assist verbal, visual and tactile cues with no overt s/s of aspiration.  SLP Short Term Goal 3 - Progress (Week 2): Met SLP Short Term Goal 4 (Week 2): Patient will consume Dys.2 textures and thin liquid with min assist verbal, visual and tactile cues with no overt s/s of aspiration.   Skilled Therapeutic Interventions: Pt participated in a co-treatment with OT in [place] with focus on self-feeding and dysphagia goals. Pt required total A for self-feeding and PO intake. Pt continually stated he was not hungry and consumed ~ 5 bites of his meal that were fed to him by the clinician. Pt without overt s/s of aspiration but required total A visual and verbal cues to utilize liquid washes to clear oral residue.    FIM:  Comprehension Comprehension Mode: Auditory Comprehension: 3-Understands basic 50 - 74% of the time/requires cueing 25 - 50%  of the time Expression Expression Mode: Verbal Expression: 3-Expresses basic 50 - 74% of the time/requires cueing 25 - 50% of the time. Needs to repeat parts of sentences. Social Interaction Social Interaction: 2-Interacts appropriately 25 - 49% of time - Needs frequent redirection. Problem Solving Problem Solving:  1-Solves basic less than 25% of the time - needs direction nearly all the time or does not effectively solve problems and may need a restraint for safety Memory Memory: 1-Recognizes or recalls less than 25% of the time/requires cueing greater than 75% of the time FIM - Eating Eating Activity: 1: Helper feeds patient  Pain Pain Assessment Pain Assessment: No/denies pain  Therapy/Group: Group Therapy  PAYNE, COURTNEY 06/13/2012, 4:07 PM

## 2012-06-13 NOTE — Progress Notes (Signed)
Patient ID: Raymond Rangel, male   DOB: 15-Jun-1931, 79 y.o.   MRN: [id number]  Subjective/Complaints: 80 y.o. right-handed male history of TIA, diabetes mellitus and dementia. 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 Admitted 05/26/2012 with right-sided weakness and slurred speech. MRI shows acute left PCA territory infarcts without hemorrhage. MRA of the head with 50% mid basilar stenosis. Proximal occlusion left PCA. Echocardiogram with ejection fraction 60% and grade 1 diastolic dysfunction. Carotid Dopplers with no ICA stenosis. Patient did not receive TPA. Neurology consulted patient maintained on aspirin 81 mg daily as prior to admission and Plavix added to regimen. Subcutaneous Lovenox for DVT prophylaxis. Currently patient is maintained on a dysphagia 1 thin liquid diet after modified barium swallow study 05/28/2012.Monitoring of cognition with noted history of dementia placed on Seroquel.  Incont B and B per RN Per PT still has increased tone but no lethargy  No complaints. Pleasantly confused. Review of Systems  Review of Systems  Constitutional: Negative.   Respiratory: Positive for cough. Negative for hemoptysis, sputum production, shortness of breath and wheezing.        With meals  Gastrointestinal: Negative.   Neurological: Positive for focal weakness.  All other systems reviewed and are negative.    Objective: Vital Signs: Blood pressure 129/78, pulse 63, temperature 97.6 F (36.4 C), temperature source Oral, resp. rate 19, height 6\' 2"  (1.88 m), weight 102.6 kg (226 lb 3.1 oz), SpO2 95.00%. Dg Swallowing Func-speech Pathology  05/29/2012  Raymond Rangel, CCC-SLP     05/29/2012 11:55 AM   Recommend  to proceed with dysphagia 1 (puree) and thin liquids with full  assist with each meals to ensure safety.  Recommend puree mainly  due to severity of oral dysphagia.  Please note patient required  hand over hand assist for self feeding with majority of PO  trials  administered by treating SLP.  Only trace silent aspiration  occurred with nectar thick liquids and x1 with thin liquid but  patient was in optimal upright positioning.       Results for orders placed during the hospital encounter of 05/30/12 (from the past 72 hour(s))  GLUCOSE, CAPILLARY     Status: None   Collection Time    06/10/12 11:35 AM      Result Value Range   Glucose-Capillary 94  70 - 99 mg/dL  GLUCOSE, CAPILLARY     Status: None   Collection Time    06/10/12  4:49 PM      Result Value Range   Glucose-Capillary 96  70 - 99 mg/dL  CK TOTAL AND CKMB     Status: None   Collection Time    06/10/12  5:03 PM      Result Value Range   Total CK 92  7 - 232 U/L   CK, MB 2.7  0.3 - 4.0 ng/mL   Relative Index RELATIVE INDEX IS INVALID  0.0 - 2.5   Comment: WHEN CK < 100 U/L             TROPONIN I     Status: None   Collection Time    06/10/12  5:03 PM      Result Value Range   Troponin I <0.30  <0.30 ng/mL   Comment:            Due to the release kinetics of cTnI,     a negative result within the first hours     of the  onset of symptoms does not rule out     myocardial infarction with certainty.     If myocardial infarction is still suspected,     repeat the test at appropriate intervals.  GLUCOSE, CAPILLARY     Status: Abnormal   Collection Time    06/10/12  9:26 PM      Result Value Range   Glucose-Capillary 109 (*) 70 - 99 mg/dL   Comment 1 Notify RN    CK TOTAL AND CKMB     Status: None   Collection Time    06/10/12 11:12 PM      Result Value Range   Total CK 97  7 - 232 U/L   CK, MB 2.5  0.3 - 4.0 ng/mL   Relative Index RELATIVE INDEX IS INVALID  0.0 - 2.5   Comment: WHEN CK < 100 U/L             TROPONIN I     Status: None   Collection Time    06/10/12 11:12 PM      Result Value Range   Troponin I <0.30  <0.30 ng/mL   Comment:            Due to the release kinetics of cTnI,     a negative result within the first hours     of the onset of symptoms  does not rule out     myocardial infarction with certainty.     If myocardial infarction is still suspected,     repeat the test at appropriate intervals.  GLUCOSE, CAPILLARY     Status: Abnormal   Collection Time    06/11/12  7:11 AM      Result Value Range   Glucose-Capillary 120 (*) 70 - 99 mg/dL   Comment 1 Notify RN    GLUCOSE, CAPILLARY     Status: Abnormal   Collection Time    06/11/12 11:42 AM      Result Value Range   Glucose-Capillary 133 (*) 70 - 99 mg/dL   Comment 1 Notify RN    GLUCOSE, CAPILLARY     Status: Abnormal   Collection Time    06/11/12  4:21 PM      Result Value Range   Glucose-Capillary 113 (*) 70 - 99 mg/dL   Comment 1 Notify RN    GLUCOSE, CAPILLARY     Status: Abnormal   Collection Time    06/11/12  8:48 PM      Result Value Range   Glucose-Capillary 134 (*) 70 - 99 mg/dL  GLUCOSE, CAPILLARY     Status: Abnormal   Collection Time    06/12/12  7:13 AM      Result Value Range   Glucose-Capillary 117 (*) 70 - 99 mg/dL   Comment 1 Notify RN    GLUCOSE, CAPILLARY     Status: Abnormal   Collection Time    06/12/12 11:17 AM      Result Value Range   Glucose-Capillary 116 (*) 70 - 99 mg/dL   Comment 1 Notify RN       HEENT: normal Cardio: RRR Resp: CTA B/L GI: BS positive and Distention Extremity:  Pulses positive and No Edema Skin:   Intact Neuro: Alert/Oriented, Confused but   attentive today. Doesn't realize he's in the hospital or what has happened to him. Flat, Cranial Nerve II-XII normal, Abnormal Sensory Reduced sensation on Right but also has poor attention to testing, Abnormal Motor 2-/5 in RUE and  3-/5 RLE, 4/5 LUE, 3-/5 LLE and Tone:  increased flexor tone RUE, increased extensor tone RLE Musc/Skel:  Other Old scar L antecubital fossa, non tender Gen:  NAD Cognitive: oriented to person and to hospital but not date Righ homonymous hemianopsia persistent  Assessment/Plan: 1. Functional deficits secondary to Left PCA infarct which  require 3+ hours per day of interdisciplinary therapy in a comprehensive inpatient rehab setting. Physiatrist is providing close team supervision and 24 hour management of active medical problems listed below. Physiatrist and rehab team continue to assess barriers to discharge/monitor patient progress toward functional and medical goals. FIM: FIM - Bathing Bathing Steps Patient Completed: Chest;Right Arm;Abdomen;Front perineal area;Right upper leg;Left upper leg;Left Arm Bathing: 3: Mod-Patient completes 5-7 86f 10 parts or 50-74%  FIM - Upper Body Dressing/Undressing Upper body dressing/undressing steps patient completed: Pull shirt over trunk;Put head through opening of pull over shirt/dress;Thread/unthread left sleeve of pullover shirt/dress Upper body dressing/undressing: 4: Min-Patient completed 75 plus % of tasks FIM - Lower Body Dressing/Undressing Lower body dressing/undressing steps patient completed: Thread/unthread left pants leg Lower body dressing/undressing: 2: Max-Patient completed 25-49% of tasks  FIM - Toileting Toileting: 1: Two helpers  FIM - Diplomatic Services operational officer Devices: Therapist, music Transfers: 0-Activity did not occur  FIM - Banker Devices: Arm rests Bed/Chair Transfer: 4: Supine > Sit: Min A (steadying Pt. > 75%/lift 1 leg);3: Bed > Chair or W/C: Mod A (lift or lower assist)  FIM - Locomotion: Wheelchair Distance: 15 Locomotion: Wheelchair: 1: Total Assistance/staff pushes wheelchair (Pt<25%) FIM - Locomotion: Ambulation Locomotion: Ambulation Assistive Devices: Other (comment) (L rail in hallway) Ambulation/Gait Assistance: 1: +2 Total assist Locomotion: Ambulation: 1: Two helpers  Comprehension Comprehension Mode: Auditory Comprehension: 3-Understands basic 50 - 74% of the time/requires cueing 25 - 50%  of the time  Expression Expression Mode: Verbal Expression: 2-Expresses basic 25 -  49% of the time/requires cueing 50 - 75% of the time. Uses single words/gestures.  Social Interaction Social Interaction: 2-Interacts appropriately 25 - 49% of time - Needs frequent redirection.  Problem Solving Problem Solving Mode: Not assessed Problem Solving: 2-Solves basic 25 - 49% of the time - needs direction more than half the time to initiate, plan or complete simple activities  Memory Memory Mode: Not assessed Memory: 1-Recognizes or recalls less than 25% of the time/requires cueing greater than 75% of the time Medical Problem List and Plan:  1. embolic left PCA infarct  2. DVT Prophylaxis/Anticoagulation: Subcutaneous Lovenox. Monitor platelet counts and any signs of bleeding Hx of Left femoral L popliteal,Left tibial subacute DVT diagnosed January 2013.lovenox 30 bid currently 3. Mood/dementia. Seroquel 25 mg twice a day, Ativan when necessary. Patient displayed baseline cognitive deficits per family. Will expect wax and wane.Trial Aricept 4. Neuropsych: This patient is not capable of making decisions on his/her own behalf.  5. Hyperlipidemia. Zocor  6. Diabetes mellitus with peripheral neuropathy. Hemoglobin A1c 6.7. Patient on diet control prior to admission. Check CBGs a.c. and at bedtime- fair control generally. Cover spikes with SSI for now. 7. Dysphagia. Dysphagia 1 thin liquids. Speech therapy followup. Monitor for any signs of aspiration.  8. UTI resolved 9.  Constipation nsg to give sorbitol 10.  Spasticity tone improved on Zanaflex but too sedated,titrate baclofen, not sedated,but increased tone    LOS (Days) 14 A FACE TO FACE EVALUATION WAS PERFORMED  KIRSTEINS,ANDREW E 06/13/2012, 8:20 AM

## 2012-06-13 NOTE — Progress Notes (Signed)
Physical Therapy Weekly Progress Note  Patient Details  Name: Raymond Rangel MRN: [id number] Date of Birth: 07/02/1931  Today's Date: 06/13/2012 Time:0802-0830,  0915-1000 Time Calculation (min): 28 min and 45 min  Patient has met 1 of 4 short term goals.  He is progressing toward bed mobility and transfer goals, but is inconsistent with performance.  Patient continues to demonstrate the following deficits: cognition, R sided hypertonus, visual/perceptual, sensory, and fear; these  limit his functional mobility.  RLE hypertonus includes + support reaction (rigidity when ball of foot is in contact with floor), toe clawing, and inability to passively flex R knee to initiate swing phase.  Baclofen has been increased to QID.  Pt will therefore continue to benefit from skilled PT intervention to enhance overall performance with activity tolerance, balance, postural control, functional use of  right upper extremity and right lower extremity, attention and awareness.  Patient progressing toward long term goals..  Continue plan of care.  PT Short Term Goals  Week 2:  PT Short Term Goal 1 (Week 2): Pt will move sidelying > sit with min assist 50% of attempts. PT Short Term Goal 1 - Progress (Week 2): Partly met PT Short Term Goal 2 (Week 2): Pt will perform transfer bed>< w/c to R with mod assist 50% of attempts. PT Short Term Goal 2 - Progress (Week 2): Partly met PT Short Term Goal 3 (Week 2): Pt will perform gait x 25' with LRAD with assist of 1 person. PT Short Term Goal 3 - Progress (Week 2): Not met PT Short Term Goal 4 (Week 2): Pt will stand x 5 minutes during functional task with max assist. PT Short Term Goal 4 - Progress (Week 2): Met     Skilled Therapeutic Interventions/Progress Updates:  Pt alert and oriented to self only, both tx sessions.  After hearing orientation info, but was unable to recall it 30 seconds later.  1st tx- Bed mobility with min assist, bed rail.  Stand pivot  transfer to R bed> w/c with mod assist; pt relaxed and not pushing to R.   neuromuscular re-education via manual cues, demo, VCs for: -Attempted gait with hemiwalker on L, +2 assist, but pt's L foot continually within hemiwalker legs. -gait with +2 HHA x 120', including turn to R;  focusing on upright posture, wt shift to L.  With assistance to wt shift to L, pt able to progress RLE forward 90% of time.    2nd tx- neuromuscular re-education as above in ADL apt: - sit> stand at sink working on forward wt shift -hand washing at sink incorporating R hand with min assist -standing x 17 minutes while leaning against counter, min assist for balance while sorting food boxes and cans from cabinet, focusing on visual scanning to R and up, functional use of R hand with max assist  -sidestepping L and R at counter, mod assist to L, max assist to R -Kinetron in sitting, focusing on alternating L and RLE movements in mass flexion, foot boxes set in slight ankle DF  Therapy Documentation Precautions:  Precautions Precautions: Fall Precaution Comments: increased tone on right side (extensor in leg and flexor in arm), decreased midline orientation, dementia Restrictions Weight Bearing Restrictions: No       See FIM for current functional status  Therapy/Group: Individual Therapy  Raymond Rangel 06/13/2012, 3:35 PM

## 2012-06-13 NOTE — Progress Notes (Signed)
NUTRITION FOLLOW UP  DOCUMENTATION CODES  Per approved criteria   -Non-severe malnutrition in the context of chronic illness    Intervention:   1. Continue Ensure Complete po BID, each supplement provides 350 kcal and 13 grams of protein. 2. Please enter correct weight on patient - discussed with RN 3. RD to continue to follow nutrition care plan  Nutrition Dx:   Inadequate oral intake related to decreased appetite as evidenced by meal completion 50%.  Improved.  Goal:   Pt to meet >/= 90% of their estimated nutrition needs. Met.  Monitor:   PO intake, supplement acceptance, weight  Assessment:   Per SLP note, pt was given trials of Regular textures with thin liquids yesterday. Pt was noted to have prolonged mastication and trace oral residue. SLP upgraded to Dysphagia 2 diet yesterday. Intake remains adequate, currently eating 80 - 100%.   Noted current weight remains incorrect. Discussed with RN, she notes that previous height and weight was accurate. Taking Ensure Complete.  Height: Ht Readings from Last 1 Encounters:  05/31/12 6\' 2"  (1.88 m)    Weight Status:   Wt Readings from Last 1 Encounters:  05/31/12 226 lb 3.1 oz (102.6 kg)   Re-estimated needs:  Kcal: 1600 - 1800 Protein: 75 - 85 grams Fluid: at least 1.6 liters daily  Skin: intact  Diet Order: Dysphagia 2 with thin liquids   Intake/Output Summary (Last 24 hours) at 06/13/12 1116 Last data filed at 06/13/12 0500  Gross per 24 hour  Intake    600 ml  Output    400 ml  Net    200 ml    Last BM: 2/22   Labs:   Recent Labs Lab 06/08/12 1546 06/13/12 0750  NA 135  --   K 4.5  --   CL 99  --   CO2 24  --   BUN 20  --   CREATININE 0.96 0.87  CALCIUM 9.6  --   GLUCOSE 96  --     CBG (last 3)   Recent Labs  06/11/12 2048 06/12/12 0713 06/12/12 1117  GLUCAP 134* 117* 116*    Scheduled Meds: . aspirin  81 mg Oral Daily  . baclofen  5 mg Oral QID  . clopidogrel  75 mg Oral Q  breakfast  . donepezil  5 mg Oral QHS  . enoxaparin (LOVENOX) injection  30 mg Subcutaneous Q12H  . feeding supplement  237 mL Oral BID BM  . QUEtiapine  25 mg Oral BID  . simvastatin  40 mg Oral QHS    Continuous Infusions:  none  Jarold Motto MS, RD, LDN Pager: [phone] After-hours pager: [phone]

## 2012-06-14 ENCOUNTER — Inpatient Hospital Stay (HOSPITAL_COMMUNITY): Payer: Medicare Other | Admitting: Occupational Therapy

## 2012-06-14 ENCOUNTER — Inpatient Hospital Stay (HOSPITAL_COMMUNITY): Payer: Medicare Other | Admitting: Speech Pathology

## 2012-06-14 ENCOUNTER — Inpatient Hospital Stay (HOSPITAL_COMMUNITY): Payer: Medicare Other

## 2012-06-14 DIAGNOSIS — I6330 Cerebral infarction due to thrombosis of unspecified cerebral artery: Secondary | ICD-10-CM

## 2012-06-14 DIAGNOSIS — I69998 Other sequelae following unspecified cerebrovascular disease: Secondary | ICD-10-CM

## 2012-06-14 DIAGNOSIS — R209 Unspecified disturbances of skin sensation: Secondary | ICD-10-CM

## 2012-06-14 DIAGNOSIS — G8110 Spastic hemiplegia affecting unspecified side: Secondary | ICD-10-CM

## 2012-06-14 LAB — GLUCOSE, CAPILLARY
Glucose-Capillary: 101 mg/dL — ABNORMAL HIGH (ref 70–99)
Glucose-Capillary: 107 mg/dL — ABNORMAL HIGH (ref 70–99)
Glucose-Capillary: 95 mg/dL (ref 70–99)
Glucose-Capillary: 83 mg/dL (ref 70–99)
Glucose-Capillary: 68 mg/dL — ABNORMAL LOW (ref 70–99)

## 2012-06-14 NOTE — Progress Notes (Signed)
Patient ID: Raymond Rangel, male   DOB: 1931-11-07, 79 y.o.   MRN: [id number]  Subjective/Complaints: 79 y.o. right-handed male history of TIA, diabetes mellitus and dementia. 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 Admitted 05/26/2012 with right-sided weakness and slurred speech. MRI shows acute left PCA territory infarcts without hemorrhage. MRA of the head with 50% mid basilar stenosis. Proximal occlusion left PCA. Echocardiogram with ejection fraction 60% and grade 1 diastolic dysfunction. Carotid Dopplers with no ICA stenosis. Patient did not receive TPA. Neurology consulted patient maintained on aspirin 81 mg daily as prior to admission and Plavix added to regimen. Subcutaneous Lovenox for DVT prophylaxis. Currently patient is maintained on a dysphagia 1 thin liquid diet after modified barium swallow study 05/28/2012.Monitoring of cognition with noted history of dementia placed on Seroquel.  Incont B and B per RN Per PT still has increased tone but no lethargy  "Slept poorly" Review of Systems  Review of Systems  Constitutional: Negative.   Respiratory: Positive for cough. Negative for hemoptysis, sputum production, shortness of breath and wheezing.        With meals  Gastrointestinal: Negative.   Neurological: Positive for focal weakness.  All other systems reviewed and are negative.    Objective: Vital Signs: Blood pressure 128/80, pulse 68, temperature 98.1 F (36.7 C), temperature source Oral, resp. rate 18, height 5\' 6"  (1.676 m), weight 61.78 kg (136 lb 3.2 oz), SpO2 98.00%. Dg Swallowing Func-speech Pathology  05/29/2012  [name], CCC-SLP     05/29/2012 11:55 AM   Recommend  to proceed with dysphagia 1 (puree) and thin liquids with full  assist with each meals to ensure safety.  Recommend puree mainly  due to severity of oral dysphagia.  Please note patient required  hand over hand assist for self feeding with majority of PO trials  administered by  treating SLP.  Only trace silent aspiration  occurred with nectar thick liquids and x1 with thin liquid but  patient was in optimal upright positioning.       Results for orders placed during the hospital encounter of 05/30/12 (from the past 72 hour(s))  GLUCOSE, CAPILLARY     Status: Abnormal   Collection Time    06/11/12 11:42 AM      Result Value Range   Glucose-Capillary 133 (*) 70 - 99 mg/dL   Comment 1 Notify RN    GLUCOSE, CAPILLARY     Status: Abnormal   Collection Time    06/11/12  4:21 PM      Result Value Range   Glucose-Capillary 113 (*) 70 - 99 mg/dL   Comment 1 Notify RN    GLUCOSE, CAPILLARY     Status: Abnormal   Collection Time    06/11/12  8:48 PM      Result Value Range   Glucose-Capillary 134 (*) 70 - 99 mg/dL  GLUCOSE, CAPILLARY     Status: Abnormal   Collection Time    06/12/12  7:13 AM      Result Value Range   Glucose-Capillary 117 (*) 70 - 99 mg/dL   Comment 1 Notify RN    GLUCOSE, CAPILLARY     Status: Abnormal   Collection Time    06/12/12 11:17 AM      Result Value Range   Glucose-Capillary 116 (*) 70 - 99 mg/dL   Comment 1 Notify RN    CREATININE, SERUM     Status: Abnormal   Collection Time  06/13/12  7:50 AM      Result Value Range   Creatinine, Ser 0.87  0.50 - 1.35 mg/dL   GFR calc non Af Amer 81 (*) >90 mL/min   GFR calc Af Amer >90  >90 mL/min   Comment:            The eGFR has been calculated     using the CKD EPI equation.     This calculation has not been     validated in all clinical     situations.     eGFR's persistently     <90 mL/min signify     possible Chronic Kidney Disease.     HEENT: normal Cardio: RRR Resp: CTA B/L GI: BS positive and Distention Extremity:  Pulses positive and No Edema Skin:   Intact Neuro: Alert/Oriented, Confused but   attentive today. Doesn't realize he's in the hospital or what has happened to him. Flat, Cranial Nerve II-XII normal, Abnormal Sensory Reduced sensation on Right but also has  poor attention to testing, Abnormal Motor 2-/5 in RUE and 3-/5 RLE, 4/5 LUE, 3-/5 LLE and Tone:  increased flexor tone RUE, increased extensor tone RLE Musc/Skel:  Other Old scar L antecubital fossa, non tender Gen:  NAD Cognitive: oriented to person and to hospital but not date Righ homonymous hemianopsia persistent  Assessment/Plan: 1. Functional deficits secondary to Left PCA infarct which require 3+ hours per day of interdisciplinary therapy in a comprehensive inpatient rehab setting. Physiatrist is providing close team supervision and 24 hour management of active medical problems listed below. Physiatrist and rehab team continue to assess barriers to discharge/monitor patient progress toward functional and medical goals. FIM: FIM - Bathing Bathing Steps Patient Completed: Chest;Right Arm;Abdomen;Front perineal area;Right upper leg;Left upper leg;Left Arm Bathing: 3: Mod-Patient completes 5-7 19f 10 parts or 50-74%  FIM - Upper Body Dressing/Undressing Upper body dressing/undressing steps patient completed: Pull shirt over trunk;Thread/unthread left sleeve of pullover shirt/dress;Thread/unthread right sleeve of pullover shirt/dresss Upper body dressing/undressing: 4: Min-Patient completed 75 plus % of tasks FIM - Lower Body Dressing/Undressing Lower body dressing/undressing steps patient completed: Thread/unthread left pants leg Lower body dressing/undressing: 1: Total-Patient completed less than 25% of tasks  FIM - Toileting Toileting: 1: Two helpers  FIM - Diplomatic Services operational officer Devices: Therapist, music Transfers: 0-Activity did not occur  FIM - Banker Devices: Bed rails Bed/Chair Transfer: 4: Supine > Sit: Min A (steadying Pt. > 75%/lift 1 leg);3: Bed > Chair or W/C: Mod A (lift or lower assist)  FIM - Locomotion: Wheelchair Distance: 15 Locomotion: Wheelchair: 0: Activity did not occur FIM - Locomotion:  Ambulation Locomotion: Ambulation Assistive Devices:  (no Ad) Ambulation/Gait Assistance: 1: +2 Total assist Locomotion: Ambulation: 1: Two helpers (120')  Comprehension Comprehension Mode: Auditory Comprehension: 3-Understands basic 50 - 74% of the time/requires cueing 25 - 50%  of the time  Expression Expression Mode: Verbal Expression: 3-Expresses basic 50 - 74% of the time/requires cueing 25 - 50% of the time. Needs to repeat parts of sentences.  Social Interaction Social Interaction: 2-Interacts appropriately 25 - 49% of time - Needs frequent redirection.  Problem Solving Problem Solving Mode: Not assessed Problem Solving: 1-Solves basic less than 25% of the time - needs direction nearly all the time or does not effectively solve problems and may need a restraint for safety  Memory Memory Mode: Not assessed Memory: 1-Recognizes or recalls less than 25% of the time/requires cueing greater than 75%  of the time Medical Problem List and Plan:  1. embolic left PCA infarct  2. DVT Prophylaxis/Anticoagulation: Subcutaneous Lovenox. Monitor platelet counts and any signs of bleeding Hx of Left femoral L popliteal,Left tibial subacute DVT diagnosed January 2013.lovenox 30 bid currently 3. Mood/dementia. Seroquel 25 mg twice a day, Ativan when necessary. Patient displayed baseline cognitive deficits per family. Will expect wax and wane.Trial Aricept- insomnia last noc will monitor, could be related to Aricept 4. Neuropsych: This patient is not capable of making decisions on his/her own behalf.  5. Hyperlipidemia. Zocor  6. Diabetes mellitus with peripheral neuropathy. Hemoglobin A1c 6.7. Patient on diet control prior to admission. Check CBGs a.c. and at bedtime- fair control generally. Cover spikes with SSI for now. 7. Dysphagia. Dysphagia 1 thin liquids. Speech therapy followup. Monitor for any signs of aspiration.  8. UTI resolved 9.  Constipation nsg to give sorbitol 10.  Spasticity  tone improved on Zanaflex but too sedated,titrate baclofen, not sedated,but increased tone    LOS (Days) 15 A FACE TO FACE EVALUATION WAS PERFORMED  KIRSTEINS,ANDREW E 06/14/2012, 7:54 AM

## 2012-06-14 NOTE — Progress Notes (Addendum)
Physical Therapy Session Note  Patient Details  Name: Raymond Rangel MRN: [id number] Date of Birth: 16-Aug-1931  Today's Date: 06/14/2012 Time: 4098-1191 Time Calculation (min): 45 min  Short Term Goals: = LTGs  Skilled Therapeutic Interventions/Progress Updates: Treatment focused on neuromuscular re-education via VCs, demo, manual cues, auditory cues: -sit> stand with max assist, blocking R foot from sliding forward - RLE and RUE in standing at table, focusing on scanning to R, bringing pelvis over LEs x 5 minutes -gait in hall using L rail, x 30' forward, 30' backward, x 30' R sidestepping, x 15' L sidestepping, wearing R Allard Toe Off; mod> max assist -w/c> mat squat pivot to R and L, max> total assist due to pushing backwards -RUE gross grasp in sitting, focusing on R visual field, forward wt shift  Pt exhausted.  W/c> bed squat pivot transfer to L with total assist.  Sit> supine with mod assist.  Bed alarm set and all needs within reach.    Therapy Documentation Precautions:  Precautions Precautions: Fall Precaution Comments: increased tone on right side (extensor in leg and flexor in arm), decreased midline orientation, dementia Restrictions Weight Bearing Restrictions: No   Pain: Pain Assessment Pain Assessment: No/denies pain   Other Treatments: Treatments Therapeutic Activity: sit> stand at sturdy table in front, max assist; in standing: use of L hand for sorting task in R visual field, max assist for balance Neuromuscular Facilitation: Upper Extremity;Right;Lower Extremity;Forced use;Activity to increase grading;Activity to increase timing and sequencing;Activity to increase motor control;Activity to increase lateral weight shifting;Activity to increase anterior-posterior weight shifting Weight Bearing Technique Weight Bearing Technique: Yes RUE Weight Bearing Technique: Forearm standing (intermittent, with reaching of L hand across to R) Response to Weight Bearing  Technique: limited muscle activation  See FIM for current functional status  Therapy/Group: Individual Therapy  COOK,CAROLINE 06/14/2012, 2:13 PM

## 2012-06-14 NOTE — Progress Notes (Signed)
Social Work Patient ID: Raymond Rangel, male   DOB: 1931-05-26, 79 y.o.   MRN: [id number] Met with pt and daughter to inform team conference progression toward goals and discharge 3/12.  Discussed if pt's care is too much for family. Daughter expressed she wants to try it at home so they will know.  She feels he will be better mentally and his care will be better there.  She is aware of how Much care pt requires and will be here to do more hands on care, so she is comfortable with his care by discharge.  Informed her they have 30 days if it is too much At home to decide to pursue NHP.  Discussed DME and follow up, main issue is the physical care of pt and how inconsistent he is with his levels.  Daughter is aware of this. Will work toward pt going home next [employer].

## 2012-06-14 NOTE — Progress Notes (Signed)
Occupational Therapy Session Note  Patient Details  Name: Raymond Rangel MRN: [id number] Date of Birth: Dec 05, 1931  Today's Date: 06/14/2012 Time: 1150-1220 Time Calculation (min): 30 min  Skilled Therapeutic Interventions/Progress Updates:   Pt was seen in [employer] today with focus on self feeding with LUE. Pt was given adaptive utensil as he initially attempted to place spoon in right hand. He attempted to self feed himself given maximum verbal and tactile cuing as well as hand over hand techniques for approximately 5 bites. Pt then picked up regular spoon with left hand and was able to eat 50% meat, 25% potatoes and a few bites of broccoli given increased time and maximum VC's for initiation i.e. "Ok, take another bite now".  Pt was pleasant throughout session.  Therapy Documentation Precautions  Precautions Precautions: Fall Precaution Comments: increased tone on right side (extensor in leg and flexor in arm), decreased midline orientation, dementia Restrictions Weight Bearing Restrictions: No     Pain: Pain Assessment Pain Assessment: No/denies pain       See FIM for current functional status  Therapy/Group: Group Therapy  Barnhill, Amy Beth Dixon 06/14/2012, 1:00 PM

## 2012-06-14 NOTE — Progress Notes (Signed)
Speech Language Pathology Daily Session Note & Weekly Progress Note  Patient Details  Name: Galan Ghee MRN: [id number] Date of Birth: [birth year]  Today's Date: 06/14/2012 Time: 7829-5621 Time Calculation (min): 45 min  Short Term Goals: Week 2: SLP Short Term Goal 1 (Week 2): Patient will follow 1 step directions during functional, familiar tasks with mod assist verbal and visual cues. SLP Short Term Goal 1 - Progress (Week 2): Progressing toward goal SLP Short Term Goal 2 (Week 2): Patient will demonstrate basic problem solving during functional, familiar tasks with mod assist verbal and visual cues. SLP Short Term Goal 2 - Progress (Week 2): Progressing toward goal SLP Short Term Goal 3 (Week 2): Patient will consume Dys.1 textures and thin liquid with min assist verbal, visual and tactile cues with no overt s/s of aspiration.  SLP Short Term Goal 3 - Progress (Week 2): Met SLP Short Term Goal 4 (Week 2): Patient will consume Dys.2 textures and thin liquid with min assist verbal, visual and tactile cues with no overt s/s of aspiration.   Skilled Therapeutic Interventions: Skilled treatment session focused on addressing dysphagia and cognitive goals during a self-feeding task.  Patient was orientated and SLP provided external stimuli to assist with arousal. SLP facilitated session with set-up of breakfast tray and loaded every bite of food onto spoon as well as max assist verbal cues to initiate each bite and switch between bites and sips.  Patient demonstrated slightly prolonged mastication of Dys.2 textures and no overt s/s of aspiration with thin.  Daughter present for session and SLP initiated education with her regarding strategies to utilize to maximize self-feeding.  At end of session daughter was assisting patient with the end of breakfast.    FIM:  Comprehension Comprehension Mode: Auditory Comprehension: 3-Understands basic 50 - 74% of the time/requires cueing 25 - 50%  of the  time Expression Expression Mode: Verbal Expression: 2-Expresses basic 25 - 49% of the time/requires cueing 50 - 75% of the time. Uses single words/gestures. Social Interaction Social Interaction: 3-Interacts appropriately 50 - 74% of the time - May be physically or verbally inappropriate. Problem Solving Problem Solving: 1-Solves basic less than 25% of the time - needs direction nearly all the time or does not effectively solve problems and may need a restraint for safety Memory Memory: 1-Recognizes or recalls less than 25% of the time/requires cueing greater than 75% of the time FIM - Eating Eating Activity: 3: Helper scoops food on utensil every scoop  Pain Pain Assessment Pain Assessment: No/denies pain  Therapy/Group: Individual Therapy   Speech Language Pathology Weekly Progress Note  Patient Details  Name: Wylan Gentzler MRN: [id number] Date of Birth: June 06, 1931  Today's Date: 06/14/2012  Short Term Goals: Week 2: SLP Short Term Goal 1 (Week 2): Patient will follow 1 step directions during functional, familiar tasks with mod assist verbal and visual cues. SLP Short Term Goal 1 - Progress (Week 2): Met SLP Short Term Goal 2 (Week 2): Patient will demonstrate basic problem solving during functional, familiar tasks with mod assist verbal and visual cues. SLP Short Term Goal 2 - Progress (Week 2): Progressing toward goal SLP Short Term Goal 3 (Week 2): Patient will consume Dys.1 textures and thin liquid with min assist verbal, visual and tactile cues with no overt s/s of aspiration.  SLP Short Term Goal 3 - Progress (Week 2): Met SLP Short Term Goal 4 (Week 2): Patient will consume Dys.2 textures and thin liquid with min assist verbal, visual  and tactile cues with no overt s/s of aspiration.  SLP Short Term Goal 4 - Progress (Week 2): Progressing toward goal Week 3: SLP Short Term Goal 1 (Week 3): Patient will follow 1 step directions during functional, familiar tasks with mod assist  verbal and visual cues. SLP Short Term Goal 2 (Week 3): Patient will demonstrate basic problem solving during functional, familiar tasks with mod assist verbal and visual cues. SLP Short Term Goal 3 (Week 3): Patient will consume Dys.2 textures and thin liquid with min assist verbal, visual and tactile cues with no overt s/s of aspiration.  SLP Short Term Goal 4 (Week 3): Family will demonstrate 2 cuing strategies to utilize with patient durig completion of basic self care tasks.  Weekly Progress Updates: Patient met 2 out of 4 short term goals this reporting period due to gains in diet toleration and ability to follow simple 1 step commands during basic, familiar tasks.  Patient made progress toward problem solving and at times can self-feed with min assist; however, he is unable to consistently perform these tasks.  As a result, in the next reporting period SLP included a goal for family to return demonstration of different cuing strategies to utilize given that inconsistent performance will continue after discharge.  Patient continues to require significant cues for safety from clinicians an family required more education; as a result, it is recommended that this patient continue to receive skilled SLP services to maximize functional independence and reduce burden of care upon discharge.    SLP Intensity: Minumum of 1-2 x/day, 30 to 90 minutes SLP Frequency: 5 out of 7 days SLP Duration/Estimated Length of Stay: 1 week SLP Treatment/Interventions: Cognitive remediation/compensation;Cueing hierarchy;Dysphagia/aspiration precaution training;Environmental controls;Functional tasks;Internal/external aids;Patient/family education;Speech/Language facilitation;Therapeutic Activities  Charlane Ferretti., CCC-SLP [phone]  [name],[name] 06/14/2012, 4:31 PM

## 2012-06-14 NOTE — Progress Notes (Signed)
Occupational Therapy Session Note  Patient Details  Name: Raymond Rangel MRN: [id number] Date of Birth: Dec 18, 1931  Today's Date: 06/14/2012 Time: 1005-1120 Time Calculation (min): 75 min  Short Term Goals: Week 1:  OT Short Term Goal 1 (Week 1): Pt will roll in bed with mod assist to assist caregivers with clothing changes. OT Short Term Goal 1 - Progress (Week 1): Progressing toward goal OT Short Term Goal 2 (Week 1): Pt will sit on EOB with mod assist to engage in grooming tasks. OT Short Term Goal 2 - Progress (Week 1): Met OT Short Term Goal 3 (Week 1): Pt will don shirt with max assist. OT Short Term Goal 3 - Progress (Week 1): Met Week 2:  OT Short Term Goal 1 (Week 2): Pt will consistently transfer to the toilet using a squat pivot transfer with max assist x1. OT Short Term Goal 2 (Week 2): Pt will sit to stand from toilet and maintain standing with mod assist. OT Short Term Goal 3 (Week 2): Pt will demonstrate improved RUE motor control by bathing left arm with min assist.      Skilled Therapeutic Interventions/Progress Updates:      Pt seen for BADL retraining of toileting, bathing at shower level, and dressing with a focus on family education with patient's daughter.  Pt level of functioning fluctuated quite a bit today. He also seemed to be more lethargic.  He did request to use the bathroom, but was not able to use it after sitting on the toilet for several minutes.  Today his toilet transfers were a heavy max assist as he was having great difficulty maintaining BLE in flexion.  With the shower stall transfer he only required mod assist as he maintained knee flexion during the squat pivot. After the shower he donned shirt with min assist and mod cues as his initiation levels were low.  He did stand at sink with mod assist and pulled up his pants with left hand which is a big change in progress for LB dressing. Pt then worked on Cytogeneticist at high low table with towel  sliding exercises. Pt was then taken to [employer].    Therapy Documentation Precautions:  Precautions Precautions: Fall Precaution Comments: increased tone on right side (extensor in leg and flexor in arm), decreased midline orientation, dementia Restrictions Weight Bearing Restrictions: No     Pain: Pain Assessment Pain Assessment: No/denies pain ADL:  See FIM for current functional status  Therapy/Group: Individual Therapy  SAGUIER,JULIA 06/14/2012, 11:49 AM

## 2012-06-14 NOTE — Progress Notes (Signed)
Speech Language Pathology Daily Session Note  Patient Details  Name: Raymond Rangel MRN: [id number] Date of Birth: November 06, 1931  Today's Date: 06/14/2012 Time: 1130-1150 Time Calculation (min): 20 min  Short Term Goals: Week 2: SLP Short Term Goal 1 (Week 2): Patient will follow 1 step directions during functional, familiar tasks with mod assist verbal and visual cues. SLP Short Term Goal 1 - Progress (Week 2): Progressing toward goal SLP Short Term Goal 2 (Week 2): Patient will demonstrate basic problem solving during functional, familiar tasks with mod assist verbal and visual cues. SLP Short Term Goal 2 - Progress (Week 2): Progressing toward goal SLP Short Term Goal 3 (Week 2): Patient will consume Dys.1 textures and thin liquid with min assist verbal, visual and tactile cues with no overt s/s of aspiration.  SLP Short Term Goal 3 - Progress (Week 2): Met SLP Short Term Goal 4 (Week 2): Patient will consume Dys.2 textures and thin liquid with min assist verbal, visual and tactile cues with no overt s/s of aspiration.   Skilled Therapeutic Interventions: Pt was seen for co-treatment with OT in [employer] with focus on self-feeding and dysphagia goals. Pt required maximum verbal and tactile cuing as well as hand over hand assist for approximately 5 bites. Pt then picked up regular spoon with left hand and was able to eat 50% of meal. Pt also required Max verbal cues for initiation of self-feeding.  Pt without overt s/s of aspiration but required Min verbal cues and liquid washes to clear oral residue.    FIM:  Comprehension Comprehension Mode: Auditory Comprehension: 3-Understands basic 50 - 74% of the time/requires cueing 25 - 50%  of the time Expression Expression Mode: Verbal Expression: 2-Expresses basic 25 - 49% of the time/requires cueing 50 - 75% of the time. Uses single words/gestures. Social Interaction Social Interaction: 3-Interacts appropriately 50 - 74% of the time - May be  physically or verbally inappropriate. Problem Solving Problem Solving: 1-Solves basic less than 25% of the time - needs direction nearly all the time or does not effectively solve problems and may need a restraint for safety Memory Memory: 1-Recognizes or recalls less than 25% of the time/requires cueing greater than 75% of the time FIM - Eating Eating Activity: 4: Helper occasionally scoops food on utensil;4: Helper checks for pocketed food;4: Help with picking up utensils;4: Helper occasionally brings food to mouth  Pain Pain Assessment Pain Assessment: No/denies pain  Therapy/Group: Group Therapy  PAYNE, COURTNEY 06/14/2012, 2:51 PM

## 2012-06-15 ENCOUNTER — Inpatient Hospital Stay (HOSPITAL_COMMUNITY): Payer: Medicare Other | Admitting: Occupational Therapy

## 2012-06-15 ENCOUNTER — Inpatient Hospital Stay (HOSPITAL_COMMUNITY): Payer: Medicare Other | Admitting: Speech Pathology

## 2012-06-15 ENCOUNTER — Inpatient Hospital Stay (HOSPITAL_COMMUNITY): Payer: Medicare Other

## 2012-06-15 LAB — GLUCOSE, CAPILLARY
Glucose-Capillary: 59 mg/dL — ABNORMAL LOW (ref 70–99)
Glucose-Capillary: 53 mg/dL — ABNORMAL LOW (ref 70–99)
Glucose-Capillary: 76 mg/dL (ref 70–99)

## 2012-06-15 NOTE — Progress Notes (Signed)
Occupational Therapy Note  Patient Details  Name: Raymond Rangel MRN: [id number] Date of Birth: 24-Sep-1931 Today's Date: 06/15/2012  Time:  1300-1315  (15 mins ).  Co Treatment with P.T for total time of 30 minutes Pain:  None Treatment focused on sit to stand, functional mobility, transfers,  RUE weight bearing and closed chain activities.  Pt.exhibits decreased hand extension and increased flexor tone.   Pt transferred to left with minimal assist and less hands on to provide him the verbal cues to perform the movements and less physical.  Pt. Ambulated with +2 assistance about 150 feet before resting.    [name] 06/15/2012, 6:11 PM

## 2012-06-15 NOTE — Progress Notes (Signed)
Occupational Therapy Weekly Progress Note  Patient Details  Name: Raymond Rangel MRN: [id number] Date of Birth: 04/10/1932  Today's Date: 06/15/2012 Time: 1400-1500 Time Calculation (min): 60 min  1:1 Pt seen for ADL retraining of tub bench transfers in ADL apartment and standing balance for LB adls.  Pt was able to transfer on and off tub bench with a squat pivot with mod to max assist and max cues as he was having a great deal of difficulty with motor planning how to move hips off bench to w/c even though he had just moved onto bench with min assist.  Pt seemed to be more lethargic this session. He did work on standing but leaned to right and kept trunk in flexion. Pt then worked on RUE coordination with towel slides and grasping and releasing cones. He was having difficulty focusing on tasks and would continually avert eyes to left when asked to look to right to to pick up objects. He was able to slide the towel, but had great difficulty with motor planning for grasping and releasing cones. Pt taken to room to work on standing at the sink but would not make eye contact in the mirror and continued to lean to the right.  Pt appeared to be fatigue. At end of session, pt stated that he would prefer to stay in w/c. Quick release belt applied, call light within reach.  Patient has met 2 of 3 short term goals.  Pt has not met STG of standing at toilet with mod assist as he continues to need max assist about 50% of the time with a posterior lean.  Patient continues to demonstrate the following deficits: RUE apraxia, overall motor planning deficits, right hemiplegia, severe visual perceptual deficts including right side awareness and right visual field, impaired sitting and standing balance and therefore will continue to benefit from skilled OT intervention to enhance overall performance with BADL and Reduce care partner burden.  Patient progressing toward long term goals..  Continue plan of care.  OT Short  Term Goals Week 1:  OT Short Term Goal 1 (Week 1): Pt will roll in bed with mod assist to assist caregivers with clothing changes. OT Short Term Goal 1 - Progress (Week 1): Progressing toward goal OT Short Term Goal 2 (Week 1): Pt will sit on EOB with mod assist to engage in grooming tasks. OT Short Term Goal 2 - Progress (Week 1): Met OT Short Term Goal 3 (Week 1): Pt will don shirt with max assist. OT Short Term Goal 3 - Progress (Week 1): Met Week 2:  OT Short Term Goal 1 (Week 2): Pt will consistently transfer to the toilet using a squat pivot transfer with max assist x1. OT Short Term Goal 1 - Progress (Week 2): Met OT Short Term Goal 2 (Week 2): Pt will sit to stand from toilet and maintain standing with mod assist. OT Short Term Goal 2 - Progress (Week 2): Progressing toward goal OT Short Term Goal 3 (Week 2): Pt will demonstrate improved RUE motor control by bathing left arm with min assist. OT Short Term Goal 3 - Progress (Week 2): Met Week 3:  OT Short Term Goal 1 (Week 3): Pt will transfer to tub bench in tub with mod to max assist. OT Short Term Goal 2 (Week 3): Pt will transfer to the toilet with his daughter assisting him with mod to max assist. OT Short Term Goal 4 (Week 3): Pt will stand at toilet with mod assist  so his daughter can assist him with clothing management.  Skilled Therapeutic Interventions/Progress Updates: Continue OT 5-7 days a week for 60-90 min sessions with Balance/vestibular training;Cognitive remediation/compensation;Discharge planning;DME/adaptive equipment instruction;Functional mobility training;Neuromuscular re-education;Patient/family education;Self Care/advanced ADL retraining;Therapeutic Activities;Therapeutic Exercise;UE/LE Strength taining/ROM;UE/LE Coordination activities;Visual/perceptual remediation/compensation to maximize patient's level of independence with self care skills.  Therapy Documentation Precautions:  Precautions Precautions:  Fall Precaution Comments: increased tone on right side (extensor in leg and flexor in arm), decreased midline orientation, dementia Restrictions Weight Bearing Restrictions: No Pain: Pain Assessment Pain Assessment: No/denies pain ADL:  See FIM for current functional status  Therapy/Group: Individual Therapy  SAGUIER,JULIA 06/15/2012, 3:26 PM

## 2012-06-15 NOTE — Progress Notes (Signed)
Speech Language Pathology Daily Session Note  Patient Details  Name: Raymond Rangel MRN: [id number] Date of Birth: 16-Dec-1931  Today's Date: 06/15/2012 Time: 9629-5284 Time Calculation (min): 40 min  Short Term Goals: Week 3: SLP Short Term Goal 1 (Week 3): Patient will follow 1 step directions during functional, familiar tasks with mod assist verbal and visual cues. SLP Short Term Goal 2 (Week 3): Patient will demonstrate basic problem solving during functional, familiar tasks with mod assist verbal and visual cues. SLP Short Term Goal 3 (Week 3): Patient will consume Dys.2 textures and thin liquid with min assist verbal, visual and tactile cues with no overt s/s of aspiration.  SLP Short Term Goal 4 (Week 3): Family will demonstrate 2 cuing strategies to utilize with patient durig completion of basic self care tasks.  Skilled Therapeutic Interventions: Skilled treatment session focused on addressing dysphagia and cognitive goals during a self-feeding task.  Patient was orientated by SLP.  SLP facilitated session with set-up of breakfast tray and occassionally loaded fork and asssit with managment of cup.  SLP also facilitated session with mod assist verbal cues to initiate bites and switch between bites and sips.  Patient demonstrated more timelly mastication of Dys.2 textures and cough x1 due to talking with food in mouth resulting in decreased awareness/control of bolus.  Hard reflexive cough appeared to successfully remove suspected penetrates.  Functioal improvement compared to yesterday's session.   FIM:  Comprehension Comprehension Mode: Auditory Comprehension: 3-Understands basic 50 - 74% of the time/requires cueing 25 - 50%  of the time Expression Expression Mode: Verbal Expression: 3-Expresses basic 50 - 74% of the time/requires cueing 25 - 50% of the time. Needs to repeat parts of sentences. Social Interaction Social Interaction: 3-Interacts appropriately 50 - 74% of the time - May  be physically or verbally inappropriate. Problem Solving Problem Solving: 2-Solves basic 25 - 49% of the time - needs direction more than half the time to initiate, plan or complete simple activities Memory Memory: 1-Recognizes or recalls less than 25% of the time/requires cueing greater than 75% of the time FIM - Eating Eating Activity: 4: Help with managing cup/glass;4: Helper occasionally scoops food on utensil  Pain Pain Assessment Pain Assessment: No/denies pain Pain Score: 0-No pain  Therapy/Group: Individual Therapy  Charlane Ferretti., CCC-SLP [phone]  [name],[name] 06/15/2012, 8:57 AM

## 2012-06-15 NOTE — Progress Notes (Signed)
Speech Language Pathology Daily Session Note  Patient Details  Name: Raymond Rangel MRN: [id number] Date of Birth: 03-09-1932  Today's Date: 06/15/2012 Time: 1200-1225 Time Calculation (min): 25 min  Short Term Goals: Week 3: SLP Short Term Goal 1 (Week 3): Patient will follow 1 step directions during functional, familiar tasks with mod assist verbal and visual cues. SLP Short Term Goal 2 (Week 3): Patient will demonstrate basic problem solving during functional, familiar tasks with mod assist verbal and visual cues. SLP Short Term Goal 3 (Week 3): Patient will consume Dys.2 textures and thin liquid with min assist verbal, visual and tactile cues with no overt s/s of aspiration.  SLP Short Term Goal 4 (Week 3): Family will demonstrate 2 cuing strategies to utilize with patient durig completion of basic self care tasks.  Skilled Therapeutic Interventions: Group, co-treatment session with OT; SLP facilitated session by addressing dysphagia goals.  Patient consumed Dys.2 textures and thin liquids with max assist verbal and tactile cues to utilize liquid washes to assist at reducing oral residue.  Patient also required max assist to attend and initiate self-feeding thorough session with OT providing hand over had assist.     Pain Pain Assessment Pain Assessment: No/denies pain  Therapy/Group: Group Therapy  Charlane Ferretti., CCC-SLP [phone]  [name],[name] 06/15/2012, 1:28 PM

## 2012-06-15 NOTE — Progress Notes (Signed)
Social Work Patient ID: Raymond Rangel, male   DOB: [birth year], 79 y.o.   MRN: [id number] Daughter could not get in for therapies due to the bad weather.  She was wondering about a home eval prior to pt being discharged on Wed. Will ask team and have address with daughter.  Work toward discharge: services, follow up and education.

## 2012-06-15 NOTE — Progress Notes (Signed)
Patient ID: Raymond Rangel, male   DOB: Mar 06, 1932, 79 y.o.   MRN: [id number]  Subjective/Complaints: 79 y.o. right-handed male history of TIA, diabetes mellitus and dementia. 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 Admitted 05/26/2012 with right-sided weakness and slurred speech. MRI shows acute left PCA territory infarcts without hemorrhage. MRA of the head with 50% mid basilar stenosis. Proximal occlusion left PCA. Echocardiogram with ejection fraction 60% and grade 1 diastolic dysfunction. Carotid Dopplers with no ICA stenosis. Patient did not receive TPA. Neurology consulted patient maintained on aspirin 81 mg daily as prior to admission and Plavix added to regimen. Subcutaneous Lovenox for DVT prophylaxis. Currently patient is maintained on a dysphagia 1 thin liquid diet after modified barium swallow study 05/28/2012.Monitoring of cognition with noted history of dementia placed on Seroquel.  Intake reduced yesterday ~40% meals Spoke to pt regarding his intake, enc po    Review of Systems  Review of Systems  Constitutional: Negative.   Respiratory: Positive for cough. Negative for hemoptysis, sputum production, shortness of breath and wheezing.        With meals  Gastrointestinal: Negative.   Neurological: Positive for focal weakness.  All other systems reviewed and are negative.    Objective: Vital Signs: Blood pressure 129/83, pulse 62, temperature 97.6 F (36.4 C), temperature source Oral, resp. rate 18, height 5\' 6"  (1.676 m), weight 61.78 kg (136 lb 3.2 oz), SpO2 99.00%. Dg Swallowing Func-speech Pathology  05/29/2012  [name], CCC-SLP     05/29/2012 11:55 AM   Recommend  to proceed with dysphagia 1 (puree) and thin liquids with full  assist with each meals to ensure safety.  Recommend puree mainly  due to severity of oral dysphagia.  Please note patient required  hand over hand assist for self feeding with majority of PO trials  administered by  treating SLP.  Only trace silent aspiration  occurred with nectar thick liquids and x1 with thin liquid but  patient was in optimal upright positioning.       Results for orders placed during the hospital encounter of 05/30/12 (from the past 72 hour(s))  GLUCOSE, CAPILLARY     Status: Abnormal   Collection Time    06/12/12  4:33 PM      Result Value Range   Glucose-Capillary 101 (*) 70 - 99 mg/dL   Comment 1 Notify RN    GLUCOSE, CAPILLARY     Status: Abnormal   Collection Time    06/12/12  9:11 PM      Result Value Range   Glucose-Capillary 107 (*) 70 - 99 mg/dL   Comment 1 Notify RN    GLUCOSE, CAPILLARY     Status: None   Collection Time    06/13/12  7:18 AM      Result Value Range   Glucose-Capillary 95  70 - 99 mg/dL  CREATININE, SERUM     Status: Abnormal   Collection Time    06/13/12  7:50 AM      Result Value Range   Creatinine, Ser 0.87  0.50 - 1.35 mg/dL   GFR calc non Af Amer 81 (*) >90 mL/min   GFR calc Af Amer >90  >90 mL/min   Comment:            The eGFR has been calculated     using the CKD EPI equation.     This calculation has not been     validated in all clinical  situations.     eGFR's persistently     <90 mL/min signify     possible Chronic Kidney Disease.  GLUCOSE, CAPILLARY     Status: None   Collection Time    06/13/12 11:27 AM      Result Value Range   Glucose-Capillary 83  70 - 99 mg/dL  GLUCOSE, CAPILLARY     Status: Abnormal   Collection Time    06/13/12  4:18 PM      Result Value Range   Glucose-Capillary 64 (*) 70 - 99 mg/dL   Comment 1 Notify RN    GLUCOSE, CAPILLARY     Status: None   Collection Time    06/13/12  5:13 PM      Result Value Range   Glucose-Capillary 88  70 - 99 mg/dL  GLUCOSE, CAPILLARY     Status: Abnormal   Collection Time    06/13/12  9:15 PM      Result Value Range   Glucose-Capillary 103 (*) 70 - 99 mg/dL   Comment 1 Notify RN    GLUCOSE, CAPILLARY     Status: None   Collection Time    06/14/12  7:16  AM      Result Value Range   Glucose-Capillary 93  70 - 99 mg/dL   Comment 1 Notify RN    GLUCOSE, CAPILLARY     Status: Abnormal   Collection Time    06/14/12 11:37 AM      Result Value Range   Glucose-Capillary 68 (*) 70 - 99 mg/dL  GLUCOSE, CAPILLARY     Status: None   Collection Time    06/14/12  4:25 PM      Result Value Range   Glucose-Capillary 84  70 - 99 mg/dL  GLUCOSE, CAPILLARY     Status: None   Collection Time    06/14/12  9:09 PM      Result Value Range   Glucose-Capillary 96  70 - 99 mg/dL   Comment 1 Notify RN    GLUCOSE, CAPILLARY     Status: Abnormal   Collection Time    06/15/12  7:20 AM      Result Value Range   Glucose-Capillary 61 (*) 70 - 99 mg/dL  GLUCOSE, CAPILLARY     Status: None   Collection Time    06/15/12  7:49 AM      Result Value Range   Glucose-Capillary 88  70 - 99 mg/dL  GLUCOSE, CAPILLARY     Status: Abnormal   Collection Time    06/15/12 11:19 AM      Result Value Range   Glucose-Capillary 59 (*) 70 - 99 mg/dL     HEENT: normal Cardio: RRR Resp: CTA B/L GI: BS positive and Distention Extremity:  Pulses positive and No Edema Skin:   Intact Neuro: Alert/Oriented, Confused but   attentive today. Doesn't realize he's in the hospital or what has happened to him. Flat, Cranial Nerve II-XII normal, Abnormal Sensory Reduced sensation on Right but also has poor attention to testing, Abnormal Motor 2-/5 in RUE and 3-/5 RLE, 4/5 LUE, 3-/5 LLE and Tone:  increased flexor tone RUE, increased extensor tone RLE Musc/Skel:  Other Old scar L antecubital fossa, non tender Gen:  NAD Cognitive: oriented to person and to hospital but not date Righ homonymous hemianopsia persistent  Assessment/Plan: 1. Functional deficits secondary to Left PCA infarct which require 3+ hours per day of interdisciplinary therapy in a comprehensive inpatient rehab setting. Physiatrist  is providing close team supervision and 24 hour management of active medical problems  listed below. Physiatrist and rehab team continue to assess barriers to discharge/monitor patient progress toward functional and medical goals. FIM: FIM - Bathing Bathing Steps Patient Completed: Chest;Right Arm;Abdomen;Front perineal area;Right upper leg;Left upper leg Bathing: 3: Mod-Patient completes 5-7 21f 10 parts or 50-74%  FIM - Upper Body Dressing/Undressing Upper body dressing/undressing steps patient completed: Pull shirt over trunk;Thread/unthread left sleeve of pullover shirt/dress;Thread/unthread right sleeve of pullover shirt/dresss Upper body dressing/undressing: 4: Min-Patient completed 75 plus % of tasks FIM - Lower Body Dressing/Undressing Lower body dressing/undressing steps patient completed: Thread/unthread left pants leg;Pull pants up/down Lower body dressing/undressing: 2: Max-Patient completed 25-49% of tasks  FIM - Toileting Toileting: 1: Two helpers  FIM - Diplomatic Services operational officer Devices: Grab bars Toilet Transfers: 2-To toilet/BSC: Max A (lift and lower assist);2-From toilet/BSC: Max A (lift and lower assist)  FIM - Press photographer Assistive Devices: Arm rests Bed/Chair Transfer: 1: Chair or W/C > Bed: Total A (helper does all/Pt. < 25%)  FIM - Locomotion: Wheelchair Distance: 15 Locomotion: Wheelchair: 1: Total Assistance/staff pushes wheelchair (Pt<25%) FIM - Locomotion: Ambulation Locomotion: Ambulation Assistive Devices: Other (comment) (hall rail on L) Ambulation/Gait Assistance: 2: Max assist Locomotion: Ambulation: 1: Travels less than 50 ft with maximal assistance (Pt: 25 - 49%)  Comprehension Comprehension Mode: Auditory Comprehension: 3-Understands basic 50 - 74% of the time/requires cueing 25 - 50%  of the time  Expression Expression Mode: Verbal Expression: 3-Expresses basic 50 - 74% of the time/requires cueing 25 - 50% of the time. Needs to repeat parts of sentences.  Social Interaction Social  Interaction: 3-Interacts appropriately 50 - 74% of the time - May be physically or verbally inappropriate.  Problem Solving Problem Solving Mode: Not assessed Problem Solving: 2-Solves basic 25 - 49% of the time - needs direction more than half the time to initiate, plan or complete simple activities  Memory Memory Mode: Not assessed Memory: 1-Recognizes or recalls less than 25% of the time/requires cueing greater than 75% of the time Medical Problem List and Plan:  1. embolic left PCA infarct  2. DVT Prophylaxis/Anticoagulation: Subcutaneous Lovenox. Monitor platelet counts and any signs of bleeding Hx of Left femoral L popliteal,Left tibial subacute DVT diagnosed January 2013.lovenox 30 bid currently 3. Mood/dementia. Seroquel 25 mg twice a day, Ativan when necessary. Patient displayed baseline cognitive deficits per family. Will expect wax and wane.Trial Aricept- insomnia last noc will monitor,also with reduced intake could be related to Aricept will D/C 4. Neuropsych: This patient is not capable of making decisions on his/her own behalf.  5. Hyperlipidemia. Zocor  6. Diabetes mellitus with peripheral neuropathy. Hemoglobin A1c 6.7. Patient on diet control prior to admission. Check CBGs a.c. and at bedtime- fair control generally. Cover spikes with SSI for now. 7. Dysphagia. Dysphagia 1 thin liquids. Speech therapy followup. Monitor for any signs of aspiration.  8. UTI resolved 9.  Constipation nsg to give sorbitol 10.  Spasticity tone improved on Zanaflex but too sedated,titrate baclofen, not sedated,but increased tone    LOS (Days) 16 A FACE TO FACE EVALUATION WAS PERFORMED  KIRSTEINS,ANDREW E 06/15/2012, 12:03 PM

## 2012-06-15 NOTE — Progress Notes (Signed)
Hypoglycemic Event  CBG: 53  Treatment: 15 GM carbohydrate snack; ate supper  Symptoms: None  Follow-up CBG: 1732 CBG Result:76  Possible Reasons for Event: Unknown  Comments/MD notified: Notified Harvel Ricks, PA, no new order, states he will review the patient order.    Rcom, Thaih  Remember to initiate Hypoglycemia Order Set & complete

## 2012-06-15 NOTE — Progress Notes (Signed)
Occupational Therapy Note  Patient Details  Name: Raymond Rangel MRN: [id number] Date of Birth: [birth year] Today's Date: 06/15/2012  [employer] with SLP [id number]  No c/o pain Self feeding group with focus on functional use of right UE to scoop food and bring to mouth, initiation, sustained attention, simple problem solving. Pt required max to total A to scoop food and bring to mouth with accuracy. Max cuing for taking a sip of tea to initiation of swallow  [name] [facility] 06/15/2012, 2:43 PM

## 2012-06-15 NOTE — Progress Notes (Signed)
Patient's blood sugar run low prior to meals( breakfast, lunch, supper), no symptoms noted.  Harvel Ricks, PA is aware, no new orders at this time, states will look into it.

## 2012-06-15 NOTE — Progress Notes (Signed)
Physical Therapy Note  Patient Details  Name: Raymond Rangel MRN: [id number] Date of Birth: 01/03/1932 Today's Date: 06/15/2012  11:00 - 11:15 15 minutes Individual session Patient denies pain.  Treatment focused on use of Wii and Just Dance to facilitate active movement in standing. Patient sit to stand with +1 max assist (lifting and lowering). Patient stood x 5 minutes with +1 mod assist to maintain. Patient pushing heavily to right. Patient initiated a few arm movements but no other active movement initiated by use of Wii. NT came to check blood sugar and in 50's. Patient returned to nurses station to get orange juice.  1:15 - 2:00 45 minutes Individual session (+2 co-treatment with OT) Patient denies pain.   Treatment focused on transfers, sit to stand and ambulation. Patient transferred with sliding board going to left with mod assist. Patient transferred stand pivot to left with minimal lifting and lowering assist. This PT feels that stand pivot transfers would be easiest for family to use at home at discharge. These are more automatic for patient and he is able to motor plan better with these transfers. Patient has more difficulty transferring to right. Patient does better with sit to stands the less facilitation that is given - manual cueing tends to increase his pushing. Patient able to perform sit to stand with min assist with repetitions. Patient ambulated 150 feet, 200 feet, and 100 feet with +2 hand held assist. Patient able to progress right LE with cueing to take bigger steps and with facilitation to weight shift to left. Patient still rotates and adducts right LE to progress it forward and has little to no hip and knee flexion. Patient using toe off brace to assist toe clearance.    Arelia Longest M 06/15/2012, 3:17 PM

## 2012-06-15 NOTE — Plan of Care (Signed)
Hypoglycemic Event  CBG: 61  Treatment: 15 GM carbohydrate snack, ate breakfast  Symptoms: None  Follow-up CBG: Time:0749 CBG Result:88  Possible Reasons for Event: Unknown  Comments/MD notified: Notified Delle Reining, PA, no new order.      Rcom, Thaih  Remember to initiate Hypoglycemia Order Set & complete

## 2012-06-16 ENCOUNTER — Encounter (HOSPITAL_COMMUNITY): Payer: Medicare Other | Admitting: Occupational Therapy

## 2012-06-16 ENCOUNTER — Inpatient Hospital Stay (HOSPITAL_COMMUNITY): Payer: Medicare Other | Admitting: Occupational Therapy

## 2012-06-16 LAB — GLUCOSE, CAPILLARY
Glucose-Capillary: 99 mg/dL (ref 70–99)
Glucose-Capillary: 124 mg/dL — ABNORMAL HIGH (ref 70–99)

## 2012-06-16 NOTE — Progress Notes (Signed)
Patient ID: Raymond Rangel, male   DOB: [birth year], 79 y.o.   MRN: [id number]  Subjective/Complaints: 79 y.o. right-handed male history of TIA, diabetes mellitus and dementia. 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 Admitted 05/26/2012 with right-sided weakness and slurred speech. MRI shows acute left PCA territory infarcts without hemorrhage. MRA of the head with 50% mid basilar stenosis. Proximal occlusion left PCA. Echocardiogram with ejection fraction 60% and grade 1 diastolic dysfunction. Carotid Dopplers with no ICA stenosis. Patient did not receive TPA. Neurology consulted patient maintained on aspirin 81 mg daily as prior to admission and Plavix added to regimen. Subcutaneous Lovenox for DVT prophylaxis. Currently patient is maintained on a dysphagia 1 thin liquid diet after modified barium swallow study 05/28/2012.Monitoring of cognition with noted history of dementia placed on Seroquel.  Intake reduced yesterday ~40%x2 but lunch was 100% Spoke to pt regarding his intake, enc po    Review of Systems  Review of Systems  Constitutional: Negative.   Respiratory: Positive for cough. Negative for hemoptysis, sputum production, shortness of breath and wheezing.        With meals  Gastrointestinal: Negative.   Neurological: Positive for focal weakness.  All other systems reviewed and are negative.    Objective: Vital Signs: Blood pressure 116/67, pulse 65, temperature 97.8 F (36.6 C), temperature source Oral, resp. rate 17, height 5\' 6"  (1.676 m), weight 61.78 kg (136 lb 3.2 oz), SpO2 97.00%. Dg Swallowing Func-speech Pathology  05/29/2012  [name], CCC-SLP     05/29/2012 11:55 AM   Recommend  to proceed with dysphagia 1 (puree) and thin liquids with full  assist with each meals to ensure safety.  Recommend puree mainly  due to severity of oral dysphagia.  Please note patient required  hand over hand assist for self feeding with majority of PO trials   administered by treating SLP.  Only trace silent aspiration  occurred with nectar thick liquids and x1 with thin liquid but  patient was in optimal upright positioning.       Results for orders placed during the hospital encounter of 05/30/12 (from the past 72 hour(s))  GLUCOSE, CAPILLARY     Status: None   Collection Time    06/13/12 11:27 AM      Result Value Range   Glucose-Capillary 83  70 - 99 mg/dL  GLUCOSE, CAPILLARY     Status: Abnormal   Collection Time    06/13/12  4:18 PM      Result Value Range   Glucose-Capillary 64 (*) 70 - 99 mg/dL   Comment 1 Notify RN    GLUCOSE, CAPILLARY     Status: None   Collection Time    06/13/12  5:13 PM      Result Value Range   Glucose-Capillary 88  70 - 99 mg/dL  GLUCOSE, CAPILLARY     Status: Abnormal   Collection Time    06/13/12  9:15 PM      Result Value Range   Glucose-Capillary 103 (*) 70 - 99 mg/dL   Comment 1 Notify RN    GLUCOSE, CAPILLARY     Status: None   Collection Time    06/14/12  7:16 AM      Result Value Range   Glucose-Capillary 93  70 - 99 mg/dL   Comment 1 Notify RN    GLUCOSE, CAPILLARY     Status: Abnormal   Collection Time    06/14/12 11:37 AM  Result Value Range   Glucose-Capillary 68 (*) 70 - 99 mg/dL  GLUCOSE, CAPILLARY     Status: None   Collection Time    06/14/12  4:25 PM      Result Value Range   Glucose-Capillary 84  70 - 99 mg/dL  GLUCOSE, CAPILLARY     Status: None   Collection Time    06/14/12  9:09 PM      Result Value Range   Glucose-Capillary 96  70 - 99 mg/dL   Comment 1 Notify RN    GLUCOSE, CAPILLARY     Status: Abnormal   Collection Time    06/15/12  7:20 AM      Result Value Range   Glucose-Capillary 61 (*) 70 - 99 mg/dL  GLUCOSE, CAPILLARY     Status: None   Collection Time    06/15/12  7:49 AM      Result Value Range   Glucose-Capillary 88  70 - 99 mg/dL  GLUCOSE, CAPILLARY     Status: Abnormal   Collection Time    06/15/12 11:19 AM      Result Value Range    Glucose-Capillary 59 (*) 70 - 99 mg/dL  GLUCOSE, CAPILLARY     Status: Abnormal   Collection Time    06/15/12  4:48 PM      Result Value Range   Glucose-Capillary 53 (*) 70 - 99 mg/dL  GLUCOSE, CAPILLARY     Status: None   Collection Time    06/15/12  5:32 PM      Result Value Range   Glucose-Capillary 76  70 - 99 mg/dL  GLUCOSE, CAPILLARY     Status: None   Collection Time    06/16/12  5:17 AM      Result Value Range   Glucose-Capillary 99  70 - 99 mg/dL   Comment 1 Notify RN    GLUCOSE, CAPILLARY     Status: Abnormal   Collection Time    06/16/12  7:30 AM      Result Value Range   Glucose-Capillary 112 (*) 70 - 99 mg/dL     HEENT: normal Cardio: RRR Resp: CTA B/L GI: BS positive and Distention Extremity:  Pulses positive and No Edema Skin:   Intact Neuro: Alert/Oriented, Confused but   attentive today. Doesn't realize he's in the hospital or what has happened to him. Flat, Cranial Nerve II-XII normal, Abnormal Sensory Reduced sensation on Right but also has poor attention to testing, Abnormal Motor 2-/5 in RUE and 3-/5 RLE, 4/5 LUE, 3-/5 LLE and Tone:  increased flexor tone RUE, increased extensor tone RLE Musc/Skel:  Other Old scar L antecubital fossa, non tender Gen:  NAD Cognitive: oriented to person and to hospital but not date Righ homonymous hemianopsia persistent  Assessment/Plan: 1. Functional deficits secondary to Left PCA infarct which require 3+ hours per day of interdisciplinary therapy in a comprehensive inpatient rehab setting. Physiatrist is providing close team supervision and 24 hour management of active medical problems listed below. Physiatrist and rehab team continue to assess barriers to discharge/monitor patient progress toward functional and medical goals. FIM: FIM - Bathing Bathing Steps Patient Completed: Chest;Right Arm;Abdomen;Front perineal area;Right upper leg;Left upper leg Bathing: 3: Mod-Patient completes 5-7 71f 10 parts or 50-74%  FIM -  Upper Body Dressing/Undressing Upper body dressing/undressing steps patient completed: Pull shirt over trunk;Thread/unthread left sleeve of pullover shirt/dress;Thread/unthread right sleeve of pullover shirt/dresss Upper body dressing/undressing: 4: Min-Patient completed 75 plus % of tasks FIM -  Lower Body Dressing/Undressing Lower body dressing/undressing steps patient completed: Thread/unthread left pants leg;Pull pants up/down Lower body dressing/undressing: 2: Max-Patient completed 25-49% of tasks  FIM - Toileting Toileting: 1: Two helpers  FIM - Diplomatic Services operational officer Devices: Grab bars Toilet Transfers: 2-To toilet/BSC: Max A (lift and lower assist);2-From toilet/BSC: Max A (lift and lower assist)  FIM - Banker Devices: Arm rests Bed/Chair Transfer: 2: Bed > Chair or W/C: Max A (lift and lower assist);2: Chair or W/C > Bed: Max A (lift and lower assist)  FIM - Locomotion: Wheelchair Distance: 15 Locomotion: Wheelchair: 1: Total Assistance/staff pushes wheelchair (Pt<25%) FIM - Locomotion: Ambulation Locomotion: Ambulation Assistive Devices: Other (comment) (hall rail on L) Ambulation/Gait Assistance: 1: +2 Total assist Locomotion: Ambulation: 1: Two helpers  Comprehension Comprehension Mode: Auditory Comprehension: 3-Understands basic 50 - 74% of the time/requires cueing 25 - 50%  of the time  Expression Expression Mode: Verbal Expression: 3-Expresses basic 50 - 74% of the time/requires cueing 25 - 50% of the time. Needs to repeat parts of sentences.  Social Interaction Social Interaction: 3-Interacts appropriately 50 - 74% of the time - May be physically or verbally inappropriate.  Problem Solving Problem Solving Mode: Not assessed Problem Solving: 2-Solves basic 25 - 49% of the time - needs direction more than half the time to initiate, plan or complete simple activities  Memory Memory Mode: Not  assessed Memory: 1-Recognizes or recalls less than 25% of the time/requires cueing greater than 75% of the time Medical Problem List and Plan:  1. embolic left PCA infarct  2. DVT Prophylaxis/Anticoagulation: Subcutaneous Lovenox. Monitor platelet counts and any signs of bleeding Hx of Left femoral L popliteal,Left tibial subacute DVT diagnosed January 2013.lovenox 30 bid currently 3. Mood/dementia. Seroquel 25 mg twice a day, Ativan when necessary. Patient displayed baseline cognitive deficits per family. Will expect wax and wane.Trial Aricept- insomnia related to Aricept improved after D/C, will consider namenda 4. Neuropsych: This patient is not capable of making decisions on his/her own behalf.  5. Hyperlipidemia. Zocor  6. Diabetes mellitus with peripheral neuropathy. Hemoglobin A1c 6.7. Patient on diet control prior to admission. Check CBGs a.c. and at bedtime- fair control generally. Cover spikes with SSI for now. 7. Dysphagia. Dysphagia 1 thin liquids. Speech therapy followup. Monitor for any signs of aspiration.  8. UTI resolved 9.  Constipation nsg to give sorbitol 10.  Spasticity tone improved on Zanaflex but too sedated,titrate baclofen, not sedated,but increased tone    LOS (Days) 17 A FACE TO FACE EVALUATION WAS PERFORMED  KIRSTEINS,ANDREW E 06/16/2012, 9:56 AM

## 2012-06-16 NOTE — Progress Notes (Signed)
Occupational Therapy Note  Patient Details  Name: Raymond Rangel MRN: [id number] Date of Birth: 1931-10-09 Today's Date: 06/16/2012  Pt missed 30 mins group session for self-feeding with SLP.  Pt unwilling to get OOB to participate, therefore nurse tech assisted pt with self-feeding.  Leonette Monarch 06/16/2012, 3:15 PM

## 2012-06-16 NOTE — Plan of Care (Signed)
Problem: RH BOWEL ELIMINATION Goal: RH STG MANAGE BOWEL WITH ASSISTANCE STG Manage Bowel with max assist timed toilet (prn meds only)  Outcome: Not Progressing LBM 06-12-12 sorbitol given with no results

## 2012-06-16 NOTE — Progress Notes (Signed)
Occupational Therapy Session Note  Patient Details  Name: Raymond Rangel MRN: [id number] Date of Birth: 08/13/1931  Today's Date: 06/16/2012 Time: 1300-1330 Time Calculation (min): 30 min  Short Term Goals: Week 3:  OT Short Term Goal 1 (Week 3): Pt will transfer to tub bench in tub with mod to max assist. OT Short Term Goal 2 (Week 3): Pt will transfer to the toilet with his daughter assisting him with mod to max assist. OT Short Term Goal 4 (Week 3): Pt will stand at toilet with mod assist so his daughter can assist him with clothing management.  Skilled Therapeutic Interventions/Progress Updates:    Pt seen for 1:1 OT with focus on functional mobility, sitting balance, transfers, and attention to Rt.  Pt in bed upon arrival and willing to get OOB.  Pt required max cues and physical assistance with bed mobility to sit EOB.  Engaged in squat pivot transfer to Rt with +2 assist secondary to increased extensor tone in RLE with transfer.  Engaged in table top task to increase attention to RUE and scanning to Rt environment.  Pt required increased time and encouragement with tasks.  Therapy Documentation Precautions:  Precautions Precautions: Fall Precaution Comments: increased tone on right side (extensor in leg and flexor in arm), decreased midline orientation, dementia Restrictions Weight Bearing Restrictions: No Vital Signs: Therapy Vitals Temp: 98.3 F (36.8 C) Temp src: Oral Pulse Rate: 95 Resp: 18 BP: 112/77 mmHg Patient Position, if appropriate: Sitting Oxygen Therapy SpO2: 95 % O2 Device: None (Room air) Pain:  Pt with no c/o pain this session.  See FIM for current functional status  Therapy/Group: Individual Therapy  Leonette Monarch 06/16/2012, 3:12 PM

## 2012-06-17 ENCOUNTER — Inpatient Hospital Stay (HOSPITAL_COMMUNITY): Payer: Medicare Other | Admitting: *Deleted

## 2012-06-17 LAB — GLUCOSE, CAPILLARY
Glucose-Capillary: 98 mg/dL (ref 70–99)
Glucose-Capillary: 112 mg/dL — ABNORMAL HIGH (ref 70–99)
Glucose-Capillary: 103 mg/dL — ABNORMAL HIGH (ref 70–99)

## 2012-06-17 LAB — BASIC METABOLIC PANEL
CO2: 23 mEq/L (ref 19–32)
Calcium: 9.2 mg/dL (ref 8.4–10.5)
Chloride: 101 mEq/L (ref 96–112)
Glucose, Bld: 101 mg/dL — ABNORMAL HIGH (ref 70–99)
Potassium: 4 mEq/L (ref 3.5–5.1)
Sodium: 135 mEq/L (ref 135–145)

## 2012-06-17 LAB — URINALYSIS, ROUTINE W REFLEX MICROSCOPIC
Bilirubin Urine: NEGATIVE
Nitrite: POSITIVE — AB
Specific Gravity, Urine: 1.023 (ref 1.005–1.030)
Urobilinogen, UA: 0.2 mg/dL (ref 0.0–1.0)
pH: 6 (ref 5.0–8.0)

## 2012-06-17 LAB — CBC
Hemoglobin: 13.9 g/dL (ref 13.0–17.0)
MCH: 29.7 pg (ref 26.0–34.0)
MCV: 86.3 fL (ref 78.0–100.0)
Platelets: 260 10*3/uL (ref 150–400)
RBC: 4.68 MIL/uL (ref 4.22–5.81)
WBC: 10.9 10*3/uL — ABNORMAL HIGH (ref 4.0–10.5)

## 2012-06-17 LAB — URINE MICROSCOPIC-ADD ON

## 2012-06-17 MED ORDER — CIPROFLOXACIN HCL 250 MG PO TABS
250.0000 mg | ORAL_TABLET | Freq: Two times a day (BID) | ORAL | Status: DC
  Administered 2012-06-17 – 2012-06-19 (×5): 250 mg via ORAL
  Filled 2012-06-17 (×8): qty 1

## 2012-06-17 NOTE — Progress Notes (Addendum)
Patient was difficult to awaken during lunch time. Responds to questions by saying "uhuh" or nods. Vitals 97.7, 116/74 74 and 97% Oxygen on room air. Pushes your hand away when his face is wiped with cool washcloth continues to keep eyes shut. Rapid response called to look at the patient. MD aware with orders. Will continue to monitor.  Patient now opens his eyes when called at 1425 but still sleepy. Responds to questions. Lying comfortably in bed. Will continue to monitor.

## 2012-06-17 NOTE — Progress Notes (Signed)
Occupational Therapy Note  Patient Details  Name: Raymond Rangel MRN: [id number] Date of Birth: 19-Oct-1931 Today's Date: 06/17/2012 Time:  1530-1630  (60 min) Pain:  None Individual session  Pt. Lying in bed upon OT arrival.  Aroused pt and sat him on EOB with minimal assistance and max verbal directional cues.  Pt. Sat EOB with close supervision.  Performed scooting type transfer from bed to wc with moderate assist and max verbal directions for motor planning and right foot placement.  Sat pt at sink and did activities to force use right UE.  Pt washed hands, dried hands and reached for items.  He was more spontaneous in his movements.  Assisted pt to sit upright in wc and not slump.  Discussed with 2 daughters Herbert Seta and M)  later with pt in room about his progress with transfers, walking and using his right hand.  They were pleased to hear the progress.    [name] 06/17/2012, 5:35 PM

## 2012-06-17 NOTE — Progress Notes (Signed)
Patient's family members ( 2 daughters) complaining that patient did not eat for lunch when they came in at approximately 430 pm.They were upset that the tray was placed on top of the trash and that patient did not eat breakfast as well. RN and NT went to talk to the family. Noticed they were taking pictures of tray, swallow precautions.Marland KitchenMarland KitchenMelissa, NT started explaining the reason regarding patient not eating at this point in time, but they seem dissatisfied and continued arguing.  Patient had breakfast with RN/full supervision and ate 70% of his meal. At around lunch time, patient was very difficult to awaken and this was witnessed by 4 rn's, 2 nt's and rapid response. See RN note. Patient started to awaken at around 1425 but still feeling sleepy. Per swallow precaution, patient need to be awake and alert. At 330, patient was working with Erskine Squibb in therapy that ended approximately 415.  DInner came at exactly 5 pm. Kennyth Arnold, NT supervised patient with his meals.Therapist, Erskine Squibb was asked by RN to speak to them about his therapy and progress. RN to leave a note to nurse manager to discuss issue with family.

## 2012-06-17 NOTE — Progress Notes (Signed)
Patient ID: Raymond Rangel, male   DOB: 12-03-1931, 79 y.o.   MRN: [id number]  Subjective/Complaints: 79 y.o. right-handed male history of TIA, diabetes mellitus and dementia. 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 Admitted 05/26/2012 with right-sided weakness and slurred speech. MRI shows acute left PCA territory infarcts without hemorrhage. MRA of the head with 50% mid basilar stenosis. Proximal occlusion left PCA. Echocardiogram with ejection fraction 60% and grade 1 diastolic dysfunction. Carotid Dopplers with no ICA stenosis. Patient did not receive TPA. Neurology consulted patient maintained on aspirin 81 mg daily as prior to admission and Plavix added to regimen. Subcutaneous Lovenox for DVT prophylaxis. Currently patient is maintained on a dysphagia 1 thin liquid diet after modified barium swallow study 05/28/2012.Monitoring of cognition with noted history of dementia placed on Seroquel.  Slept ok Ate 90% lunch, 100% supper   Review of Systems  Review of Systems  Constitutional: Negative.   Respiratory: Positive for cough. Negative for hemoptysis, sputum production, shortness of breath and wheezing.        With meals  Gastrointestinal: Negative.   Neurological: Positive for focal weakness.  All other systems reviewed and are negative.    Objective: Vital Signs: Blood pressure 116/75, pulse 59, temperature 98.4 F (36.9 C), temperature source Oral, resp. rate 18, height 5\' 6"  (1.676 m), weight 61.78 kg (136 lb 3.2 oz), SpO2 95.00%. Dg Swallowing Func-speech Pathology  05/29/2012  [name], CCC-SLP     05/29/2012 11:55 AM   Recommend  to proceed with dysphagia 1 (puree) and thin liquids with full  assist with each meals to ensure safety.  Recommend puree mainly  due to severity of oral dysphagia.  Please note patient required  hand over hand assist for self feeding with majority of PO trials  administered by treating SLP.  Only trace silent aspiration   occurred with nectar thick liquids and x1 with thin liquid but  patient was in optimal upright positioning.       Results for orders placed during the hospital encounter of 05/30/12 (from the past 72 hour(s))  GLUCOSE, CAPILLARY     Status: Abnormal   Collection Time    06/14/12 11:37 AM      Result Value Range   Glucose-Capillary 68 (*) 70 - 99 mg/dL  GLUCOSE, CAPILLARY     Status: None   Collection Time    06/14/12  4:25 PM      Result Value Range   Glucose-Capillary 84  70 - 99 mg/dL  GLUCOSE, CAPILLARY     Status: None   Collection Time    06/14/12  9:09 PM      Result Value Range   Glucose-Capillary 96  70 - 99 mg/dL   Comment 1 Notify RN    GLUCOSE, CAPILLARY     Status: Abnormal   Collection Time    06/15/12  7:20 AM      Result Value Range   Glucose-Capillary 61 (*) 70 - 99 mg/dL  GLUCOSE, CAPILLARY     Status: None   Collection Time    06/15/12  7:49 AM      Result Value Range   Glucose-Capillary 88  70 - 99 mg/dL  GLUCOSE, CAPILLARY     Status: Abnormal   Collection Time    06/15/12 11:19 AM      Result Value Range   Glucose-Capillary 59 (*) 70 - 99 mg/dL  GLUCOSE, CAPILLARY     Status: Abnormal  Collection Time    06/15/12  4:48 PM      Result Value Range   Glucose-Capillary 53 (*) 70 - 99 mg/dL  GLUCOSE, CAPILLARY     Status: None   Collection Time    06/15/12  5:32 PM      Result Value Range   Glucose-Capillary 76  70 - 99 mg/dL  GLUCOSE, CAPILLARY     Status: None   Collection Time    06/16/12  5:17 AM      Result Value Range   Glucose-Capillary 99  70 - 99 mg/dL   Comment 1 Notify RN    GLUCOSE, CAPILLARY     Status: Abnormal   Collection Time    06/16/12  7:30 AM      Result Value Range   Glucose-Capillary 112 (*) 70 - 99 mg/dL  GLUCOSE, CAPILLARY     Status: Abnormal   Collection Time    06/16/12 11:32 AM      Result Value Range   Glucose-Capillary 104 (*) 70 - 99 mg/dL  GLUCOSE, CAPILLARY     Status: Abnormal   Collection Time     06/16/12  4:37 PM      Result Value Range   Glucose-Capillary 124 (*) 70 - 99 mg/dL  GLUCOSE, CAPILLARY     Status: None   Collection Time    06/16/12  9:02 PM      Result Value Range   Glucose-Capillary 96  70 - 99 mg/dL   Comment 1 Notify RN    GLUCOSE, CAPILLARY     Status: None   Collection Time    06/17/12  7:45 AM      Result Value Range   Glucose-Capillary 98  70 - 99 mg/dL     HEENT: normal Cardio: RRR Resp: CTA B/L GI: BS positive and Distention Extremity:  Pulses positive and No Edema Skin:   Intact Neuro: Alert/Oriented, Confused but   attentive today. Doesn't realize he's in the hospital or what has happened to him. Flat, Cranial Nerve II-XII normal, Abnormal Sensory Reduced sensation on Right but also has poor attention to testing, Abnormal Motor 2-/5 in RUE and 3-/5 RLE, 4/5 LUE, 3-/5 LLE and Tone:  increased flexor tone RUE, increased extensor tone RLE Musc/Skel:  Other Old scar L antecubital fossa, non tender Gen:  NAD Cognitive: oriented to person and to hospital but not date Righ homonymous hemianopsia persistent  Assessment/Plan: 1. Functional deficits secondary to Left PCA infarct which require 3+ hours per day of interdisciplinary therapy in a comprehensive inpatient rehab setting. Physiatrist is providing close team supervision and 24 hour management of active medical problems listed below. Physiatrist and rehab team continue to assess barriers to discharge/monitor patient progress toward functional and medical goals. FIM: FIM - Bathing Bathing Steps Patient Completed: Chest;Right Arm;Abdomen;Front perineal area;Right upper leg;Left upper leg Bathing: 3: Mod-Patient completes 5-7 9f 10 parts or 50-74%  FIM - Upper Body Dressing/Undressing Upper body dressing/undressing steps patient completed: Pull shirt over trunk;Thread/unthread left sleeve of pullover shirt/dress;Thread/unthread right sleeve of pullover shirt/dresss Upper body dressing/undressing: 4:  Min-Patient completed 75 plus % of tasks FIM - Lower Body Dressing/Undressing Lower body dressing/undressing steps patient completed: Thread/unthread left pants leg;Pull pants up/down Lower body dressing/undressing: 2: Max-Patient completed 25-49% of tasks  FIM - Toileting Toileting: 1: Two helpers  FIM - Diplomatic Services operational officer Devices: Grab bars Toilet Transfers: 2-To toilet/BSC: Max A (lift and lower assist);2-From toilet/BSC: Max A (lift and lower assist)  FIM - Banker Devices: Arm rests Bed/Chair Transfer: 2: Bed > Chair or W/C: Max A (lift and lower assist);2: Chair or W/C > Bed: Max A (lift and lower assist)  FIM - Locomotion: Wheelchair Distance: 15 Locomotion: Wheelchair: 1: Total Assistance/staff pushes wheelchair (Pt<25%) FIM - Locomotion: Ambulation Locomotion: Ambulation Assistive Devices: Other (comment) (hall rail on L) Ambulation/Gait Assistance: 1: +2 Total assist Locomotion: Ambulation: 1: Two helpers  Comprehension Comprehension Mode: Auditory Comprehension: 3-Understands basic 50 - 74% of the time/requires cueing 25 - 50%  of the time  Expression Expression Mode: Verbal Expression: 3-Expresses basic 50 - 74% of the time/requires cueing 25 - 50% of the time. Needs to repeat parts of sentences.  Social Interaction Social Interaction: 3-Interacts appropriately 50 - 74% of the time - May be physically or verbally inappropriate.  Problem Solving Problem Solving Mode: Not assessed Problem Solving: 1-Solves basic less than 25% of the time - needs direction nearly all the time or does not effectively solve problems and may need a restraint for safety  Memory Memory Mode: Not assessed Memory: 1-Recognizes or recalls less than 25% of the time/requires cueing greater than 75% of the time Medical Problem List and Plan:  1. embolic left PCA infarct  2. DVT Prophylaxis/Anticoagulation: Subcutaneous Lovenox.  Monitor platelet counts and any signs of bleeding Hx of Left femoral L popliteal,Left tibial subacute DVT diagnosed January 2013.lovenox 30 bid currently 3. Mood/dementia. Seroquel 25 mg twice a day, Ativan when necessary. Patient displayed baseline cognitive deficits per family. Will expect wax and wane.Trial Aricept- insomnia related to Aricept improved after a couple days will monitor 4. Neuropsych: This patient is not capable of making decisions on his/her own behalf.  5. Hyperlipidemia. Zocor  6. Diabetes mellitus with peripheral neuropathy. Hemoglobin A1c 6.7. Patient on diet control prior to admission. Check CBGs a.c. and at bedtime- fair control generally. Cover spikes with SSI for now. 7. Dysphagia. Dysphagia 1 thin liquids. Speech therapy followup. Monitor for any signs of aspiration.  8. UTI resolved 9.  Constipation nsg to give sorbitol 10.  Spasticity tone improved on Zanaflex but too sedated,titrate baclofen, not sedated,but increased tone    LOS (Days) 18 A FACE TO FACE EVALUATION WAS PERFORMED  KIRSTEINS,ANDREW E 06/17/2012, 10:10 AM

## 2012-06-18 ENCOUNTER — Inpatient Hospital Stay (HOSPITAL_COMMUNITY): Payer: Medicare Other | Admitting: Occupational Therapy

## 2012-06-18 ENCOUNTER — Inpatient Hospital Stay (HOSPITAL_COMMUNITY): Payer: Medicare Other

## 2012-06-18 ENCOUNTER — Inpatient Hospital Stay (HOSPITAL_COMMUNITY): Payer: Medicare Other | Admitting: Speech Pathology

## 2012-06-18 DIAGNOSIS — I6330 Cerebral infarction due to thrombosis of unspecified cerebral artery: Secondary | ICD-10-CM

## 2012-06-18 DIAGNOSIS — G8110 Spastic hemiplegia affecting unspecified side: Secondary | ICD-10-CM

## 2012-06-18 DIAGNOSIS — I69998 Other sequelae following unspecified cerebrovascular disease: Secondary | ICD-10-CM

## 2012-06-18 DIAGNOSIS — N390 Urinary tract infection, site not specified: Secondary | ICD-10-CM

## 2012-06-18 DIAGNOSIS — R209 Unspecified disturbances of skin sensation: Secondary | ICD-10-CM

## 2012-06-18 LAB — GLUCOSE, CAPILLARY
Glucose-Capillary: 105 mg/dL — ABNORMAL HIGH (ref 70–99)
Glucose-Capillary: 102 mg/dL — ABNORMAL HIGH (ref 70–99)
Glucose-Capillary: 124 mg/dL — ABNORMAL HIGH (ref 70–99)

## 2012-06-18 NOTE — Progress Notes (Signed)
Occupational Therapy Session Note  Patient Details  Name: Raymond Rangel MRN: [id number] Date of Birth: 1931-04-28  Today's Date: 06/18/2012 Time: 1000-1100 and 1100-1120 Time Calculation (min): 60 min and 20 min  Short Term Goals: Week 1:  OT Short Term Goal 1 (Week 1): Pt will roll in bed with mod assist to assist caregivers with clothing changes. OT Short Term Goal 1 - Progress (Week 1): Progressing toward goal OT Short Term Goal 2 (Week 1): Pt will sit on EOB with mod assist to engage in grooming tasks. OT Short Term Goal 2 - Progress (Week 1): Met OT Short Term Goal 3 (Week 1): Pt will don shirt with max assist. OT Short Term Goal 3 - Progress (Week 1): Met Week 2:  OT Short Term Goal 1 (Week 2): Pt will consistently transfer to the toilet using a squat pivot transfer with max assist x1. OT Short Term Goal 1 - Progress (Week 2): Met OT Short Term Goal 2 (Week 2): Pt will sit to stand from toilet and maintain standing with mod assist. OT Short Term Goal 2 - Progress (Week 2): Progressing toward goal OT Short Term Goal 3 (Week 2): Pt will demonstrate improved RUE motor control by bathing left arm with min assist. OT Short Term Goal 3 - Progress (Week 2): Met Week 3:  OT Short Term Goal 1 (Week 3): Pt will transfer to tub bench in tub with mod to max assist. OT Short Term Goal 2 (Week 3): Pt will transfer to the toilet with his daughter assisting him with mod to max assist. OT Short Term Goal 4 (Week 3): Pt will stand at toilet with mod assist so his daughter can assist him with clothing management.      Skilled Therapeutic Interventions/Progress Updates:    Visit 1: Pt scheduled for B/D this am with family education to prepare for discharge.  Pt was very lethargic on Sunday and continues to be. Even with max cues, pt would only open eyes slightly. He required total assist x 2 for all mobility and self care.  Pt has increased back extension push with posterior lean and he was not able to  activate his quads to extend legs in attempted sit to stand.  Pt had 5 family members present and he was not able to be alert with them.  Pt does have a UTI and is being treated. Pt's level of functioning has had a dramatic change in the last few days. At this point, pt is not able to engage enough for family education and will not be ready for a discharge on Wednesday.  When pt is more alert will work on family education with transfers.  Visit 2:  Pt seen during a cotx with PT.  Pt continued to be lethargic and nonengaged in tx.  We attempted sit to stand and ambulation, pt's knees were buckling and he was pushing back.  Due to his lethargy, tx focus changed to bed mobility.  Pt needed +2 to transfer to bed. In bed, demonstrated to family how to do rhythmic movement with bent knees moving side to side to relax tone.  Pt left in bed with call light, bed alarm set and family present.  Therapy Documentation Precautions:  Precautions Precautions: Fall Precaution Comments: increased tone on right side (extensor in leg and flexor in arm), decreased midline orientation, dementia Restrictions Weight Bearing Restrictions: No   Pain: Pain Assessment Pain Assessment: No/denies pain  See FIM for current functional status  Therapy/Group: Individual Therapy visit one and Co-tx visit two  SAGUIER,JULIA 06/18/2012, 11:44 AM

## 2012-06-18 NOTE — Progress Notes (Signed)
Speech Language Pathology Daily Session Note  Patient Details  Name: Raymond Rangel MRN: [id number] Date of Birth: 1931/09/23  Today's Date: 06/18/2012 Time: 3086-5784 Time Calculation (min): 40 min  Short Term Goals: Week 3: SLP Short Term Goal 1 (Week 3): Patient will follow 1 step directions during functional, familiar tasks with mod assist verbal and visual cues. SLP Short Term Goal 2 (Week 3): Patient will demonstrate basic problem solving during functional, familiar tasks with mod assist verbal and visual cues. SLP Short Term Goal 3 (Week 3): Patient will consume Dys.2 textures and thin liquid with min assist verbal, visual and tactile cues with no overt s/s of aspiration.  SLP Short Term Goal 4 (Week 3): Family will demonstrate 2 cuing strategies to utilize with patient durig completion of basic self care tasks.  Skilled Therapeutic Interventions: Skilled treatment session focused on addressing dysphagia and cognitive goals during a self-feeding task.  Patient was orientated by SLP.  SLP facilitated session with set-up of breakfast tray; mod assist tactile cues and max assist verbal cues to initiate self-feeding and sustain attention to task.  Patient demonstrated prolonged mastication of Dys.2 textures and required max assist verbal cues to alternate solids and liquids.  Patient exhibited no overt s/s of aspiration throughout meal.  Daughter present at end of session and SLP suggested she finish breakfast with him to see how much time and help he needs. SLP suspects UTI is impacting today's cognition.   FIM:  Comprehension Comprehension Mode: Auditory Comprehension: 2-Understands basic 25 - 49% of the time/requires cueing 51 - 75% of the time Expression Expression Mode: Verbal Expression: 2-Expresses basic 25 - 49% of the time/requires cueing 50 - 75% of the time. Uses single words/gestures. Social Interaction Social Interaction: 2-Interacts appropriately 25 - 49% of time - Needs  frequent redirection. Problem Solving Problem Solving: 1-Solves basic less than 25% of the time - needs direction nearly all the time or does not effectively solve problems and may need a restraint for safety Memory Memory: 1-Recognizes or recalls less than 25% of the time/requires cueing greater than 75% of the time FIM - Eating Eating Activity: 3: Helper scoops food on utensil every scoop;3: Helper brings food to mouth every scoop  Pain Pain Assessment Pain Assessment: No/denies pain  Therapy/Group: Individual Therapy  Charlane Ferretti., CCC-SLP [phone]  [name],[name] 06/18/2012, 1:08 PM

## 2012-06-18 NOTE — Progress Notes (Signed)
Urine cloudy with strong odor. UA positive, paged Dr. Wynn Banker with new orders. Condom cath, otherwise incontinent. Incontinent of BM. Raymond Rangel

## 2012-06-18 NOTE — Plan of Care (Signed)
Problem: RH BOWEL ELIMINATION Goal: RH STG MANAGE BOWEL WITH ASSISTANCE STG Manage Bowel with max assist timed toilet (prn meds only)  Outcome: Not Progressing Continue condom at all times  Problem: RH BLADDER ELIMINATION Goal: RH STG MANAGE BLADDER WITH ASSISTANCE STG Manage Bladder With max assist timed toileting  Outcome: Not Progressing Continues with incontinence and UTI

## 2012-06-18 NOTE — Progress Notes (Signed)
SLP Cancellation Note  Patient Details Name: Raymond Rangel MRN: [id number] DOB: Apr 12, 1931   Cancelled treatment:        Patient missed 30 minutes of skilled therapy due to fatigue impacting patient's ability to participate in group ([employer] treatment session with OT/SLP).    Fae Pippin, M.A., CCC-SLP [phone]  [name],[name] 06/18/2012, 1:09 PM

## 2012-06-18 NOTE — Progress Notes (Signed)
Physical Therapy Session Note  Patient Details  Name: Raymond Rangel MRN: [id number] Date of Birth: 1931-10-15  Today's Date: 06/18/2012 Time:1121-1140 and  1300-1330 Time Calculation (min): 19 min and 30 min  Short Term Goals: Week 3: = LTGs    Skilled Therapeutic Interventions/Progress Updates:   AM session:  co-treat with OT for skilled +2 transfers and gait.  Pt's 3 adult children here for family ed.  Pt extremely somnolent; unable to keep eyes open for 2 seconds.  Pt has recurrent UTI; started on ABX yesterday.  Baclofen held since yesterday due to somnolence.  sit> stand in hall using L rail, +2 assist,  in an attempt to increase arousal; pt unsafe, unable to extend LEs; pt lowered back into w/c  W/c> bed stand pivot transfer with +2 assist.  Bed mobility sit> supine total assist.  Family observed but were not trained due to pt's somnolence, and safety concerns.  Discussed with family pt's lethargy due to UTI.  PM tx:  Pt sleeping/snoring in bed.  Daughter worried because pt had not been awake enough to eat.  When questioned by PT, pt responded that he was hungry.    Supine> sit,  in an attempt to increase arousal; +2 assist, but still unsafe due to pt extending bil LEs, lack of LE support on floor.  Sit> supine total assist; +2 assist to scoot up in bed. Pt fell asleep quickly.  PT reassured daughter Raymond Rangel that pt needed sleep right now.  He did have breakfast today, per daughter (Ensure plus a little bit of eggs and grits).   Family ed for PROM bil LEs, focusing on R knee flexion, R ankle DF and toe extension.  Pt did not resist PROM.  Daughter Raymond Rangel return demonstrated safely.  PT recommended Raymond Rangel speak with RN about pt's recurrent UTIs; home management etc.     Therapy Documentation Precautions:  Precautions Precautions: Fall Precaution Comments: increased tone on right side (extensor in leg and flexor in arm), decreased midline orientation,  dementia Restrictions Weight Bearing Restrictions: No General: Amount of Missed PT Time (min): 15 Minutes Missed Time Reason: Patient ill (comment);Other (comment) (UTI; somnolent)   Pain: Pain Assessment Pain Assessment: No/denies pain      See FIM for current functional status  Therapy/Group: Individual Therapy and Co-Treatment  COOK,CAROLINE 06/18/2012, 1:43 PM

## 2012-06-18 NOTE — Progress Notes (Signed)
Social Work Patient ID: Raymond Rangel, male   DOB: 1932/02/25, 79 y.o.   MRN: [id number] Met with wife, son and two daughter's to answer questions regarding discharge.  Discussion regarding pt's care they do not want NHP but have difficulty with someone Being there who is able to physically assist.  Informed of home health coverage and private duty option.  Encouraged all to attend therapies with pt, to see how much assist he  Needs.  Concerned also regarding the UTI he has gotten over the weekend.  Still want a home eval and team is aware and will schedule with daughter.  Unsure if all of this Can be accomplished by Wed. Targeted discharge date.  Follow up with team and MD regarding medically readiness to be discharged Wed.

## 2012-06-18 NOTE — Progress Notes (Signed)
Patient ID: Raymond Rangel, male   DOB: Feb 10, 1932, 79 y.o.   MRN: [id number]  Subjective/Complaints: 79 y.o. right-handed male history of TIA, diabetes mellitus and dementia. 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 Admitted 05/26/2012 with right-sided weakness and slurred speech. MRI shows acute left PCA territory infarcts without hemorrhage. MRA of the head with 50% mid basilar stenosis. Proximal occlusion left PCA. Echocardiogram with ejection fraction 60% and grade 1 diastolic dysfunction. Carotid Dopplers with no ICA stenosis. Patient did not receive TPA. Neurology consulted patient maintained on aspirin 81 mg daily as prior to admission and Plavix added to regimen. Subcutaneous Lovenox for DVT prophylaxis. Currently patient is maintained on a dysphagia 1 thin liquid diet after modified barium swallow study 05/28/2012.Monitoring of cognition with noted history of dementia placed on Seroquel.  Somnolent yesterday, baclofen held and Labs check, UA look like UTI, CBC and BMET ok    Review of Systems  Review of Systems  Constitutional: Negative.   Respiratory: Positive for cough. Negative for hemoptysis, sputum production, shortness of breath and wheezing.        With meals  Gastrointestinal: Negative.   Neurological: Positive for focal weakness.  All other systems reviewed and are negative.    Objective: Vital Signs: Blood pressure 118/81, pulse 94, temperature 97.7 F (36.5 C), temperature source Oral, resp. rate 16, height 5\' 6"  (1.676 m), weight 61.78 kg (136 lb 3.2 oz), SpO2 97.00%. Dg Swallowing Func-speech Pathology  05/29/2012  [name], CCC-SLP     05/29/2012 11:55 AM   Recommend  to proceed with dysphagia 1 (puree) and thin liquids with full  assist with each meals to ensure safety.  Recommend puree mainly  due to severity of oral dysphagia.  Please note patient required  hand over hand assist for self feeding with majority of PO trials  administered  by treating SLP.  Only trace silent aspiration  occurred with nectar thick liquids and x1 with thin liquid but  patient was in optimal upright positioning.       Results for orders placed during the hospital encounter of 05/30/12 (from the past 72 hour(s))  GLUCOSE, CAPILLARY     Status: Abnormal   Collection Time    06/15/12 11:19 AM      Result Value Range   Glucose-Capillary 59 (*) 70 - 99 mg/dL  GLUCOSE, CAPILLARY     Status: Abnormal   Collection Time    06/15/12  4:48 PM      Result Value Range   Glucose-Capillary 53 (*) 70 - 99 mg/dL  GLUCOSE, CAPILLARY     Status: None   Collection Time    06/15/12  5:32 PM      Result Value Range   Glucose-Capillary 76  70 - 99 mg/dL  GLUCOSE, CAPILLARY     Status: None   Collection Time    06/16/12  5:17 AM      Result Value Range   Glucose-Capillary 99  70 - 99 mg/dL   Comment 1 Notify RN    GLUCOSE, CAPILLARY     Status: Abnormal   Collection Time    06/16/12  7:30 AM      Result Value Range   Glucose-Capillary 112 (*) 70 - 99 mg/dL  GLUCOSE, CAPILLARY     Status: Abnormal   Collection Time    06/16/12 11:32 AM      Result Value Range   Glucose-Capillary 104 (*) 70 - 99 mg/dL  GLUCOSE,  CAPILLARY     Status: Abnormal   Collection Time    06/16/12  4:37 PM      Result Value Range   Glucose-Capillary 124 (*) 70 - 99 mg/dL  GLUCOSE, CAPILLARY     Status: None   Collection Time    06/16/12  9:02 PM      Result Value Range   Glucose-Capillary 96  70 - 99 mg/dL   Comment 1 Notify RN    GLUCOSE, CAPILLARY     Status: None   Collection Time    06/17/12  7:45 AM      Result Value Range   Glucose-Capillary 98  70 - 99 mg/dL  GLUCOSE, CAPILLARY     Status: Abnormal   Collection Time    06/17/12 11:56 AM      Result Value Range   Glucose-Capillary 112 (*) 70 - 99 mg/dL  GLUCOSE, CAPILLARY     Status: Abnormal   Collection Time    06/17/12  1:19 PM      Result Value Range   Glucose-Capillary 101 (*) 70 - 99 mg/dL   Comment 1  Notify RN    BASIC METABOLIC PANEL     Status: Abnormal   Collection Time    06/17/12  3:24 PM      Result Value Range   Sodium 135  135 - 145 mEq/L   Potassium 4.0  3.5 - 5.1 mEq/L   Chloride 101  96 - 112 mEq/L   CO2 23  19 - 32 mEq/L   Glucose, Bld 101 (*) 70 - 99 mg/dL   BUN 17  6 - 23 mg/dL   Creatinine, Ser 9.56  0.50 - 1.35 mg/dL   Calcium 9.2  8.4 - 21.3 mg/dL   GFR calc non Af Amer 77 (*) >90 mL/min   GFR calc Af Amer 89 (*) >90 mL/min   Comment:            The eGFR has been calculated     using the CKD EPI equation.     This calculation has not been     validated in all clinical     situations.     eGFR's persistently     <90 mL/min signify     possible Chronic Kidney Disease.  CBC     Status: Abnormal   Collection Time    06/17/12  3:24 PM      Result Value Range   WBC 10.9 (*) 4.0 - 10.5 K/uL   RBC 4.68  4.22 - 5.81 MIL/uL   Hemoglobin 13.9  13.0 - 17.0 g/dL   HCT 08.6  57.8 - 46.9 %   MCV 86.3  78.0 - 100.0 fL   MCH 29.7  26.0 - 34.0 pg   MCHC 34.4  30.0 - 36.0 g/dL   RDW 62.9  52.8 - 41.3 %   Platelets 260  150 - 400 K/uL  GLUCOSE, CAPILLARY     Status: None   Collection Time    06/17/12  4:10 PM      Result Value Range   Glucose-Capillary 87  70 - 99 mg/dL  URINALYSIS, ROUTINE W REFLEX MICROSCOPIC     Status: Abnormal   Collection Time    06/17/12  7:10 PM      Result Value Range   Color, Urine YELLOW  YELLOW   APPearance TURBID (*) CLEAR   Specific Gravity, Urine 1.023  1.005 - 1.030   pH 6.0  5.0 -  8.0   Glucose, UA NEGATIVE  NEGATIVE mg/dL   Hgb urine dipstick LARGE (*) NEGATIVE   Bilirubin Urine NEGATIVE  NEGATIVE   Ketones, ur NEGATIVE  NEGATIVE mg/dL   Protein, ur 30 (*) NEGATIVE mg/dL   Urobilinogen, UA 0.2  0.0 - 1.0 mg/dL   Nitrite POSITIVE (*) NEGATIVE   Leukocytes, UA LARGE (*) NEGATIVE  URINE MICROSCOPIC-ADD ON     Status: Abnormal   Collection Time    06/17/12  7:10 PM      Result Value Range   WBC, UA TOO NUMEROUS TO COUNT  <3  WBC/hpf   RBC / HPF TOO NUMEROUS TO COUNT  <3 RBC/hpf   Bacteria, UA MANY (*) RARE  GLUCOSE, CAPILLARY     Status: Abnormal   Collection Time    06/17/12  9:37 PM      Result Value Range   Glucose-Capillary 103 (*) 70 - 99 mg/dL     HEENT: normal Cardio: RRR Resp: CTA B/L GI: BS positive and Distention Extremity:  Pulses positive and No Edema Skin:   Intact Neuro: Alert/Oriented, Confused but   attentive today. Doesn't realize he's in the hospital or what has happened to him. Flat, Cranial Nerve II-XII normal, Abnormal Sensory Reduced sensation on Right but also has poor attention to testing, Abnormal Motor 2-/5 in RUE and 3-/5 RLE, 4/5 LUE, 3-/5 LLE and Tone:  increased flexor tone RUE, increased extensor tone RLE Musc/Skel:  Other Old scar L antecubital fossa, non tender Gen:  NAD Cognitive: oriented to person and to hospital but not date Righ homonymous hemianopsia persistent  Assessment/Plan: 1. Functional deficits secondary to Left PCA infarct which require 3+ hours per day of interdisciplinary therapy in a comprehensive inpatient rehab setting. Physiatrist is providing close team supervision and 24 hour management of active medical problems listed below. Physiatrist and rehab team continue to assess barriers to discharge/monitor patient progress toward functional and medical goals. FIM: FIM - Bathing Bathing Steps Patient Completed: Chest;Right Arm;Abdomen;Front perineal area;Right upper leg;Left upper leg Bathing: 1: Total-Patient completes 0-2 of 10 parts or less than 25%  FIM - Upper Body Dressing/Undressing Upper body dressing/undressing steps patient completed: Pull shirt over trunk;Thread/unthread left sleeve of pullover shirt/dress;Thread/unthread right sleeve of pullover shirt/dresss Upper body dressing/undressing: 0: Wears gown/pajamas-no public clothing FIM - Lower Body Dressing/Undressing Lower body dressing/undressing steps patient completed: Thread/unthread left  pants leg;Pull pants up/down Lower body dressing/undressing: 0: Wears Oceanographer  FIM - Toileting Toileting: 1: Two helpers  FIM - Diplomatic Services operational officer Devices: Therapist, music Transfers: 2-To toilet/BSC: Max A (lift and lower assist);2-From toilet/BSC: Max A (lift and lower assist)  FIM - Press photographer Assistive Devices: Arm rests Bed/Chair Transfer: 3: Supine > Sit: Mod A (lifting assist/Pt. 50-74%/lift 2 legs;3: Bed > Chair or W/C: Mod A (lift or lower assist)  FIM - Locomotion: Wheelchair Distance: 15 Locomotion: Wheelchair: 1: Total Assistance/staff pushes wheelchair (Pt<25%) FIM - Locomotion: Ambulation Locomotion: Ambulation Assistive Devices: Other (comment) (hall rail on L) Ambulation/Gait Assistance: 1: +2 Total assist Locomotion: Ambulation: 1: Two helpers  Comprehension Comprehension Mode: Auditory Comprehension: 3-Understands basic 50 - 74% of the time/requires cueing 25 - 50%  of the time  Expression Expression Mode: Verbal Expression: 3-Expresses basic 50 - 74% of the time/requires cueing 25 - 50% of the time. Needs to repeat parts of sentences.  Social Interaction Social Interaction: 3-Interacts appropriately 50 - 74% of the time - May be physically or verbally  inappropriate.  Problem Solving Problem Solving Mode: Not assessed Problem Solving: 1-Solves basic less than 25% of the time - needs direction nearly all the time or does not effectively solve problems and may need a restraint for safety  Memory Memory Mode: Not assessed Memory: 1-Recognizes or recalls less than 25% of the time/requires cueing greater than 75% of the time Medical Problem List and Plan:  1. embolic left PCA infarct  2. DVT Prophylaxis/Anticoagulation: Subcutaneous Lovenox. Monitor platelet counts and any signs of bleeding Hx of Left femoral L popliteal,Left tibial subacute DVT diagnosed January 2013.lovenox 30 bid  currently 3. Mood/dementia. Seroquel 25 mg twice a day, Ativan when necessary. Patient displayed baseline cognitive deficits per family. Will expect wax and wane.Trial Aricept- insomnia related to Aricept improved after a couple days will monitor 4. Neuropsych: This patient is not capable of making decisions on his/her own behalf.  5. Hyperlipidemia. Zocor  6. Diabetes mellitus with peripheral neuropathy. Hemoglobin A1c 6.7. Patient on diet control prior to admission. Check CBGs a.c. and at bedtime- fair control generally. Cover spikes with SSI for now. 7. Dysphagia. Dysphagia 1 thin liquids. Speech therapy followup. Monitor for any signs of aspiration.  8. UTI recurrent now on Cipro, await sensitivity 9.  Constipation nsg to give sorbitol 10.  Spasticity tone improved on Zanaflex but too sedated,titrate baclofen, also sedated,now held, tone looks ok but if it increases again will need to resume at a lower dose (ie BID)    LOS (Days) 19 A FACE TO FACE EVALUATION WAS PERFORMED  KIRSTEINS,ANDREW E 06/18/2012, 8:12 AM

## 2012-06-19 ENCOUNTER — Inpatient Hospital Stay (HOSPITAL_COMMUNITY): Payer: Medicare Other | Admitting: Speech Pathology

## 2012-06-19 ENCOUNTER — Inpatient Hospital Stay (HOSPITAL_COMMUNITY): Payer: Medicare Other | Admitting: Occupational Therapy

## 2012-06-19 ENCOUNTER — Inpatient Hospital Stay (HOSPITAL_COMMUNITY): Payer: Medicare Other | Admitting: Physical Therapy

## 2012-06-19 LAB — URINE CULTURE

## 2012-06-19 LAB — GLUCOSE, CAPILLARY
Glucose-Capillary: 105 mg/dL — ABNORMAL HIGH (ref 70–99)
Glucose-Capillary: 142 mg/dL — ABNORMAL HIGH (ref 70–99)

## 2012-06-19 NOTE — Progress Notes (Signed)
Physical Therapy Session Note  Patient Details  Name: Raymond Rangel MRN: [id number] Date of Birth: 18-Aug-1931  Today's Date: 06/19/2012 Time: 4098-1191 Time Calculation (min): 18 min  Short Term Goals: Week 1:  PT Short Term Goal 1 (Week 1): Pt will perform bed mobility with mod assist. PT Short Term Goal 1 - Progress (Week 1): Met PT Short Term Goal 2 (Week 1): Pt will transfer bed><w/c to L with mod assist consistently. PT Short Term Goal 2 - Progress (Week 1): Not met PT Short Term Goal 3 (Week 1): Pt will perform gait x 25' with +2 assist. PT Short Term Goal 3 - Progress (Week 1): Met PT Short Term Goal 4 (Week 1): Pt will stand x 2 minutes during functional task with mod assist. PT Short Term Goal 4 - Progress (Week 1): Not met Week 2:  PT Short Term Goal 1 (Week 2): Pt will move sidelying > sit with min assist 50% of attempts. PT Short Term Goal 1 - Progress (Week 2): Partly met PT Short Term Goal 2 (Week 2): Pt will perform transfer bed>< w/c to R with mod assist 50% of attempts. PT Short Term Goal 2 - Progress (Week 2): Partly met PT Short Term Goal 3 (Week 2): Pt will perform gait x 25' with LRAD with assist of 1 person. PT Short Term Goal 3 - Progress (Week 2): Not met PT Short Term Goal 4 (Week 2): Pt will perform gait x 25' with LRAD with assist of 1 person. PT Short Term Goal 5 (Week 2): Pt will stand x 5 minutes during functional task with max assist. PT Short Term Goal 5 - Progress (Week 2): Met  Skilled Therapeutic Interventions/Progress Updates:   Upon entering room patient in bed, patient awake and responding with "uh huh".  When cued to transition to EOB patient unable to initiate or respond to cues.  Patien's vitals taken: see below.  RN alerted to unstable vitals.  Assisted nurse tech with repositioning patient in bed and RN with removing patient's flannel shirt; patient extremely rigid and not assisting at all.  Patient to remain in bed secondary to fever and unstable  vitals.  Missed 12 minutes PT. Will re-assess tomorrow.  Therapy Documentation Precautions:  Precautions Precautions: Fall Precaution Comments: increased tone on right side (extensor in leg and flexor in arm), decreased midline orientation, dementia Restrictions Weight Bearing Restrictions: No Vital Signs: Therapy Vitals Temp: 102.9 F (39.4 C) Temp src: Oral Pulse Rate: 123 Resp: 17 BP: 115/76 mmHg Patient Position, if appropriate: Lying Oxygen Therapy SpO2: 62 % O2 Device: Nasal cannula O2 Flow Rate (L/min): 2 L/min Pulse Oximetry Type: Intermittent Pain: Pain Assessment Pain Assessment: No/denies pain See FIM for current functional status  Therapy/Group: Individual Therapy  Edman Circle [facility] 06/19/2012, 4:25 PM

## 2012-06-19 NOTE — Progress Notes (Signed)
NUTRITION FOLLOW UP  DOCUMENTATION CODES  Per approved criteria   -Non-severe malnutrition in the context of chronic illness    Intervention:   1. Continue to encourage supplements (Ensure Complete) to help with overall oral intake. Offer meal alternatives if patient refuses. 2. RD to continue to follow nutrition care plan  Nutrition Dx:   Inadequate oral intake related to decreased appetite as evidenced by meal completion 50%.  Ongoing with UTI.  Goal:   Pt to meet >/= 90% of their estimated nutrition needs. Not met.  Monitor:   PO intake, supplement acceptance, labs  Assessment:   Working towards d/c of 3/12.  Low CBG 3/7. Pt was noted to be too lethargic to safely eat on 3/9, work-up revealed UTI. PO intake is reduced likely 2/2 UTI.  Per SLP note yesterday, pt demonstrated prolonged mastication of Dys.2 textures and required max assist verbal cues to alternate solids and liquids. Patient exhibited no overt s/s of aspiration throughout meal. SLP suspects UTI is impacting today's cognition.    Height: Ht Readings from Last 1 Encounters:  06/13/12 5\' 6"  (1.676 m)    Weight Status:   Wt Readings from Last 1 Encounters:  06/13/12 136 lb 3.2 oz (61.78 kg)   Re-estimated needs:  Kcal: 1600 - 1800 Protein: 75 - 85 grams Fluid: at least 1.6 liters daily  Skin: intact  Diet Order: Dysphagia 2 with thin liquids   Intake/Output Summary (Last 24 hours) at 06/19/12 1049 Last data filed at 06/18/12 1808  Gross per 24 hour  Intake    620 ml  Output    400 ml  Net    220 ml    Last BM: 3/10   Labs:   Recent Labs Lab 06/13/12 0750 06/17/12 1524  NA  --  135  K  --  4.0  CL  --  101  CO2  --  23  BUN  --  17  CREATININE 0.87 0.97  CALCIUM  --  9.2  GLUCOSE  --  101*    CBG (last 3)   Recent Labs  06/18/12 1640 06/18/12 2106 06/19/12 0706  GLUCAP 102* 124* 105*    Scheduled Meds: . aspirin  81 mg Oral Daily  . ciprofloxacin  250 mg Oral BID  .  clopidogrel  75 mg Oral Q breakfast  . donepezil  5 mg Oral QHS  . enoxaparin (LOVENOX) injection  30 mg Subcutaneous Q12H  . feeding supplement  237 mL Oral BID BM  . QUEtiapine  25 mg Oral BID  . simvastatin  40 mg Oral QHS    Continuous Infusions:  none  Jarold Motto MS, RD, LDN Pager: [phone] After-hours pager: [phone]

## 2012-06-19 NOTE — Progress Notes (Signed)
Social Work Patient ID: Raymond Rangel, male   DOB: [birth year], 79 y.o.   MRN: [id number] Spoke with MD regarding pt missing therapies yesterday due to UTI.  He is in agreement of pt staying another day to complete family education and  Make sure his UTI is treated with right antibiotic.  Informed daughter-[name] who is here to do family education of this and she agrees.  She hopes her sister Comes today also to learn dad's care since she will be there at night with Mom and Dad.  Pt requires a hospital bed due to elevation and transfers.  He is unable To use a wedge it is not effective.  He also requires a lightweight wheelchair to self propel and uses for self care, he is unable to self propel a standard wheelchair. Referral made to [facility] are to contact daughter to arrange delivery.  Continue with family education and hope other children come in and learn his care, since they will Be assisting at home.

## 2012-06-19 NOTE — Progress Notes (Signed)
Physical Therapy Session Note  Patient Details  Name: Raymond Rangel MRN: [id number] Date of Birth: 01-10-1932  Today's Date: 06/19/2012 Time: 1130-1200 Time Calculation (min): 30 min  Short Term Goals: Week 3: LTGs    Skilled Therapeutic Interventions/Progress Updates:  Daughter [name] here for family ed.  OT reported that pt's LUE had been shaking; awaiting RN to assess pt.  O2 sats via dynamap = 81-85; 2 L O2 started; O2 sats were very erratic when checked after 2 minutes.  Pt appeared ill, but not in pain.  BP sitting = 130/85; HR 100.    Nurse assessed pt in room.  Pt continues with severe UTI.  Difficult to arouse, unable to follow 1 step commands.  Discussed pt's status and UTI with pt's  daughter.    W/c> bed +2 skilled assist stand pivot, pt total assist.  Sit> supine and scooting to HOB +2 assist.  Pt too ill and requiring too much assistance for training daughter in transfer this AM.  O2 on [place] at 2L/min; pt asleep very quickly.    Therapy Documentation Precautions:  Precautions Precautions: Fall Precaution Comments: increased tone on right side (extensor in leg and flexor in arm), decreased midline orientation, dementia Restrictions Weight Bearing Restrictions: No   Pain: Pain Assessment Pain Assessment: No/denies pain      See FIM for current functional status  Therapy/Group: Individual Therapy  COOK,CAROLINE 06/19/2012, 2:42 PM

## 2012-06-19 NOTE — Progress Notes (Signed)
SLP Cancellation Note  Patient Details Name: Raymond Rangel MRN: [id number] DOB: 10-Jun-1931   Cancelled treatment:        Patient missed 30 minutes of skilled group therapy with OT/SLP ([employer]) due to fatigue and inability to safely participate.   Fae Pippin, M.A., CCC-SLP [phone]  [name],[name] 06/19/2012, 1:19 PM

## 2012-06-19 NOTE — Progress Notes (Signed)
Speech Language Pathology Daily Session Note  Patient Details  Name: Raymond Rangel MRN: [id number] Date of Birth: [birth year]  Today's Date: 06/19/2012 Time: 1478-2956 Time Calculation (min): 45 min  Short Term Goals: Week 3: SLP Short Term Goal 1 (Week 3): Patient will follow 1 step directions during functional, familiar tasks with mod assist verbal and visual cues. SLP Short Term Goal 2 (Week 3): Patient will demonstrate basic problem solving during functional, familiar tasks with mod assist verbal and visual cues. SLP Short Term Goal 3 (Week 3): Patient will consume Dys.2 textures and thin liquid with min assist verbal, visual and tactile cues with no overt s/s of aspiration.  SLP Short Term Goal 4 (Week 3): Family will demonstrate 2 cuing strategies to utilize with patient durig completion of basic self care tasks.  Skilled Therapeutic Interventions: Skilled treatment session focused on addressing dysphagia and cognitive goals during a self-care task. Initially patient declined therapy and stated he wanted to stay in bed; however, with encouragement SLP and RN assisted patient with bed to chair transfer.  After set-up patient required mod assist tactile cues and max assist verbal/directional cues to attend to, initiate and problem solve self-feeding.  Patient exhibited no overt s/s of aspiration with consumption of Dys.2 textures and thin liquids.  Daughter present for end of session and provided verbal distraction that impacted functional self-feeding; as a result, SLP educated daughter on the impact of this on his ability to attend to self-feeding.     FIM:  Comprehension Comprehension Mode: Auditory Comprehension: 2-Understands basic 25 - 49% of the time/requires cueing 51 - 75% of the time Expression Expression Mode: Verbal Expression: 2-Expresses basic 25 - 49% of the time/requires cueing 50 - 75% of the time. Uses single words/gestures. Social Interaction Social Interaction:  2-Interacts appropriately 25 - 49% of time - Needs frequent redirection. Problem Solving Problem Solving: 1-Solves basic less than 25% of the time - needs direction nearly all the time or does not effectively solve problems and may need a restraint for safety Memory Memory: 1-Recognizes or recalls less than 25% of the time/requires cueing greater than 75% of the time FIM - Eating Eating Activity: 3: Helper scoops food on utensil every scoop  Pain Pain Assessment Pain Assessment: No/denies pain  Therapy/Group: Individual Therapy  Charlane Ferretti., CCC-SLP [phone]  Paulanthony Gleaves 06/19/2012, 1:28 PM

## 2012-06-19 NOTE — Progress Notes (Signed)
Patient's temp reevaluated.  Current temp 98.6 orally.  Patient was changed from sweats into hospital gown.  Room cool.  Family at bedside.  No distress. Will monitor for changes.   Raymond Rangel

## 2012-06-19 NOTE — Progress Notes (Signed)
Occupational Therapy Session Note  Patient Details  Name: Kemonte Ullman MRN: [id number] Date of Birth: [birth year]  Today's Date: 06/19/2012 Time: 1000-1100 Time Calculation (min): 60 min  Short Term Goals: Week 1:  OT Short Term Goal 1 (Week 1): Pt will roll in bed with mod assist to assist caregivers with clothing changes. OT Short Term Goal 1 - Progress (Week 1): Progressing toward goal OT Short Term Goal 2 (Week 1): Pt will sit on EOB with mod assist to engage in grooming tasks. OT Short Term Goal 2 - Progress (Week 1): Met OT Short Term Goal 3 (Week 1): Pt will don shirt with max assist. OT Short Term Goal 3 - Progress (Week 1): Met Week 2:  OT Short Term Goal 1 (Week 2): Pt will consistently transfer to the toilet using a squat pivot transfer with max assist x1. OT Short Term Goal 1 - Progress (Week 2): Met OT Short Term Goal 2 (Week 2): Pt will sit to stand from toilet and maintain standing with mod assist. OT Short Term Goal 2 - Progress (Week 2): Progressing toward goal OT Short Term Goal 3 (Week 2): Pt will demonstrate improved RUE motor control by bathing left arm with min assist. OT Short Term Goal 3 - Progress (Week 2): Met Week 3:  OT Short Term Goal 1 (Week 3): Pt will transfer to tub bench in tub with mod to max assist. OT Short Term Goal 2 (Week 3): Pt will transfer to the toilet with his daughter assisting him with mod to max assist. OT Short Term Goal 4 (Week 3): Pt will stand at toilet with mod assist so his daughter can assist him with clothing management.     Skilled Therapeutic Interventions/Progress Updates:     No c/o pain this session.  Pt seen for BADL retraining with family education with his daughter. Pt was more alert than yesterday but was not able to engage in therapy well. He continually closed his eyes and said "I dont know".  Pt required total assist +2 for bathing, dressing, standing. Pt stated that he needed to urinate. His daughter and I attempted to  transfer him to the toilet but he pushed into extreme extension in his trunk and BLEs and we were not able to safely transfer him. He was given urinal and needed assist to position it.  Attempted to work on trunk mobility and tone reduction from w/c with side to side rocking and forward flexion but patient had extreme tone (or possibly just resistance) in his trunk and it was extremely difficult to mobilize him.  Discussed with daughter that she will have to have full time help to assist her as he will have to have a 2 person assist unless he progresses dramatically.  She is trying to find help.  Pt left in w/c with safety belt and his daughter in room.   Therapy Documentation Precautions:  Precautions Precautions: Fall Precaution Comments: increased tone on right side (extensor in leg and flexor in arm), decreased midline orientation, dementia Restrictions Weight Bearing Restrictions: No    ADL:  See FIM for current functional status  Therapy/Group: Individual Therapy  SAGUIER,JULIA 06/19/2012, 11:56 AM

## 2012-06-19 NOTE — Progress Notes (Signed)
Notified of patient's increased temp and unstable vital signs.  Patient presented sitting in bed with flannel shirt, over cotton T-shirt, with sweat pants.  Room warm.  Air turned on to cool room.  Flannel shirt removed.  Administered Tylenol as directed.  Patient with non labored  Respirations.  Chest expansion symmetrical.  Denies chest pain or shortness of breath.  MM moist and pink.  No signs of distress indicated.  Consumed a full 6 OZ cup of ice water without difficulty.  Will monitor for changes in condition.  No family at bedside at this time.     Kelli Hope M

## 2012-06-20 ENCOUNTER — Inpatient Hospital Stay (HOSPITAL_COMMUNITY): Payer: Medicare Other | Admitting: Occupational Therapy

## 2012-06-20 ENCOUNTER — Inpatient Hospital Stay (HOSPITAL_COMMUNITY): Payer: Medicare Other

## 2012-06-20 ENCOUNTER — Inpatient Hospital Stay (HOSPITAL_COMMUNITY): Payer: Medicare Other | Admitting: Speech Pathology

## 2012-06-20 LAB — CBC WITH DIFFERENTIAL/PLATELET
Basophils Absolute: 0 10*3/uL (ref 0.0–0.1)
Eosinophils Absolute: 0.4 10*3/uL (ref 0.0–0.7)
HCT: 42.2 % (ref 39.0–52.0)
Lymphocytes Relative: 14 % (ref 12–46)
MCHC: 34.4 g/dL (ref 30.0–36.0)
Monocytes Relative: 7 % (ref 3–12)
Neutro Abs: 11.1 10*3/uL — ABNORMAL HIGH (ref 1.7–7.7)
Neutrophils Relative %: 76 % (ref 43–77)
Platelets: 296 10*3/uL (ref 150–400)
RDW: 15.1 % (ref 11.5–15.5)
WBC: 14.5 10*3/uL — ABNORMAL HIGH (ref 4.0–10.5)

## 2012-06-20 LAB — GLUCOSE, CAPILLARY
Glucose-Capillary: 105 mg/dL — ABNORMAL HIGH (ref 70–99)
Glucose-Capillary: 94 mg/dL (ref 70–99)
Glucose-Capillary: 99 mg/dL (ref 70–99)

## 2012-06-20 LAB — CREATININE, SERUM
Creatinine, Ser: 0.97 mg/dL (ref 0.50–1.35)
GFR calc Af Amer: 89 mL/min — ABNORMAL LOW (ref >90)
GFR calc non Af Amer: 77 mL/min — ABNORMAL LOW (ref >90)

## 2012-06-20 MED ORDER — NITROFURANTOIN MONOHYD MACRO 100 MG PO CAPS
100.0000 mg | ORAL_CAPSULE | Freq: Two times a day (BID) | ORAL | Status: DC
  Administered 2012-06-20 – 2012-06-25 (×11): 100 mg via ORAL
  Filled 2012-06-20 (×13): qty 1

## 2012-06-20 NOTE — Patient Care Conference (Signed)
Inpatient RehabilitationTeam Conference and Plan of Care Update Date: 06/20/2012   Time: 10:38 AM    Patient Name: Raymond Rangel      Medical Record Number: [id number]  Date of Birth: [birth year] Sex: Male         Room/Bed: 4037/4037-01 Payor Info: Payor: Advertising copywriter MEDICARE  Plan: [employer] MEDICARE COMPLETE  Product Type: *No Product type*     Admitting Diagnosis: LT CVA  Admit Date/Time:  05/30/2012  5:01 PM Admission Comments: No comment available   Primary Diagnosis:  CVA (cerebral infarction) Principal Problem: CVA (cerebral infarction)  Patient Active Problem List   Diagnosis Date Noted  . Heat stroke 05/31/2012  . Homonymous hemianopsia due to recent stroke 05/31/2012  . CVA (cerebral infarction) 05/26/2012  . Diabetes mellitus, type 2 07/29/2011  . Right shoulder pain 05/02/2011  . DVT (deep venous thrombosis) 05/02/2011  . CAD (coronary artery disease) 05/02/2011  . Hyperlipidemia 05/02/2011  . Dementia 05/02/2011    Expected Discharge Date: Expected Discharge Date: 06/25/12  Team Members Present: Physician leading conference: Dr. Claudette Laws Nurse Present: Laural Roes, RN PT Present: Other (comment);Wanda Plump, Varney Biles, PT (Bridget Ripa-PT) OT Present: Bretta Bang, Verlene Mayer, OT SLP Present: Fae Pippin, SLP Other (Discipline and Name): Charolette Child Coordinator     Current Status/Progress Goal Weekly Team Focus  Medical   UTI, severe dementia, lethargy  Improve transfers  Tx UTI, family training   Bowel/Bladder   incontinent of bowel/bladder. condom cath at night, brief at all times. lbm 3/11  continent during the day with timed toileting  timed toilet q2hours and condom cath at night    Swallow/Nutrition/ Hydration   Dys.2 textures and thin liquids with full supervision mod-max assist  least restrictive p.o. intake  cont. family education   ADL's   Pt was making progress at end of week. At start of this week, he is total assist  x2 for all self care and mobility  mod assist with self care and transfers  ADL retraining, functional mobility, RUE coordination, family education   Mobility   +2 transfers and bed mobility; gait in hall x 25' with mod assist  to be revised after conference  mobility, family ed if pt improves   Communication   mod assist  min assist   family education   Safety/Cognition/ Behavioral Observations  max assist  mod assist  family education   Pain   no c/o pain  3 or less 1-10 scale   monitor patient for new onset of pain   Skin   C.D.I.  skin free of infection/breakdown  monitor skin, use protective skin care products, qshift      *See Interdisciplinary Assessment and Plan and progress notes for long and short-term goals  Barriers to Discharge: fever    Possible Resolutions to Barriers:  cont medical and rehab management extend stay    Discharge Planning/Teaching Needs:  Family here attempting to do faily educaiton, pt too lethargic at times from UTI.  Discuss in conference readiness for discharge tomorrow.      Team Discussion:  Needs more family training due to loss days in therapies because of UTI and fever.  Chest x-ray today. Tone in leg limits his transfers.  Less lethargic today hopeful will be better tomorrow.  Revisions to Treatment Plan:  Extended stay due to loss four days of therapy with UTI.  Continue family training   Continued Need for Acute Rehabilitation Level of Care: The patient requires daily medical management  by a physician with specialized training in physical medicine and rehabilitation for the following conditions: Daily direction of a multidisciplinary physical rehabilitation program to ensure safe treatment while eliciting the highest outcome that is of practical value to the patient.: Yes Daily medical management of patient stability for increased activity during participation in an intensive rehabilitation regime.: Yes Daily analysis of laboratory values  and/or radiology reports with any subsequent need for medication adjustment of medical intervention for : Neurological problems  Dupree, Lemar Livings 06/21/2012, 10:38 AM

## 2012-06-20 NOTE — Progress Notes (Signed)
Occupational Therapy Session Note  Patient Details  Name: Raymond Rangel MRN: [id number] Date of Birth: Jul 17, 1931  Today's Date: 06/20/2012 Time: 1000-1100 Time Calculation (min): 60 min  Short Term Goals: Week 1:  OT Short Term Goal 1 (Week 1): Pt will roll in bed with mod assist to assist caregivers with clothing changes. OT Short Term Goal 1 - Progress (Week 1): Progressing toward goal OT Short Term Goal 2 (Week 1): Pt will sit on EOB with mod assist to engage in grooming tasks. OT Short Term Goal 2 - Progress (Week 1): Met OT Short Term Goal 3 (Week 1): Pt will don shirt with max assist. OT Short Term Goal 3 - Progress (Week 1): Met Week 2:  OT Short Term Goal 1 (Week 2): Pt will consistently transfer to the toilet using a squat pivot transfer with max assist x1. OT Short Term Goal 1 - Progress (Week 2): Met OT Short Term Goal 2 (Week 2): Pt will sit to stand from toilet and maintain standing with mod assist. OT Short Term Goal 2 - Progress (Week 2): Progressing toward goal OT Short Term Goal 3 (Week 2): Pt will demonstrate improved RUE motor control by bathing left arm with min assist. OT Short Term Goal 3 - Progress (Week 2): Met Week 3:  OT Short Term Goal 1 (Week 3): Pt will transfer to tub bench in tub with mod to max assist. OT Short Term Goal 2 (Week 3): Pt will transfer to the toilet with his daughter assisting him with mod to max assist. OT Short Term Goal 4 (Week 3): Pt will stand at toilet with mod assist so his daughter can assist him with clothing management.  Skilled Therapeutic Interventions/Progress Updates:      Pt seen for BADL retraining of bathing and dressing with a focus on attention, initiation, functional mobility and family education. Pt was much more alert today and engaged. He was using 2L of O2 this am.  Pt was much more involved in his self care and he was able to bathe with mod assist, don a shirt with min assist and don pants with max assist.  He was able to  stand at the sink with mod to max assist for LB care.  Pt continues to push feet forward to gain a sense of balance.  After ADLs completed, pt taken to ADL apartment kitchen to stand at sink with cabinets to block his feet from pushing forward.  Pt was able to pull himself into a standing position with supervision at the kitchen sink and only required min assist in standing.  It is recommended to start doing his self care at the kitchen sink at home initially, especially if only one person is present to assist.  Pt then worked on tub bench transfers using a squat pivot. He was able to get on and off the tub bench with mod assist x1 with right leg blocked by helpers legs. His daughter then completed the transfer with mod assist x1.  Pt demonstrated great improvement today.  Therapy Documentation Precautions:  Precautions Precautions: Fall Precaution Comments: increased tone on right side (extensor in leg and flexor in arm), decreased midline orientation, dementia Restrictions Weight Bearing Restrictions: No    Pain: Pain Assessment Pain Assessment: No/denies pain ADL:  See FIM for current functional status  Therapy/Group: Individual Therapy  SAGUIER,JULIA 06/20/2012, 11:54 AM

## 2012-06-20 NOTE — Progress Notes (Signed)
Social Work Patient ID: Raymond Rangel, male   DOB: 1931-04-26, 79 y.o.   MRN: [id number] Met with daughter-[name] to inform team conference team feels lost some days with UTI and fever yesterday. Have set new discharge date of 3/17.  Daughter feels much better about this and is encouraged by pt's improvement Today from yesterday.  Continue with family education and see how pt progresses hopefully can get back to level he was at prior to UTI. Work toward discharge Monday, daughter to be here daily.

## 2012-06-20 NOTE — Progress Notes (Signed)
Patient ID: Raymond Rangel, male   DOB: 1931-10-19, 79 y.o.   MRN: [id number]  Subjective/Complaints: 79 y.o. right-handed male history of TIA, diabetes mellitus and dementia. 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 Admitted 05/26/2012 with right-sided weakness and slurred speech. MRI shows acute left PCA territory infarcts without hemorrhage. MRA of the head with 50% mid basilar stenosis. Proximal occlusion left PCA. Echocardiogram with ejection fraction 60% and grade 1 diastolic dysfunction. Carotid Dopplers with no ICA stenosis. Patient did not receive TPA. Neurology consulted patient maintained on aspirin 81 mg daily as prior to admission and Plavix added to regimen. Subcutaneous Lovenox for DVT prophylaxis. Currently patient is maintained on a dysphagia 1 thin liquid diet after modified barium swallow study 05/28/2012.Monitoring of cognition with noted history of dementia placed on Seroquel.  Spiked temp to 102.9 no further temps, Low O2 sats but no SOB Family ed in progress , more alert today    Review of Systems  Review of Systems  Constitutional: Negative.   Respiratory: Positive for cough. Negative for hemoptysis, sputum production, shortness of breath and wheezing.        With meals  Gastrointestinal: Negative.   Neurological: Positive for focal weakness.  All other systems reviewed and are negative.    Objective: Vital Signs: Blood pressure 109/71, pulse 96, temperature 97.8 F (36.6 C), temperature source Oral, resp. rate 18, height 5\' 6"  (1.676 m), weight 61.78 kg (136 lb 3.2 oz), SpO2 97.00%. Dg Swallowing Func-speech Pathology  05/29/2012  [name], CCC-SLP     05/29/2012 11:55 AM   Recommend  to proceed with dysphagia 1 (puree) and thin liquids with full  assist with each meals to ensure safety.  Recommend puree mainly  due to severity of oral dysphagia.  Please note patient required  hand over hand assist for self feeding with majority of PO  trials  administered by treating SLP.  Only trace silent aspiration  occurred with nectar thick liquids and x1 with thin liquid but  patient was in optimal upright positioning.       Results for orders placed during the hospital encounter of 05/30/12 (from the past 72 hour(s))  GLUCOSE, CAPILLARY     Status: Abnormal   Collection Time    06/17/12 11:56 AM      Result Value Range   Glucose-Capillary 112 (*) 70 - 99 mg/dL  GLUCOSE, CAPILLARY     Status: Abnormal   Collection Time    06/17/12  1:19 PM      Result Value Range   Glucose-Capillary 101 (*) 70 - 99 mg/dL   Comment 1 Notify RN    BASIC METABOLIC PANEL     Status: Abnormal   Collection Time    06/17/12  3:24 PM      Result Value Range   Sodium 135  135 - 145 mEq/L   Potassium 4.0  3.5 - 5.1 mEq/L   Chloride 101  96 - 112 mEq/L   CO2 23  19 - 32 mEq/L   Glucose, Bld 101 (*) 70 - 99 mg/dL   BUN 17  6 - 23 mg/dL   Creatinine, Ser 4.09  0.50 - 1.35 mg/dL   Calcium 9.2  8.4 - 81.1 mg/dL   GFR calc non Af Amer 77 (*) >90 mL/min   GFR calc Af Amer 89 (*) >90 mL/min   Comment:            The eGFR has been calculated  using the CKD EPI equation.     This calculation has not been     validated in all clinical     situations.     eGFR's persistently     <90 mL/min signify     possible Chronic Kidney Disease.  CBC     Status: Abnormal   Collection Time    06/17/12  3:24 PM      Result Value Range   WBC 10.9 (*) 4.0 - 10.5 K/uL   RBC 4.68  4.22 - 5.81 MIL/uL   Hemoglobin 13.9  13.0 - 17.0 g/dL   HCT 04.5  40.9 - 81.1 %   MCV 86.3  78.0 - 100.0 fL   MCH 29.7  26.0 - 34.0 pg   MCHC 34.4  30.0 - 36.0 g/dL   RDW 91.4  78.2 - 95.6 %   Platelets 260  150 - 400 K/uL  GLUCOSE, CAPILLARY     Status: None   Collection Time    06/17/12  4:10 PM      Result Value Range   Glucose-Capillary 87  70 - 99 mg/dL  URINALYSIS, ROUTINE W REFLEX MICROSCOPIC     Status: Abnormal   Collection Time    06/17/12  7:10 PM      Result Value  Range   Color, Urine YELLOW  YELLOW   APPearance TURBID (*) CLEAR   Specific Gravity, Urine 1.023  1.005 - 1.030   pH 6.0  5.0 - 8.0   Glucose, UA NEGATIVE  NEGATIVE mg/dL   Hgb urine dipstick LARGE (*) NEGATIVE   Bilirubin Urine NEGATIVE  NEGATIVE   Ketones, ur NEGATIVE  NEGATIVE mg/dL   Protein, ur 30 (*) NEGATIVE mg/dL   Urobilinogen, UA 0.2  0.0 - 1.0 mg/dL   Nitrite POSITIVE (*) NEGATIVE   Leukocytes, UA LARGE (*) NEGATIVE  URINE MICROSCOPIC-ADD ON     Status: Abnormal   Collection Time    06/17/12  7:10 PM      Result Value Range   WBC, UA TOO NUMEROUS TO COUNT  <3 WBC/hpf   RBC / HPF TOO NUMEROUS TO COUNT  <3 RBC/hpf   Bacteria, UA MANY (*) RARE  URINE CULTURE     Status: None   Collection Time    06/17/12  7:11 PM      Result Value Range   Specimen Description URINE, RANDOM     Special Requests NONE     Culture  Setup Time 06/18/2012 04:09     Colony Count >=100,000 COLONIES/ML     Culture ESCHERICHIA COLI     Report Status 06/19/2012 FINAL     Organism ID, Bacteria ESCHERICHIA COLI    GLUCOSE, CAPILLARY     Status: Abnormal   Collection Time    06/17/12  9:37 PM      Result Value Range   Glucose-Capillary 103 (*) 70 - 99 mg/dL  GLUCOSE, CAPILLARY     Status: None   Collection Time    06/18/12  7:32 AM      Result Value Range   Glucose-Capillary 93  70 - 99 mg/dL   Comment 1 Notify RN    GLUCOSE, CAPILLARY     Status: Abnormal   Collection Time    06/18/12 11:30 AM      Result Value Range   Glucose-Capillary 105 (*) 70 - 99 mg/dL  GLUCOSE, CAPILLARY     Status: Abnormal   Collection Time    06/18/12  4:40 PM  Result Value Range   Glucose-Capillary 102 (*) 70 - 99 mg/dL   Comment 1 Notify RN    GLUCOSE, CAPILLARY     Status: Abnormal   Collection Time    06/18/12  9:06 PM      Result Value Range   Glucose-Capillary 124 (*) 70 - 99 mg/dL   Comment 1 Notify RN    GLUCOSE, CAPILLARY     Status: Abnormal   Collection Time    06/19/12  7:06 AM       Result Value Range   Glucose-Capillary 105 (*) 70 - 99 mg/dL   Comment 1 Notify RN    GLUCOSE, CAPILLARY     Status: Abnormal   Collection Time    06/19/12 11:23 AM      Result Value Range   Glucose-Capillary 100 (*) 70 - 99 mg/dL   Comment 1 Notify RN    GLUCOSE, CAPILLARY     Status: Abnormal   Collection Time    06/19/12  4:21 PM      Result Value Range   Glucose-Capillary 148 (*) 70 - 99 mg/dL   Comment 1 Notify RN    GLUCOSE, CAPILLARY     Status: Abnormal   Collection Time    06/19/12  8:28 PM      Result Value Range   Glucose-Capillary 142 (*) 70 - 99 mg/dL  CREATININE, SERUM     Status: Abnormal   Collection Time    06/20/12  6:13 AM      Result Value Range   Creatinine, Ser 0.97  0.50 - 1.35 mg/dL   GFR calc non Af Amer 77 (*) >90 mL/min   GFR calc Af Amer 89 (*) >90 mL/min   Comment:            The eGFR has been calculated     using the CKD EPI equation.     This calculation has not been     validated in all clinical     situations.     eGFR's persistently     <90 mL/min signify     possible Chronic Kidney Disease.  GLUCOSE, CAPILLARY     Status: Abnormal   Collection Time    06/20/12  7:19 AM      Result Value Range   Glucose-Capillary 105 (*) 70 - 99 mg/dL     HEENT: normal Cardio: RRR Resp: CTA B/L GI: BS positive and Distention Extremity:  Pulses positive and No Edema Skin:   Intact Neuro: Alert/Oriented, Confused but   attentive today. Doesn't realize he's in the hospital or what has happened to him. Flat, Cranial Nerve II-XII normal, Abnormal Sensory Reduced sensation on Right but also has poor attention to testing, Abnormal Motor 2-/5 in RUE and 3-/5 RLE, 4/5 LUE, 3-/5 LLE and Tone:  increased flexor tone RUE, increased extensor tone RLE Musc/Skel:  Other Old scar L antecubital fossa, non tender Gen:  NAD Cognitive: oriented to person and to hospital but not date Righ homonymous hemianopsia persistent  Assessment/Plan: 1. Functional deficits  secondary to Left PCA infarct which require 3+ hours per day of interdisciplinary therapy in a comprehensive inpatient rehab setting. Physiatrist is providing close team supervision and 24 hour management of active medical problems listed below. Physiatrist and rehab team continue to assess barriers to discharge/monitor patient progress toward functional and medical goals. FIM: FIM - Bathing Bathing Steps Patient Completed: Chest;Right Arm;Abdomen;Front perineal area;Right upper leg;Left upper leg Bathing: 1: Total-Patient completes 0-2 of  10 parts or less than 25%  FIM - Upper Body Dressing/Undressing Upper body dressing/undressing steps patient completed: Pull shirt over trunk;Thread/unthread left sleeve of pullover shirt/dress;Thread/unthread right sleeve of pullover shirt/dresss Upper body dressing/undressing: 0: Wears gown/pajamas-no public clothing FIM - Lower Body Dressing/Undressing Lower body dressing/undressing steps patient completed: Thread/unthread left pants leg;Pull pants up/down Lower body dressing/undressing: 1: Two helpers  FIM - Toileting Toileting: 1: Two helpers  FIM - Diplomatic Services operational officer Devices: Therapist, music Transfers: 1-Two helpers  FIM - Banker Devices: Arm rests Bed/Chair Transfer: 1: Two helpers  FIM - Locomotion: Wheelchair Distance: 15 Locomotion: Wheelchair: 1: Total Assistance/staff pushes wheelchair (Pt<25%) FIM - Locomotion: Ambulation Locomotion: Ambulation Assistive Devices: Other (comment) (hall rail on L) Ambulation/Gait Assistance: 1: +2 Total assist Locomotion: Ambulation: 0: Activity did not occur  Comprehension Comprehension Mode: Auditory Comprehension: 2-Understands basic 25 - 49% of the time/requires cueing 51 - 75% of the time  Expression Expression Mode: Verbal Expression: 2-Expresses basic 25 - 49% of the time/requires cueing 50 - 75% of the time. Uses single  words/gestures.  Social Interaction Social Interaction: 2-Interacts appropriately 25 - 49% of time - Needs frequent redirection.  Problem Solving Problem Solving Mode: Not assessed Problem Solving: 1-Solves basic less than 25% of the time - needs direction nearly all the time or does not effectively solve problems and may need a restraint for safety  Memory Memory Mode: Not assessed Memory: 1-Recognizes or recalls less than 25% of the time/requires cueing greater than 75% of the time Medical Problem List and Plan:  1. embolic left PCA infarct  2. DVT Prophylaxis/Anticoagulation: Subcutaneous Lovenox. Monitor platelet counts and any signs of bleeding Hx of Left femoral L popliteal,Left tibial subacute DVT diagnosed January 2013.lovenox 30 bid currently 3. Mood/dementia. Seroquel 25 mg twice a day, Ativan when necessary. Patient displayed baseline cognitive deficits per family. Will expect wax and wane.Trial Aricept- insomnia related to Aricept improved after a couple days will monitor 4. Neuropsych: This patient is not capable of making decisions on his/her own behalf.  5. Hyperlipidemia. Zocor  6. Diabetes mellitus with peripheral neuropathy. Hemoglobin A1c 6.7. Patient on diet control prior to admission. Check CBGs a.c. and at bedtime- fair control generally. Cover spikes with SSI for now. 7. Dysphagia. Dysphagia 1 thin liquids. Speech therapy followup. Monitor for any signs of aspiration.  8. UTI recurrent now on nitrofurantoin 9.  Constipation nsg to give sorbitol 10.  Spasticity tone improved on Zanaflex and  baclofen,  sedated,now held, tone looks ok but if it increases again will need to resume at a lower dose (ie BID)    LOS (Days) 21 A FACE TO FACE EVALUATION WAS PERFORMED  KIRSTEINS,ANDREW E 06/20/2012, 10:01 AM

## 2012-06-20 NOTE — Progress Notes (Signed)
Physical Therapy Session Note  Patient Details  Name: Raymond Rangel MRN: [id number] Date of Birth: [birth year]  Today's Date: 06/20/2012 Time:0920-1002 and 1191-4782 Time Calculation (min): 42 and 49 min  Short Term Goals: Week 3: LTGs    Skilled Therapeutic Interventions/Progress Updates:  Pt much more alert than yesterday, conversational.  1st visit:   neuromuscular re-education via manual cues, VCs, : -Sit> stand at rail with max assist to block RLE and shift wt forward, pulling up with L hand on rail. -Gait x 30' focusing on upright stance, forward gaze, wt shifting to L in order to [place] RLE in order to advance it. RLE hypertonus was apparent, but manageable during gait, without RAFO.  Daughter [name] arrived for family ed.  Discussed her request for home eval regarding size of hospital bed, w/c accessibility; therapist will follow up with team at conference.  Returned to room to practice w/c>< hospital bed- transfers.  Attempted stand pivot, daughter performing it, but pt's RLE hypertonus/rigidity made the transfer unsafe to continue.   Therapist attempted stand/pivot, squat/pivot and SB transfers, but all were unsafe due to pt's rigidity, visual and perceptual problems.     Therapist left pt in w/c in room with daughter; info passed on to OT.  2nd visit:  Continued family ed with daughter [name].  Pt wearing RLE AFO.  Bed> w/c with HOB elevated; attempted stand/pivot transfer x 2.  Pt's RLE rigidity again limiting safety.  Pt was compliant with SB transfers with mod assist.  Daughter return demonstrated transfer, with moderate cues for sequence, safest techniques and commands.  W/c> simulated car transfer at sedan height: stand pivot with pt pulling up on frame with L hand, mod assist.  Car> w/c stand/pivot with mod/max assist due to pt's difficulty pivoting RLE.  Daughter return demonstrated same transfer with moderate cues as above.  Therapist instructed,  and family return  demonstrated folding w/c, managing legrests and cushion, and lifting w/c to place in car.    Therapy Documentation Precautions:  Precautions Precautions: Fall Precaution Comments: increased tone on right side (extensor in leg and flexor in arm), decreased midline orientation, dementia Restrictions Weight Bearing Restrictions: No Therapy Vitals Temp: 97.4 F (36.3 C) Temp src: Oral Pulse Rate: 76 Resp: 18 BP: 111/85 mmHg Patient Position, if appropriate: Lying Oxygen Therapy SpO2: 100 % O2 Device: None (Room air) Pain:     Locomotion :    Trunk/Postural Assessment :    Balance:   Exercises:   Other Treatments: Treatments Neuromuscular Facilitation: Right;Lower Extremity;Activity to increase motor control;Forced use;Activity to increase anterior-posterior weight shifting  See FIM for current functional status  Therapy/Group: Individual Therapy  COOK,CAROLINE 06/20/2012, 3:37 PM

## 2012-06-20 NOTE — Progress Notes (Signed)
Speech Language Pathology Daily Session Note  Patient Details  Name: Raymond Rangel MRN: [id number] Date of Birth: Nov 08, 1931  Today's Date: 06/20/2012 Time: 4098-1191 Time Calculation (min): 45 min  Short Term Goals: Week 3: SLP Short Term Goal 1 (Week 3): Patient will follow 1 step directions during functional, familiar tasks with mod assist verbal and visual cues. SLP Short Term Goal 2 (Week 3): Patient will demonstrate basic problem solving during functional, familiar tasks with mod assist verbal and visual cues. SLP Short Term Goal 3 (Week 3): Patient will consume Dys.2 textures and thin liquid with min assist verbal, visual and tactile cues with no overt s/s of aspiration.  SLP Short Term Goal 4 (Week 3): Family will demonstrate 2 cuing strategies to utilize with patient durig completion of basic self care tasks.  Skilled Therapeutic Interventions: Skilled treatment session focused on addressing dysphagia and cognitive goals during a self-care task. SLP and RN assisted patient with bed to chair transfer.  After set-up patient required mod assist tactile cues and faded to min assist verbal/directional cues to attend to, initiate and problem solve self-feeding.  Patient exhibited no overt s/s of aspiration with consumption of Dys.2 textures and thin liquids.  Patient with slight functional imporvement compared to yesteray's session.    FIM:  Comprehension Comprehension Mode: Auditory Comprehension: 2-Understands basic 25 - 49% of the time/requires cueing 51 - 75% of the time Expression Expression Mode: Verbal Expression: 2-Expresses basic 25 - 49% of the time/requires cueing 50 - 75% of the time. Uses single words/gestures. Social Interaction Social Interaction: 2-Interacts appropriately 25 - 49% of time - Needs frequent redirection. Problem Solving Problem Solving: 1-Solves basic less than 25% of the time - needs direction nearly all the time or does not effectively solve problems and  may need a restraint for safety Memory Memory: 1-Recognizes or recalls less than 25% of the time/requires cueing greater than 75% of the time FIM - Eating Eating Activity: 4: Helper occasionally scoops food on utensil;4: Help with managing cup/glass  Pain Pain Assessment Pain Assessment: No/denies pain  Therapy/Group: Individual Therapy  Charlane Ferretti., CCC-SLP [phone]  [name],[name] 06/20/2012, 4:19 PM

## 2012-06-21 ENCOUNTER — Inpatient Hospital Stay (HOSPITAL_COMMUNITY): Payer: Medicare Other | Admitting: Occupational Therapy

## 2012-06-21 ENCOUNTER — Inpatient Hospital Stay (HOSPITAL_COMMUNITY): Payer: Medicare Other

## 2012-06-21 ENCOUNTER — Inpatient Hospital Stay (HOSPITAL_COMMUNITY): Payer: Medicare Other | Admitting: Speech Pathology

## 2012-06-21 DIAGNOSIS — R209 Unspecified disturbances of skin sensation: Secondary | ICD-10-CM

## 2012-06-21 DIAGNOSIS — G8110 Spastic hemiplegia affecting unspecified side: Secondary | ICD-10-CM

## 2012-06-21 DIAGNOSIS — I69998 Other sequelae following unspecified cerebrovascular disease: Secondary | ICD-10-CM

## 2012-06-21 DIAGNOSIS — I6330 Cerebral infarction due to thrombosis of unspecified cerebral artery: Secondary | ICD-10-CM

## 2012-06-21 LAB — GLUCOSE, CAPILLARY: Glucose-Capillary: 102 mg/dL — ABNORMAL HIGH (ref 70–99)

## 2012-06-21 MED ORDER — LORAZEPAM 0.5 MG PO TABS
0.2500 mg | ORAL_TABLET | Freq: Once | ORAL | Status: AC
  Administered 2012-06-21: 0.25 mg via ORAL
  Filled 2012-06-21: qty 1

## 2012-06-21 NOTE — Progress Notes (Addendum)
Physical Therapy Weekly Progress Note  Patient Details  Name: Raymond Rangel MRN: [id number] Date of Birth: [birth year]  Today's Date: 06/21/2012 Time:0810-0900 and  1110-1205 Time Calculation (min): 50 and 55 min  Patient has met 1 of 5 long term goals.  Short term goals not set due to estimated length of stay.  Pt had a UTI in the last week, with fever, increased confusion, somnolence; functional mobility declined during that time, but has improved in the last 2 days.  Some family ed has been completed, but family needs more practice with transfers and up/down steps into house.  D/C now set for 06/25/12.  Patient continues to demonstrate the following deficits: muscle timing and sequencing, activity tolerance, balance, visual/perceptual, midline orientation,  and therefore will continue to benefit from skilled PT intervention to enhance overall performance with activity tolerance, balance, postural control, ability to compensate for deficits, functional use of  right upper extremity and right lower extremity and awareness.  See Patient's Care Plan for progression toward long term goals.  Patient progressing toward long term goals..; stairs goal has been downgraded to total assist due to pt's inconsistent performance, dementai, consfusion.Continue plan of care.  Skilled Therapeutic Interventions/Progress Updates:   1st session: NT reported that pt was restless and attempted to get out of bed last night.  Pt eating at nurses's station with assistance.  Therapist donned pt's bil shoes and R AFO.  neuromuscular re-education via forced use, manual cues, visual cues for: -Gait in hall using L rail, mod assist x 30', with HHA x 10' max assist, focusing on R knee flexion in order to advance RLE, and wt shifting. -R UE motor control and cognitive/perceptual task in sitting with feet supported, retrieving items from RE visual field, and crossing midline to place on L side of body, with  supervision -up/down 5 steps with 2 rails, mod/max assist for all but descending (2) 7" steps, +2 assist due to difficulty wt shifting to L in order to lower RLE -transfer training in sitting, scooting L and R with min assist, focusing on wt shift forward to facilitate elevation of hips -gait with grocery cart, min> max assist, focusing on midline orientation, R knee flexion to advance RLE  Therapist left pt at nurse's station as requested.   2nd session:  neuromuscular re-education via manual cues, auditory cues (music) for: -wt shifting in standing, LUE support, mod assist  Pt had bowel accident.  This is 4th of the day; notified RN.  Returned to room, w/c to bed to L stand pivot with mod assist.  Rolling for hygiene and brief change, with min assist, cueing.  Treatment focused on repeated bed/mat>< w/c transfers stand pivot and SB with mod to max assist.  Pt has more difficulty transferring to the R due to perceptual deficits. He inconsistently initiated lift/scoot on the SB once part way across the board, if going to the L and therapist in L visual field.  Family needs more training in transfers since pt is so inconsistent and resists movement at times. .   Therapy Documentation Precautions:  Precautions Precautions: Fall Precaution Comments: increased tone on right side (extensor in leg and flexor in arm), decreased midline orientation, dementia Restrictions Weight Bearing Restrictions: No   Other Treatments: Treatments Neuromuscular Facilitation: Right;Upper Extremity;Lower Extremity;Forced use;Activity to increase coordination;Activity to increase motor control;Activity to increase timing and sequencing;Activity to increase lateral weight shifting;Activity to increase anterior-posterior weight shifting  See FIM for current functional status  Therapy/Group: Individual Therapy  COOK,CAROLINE  06/21/2012, 12:27 PM

## 2012-06-21 NOTE — Progress Notes (Signed)
Occupational Therapy Session Note  Patient Details  Name: Raymond Rangel MRN: [id number] Date of Birth: 1931/04/23  Today's Date: 06/21/2012 Time: 1000-1100 Time Calculation (min): 60 min  Short Term Goals: Week 1:  OT Short Term Goal 1 (Week 1): Pt will roll in bed with mod assist to assist caregivers with clothing changes. OT Short Term Goal 1 - Progress (Week 1): Progressing toward goal OT Short Term Goal 2 (Week 1): Pt will sit on EOB with mod assist to engage in grooming tasks. OT Short Term Goal 2 - Progress (Week 1): Met OT Short Term Goal 3 (Week 1): Pt will don shirt with max assist. OT Short Term Goal 3 - Progress (Week 1): Met Week 2:  OT Short Term Goal 1 (Week 2): Pt will consistently transfer to the toilet using a squat pivot transfer with max assist x1. OT Short Term Goal 1 - Progress (Week 2): Met OT Short Term Goal 2 (Week 2): Pt will sit to stand from toilet and maintain standing with mod assist. OT Short Term Goal 2 - Progress (Week 2): Progressing toward goal OT Short Term Goal 3 (Week 2): Pt will demonstrate improved RUE motor control by bathing left arm with min assist. OT Short Term Goal 3 - Progress (Week 2): Met Week 3:  OT Short Term Goal 1 (Week 3): Pt will transfer to tub bench in tub with mod to max assist. OT Short Term Goal 2 (Week 3): Pt will transfer to the toilet with his daughter assisting him with mod to max assist. OT Short Term Goal 4 (Week 3): Pt will stand at toilet with mod assist so his daughter can assist him with clothing management.     Skilled Therapeutic Interventions/Progress Updates:      Pt seen for BADL retraining of toileting, bathing, and dressing with a focus on sit to stand, control of BLE, and upright posture.  Pt's family was not present today and pt was more disoriented today. He transferred to the toilet with max assist x1, then with mod assist x 2 to transfer back to w/c.  He stood to sink with mod assist with RLE stabilized into  extension.  He stood approximately 6 x for clothing changes, bathing, then again after a bowel accident.  50% of the time he was able to stand completely upright with min assist and the other 50% he was leaning severely to the right while pushing his LLE out to the side needing max assist to stay upright. Pt seemed to be in good spirits telling me that he would like to stay in touch, but was not truly aware that he was just in a therapy session.  Therapy Documentation Precautions:  Precautions Precautions: Fall Precaution Comments: increased tone on right side (extensor in leg and flexor in arm), decreased midline orientation, dementia Restrictions Weight Bearing Restrictions: No     Pain: Pain Assessment Pain Assessment: No/denies pain ADL:  See FIM for current functional status  Therapy/Group: Individual Therapy  SAGUIER,JULIA 06/21/2012, 11:58 AM

## 2012-06-21 NOTE — Progress Notes (Signed)
Patient ID: Raymond Rangel, male   DOB: 05-Dec-1931, 79 y.o.   MRN: [id number]  Subjective/Complaints: 79 y.o. right-handed male history of TIA, diabetes mellitus and dementia. 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 Admitted 05/26/2012 with right-sided weakness and slurred speech. MRI shows acute left PCA territory infarcts without hemorrhage. MRA of the head with 50% mid basilar stenosis. Proximal occlusion left PCA. Echocardiogram with ejection fraction 60% and grade 1 diastolic dysfunction. Carotid Dopplers with no ICA stenosis. Patient did not receive TPA. Neurology consulted patient maintained on aspirin 81 mg daily as prior to admission and Plavix added to regimen. Subcutaneous Lovenox for DVT prophylaxis. Currently patient is maintained on a dysphagia 1 thin liquid diet after modified barium swallow study 05/28/2012.Monitoring of cognition with noted history of dementia placed on Seroquel.  More alert, remains afebrile, working with physical therapy. Still has tone with the plantar flexors and toe flexors in the right lower extremity as well as the elbow flexors finger and wrist flexors in the right upper remedy.  Review of Systems  Review of Systems  Constitutional: Negative.   Respiratory: Positive for cough. Negative for hemoptysis, sputum production, shortness of breath and wheezing.        With meals  Gastrointestinal: Negative.   Neurological: Positive for focal weakness.  All other systems reviewed and are negative.    Objective: Vital Signs: Blood pressure 129/80, pulse 103, temperature 97.3 F (36.3 C), temperature source Oral, resp. rate 17, height 5\' 6"  (1.676 m), weight 62 kg (136 lb 11 oz), SpO2 99.00%. Dg Swallowing Func-speech Pathology  05/29/2012  [name], CCC-SLP     05/29/2012 11:55 AM   Recommend  to proceed with dysphagia 1 (puree) and thin liquids with full  assist with each meals to ensure safety.  Recommend puree mainly  due to  severity of oral dysphagia.  Please note patient required  hand over hand assist for self feeding with majority of PO trials  administered by treating SLP.  Only trace silent aspiration  occurred with nectar thick liquids and x1 with thin liquid but  patient was in optimal upright positioning.       Results for orders placed during the hospital encounter of 05/30/12 (from the past 72 hour(s))  GLUCOSE, CAPILLARY     Status: Abnormal   Collection Time    06/18/12 11:30 AM      Result Value Range   Glucose-Capillary 105 (*) 70 - 99 mg/dL  GLUCOSE, CAPILLARY     Status: Abnormal   Collection Time    06/18/12  4:40 PM      Result Value Range   Glucose-Capillary 102 (*) 70 - 99 mg/dL   Comment 1 Notify RN    GLUCOSE, CAPILLARY     Status: Abnormal   Collection Time    06/18/12  9:06 PM      Result Value Range   Glucose-Capillary 124 (*) 70 - 99 mg/dL   Comment 1 Notify RN    GLUCOSE, CAPILLARY     Status: Abnormal   Collection Time    06/19/12  7:06 AM      Result Value Range   Glucose-Capillary 105 (*) 70 - 99 mg/dL   Comment 1 Notify RN    GLUCOSE, CAPILLARY     Status: Abnormal   Collection Time    06/19/12 11:23 AM      Result Value Range   Glucose-Capillary 100 (*) 70 - 99 mg/dL  Comment 1 Notify RN    GLUCOSE, CAPILLARY     Status: Abnormal   Collection Time    06/19/12  4:21 PM      Result Value Range   Glucose-Capillary 148 (*) 70 - 99 mg/dL   Comment 1 Notify RN    GLUCOSE, CAPILLARY     Status: Abnormal   Collection Time    06/19/12  8:28 PM      Result Value Range   Glucose-Capillary 142 (*) 70 - 99 mg/dL  CREATININE, SERUM     Status: Abnormal   Collection Time    06/20/12  6:13 AM      Result Value Range   Creatinine, Ser 0.97  0.50 - 1.35 mg/dL   GFR calc non Af Amer 77 (*) >90 mL/min   GFR calc Af Amer 89 (*) >90 mL/min   Comment:            The eGFR has been calculated     using the CKD EPI equation.     This calculation has not been     validated in  all clinical     situations.     eGFR's persistently     <90 mL/min signify     possible Chronic Kidney Disease.  GLUCOSE, CAPILLARY     Status: Abnormal   Collection Time    06/20/12  7:19 AM      Result Value Range   Glucose-Capillary 105 (*) 70 - 99 mg/dL  GLUCOSE, CAPILLARY     Status: None   Collection Time    06/20/12 11:28 AM      Result Value Range   Glucose-Capillary 94  70 - 99 mg/dL   Comment 1 Notify RN    CBC WITH DIFFERENTIAL     Status: Abnormal   Collection Time    06/20/12 11:52 AM      Result Value Range   WBC 14.5 (*) 4.0 - 10.5 K/uL   Comment: WHITE COUNT CONFIRMED ON SMEAR   RBC 4.90  4.22 - 5.81 MIL/uL   Hemoglobin 14.5  13.0 - 17.0 g/dL   HCT 16.1  09.6 - 04.5 %   MCV 86.1  78.0 - 100.0 fL   MCH 29.6  26.0 - 34.0 pg   MCHC 34.4  30.0 - 36.0 g/dL   RDW 40.9  81.1 - 91.4 %   Platelets 296  150 - 400 K/uL   Comment: PLATELET COUNT CONFIRMED BY SMEAR   Neutrophils Relative 76  43 - 77 %   Lymphocytes Relative 14  12 - 46 %   Monocytes Relative 7  3 - 12 %   Eosinophils Relative 3  0 - 5 %   Basophils Relative 0  0 - 1 %   Neutro Abs 11.1 (*) 1.7 - 7.7 K/uL   Lymphs Abs 2.0  0.7 - 4.0 K/uL   Monocytes Absolute 1.0  0.1 - 1.0 K/uL   Eosinophils Absolute 0.4  0.0 - 0.7 K/uL   Basophils Absolute 0.0  0.0 - 0.1 K/uL  GLUCOSE, CAPILLARY     Status: None   Collection Time    06/20/12  4:50 PM      Result Value Range   Glucose-Capillary 99  70 - 99 mg/dL   Comment 1 Notify RN    GLUCOSE, CAPILLARY     Status: Abnormal   Collection Time    06/20/12  8:36 PM      Result Value Range  Glucose-Capillary 100 (*) 70 - 99 mg/dL     HEENT: normal Cardio: RRR Resp: CTA B/L GI: BS positive and Distention Extremity:  Pulses positive and No Edema Skin:   Intact Neuro: Alert/Oriented, Confused but   attentive today. Doesn't realize he's in the hospital or what has happened to him. Flat, Cranial Nerve II-XII normal, Abnormal Sensory Reduced sensation on Right  but also has poor attention to testing, Abnormal Motor 2-/5 in RUE and 3-/5 RLE, 4/5 LUE, 3-/5 LLE and Tone:  increased flexor tone RUE, increased extensor tone RLE Musc/Skel:  Other Old scar L antecubital fossa, non tender Gen:  NAD Cognitive: oriented to person and to hospital but not date Righ homonymous hemianopsia persistent  Assessment/Plan: 1. Functional deficits secondary to Left PCA infarct which require 3+ hours per day of interdisciplinary therapy in a comprehensive inpatient rehab setting. Physiatrist is providing close team supervision and 24 hour management of active medical problems listed below. Physiatrist and rehab team continue to assess barriers to discharge/monitor patient progress toward functional and medical goals. FIM: FIM - Bathing Bathing Steps Patient Completed: Chest;Right Arm;Abdomen;Front perineal area;Right upper leg;Left upper leg Bathing: 3: Mod-Patient completes 5-7 78f 10 parts or 50-74%  FIM - Upper Body Dressing/Undressing Upper body dressing/undressing steps patient completed: Thread/unthread left sleeve of pullover shirt/dress;Put head through opening of pull over shirt/dress;Pull shirt over trunk Upper body dressing/undressing: 4: Min-Patient completed 75 plus % of tasks FIM - Lower Body Dressing/Undressing Lower body dressing/undressing steps patient completed: Thread/unthread left pants leg Lower body dressing/undressing: 2: Max-Patient completed 25-49% of tasks  FIM - Toileting Toileting: 1: Two helpers  FIM - Diplomatic Services operational officer Devices: Therapist, music Transfers: 1-Two helpers  FIM - Architectural technologist Transfer: 3: Bed > Chair or W/C: Mod A (lift or lower assist);3: Chair or W/C > Bed: Mod A (lift or lower assist);3: Sit > Supine: Mod A (lifting assist/Pt. 50-74%/lift 2 legs)  FIM - Locomotion: Wheelchair Distance: 15 Locomotion: Wheelchair: 1: Total  Assistance/staff pushes wheelchair (Pt<25%) FIM - Locomotion: Ambulation Locomotion: Ambulation Assistive Devices: Other (comment) (L rail in hallway) Ambulation/Gait Assistance: 3: Mod assist Locomotion: Ambulation: 1: Travels less than 50 ft with moderate assistance (Pt: 50 - 74%) (30)  Comprehension Comprehension Mode: Auditory Comprehension: 1-Understands basic less than 25% of the time/requires cueing 75% of the time  Expression Expression Mode: Verbal Expression: 2-Expresses basic 25 - 49% of the time/requires cueing 50 - 75% of the time. Uses single words/gestures.  Social Interaction Social Interaction: 2-Interacts appropriately 25 - 49% of time - Needs frequent redirection.  Problem Solving Problem Solving Mode: Not assessed Problem Solving: 1-Solves basic less than 25% of the time - needs direction nearly all the time or does not effectively solve problems and may need a restraint for safety  Memory Memory Mode: Not assessed Memory: 1-Recognizes or recalls less than 25% of the time/requires cueing greater than 75% of the time Medical Problem List and Plan:  1. embolic left PCA infarct  2. DVT Prophylaxis/Anticoagulation: Subcutaneous Lovenox. Monitor platelet counts and any signs of bleeding Hx of Left femoral L popliteal,Left tibial subacute DVT diagnosed January 2013.lovenox 30 bid currently 3. Mood/dementia. Seroquel 25 mg twice a day, Ativan when necessary. Patient displayed baseline cognitive deficits per family. Will expect wax and wane.Trial Aricept- insomnia related to Aricept improved after a couple days will monitor 4. Neuropsych: This patient is not capable of making decisions on his/her own behalf.  5. Hyperlipidemia. Zocor  6. Diabetes mellitus with peripheral neuropathy. Hemoglobin A1c 6.7. Patient on diet control prior to admission. Check CBGs a.c. and at bedtime- fair control generally. Cover spikes with SSI for now. 7. Dysphagia. Dysphagia 1 thin liquids.  Speech therapy followup. Monitor for any signs of aspiration.  8. UTI recurrent now on nitrofurantoin 9.  Constipation nsg to give sorbitol 10.  Spasticity tone improved on Zanaflex and  baclofen,  sedated,now held, tone looks ok but if it increases again will need to resume at a lower dose (ie BID)    LOS (Days) 22 A FACE TO FACE EVALUATION WAS PERFORMED  KIRSTEINS,ANDREW E 06/21/2012, 8:08 AM

## 2012-06-21 NOTE — Plan of Care (Signed)
Problem: RH BLADDER ELIMINATION Goal: RH STG MANAGE BLADDER WITH ASSISTANCE STG Manage Bladder With max assist timed toileting  Outcome: Not Progressing Urinary incontinence. Uses condom catheter at night.  Problem: RH SAFETY Goal: RH STG ADHERE TO SAFETY PRECAUTIONS W/ASSISTANCE/DEVICE STG Adhere to Safety Precautions With min assist  Outcome: Not Progressing Patient with episodes of confusion and trying to get out of the bed. Bed alarm, quick release belt in place. Monitored frequently. Goal: RH STG DECREASED RISK OF FALL WITH ASSISTANCE STG Decreased Risk of Fall With min assist  Outcome: Not Progressing Patient with episodes of confusion. Bed alarm and quick release belt  in place. Monitored frequently.

## 2012-06-21 NOTE — Progress Notes (Addendum)
Speech Language Pathology Daily Session Note & Weekly Progress Note  Patient Details  Name: Raymond Rangel MRN: [id number] Date of Birth: September 12, 1931  Today's Date: 06/21/2012 Time: 1305-1330 Time Calculation (min): 25 min  Short Term Goals: Week 3: SLP Short Term Goal 1 (Week 3): Patient will follow 1 step directions during functional, familiar tasks with mod assist verbal and visual cues. SLP Short Term Goal 2 (Week 3): Patient will demonstrate basic problem solving during functional, familiar tasks with mod assist verbal and visual cues. SLP Short Term Goal 3 (Week 3): Patient will consume Dys.2 textures and thin liquid with min assist verbal, visual and tactile cues with no overt s/s of aspiration.  SLP Short Term Goal 4 (Week 3): Family will demonstrate 2 cuing strategies to utilize with patient durig completion of basic self care tasks.  Skilled Therapeutic Interventions: Skilled treatment session focused on addressing dysphagia education with patient and daughter.  Upon SLP entering room daughter assisting patient with lunch and was demonstrating appropriate use of cuing strategies; due to fatigue patient refusing textures and daughter was assisting with consumption of Ensure.  SLP provided with handout specifying recommendations and restrictions for Dys.2 textures. Daughter was eager for information and verbalized understanding.     FIM:  Comprehension Comprehension Mode: Auditory Comprehension: 1-Understands basic less than 25% of the time/requires cueing 75% of the time Expression Expression Mode: Verbal Expression: 1-Expresses basis less than 25% of the time/requires cueing greater than 75% of the time. Social Interaction Social Interaction: 2-Interacts appropriately 25 - 49% of time - Needs frequent redirection. Problem Solving Problem Solving: 1-Solves basic less than 25% of the time - needs direction nearly all the time or does not effectively solve problems and may need a  restraint for safety Memory Memory: 1-Recognizes or recalls less than 25% of the time/requires cueing greater than 75% of the time FIM - Eating Eating Activity: 2: Hand over hand assist  Pain Pain Assessment Pain Assessment: No/denies pain  Therapy/Group: Individual Therapy   Speech Language Pathology Weekly Progress Note  Patient Details  Name: Raymond Rangel MRN: [id number] Date of Birth: [birth year]  Today's Date: 06/21/2012  Short Term Goals: Week 3: SLP Short Term Goal 1 (Week 3): Patient will follow 1 step directions during functional, familiar tasks with mod assist verbal and visual cues. SLP Short Term Goal 1 - Progress (Week 3): Progressing toward goal SLP Short Term Goal 2 (Week 3): Patient will demonstrate basic problem solving during functional, familiar tasks with mod assist verbal and visual cues. SLP Short Term Goal 2 - Progress (Week 3): Progressing toward goal SLP Short Term Goal 3 (Week 3): Patient will consume Dys.2 textures and thin liquid with min assist verbal, visual and tactile cues with no overt s/s of aspiration.  SLP Short Term Goal 3 - Progress (Week 3): Progressing toward goal SLP Short Term Goal 4 (Week 3): Family will demonstrate 2 cuing strategies to utilize with patient durig completion of basic self care tasks. SLP Short Term Goal 4 - Progress (Week 3): Progressing toward goal Week 4: SLP Short Term Goal 1 (Week 4): Patient will follow 1 step directions during functional, familiar tasks with mod assist verbal and visual cues. SLP Short Term Goal 2 (Week 4): Patient will demonstrate basic problem solving during functional, familiar tasks with mod assist verbal and visual cues. SLP Short Term Goal 3 (Week 4): Patient will demonstrate basic problem solving during functional, familiar tasks with mod assist verbal and visual cues. SLP Short Term  Goal 4 (Week 4): Family will demonstrate 2 cuing strategies to utilize with patient durig completion of basic self  care tasks.  Weekly Progress Updates: Patient is still progressing toward goals from last week due to UTI on top of fluctuating cognition patient is not consistently performing or meeting goals.  Given length of stay was extended due to medical issues all goals are being continued into next week.  As a result, SLP has been focusing on family education across several days with different hierarchical cuing techniques. Education has been completed with daughter; however, wife and other caregivers need education prior to discharge home 06/25/12.     SLP Intensity: Minumum of 1-2 x/day, 30 to 90 minutes SLP Frequency: 5 out of 7 days SLP Duration/Estimated Length of Stay: 06/25/12 SLP Treatment/Interventions: Cognitive remediation/compensation;Cueing hierarchy;Dysphagia/aspiration precaution training;Environmental controls;Functional tasks;Internal/external aids;Patient/family education;Speech/Language facilitation;Therapeutic Activities  Charlane Ferretti., CCC-SLP [phone]  [name],[name] 06/21/2012, 3:52 PM

## 2012-06-21 NOTE — Progress Notes (Signed)
Occupational Therapy Weekly Progress Note  Patient Details  Name: Raymond Rangel MRN: [id number] Date of Birth: 08/27/1931  Today's Date: 06/22/2012 Time: 0900-1000 Time Calculation (min): 60 min    Patient has met 1 of 3 short term goals.  Pt is progressing, but due to UTI this week his functional level has fluctuated.  He has been inconsistent with his skill levels this week. He did meet tub transfer goal, but is still working on his toileting and standing balance goals.  Patient continues to demonstrate the following deficits:right neglect, severe perceptual skills of midline orientation, increased extensor tone in all extremities and trunk, poor trunk control and balance, and ataxia, memory deficits, poor recall of new information and therefore will continue to benefit from skilled OT intervention to enhance overall performance with Reduce care partner burden.  Patient progressing toward long term goals..  Continue plan of care.  OT Short Term Goals Week 1:  OT Short Term Goal 1 (Week 1): Pt will roll in bed with mod assist to assist caregivers with clothing changes. OT Short Term Goal 1 - Progress (Week 1): Progressing toward goal OT Short Term Goal 2 (Week 1): Pt will sit on EOB with mod assist to engage in grooming tasks. OT Short Term Goal 2 - Progress (Week 1): Met OT Short Term Goal 3 (Week 1): Pt will don shirt with max assist. OT Short Term Goal 3 - Progress (Week 1): Met Week 2:  OT Short Term Goal 1 (Week 2): Pt will consistently transfer to the toilet using a squat pivot transfer with max assist x1. OT Short Term Goal 1 - Progress (Week 2): Met OT Short Term Goal 2 (Week 2): Pt will sit to stand from toilet and maintain standing with mod assist. OT Short Term Goal 2 - Progress (Week 2): Progressing toward goal OT Short Term Goal 3 (Week 2): Pt will demonstrate improved RUE motor control by bathing left arm with min assist. OT Short Term Goal 3 - Progress (Week 2): Met Week 3:   OT Short Term Goal 1 (Week 3): Pt will transfer to tub bench in tub with mod to max assist. OT Short Term Goal 1 - Progress (Week 3): Met OT Short Term Goal 2 (Week 3): Pt will transfer to the toilet with his daughter assisting him with mod to max assist. OT Short Term Goal 2 - Progress (Week 3): Progressing toward goal OT Short Term Goal 4 (Week 3): Pt will stand at toilet with mod assist so his daughter can assist him with clothing management. OT Short Term Goal 4 - Progress (Week 3): Progressing toward goal Week 4:  OT Short Term Goal 1 (Week 4): Pt will transfer to the [facility] with his daughter assisting him with mod to max assist. OT Short Term Goal 2 (Week 4): Pt will stand up holding onto bed rail with mod assist so that his daughter can assist him with clothing management.  Skilled Therapeutic Interventions/Progress Updates:  1:1 Pt seen for BADL retraining with family education with his daughter to include a shower in tub in ADL apartment and dressing LB by standing at kitchen sink.  Pt required 2 person assist on and off tub bench, but he was still able to put effort in to only require mod to max to lift. The 2nd person guided his hips on and off bench. Pt bathed in shower with mod assist.  To don LB clothing pt taken to kitchen sink to stand. He had on  his nonslip gripper socks. He stood with only supervision and min assist to maintain balance, but when he sat down his right foot was scraped by base of kitchen cabinet. He had a small scrap and nursing place a pad on it.  Discussed with daughter set up for home and plans for his home evaluation later today.  Pt left in room with daughter so she could continue assisting him with his grooming.  Continue OT 1-2 x a day for 5-7 days with Balance/vestibular training;Cognitive remediation/compensation;Discharge planning;DME/adaptive equipment instruction;Functional mobility training;Neuromuscular re-education;Patient/family education;Self Care/advanced  ADL retraining;Therapeutic Activities;Therapeutic Exercise;UE/LE Strength taining/ROM;UE/LE Coordination activities;Visual/perceptual remediation/compensation to maximize his level of independence.  Therapy Documentation Precautions:  Precautions Precautions: Fall Precaution Comments: increased tone on right side (extensor in leg and flexor in arm), decreased midline orientation, dementia Restrictions Weight Bearing Restrictions: No   Pain: Pain Assessment Pain Assessment: No/denies pain Pain Score: 0-No pain ADL:  See FIM for current functional status  Therapy/Group: Individual Therapy  SAGUIER,JULIA 06/22/2012, 10:13 AM

## 2012-06-22 ENCOUNTER — Inpatient Hospital Stay (HOSPITAL_COMMUNITY): Payer: Medicare Other | Admitting: Occupational Therapy

## 2012-06-22 ENCOUNTER — Inpatient Hospital Stay (HOSPITAL_COMMUNITY): Payer: Medicare Other

## 2012-06-22 ENCOUNTER — Inpatient Hospital Stay (HOSPITAL_COMMUNITY): Payer: Medicare Other | Admitting: Speech Pathology

## 2012-06-22 LAB — GLUCOSE, CAPILLARY: Glucose-Capillary: 90 mg/dL (ref 70–99)

## 2012-06-22 NOTE — Progress Notes (Signed)
Orthopedic Tech Progress Note Patient Details:  Raymond Rangel July 08, 1931 [id number] Brace order completed by [facility]. Patient ID: Raymond Rangel, male   DOB: 1931-06-21, 79 y.o.   MRN: [id number]   Raymond Rangel 06/22/2012, 7:17 PM

## 2012-06-22 NOTE — Progress Notes (Signed)
Patient ID: Raymond Rangel, male   DOB: 18-Nov-1931, 79 y.o.   MRN: [id number]  Subjective/Complaints: 79 y.o. right-handed male history of TIA, diabetes mellitus and dementia. 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 Admitted 05/26/2012 with right-sided weakness and slurred speech. MRI shows acute left PCA territory infarcts without hemorrhage. MRA of the head with 50% mid basilar stenosis. Proximal occlusion left PCA. Echocardiogram with ejection fraction 60% and grade 1 diastolic dysfunction. Carotid Dopplers with no ICA stenosis. Patient did not receive TPA. Neurology consulted patient maintained on aspirin 81 mg daily as prior to admission and Plavix added to regimen. Subcutaneous Lovenox for DVT prophylaxis. Currently patient is maintained on a dysphagia 1 thin liquid diet after modified barium swallow study 05/28/2012.Monitoring of cognition with noted history of dementia placed on Seroquel.  More alert, remains afebrile, working with physical therapy. Still has tone with the plantar flexors and toe flexors in the right lower extremity as well as the elbow flexors finger and wrist flexors in the right upper remedy.  Review of Systems  Review of Systems  Constitutional: Negative.   Respiratory: Positive for cough. Negative for hemoptysis, sputum production, shortness of breath and wheezing.        With meals  Gastrointestinal: Negative.   Neurological: Positive for focal weakness.  All other systems reviewed and are negative.    Objective: Vital Signs: Blood pressure 118/72, pulse 73, temperature 97.3 F (36.3 C), temperature source Oral, resp. rate 18, height 5\' 6"  (1.676 m), weight 62 kg (136 lb 11 oz), SpO2 97.00%. Dg Swallowing Func-speech Pathology  05/29/2012  [name], CCC-SLP     05/29/2012 11:55 AM   Recommend  to proceed with dysphagia 1 (puree) and thin liquids with full  assist with each meals to ensure safety.  Recommend puree mainly  due to  severity of oral dysphagia.  Please note patient required  hand over hand assist for self feeding with majority of PO trials  administered by treating SLP.  Only trace silent aspiration  occurred with nectar thick liquids and x1 with thin liquid but  patient was in optimal upright positioning.       Results for orders placed during the hospital encounter of 05/30/12 (from the past 72 hour(s))  GLUCOSE, CAPILLARY     Status: Abnormal   Collection Time    06/19/12 11:23 AM      Result Value Range   Glucose-Capillary 100 (*) 70 - 99 mg/dL   Comment 1 Notify RN    GLUCOSE, CAPILLARY     Status: Abnormal   Collection Time    06/19/12  4:21 PM      Result Value Range   Glucose-Capillary 148 (*) 70 - 99 mg/dL   Comment 1 Notify RN    GLUCOSE, CAPILLARY     Status: Abnormal   Collection Time    06/19/12  8:28 PM      Result Value Range   Glucose-Capillary 142 (*) 70 - 99 mg/dL  CREATININE, SERUM     Status: Abnormal   Collection Time    06/20/12  6:13 AM      Result Value Range   Creatinine, Ser 0.97  0.50 - 1.35 mg/dL   GFR calc non Af Amer 77 (*) >90 mL/min   GFR calc Af Amer 89 (*) >90 mL/min   Comment:            The eGFR has been calculated     using  the CKD EPI equation.     This calculation has not been     validated in all clinical     situations.     eGFR's persistently     <90 mL/min signify     possible Chronic Kidney Disease.  GLUCOSE, CAPILLARY     Status: Abnormal   Collection Time    06/20/12  7:19 AM      Result Value Range   Glucose-Capillary 105 (*) 70 - 99 mg/dL  GLUCOSE, CAPILLARY     Status: None   Collection Time    06/20/12 11:28 AM      Result Value Range   Glucose-Capillary 94  70 - 99 mg/dL   Comment 1 Notify RN    CBC WITH DIFFERENTIAL     Status: Abnormal   Collection Time    06/20/12 11:52 AM      Result Value Range   WBC 14.5 (*) 4.0 - 10.5 K/uL   Comment: WHITE COUNT CONFIRMED ON SMEAR   RBC 4.90  4.22 - 5.81 MIL/uL   Hemoglobin 14.5  13.0  - 17.0 g/dL   HCT 40.9  81.1 - 91.4 %   MCV 86.1  78.0 - 100.0 fL   MCH 29.6  26.0 - 34.0 pg   MCHC 34.4  30.0 - 36.0 g/dL   RDW 78.2  95.6 - 21.3 %   Platelets 296  150 - 400 K/uL   Comment: PLATELET COUNT CONFIRMED BY SMEAR   Neutrophils Relative 76  43 - 77 %   Lymphocytes Relative 14  12 - 46 %   Monocytes Relative 7  3 - 12 %   Eosinophils Relative 3  0 - 5 %   Basophils Relative 0  0 - 1 %   Neutro Abs 11.1 (*) 1.7 - 7.7 K/uL   Lymphs Abs 2.0  0.7 - 4.0 K/uL   Monocytes Absolute 1.0  0.1 - 1.0 K/uL   Eosinophils Absolute 0.4  0.0 - 0.7 K/uL   Basophils Absolute 0.0  0.0 - 0.1 K/uL  GLUCOSE, CAPILLARY     Status: None   Collection Time    06/20/12  4:50 PM      Result Value Range   Glucose-Capillary 99  70 - 99 mg/dL   Comment 1 Notify RN    GLUCOSE, CAPILLARY     Status: Abnormal   Collection Time    06/20/12  8:36 PM      Result Value Range   Glucose-Capillary 100 (*) 70 - 99 mg/dL  GLUCOSE, CAPILLARY     Status: Abnormal   Collection Time    06/21/12  7:23 AM      Result Value Range   Glucose-Capillary 113 (*) 70 - 99 mg/dL   Comment 1 Notify RN    GLUCOSE, CAPILLARY     Status: Abnormal   Collection Time    06/21/12 11:47 AM      Result Value Range   Glucose-Capillary 115 (*) 70 - 99 mg/dL   Comment 1 Notify RN    GLUCOSE, CAPILLARY     Status: Abnormal   Collection Time    06/21/12  4:23 PM      Result Value Range   Glucose-Capillary 102 (*) 70 - 99 mg/dL  GLUCOSE, CAPILLARY     Status: None   Collection Time    06/21/12  9:39 PM      Result Value Range   Glucose-Capillary 96  70 - 99 mg/dL  Comment 1 Notify RN    GLUCOSE, CAPILLARY     Status: None   Collection Time    06/22/12  7:11 AM      Result Value Range   Glucose-Capillary 95  70 - 99 mg/dL   Comment 1 Notify RN       HEENT: normal Cardio: RRR Resp: CTA B/L GI: BS positive and Distention Extremity:  Pulses positive and No Edema Skin:   Intact Neuro: Alert/Oriented, Confused but    attentive today. Doesn't realize he's in the hospital or what has happened to him. Flat, Cranial Nerve II-XII normal, Abnormal Sensory Reduced sensation on Right but also has poor attention to testing, Abnormal Motor 2-/5 in RUE and 3-/5 RLE, 4/5 LUE, 3-/5 LLE and Tone:  increased flexor tone RUE, increased extensor tone RLE Musc/Skel:  Other Old scar L antecubital fossa, non tender Gen:  NAD Cognitive: oriented to person and to hospital but not date Righ homonymous hemianopsia persistent  Assessment/Plan: 1. Functional deficits secondary to Left PCA infarct which require 3+ hours per day of interdisciplinary therapy in a comprehensive inpatient rehab setting. Physiatrist is providing close team supervision and 24 hour management of active medical problems listed below. Physiatrist and rehab team continue to assess barriers to discharge/monitor patient progress toward functional and medical goals. FIM: FIM - Bathing Bathing Steps Patient Completed: Chest;Right Arm;Abdomen;Front perineal area;Right upper leg;Left upper leg Bathing: 3: Mod-Patient completes 5-7 83f 10 parts or 50-74%  FIM - Upper Body Dressing/Undressing Upper body dressing/undressing steps patient completed: Thread/unthread left sleeve of pullover shirt/dress;Put head through opening of pull over shirt/dress;Pull shirt over trunk Upper body dressing/undressing: 4: Min-Patient completed 75 plus % of tasks FIM - Lower Body Dressing/Undressing Lower body dressing/undressing steps patient completed: Thread/unthread left pants leg Lower body dressing/undressing: 2: Max-Patient completed 25-49% of tasks  FIM - Toileting Toileting: 1: Two helpers  FIM - Diplomatic Services operational officer Devices: Therapist, music Transfers: 1-Two helpers  FIM - Banker Devices: Orthosis Bed/Chair Transfer: 2: Bed > Chair or W/C: Max A (lift and lower assist);3: Supine > Sit: Mod A (lifting  assist/Pt. 50-74%/lift 2 legs;4: Sit > Supine: Min A (steadying pt. > 75%/lift 1 leg);2: Chair or W/C > Bed: Max A (lift and lower assist)  FIM - Locomotion: Wheelchair Distance: 15 Locomotion: Wheelchair: 1: Total Assistance/staff pushes wheelchair (Pt<25%) FIM - Locomotion: Ambulation Locomotion: Ambulation Assistive Devices: Orthosis Ambulation/Gait Assistance: 3: Mod assist Locomotion: Ambulation: 1: Travels less than 50 ft with moderate assistance (Pt: 50 - 74%)  Comprehension Comprehension Mode: Auditory Comprehension: 1-Understands basic less than 25% of the time/requires cueing 75% of the time  Expression Expression Mode: Verbal Expression: 1-Expresses basis less than 25% of the time/requires cueing greater than 75% of the time.  Social Interaction Social Interaction: 2-Interacts appropriately 25 - 49% of time - Needs frequent redirection.  Problem Solving Problem Solving Mode: Not assessed Problem Solving: 1-Solves basic less than 25% of the time - needs direction nearly all the time or does not effectively solve problems and may need a restraint for safety  Memory Memory Mode: Not assessed Memory: 1-Recognizes or recalls less than 25% of the time/requires cueing greater than 75% of the time Medical Problem List and Plan:  1. embolic left PCA infarct  2. DVT Prophylaxis/Anticoagulation: Subcutaneous Lovenox. Monitor platelet counts and any signs of bleeding Hx of Left femoral L popliteal,Left tibial subacute DVT diagnosed January 2013.lovenox 30 bid currently 3. Mood/dementia. Seroquel 25 mg  twice a day, Ativan when necessary. Patient displayed baseline cognitive deficits per family. Will expect wax and wane.Trial Aricept- insomnia related to Aricept improved after a couple days will monitor 4. Neuropsych: This patient is not capable of making decisions on his/her own behalf.  5. Hyperlipidemia. Zocor  6. Diabetes mellitus with peripheral neuropathy. Hemoglobin A1c 6.7.  Patient on diet control prior to admission. Check CBGs a.c. and at bedtime- fair control generally. Cover spikes with SSI for now. 7. Dysphagia. Dysphagia 1 thin liquids. Speech therapy followup. Monitor for any signs of aspiration.  8. UTI recurrent now on nitrofurantoin 9.  Constipation nsg to give sorbitol 10.  Spasticity tone improved on Zanaflex and  baclofen,  Both caused sedation, may need botox as outpt  LOS (Days) 23 A FACE TO FACE EVALUATION WAS PERFORMED  KIRSTEINS,ANDREW E 06/22/2012, 8:48 AM

## 2012-06-22 NOTE — Progress Notes (Signed)
Physical Therapy Session Note  Patient Details  Name: Smitty Ackerley MRN: [id number] Date of Birth: 06-19-1931  Today's Date: 06/22/2012 Time: 1205-1310 Time Calculation (min): 65 min  Short Term Goals: Week 3:LTGs        Skilled Therapeutic Interventions/Progress Updates:  Home evaluation with OT.  Pt's daughter Lona Millard and wife were present in the home.  [name] stated that an entrance ramp will be built tomorrow.  Pt stepped up/down 3 steps with 1 rail with +2 assist. Doorway into home was tight due to storm door.  Doorway into BR was tight for w/c.  Home was generally accessible to w/c.  W/c><bed><commode chair performed by daughter, safely.  OT and PT discussed all issues with daughter, [name].    See hard copy of Home Visit Checklist in shadow chart.      Therapy Documentation Precautions:  Precautions Precautions: Fall Precaution Comments: increased tone on right side (extensor in leg and flexor in arm), decreased midline orientation, R field cut , dementia Restrictions Weight Bearing Restrictions: No   Pain: Pain Assessment Pain Assessment: No/denies pain     See FIM for current functional status  Therapy/Group: I Co-Treatment  COOK,CAROLINE 06/22/2012, 1:42 PM

## 2012-06-22 NOTE — Progress Notes (Signed)
Social Work Patient ID: Raymond Rangel, male   DOB: 29-Oct-1931, 79 y.o.   MRN: [id number] Spoke with daughter who reports the home eval went well and they plan to build a ramp this weekend. She feels everything is coming together and feels much more comfortable about discharge Monday.   Work on discharge for Monday-DME delivered an set up at home.

## 2012-06-22 NOTE — Progress Notes (Signed)
Physical Therapy Note  Patient Details  Name: Raymond Rangel MRN: [id number] Date of Birth: Dec 14, 1931 Today's Date: 06/22/2012  2:05 - 2:48 43 minutes Individual session Patient denies pain.  Patient reports being very fatigued from home evaluation. Patient transferred wheelchair to and from mat stand pivot with max assist.  Patient outside parallel bars using UE's to pull on bars for sit to stand. Patient sit to stand with min assist using bars to pull up. Patient maintained static standing for 1 minute with mod assist pushing heavily to right and 1 minute with close supervision using bilateral UE's for support. Patient exercised on kinetron stepping 20 steps with each LE x 3 sets. Patient propelled wheelchair using LE's/quads to push chair backwards. Patient transferred wheelchair to bed with max assist stand pivot. Patient sit to supine in bed with min assist - lifting right LE. Patient needed mod assist to slide up in bed. Patient left in bed with call bell in reach. Arelia Longest M 06/22/2012, 4:19 PM

## 2012-06-22 NOTE — Progress Notes (Signed)
Speech Language Pathology Daily Session Note  Patient Details  Name: Raymond Rangel MRN: [id number] Date of Birth: 1931-10-25  Today's Date: 06/22/2012 Time: 0835-0900 Time Calculation (min): 25 min  Short Term Goals: Week 4: SLP Short Term Goal 1 (Week 4): Patient will follow 1 step directions during functional, familiar tasks with mod assist verbal and visual cues. SLP Short Term Goal 2 (Week 4): Patient will demonstrate basic problem solving during functional, familiar tasks with mod assist verbal and visual cues. SLP Short Term Goal 3 (Week 4): Patient will demonstrate basic problem solving during functional, familiar tasks with mod assist verbal and visual cues. SLP Short Term Goal 4 (Week 4): Family will demonstrate 2 cuing strategies to utilize with patient durig completion of basic self care tasks.  Skilled Therapeutic Interventions: Skilled treatment session focused on addressing dysphagia and self-feeding goals.  Upon SLP entering room daughter assisting patient with lunch and was demonstrating appropriate use of cuing strategies; due to fatigue patient refusing textures and daughter was assisting with consumption of Ensure.  SLP provided with handout specifying recommendations and restrictions for Dys.2 textures. Daughter was eager for information and verbalized understanding.     FIM:  Comprehension Comprehension Mode: Auditory Comprehension: 2-Understands basic 25 - 49% of the time/requires cueing 51 - 75% of the time Expression Expression Mode: Verbal Expression: 2-Expresses basic 25 - 49% of the time/requires cueing 50 - 75% of the time. Uses single words/gestures. Social Interaction Social Interaction: 2-Interacts appropriately 25 - 49% of time - Needs frequent redirection. Problem Solving Problem Solving: 1-Solves basic less than 25% of the time - needs direction nearly all the time or does not effectively solve problems and may need a restraint for safety Memory Memory:  1-Recognizes or recalls less than 25% of the time/requires cueing greater than 75% of the time FIM - Eating Eating Activity: 3: Helper scoops food on utensil every scoop  Pain Pain Assessment Pain Assessment: No/denies pain Pain Score: 0-No pain  Therapy/Group: Individual Therapy  Charlane Ferretti., CCC-SLP [phone]  Azavier Creson 06/22/2012, 9:46 AM

## 2012-06-22 NOTE — Progress Notes (Signed)
Occupational Therapy Session Note  Patient Details  Name: Raymond Rangel MRN: [id number] Date of Birth: [birth year]  Today's Date: 06/22/2012 Time: 1100-1205 (2 hr and 10 min co-treat Home assessment with CC- Physical Therapist) Time Calculation (min): 65 min  Short Term Goals: Week 4:  OT Short Term Goal 1 (Week 4): Pt will transfer to the [facility] with his daughter assisting him with mod to max assist. OT Short Term Goal 2 (Week 4): Pt will stand up holding onto bed rail with mod assist so that his daughter can assist him with clothing management.  Skilled Therapeutic Interventions/Progress Updates:  On-site home assessment with PT.  Please refer to form located in shadow chart.  Therapy Documentation Precautions:  Precautions Precautions: Fall Precaution Comments: increased tone on right side (extensor in leg and flexor in arm), decreased midline orientation, dementia Restrictions Weight Bearing Restrictions: No Pain: Pain Assessment Pain Assessment: No/denies pain  Therapy/Group: Home Evaluation with PT  Chalisa Kobler 06/22/2012, 2:47 PM

## 2012-06-23 ENCOUNTER — Inpatient Hospital Stay (HOSPITAL_COMMUNITY): Payer: Medicare Other | Admitting: Speech Pathology

## 2012-06-23 ENCOUNTER — Inpatient Hospital Stay (HOSPITAL_COMMUNITY): Payer: Medicare Other | Admitting: *Deleted

## 2012-06-23 LAB — GLUCOSE, CAPILLARY
Glucose-Capillary: 103 mg/dL — ABNORMAL HIGH (ref 70–99)
Glucose-Capillary: 108 mg/dL — ABNORMAL HIGH (ref 70–99)
Glucose-Capillary: 125 mg/dL — ABNORMAL HIGH (ref 70–99)

## 2012-06-23 NOTE — Progress Notes (Signed)
Raymond Rangel is a 79 y.o. male Sep 05, 1931 [id number]  Subjective: No new complaints. No new problems. Slept well. Feeling OK.  Objective: Vital signs in last 24 hours: Temp:  [98.1 F (36.7 C)] 98.1 F (36.7 C) (03/15 0500) Pulse Rate:  [71-75] 75 (03/15 0500) Resp:  [18] 18 (03/15 0500) BP: (101-121)/(66-79) 101/66 mmHg (03/15 0500) SpO2:  [96 %-98 %] 96 % (03/15 0500) Weight change:  Last BM Date: 06/21/12  Intake/Output from previous day: 03/14 0701 - 03/15 0700 In: 480 [P.O.:480] Out: 200 [Urine:200]  Physical Exam General: No apparent distress    Lungs: Normal effort. Lungs clear to auscultation, no crackles or wheezes. Cardiovascular: Regular rate and rhythm, no edema Musculoskeletal:  Neurovascularly intact Neurological: No new neurological deficits Wounds: N/A      Lab Results: BMET    Component Value Date/Time   NA 135 06/17/2012 1524   K 4.0 06/17/2012 1524   CL 101 06/17/2012 1524   CO2 23 06/17/2012 1524   GLUCOSE 101* 06/17/2012 1524   BUN 17 06/17/2012 1524   CREATININE 0.97 06/20/2012 0613   CALCIUM 9.2 06/17/2012 1524   GFRNONAA 77* 06/20/2012 0613   GFRAA 89* 06/20/2012 0613   CBC    Component Value Date/Time   WBC 14.5* 06/20/2012 1152   RBC 4.90 06/20/2012 1152   HGB 14.5 06/20/2012 1152   HCT 42.2 06/20/2012 1152   PLT 296 06/20/2012 1152   MCV 86.1 06/20/2012 1152   MCH 29.6 06/20/2012 1152   MCHC 34.4 06/20/2012 1152   RDW 15.1 06/20/2012 1152   LYMPHSABS 2.0 06/20/2012 1152   MONOABS 1.0 06/20/2012 1152   EOSABS 0.4 06/20/2012 1152   BASOSABS 0.0 06/20/2012 1152   CBG's (last 3):   Recent Labs  06/22/12 1754 06/22/12 2102 06/23/12 0738  GLUCAP 113* 90 103*   LFT's Lab Results  Component Value Date   ALT 15 05/31/2012   AST 23 05/31/2012   ALKPHOS 107 05/31/2012   BILITOT 0.5 05/31/2012    Studies/Results: No results found.  Medications:  I have reviewed the patient's current medications. Scheduled Medications: . aspirin  81 mg Oral Daily  .  clopidogrel  75 mg Oral Q breakfast  . donepezil  5 mg Oral QHS  . enoxaparin (LOVENOX) injection  30 mg Subcutaneous Q12H  . feeding supplement  237 mL Oral BID BM  . nitrofurantoin (macrocrystal-monohydrate)  100 mg Oral Q12H  . QUEtiapine  25 mg Oral BID  . simvastatin  40 mg Oral QHS   PRN Medications: acetaminophen, LORazepam, ondansetron (ZOFRAN) IV, ondansetron, sorbitol  Assessment/Plan: Principal Problem:   CVA (cerebral infarction) Active Problems:   CAD (coronary artery disease)   Dementia   Diabetes mellitus, type 2   Heat stroke   Homonymous hemianopsia due to recent stroke Functional deficits secondary to Left PCA infarct  1. embolic left PCA infarct  2. DVT Prophylaxis/Anticoagulation: Subcutaneous Lovenox. Monitor platelet counts and any signs of bleeding Hx of Left femoral L popliteal,Left tibial subacute DVT diagnosed January 2013.lovenox 30 bid currently  3. Mood/dementia. Seroquel 25 mg twice a day, Ativan when necessary. Patient displayed baseline cognitive deficits per family. Will expect wax and wane.Trial Aricept- insomnia related to Aricept improved after a couple days will monitor  4. Neuropsych: This patient is not capable of making decisions on his/her own behalf.  5. Hyperlipidemia. Zocor  6. Diabetes mellitus with peripheral neuropathy. Hemoglobin A1c 6.7. Patient on diet control prior to admission. Check CBGs a.c. and at bedtime-  fair control generally. Cover spikes with SSI for now.  7. Dysphagia. Dysphagia 1 thin liquids. Speech therapy followup. Monitor for any signs of aspiration.  8. UTI recurrent -now on nitrofurantoin  9. Constipation nsg to give sorbitol 10. Spasticity tone improved on Zanaflex and baclofen, Both caused sedation, may need botox as outpt      Length of stay, days: 24  Susan C. Dennison Bulla , PA-C 06/23/2012, 8:50 AM   I have personally taken a history, examined the patient and agree with above.  Jadin Creque A. Felicity Coyer, MD

## 2012-06-23 NOTE — Progress Notes (Signed)
Occupational Therapy Note  Patient Details  Name: Raymond Rangel MRN: [id number] Date of Birth: April 11, 1932 Today's Date: 06/23/2012  Time:  0900-1000  (60 min) Individual session Pain:  None  Engaged in therapeutic bathing and dressing, sit to stand,bed mobility, standing balance at sink, pt following one step commands, and motor planning, NMRE of RUE.   Pt. Lying in bed.  Needed manual facilitation and verbal cues to go from supine to sit EOB.  Transferred from bed to wc with max assist and multiple cues to motor plan.  Pt sat at sink for bathing and dressing.  Wife present and assisted pt with UB bathing and dressing.  Wife voiced concern that she would not help with transfers and sit to stand.  Reiterated that other family members will have to do this for pt.  Left pt in wc with call bell in place and safety belt.    [name] 06/23/2012, 5:43 PM

## 2012-06-23 NOTE — Plan of Care (Signed)
Problem: RH BLADDER ELIMINATION Goal: RH STG MANAGE BLADDER WITH ASSISTANCE STG Manage Bladder With max assist timed toileting  Outcome: Not Progressing Pt. Remains incontinent of urine; timed toileting recommended  Problem: RH SAFETY Goal: RH STG ADHERE TO SAFETY PRECAUTIONS W/ASSISTANCE/DEVICE STG Adhere to Safety Precautions With min assist  Outcome: Progressing Full Supervision when in Q.R./ chair.  Set bed alarm at all times for safety measures.

## 2012-06-23 NOTE — Progress Notes (Signed)
Speech Language Pathology Daily Session Note  Patient Details  Name: Raymond Rangel MRN: [id number] Date of Birth: 1931-06-11  Today's Date: 06/23/2012 Time: 1430-1455 Time Calculation (min): 25 min  Short Term Goals: Week 4: SLP Short Term Goal 1 (Week 4): Patient will follow 1 step directions during functional, familiar tasks with mod assist verbal and visual cues. SLP Short Term Goal 2 (Week 4): Patient will demonstrate basic problem solving during functional, familiar tasks with mod assist verbal and visual cues. SLP Short Term Goal 3 (Week 4): Patient will demonstrate basic problem solving during functional, familiar tasks with mod assist verbal and visual cues. SLP Short Term Goal 4 (Week 4): Family will demonstrate 2 cuing strategies to utilize with patient durig completion of basic self care tasks.  Skilled Therapeutic Interventions: Treatment focus on family education. Pt's daughter provided education in regards to current swallowing function, diet recommendations, swallowing compensatory strategies, cognitive impairments, and strategies to utilize at home to increase safety, problem solving, and recall. Pt's daughter verbalized understanding and provided handouts.    FIM:  Comprehension Comprehension Mode: Auditory Comprehension: 2-Understands basic 25 - 49% of the time/requires cueing 51 - 75% of the time Expression Expression Mode: Verbal Expression: 2-Expresses basic 25 - 49% of the time/requires cueing 50 - 75% of the time. Uses single words/gestures. Social Interaction Social Interaction: 2-Interacts appropriately 25 - 49% of time - Needs frequent redirection. Problem Solving Problem Solving: 1-Solves basic less than 25% of the time - needs direction nearly all the time or does not effectively solve problems and may need a restraint for safety Memory Memory: 1-Recognizes or recalls less than 25% of the time/requires cueing greater than 75% of the time  Pain Pain  Assessment Pain Assessment: No/denies pain  Therapy/Group: Individual Therapy  Sion Thane 06/23/2012, 3:19 PM

## 2012-06-24 ENCOUNTER — Inpatient Hospital Stay (HOSPITAL_COMMUNITY): Payer: Medicare Other | Admitting: *Deleted

## 2012-06-24 LAB — GLUCOSE, CAPILLARY
Glucose-Capillary: 112 mg/dL — ABNORMAL HIGH (ref 70–99)
Glucose-Capillary: 100 mg/dL — ABNORMAL HIGH (ref 70–99)
Glucose-Capillary: 113 mg/dL — ABNORMAL HIGH (ref 70–99)

## 2012-06-24 NOTE — Discharge Summary (Signed)
NAMEZEBULUN, Raymond Rangel                ACCOUNT NO.:  [id number]  MEDICAL RECORD NO.:  [id number]  LOCATION:  4037                         FACILITY:  [facility]  PHYSICIAN:  Erick Colace, M.D.DATE OF BIRTH:  May 07, 1931  DATE OF ADMISSION:  05/30/2012 DATE OF DISCHARGE:  06/25/2012                              DISCHARGE SUMMARY   DISCHARGE DIAGNOSES: 1. Embolic left PCA infarct. 2. Subcutaneous Lovenox for DVT prophylaxis. 3. Dementia. 4. Hyperlipidemia. 5. Diabetes mellitus. 6. Dysphagia. 7. E coli urinary tract infection.  HISTORY OF PRESENT ILLNESS:  This is a 79 year old right-handed male with history of diabetes mellitus dementia who lives with his wife and attends adult daycare for senior citizens.  Admitted May 26, 2012 with right-sided weakness and slurred speech.  MRI showed left PCA territory infarct without hemorrhage.  MRA of the head with 50% mid basilar stenosis.  Proximal occlusion left PCA.  Echocardiogram with ejection fraction 60% and grade 1 diastolic dysfunction.  Carotid Doppler with no ICA stenosis.  The patient did not receive tPA.  Neurology Service was consulted, maintained on aspirin therapy as well as Plavix added to regimen.  Subcutaneous Lovenox for DVT prophylaxis.  The patient was maintained on dysphagia one thin liquid diet per modified barium swallow.  Monitoring of cognition and noted history of dementia, maintained on Seroquel.  The patient was admitted for comprehensive rehab program.  PAST MEDICAL HISTORY:  See discharge diagnoses.  SOCIAL HISTORY:  Lives with spouse.  FUNCTIONAL HISTORY PRIOR TO ADMISSION:  Retired.  Does not drive. Attends adult daycare.  FUNCTIONAL STATUS UPON ADMISSION TO REHAB SERVICES:  With +2 total assist for stand pivot transfers.  PHYSICAL EXAMINATION:  VITAL SIGNS:  Blood pressure 150/83, pulse 79 temperature 96, respiration 18. GENERAL:  This was a lethargic male but arousable, dysarthric speech  but intelligible, difficult to maintain attention he did state his name.  He could not state his wife's name.  He did follow simple commands. LUNGS:  Clear to auscultation. CARDIAC:  Rate controlled. ABDOMEN:  Soft, nontender.  Good bowel sounds.  REHABILITATION HOSPITAL COURSE:  The patient was admitted to inpatient rehab services with therapies initiated on a 77-hour daily basis consisting of physical therapy, occupational therapy, speech therapy, and rehabilitation nursing.  The following issues were addressed during the patient's rehabilitation stay.  Pertaining to this patient's embolic left PCA infarct remained stable.  He would follow up neurology services.  He is maintained on aspirin, Plavix therapy.  Subcutaneous Lovenox for DVT prophylaxis.  He did have a history of dementia, appeared to be maintained at his baseline.  He continued on Seroquel. His diet was slowly advanced per speech therapy due to dysphagia to thin liquid diet, but no signs of aspiration.  He did remain on Aricept for his history of dementia.  During his hospital course.  He was treated for an E coli urinary tract infection with Macrobid.  The patient received weekly collaborative interdisciplinary team conferences to discuss estimated length of stay, family teaching, and any barriers to his discharge.  The patient was minimal assist with supine-to-sit, transfer from bed-to-wheelchair max assist, assisted with bathing and dressing at sink.  He used right upper  extremity as a gross assist.  The patient went from sit-to-stand with moderate assist by pulling from the sink.  Maintained standing bound with minimal assistance.  The patient using parallel bars, the upper extremities to pull on the bars from sit- to-stand.  He was able to sit-to-stand at minimal, he was embargoed to pull up.  Propelled his wheelchair using lower extremity quadriceps to push chairs backwards.  A full family teaching was completed, a  ramp was to the manufacture to the home and discharged to take place on June 25, 2012 with family teaching completed.  DISCHARGE MEDICATIONS: 1. Aspirin 81 mg p.o. daily. 2. Plavix 75 mg p.o. daily. 3. Aricept 5 mg p.o. at bedtime. 4. Macrobid 100 mg p.o. every 12 hours x2 more days. 5. Seroquel 25 mg p.o. b.i.d. 6. Zocor 40 mg at bedtime.  DIET:  Dysphagia to thin liquid diet.  SPECIAL INSTRUCTIONS:  Continue therapies as advised per rehab services. The patient would follow up Dr. Claudette Laws at the outpatient rehab service office as advised; Dr. Delia Heady, neurology services, call for appointment.     Mariam Dollar, P.A.   ______________________________ Erick Colace, M.D.    DA/MEDQ  D:  06/24/2012  T:  06/24/2012  Job:  [id number]  cc:   Erick Colace, M.D. Pramod P. Pearlean Brownie, MD [name], MD

## 2012-06-24 NOTE — Progress Notes (Addendum)
Occupational Therapy Session Note  Patient Details  Name: Raymond Rangel MRN: [id number] Date of Birth: 1932-03-17  Today's Date: 06/24/2012 Time:  -   11:00-12:15  (75 min)    Short Term Goals: Week 1:  OT Short Term Goal 1 (Week 1): Pt will roll in bed with mod assist to assist caregivers with clothing changes. OT Short Term Goal 1 - Progress (Week 1): Progressing toward goal OT Short Term Goal 2 (Week 1): Pt will sit on EOB with mod assist to engage in grooming tasks. OT Short Term Goal 2 - Progress (Week 1): Met OT Short Term Goal 3 (Week 1): Pt will don shirt with max assist. OT Short Term Goal 3 - Progress (Week 1): Met Week 2:  OT Short Term Goal 1 (Week 2): Pt will consistently transfer to the toilet using a squat pivot transfer with max assist x1. OT Short Term Goal 1 - Progress (Week 2): Met OT Short Term Goal 2 (Week 2): Pt will sit to stand from toilet and maintain standing with mod assist. OT Short Term Goal 2 - Progress (Week 2): Progressing toward goal OT Short Term Goal 3 (Week 2): Pt will demonstrate improved RUE motor control by bathing left arm with min assist. OT Short Term Goal 3 - Progress (Week 2): Met Week 3:  OT Short Term Goal 1 (Week 3): Pt will transfer to tub bench in tub with mod to max assist. OT Short Term Goal 1 - Progress (Week 3): Met OT Short Term Goal 2 (Week 3): Pt will transfer to the toilet with his daughter assisting him with mod to max assist. OT Short Term Goal 2 - Progress (Week 3): Progressing toward goal OT Short Term Goal 4 (Week 3): Pt will stand at toilet with mod assist so his daughter can assist him with clothing management. OT Short Term Goal 4 - Progress (Week 3): Progressing toward goal Week 4:  OT Short Term Goal 1 (Week 4): Pt will transfer to the [facility] with his daughter assisting him with mod to max assist. OT Short Term Goal 2 (Week 4): Pt will stand up holding onto bed rail with mod assist so that his daughter can assist him with  clothing management.  Skilled Therapeutic Interventions/Progress Updates:    Addressed bed mobility, sitting balance, transfers, ADL retraining at sink level. Pt. Was minimal assist with supine to sit.  Pt. Transferred from bed to wc with ma;x assist.  Pt. Assisted with bathing and dressing at sink.  Used RUE as gross assist.   Pt went from sit to stand with mod assist by pulling from sink.  Maintained standing balance with minimal assist.  Pt. Following commands better today and able to control RLE with knee flexion.  Wife observed session and was pleased with his session.  .      Therapy Documentation Precautions:  Precautions Precautions: Fall Precaution Comments: increased tone on right side (extensor in leg and flexor in arm), decreased midline orientation, dementia Restrictions Weight Bearing Restrictions: No     Pain: Pain Assessment Pain Score: 0-No pain Faces Pain Scale: No hurt ADL:   Exercises:   Other Treatments:    See FIM for current functional status  Therapy/Group: Individual Therapy  [name] 06/24/2012, 11:18 AM

## 2012-06-24 NOTE — Progress Notes (Signed)
Raymond Rangel is a 79 y.o. male 1932-03-29 [id number]  Subjective: No new complaints. Very thankful and appreciative of rehab efforts. Reports feeling well.   Objective: Vital signs in last 24 hours: Temp:  [98.2 F (36.8 C)] 98.2 F (36.8 C) (03/15 1539) Pulse Rate:  [79] 79 (03/15 1539) Resp:  [18] 18 (03/15 1539) BP: (144)/(84) 144/84 mmHg (03/15 1539) SpO2:  [96 %] 96 % (03/15 1539) Weight change:  Last BM Date: 06/24/12  Intake/Output from previous day: 03/15 0701 - 03/16 0700 In: 540 [P.O.:540] Out: -   Physical Exam General: No apparent distress    Lungs: Normal effort. Lungs clear to auscultation, no crackles or wheezes. Cardiovascular: Regular rate and rhythm, no edema Musculoskeletal:  Neurovascularly intact Neurological: No new neurological deficits Wounds: N/A      Lab Results: BMET    Component Value Date/Time   NA 135 06/17/2012 1524   K 4.0 06/17/2012 1524   CL 101 06/17/2012 1524   CO2 23 06/17/2012 1524   GLUCOSE 101* 06/17/2012 1524   BUN 17 06/17/2012 1524   CREATININE 0.97 06/20/2012 0613   CALCIUM 9.2 06/17/2012 1524   GFRNONAA 77* 06/20/2012 0613   GFRAA 89* 06/20/2012 0613   CBC    Component Value Date/Time   WBC 14.5* 06/20/2012 1152   RBC 4.90 06/20/2012 1152   HGB 14.5 06/20/2012 1152   HCT 42.2 06/20/2012 1152   PLT 296 06/20/2012 1152   MCV 86.1 06/20/2012 1152   MCH 29.6 06/20/2012 1152   MCHC 34.4 06/20/2012 1152   RDW 15.1 06/20/2012 1152   LYMPHSABS 2.0 06/20/2012 1152   MONOABS 1.0 06/20/2012 1152   EOSABS 0.4 06/20/2012 1152   BASOSABS 0.0 06/20/2012 1152   CBG's (last 3):    Recent Labs  06/23/12 1651 06/23/12 2129 06/24/12 0715  GLUCAP 77 125* 95   LFT's Lab Results  Component Value Date   ALT 15 05/31/2012   AST 23 05/31/2012   ALKPHOS 107 05/31/2012   BILITOT 0.5 05/31/2012    Studies/Results: No results found.  Medications:  I have reviewed the patient's current medications. Scheduled Medications: . aspirin  81 mg Oral Daily   . clopidogrel  75 mg Oral Q breakfast  . donepezil  5 mg Oral QHS  . enoxaparin (LOVENOX) injection  30 mg Subcutaneous Q12H  . feeding supplement  237 mL Oral BID BM  . nitrofurantoin (macrocrystal-monohydrate)  100 mg Oral Q12H  . QUEtiapine  25 mg Oral BID  . simvastatin  40 mg Oral QHS   PRN Medications: acetaminophen, LORazepam, ondansetron (ZOFRAN) IV, ondansetron, sorbitol  Assessment/Plan: Principal Problem:   CVA (cerebral infarction) Active Problems:   CAD (coronary artery disease)   Dementia   Diabetes mellitus, type 2   Heat stroke   Homonymous hemianopsia due to recent stroke Functional deficits secondary to Left PCA infarct  1. embolic left PCA infarct  2. DVT Prophylaxis/Anticoagulation: Subcutaneous Lovenox. Monitor platelet counts and any signs of bleeding Hx of Left femoral L popliteal,Left tibial subacute DVT diagnosed January 2013.lovenox 30 bid currently  3. Mood/dementia. Seroquel 25 mg twice a day, Ativan when necessary. Patient displayed baseline cognitive deficits per family. Will expect wax and wane.Trial Aricept- insomnia related to Aricept improved after a couple days will monitor  4. Neuropsych: This patient is not capable of making decisions on his/her own behalf.  5. Hyperlipidemia. Zocor  6. Diabetes mellitus with peripheral neuropathy. Hemoglobin A1c 6.7. Patient on diet control prior to admission. Check CBGs  a.c. and at bedtime- fair control generally. Cover spikes with SSI for now.  7. Dysphagia. Dysphagia 1 thin liquids. Speech therapy followup. Monitor for any signs of aspiration.  8. UTI recurrent -now on nitrofurantoin  9. Constipation nsg to give sorbitol 10. Spasticity tone improved on Zanaflex and baclofen, Both caused sedation, may need botox as outpt      Length of stay, days: 25  Susan C. Dennison Bulla , PA-C 06/24/2012, 8:22 AM   I have personally taken a history, examined the patient and agree with above.  Valerie A. Felicity Coyer, MD

## 2012-06-24 NOTE — Discharge Summary (Signed)
  Discharge summary job # [id number]

## 2012-06-25 ENCOUNTER — Inpatient Hospital Stay (HOSPITAL_COMMUNITY): Payer: Medicare Other | Admitting: Speech Pathology

## 2012-06-25 ENCOUNTER — Inpatient Hospital Stay (HOSPITAL_COMMUNITY): Payer: Medicare Other

## 2012-06-25 ENCOUNTER — Inpatient Hospital Stay (HOSPITAL_COMMUNITY): Payer: Medicare Other | Admitting: Occupational Therapy

## 2012-06-25 DIAGNOSIS — I6330 Cerebral infarction due to thrombosis of unspecified cerebral artery: Secondary | ICD-10-CM

## 2012-06-25 DIAGNOSIS — N390 Urinary tract infection, site not specified: Secondary | ICD-10-CM

## 2012-06-25 DIAGNOSIS — R209 Unspecified disturbances of skin sensation: Secondary | ICD-10-CM

## 2012-06-25 DIAGNOSIS — I69998 Other sequelae following unspecified cerebrovascular disease: Secondary | ICD-10-CM

## 2012-06-25 DIAGNOSIS — G8110 Spastic hemiplegia affecting unspecified side: Secondary | ICD-10-CM

## 2012-06-25 LAB — GLUCOSE, CAPILLARY: Glucose-Capillary: 100 mg/dL — ABNORMAL HIGH (ref 70–99)

## 2012-06-25 MED ORDER — ASPIRIN 81 MG PO CHEW: 81.0000 mg | CHEWABLE_TABLET | Freq: Every day | ORAL | Status: DC

## 2012-06-25 MED ORDER — QUETIAPINE FUMARATE 25 MG PO TABS: 25.0000 mg | ORAL_TABLET | Freq: Two times a day (BID) | ORAL | Status: DC

## 2012-06-25 MED ORDER — NITROFURANTOIN MONOHYD MACRO 100 MG PO CAPS: 100.0000 mg | ORAL_CAPSULE | Freq: Two times a day (BID) | ORAL | Status: DC

## 2012-06-25 MED ORDER — CLOPIDOGREL BISULFATE 75 MG PO TABS: 75.0000 mg | ORAL_TABLET | Freq: Every day | ORAL | Status: DC

## 2012-06-25 MED ORDER — ATORVASTATIN CALCIUM 40 MG PO TABS: 40.0000 mg | ORAL_TABLET | Freq: Every day | ORAL | Status: DC

## 2012-06-25 MED ORDER — DONEPEZIL HCL 5 MG PO TABS: 5.0000 mg | ORAL_TABLET | Freq: Every day | ORAL | Status: DC

## 2012-06-25 NOTE — Progress Notes (Signed)
Occupational Therapy Discharge Summary  Patient Details  Name: Raymond Rangel MRN: [id number] Date of Birth: 1931-08-23  Today's Date: 06/25/2012    Patient has met 8 of 8 long term goals due to improved activity tolerance, improved balance, postural control, ability to compensate for deficits, functional use of  RIGHT upper extremity, improved attention, improved awareness and improved coordination.  Patient to discharge at overall Mod Assist level.  Patient's care partner is independent to provide the necessary physical and cognitive assistance at discharge.    Reasons goals not met: n/a  Recommendation:  Patient will benefit from ongoing skilled OT services in home health setting to continue to advance functional skills in the area of BADL.  Equipment: No equipment provided  Reasons for discharge: treatment goals met  Patient/family agrees with progress made and goals achieved: Yes  OT Discharge ADL  overall mod assist - refer to FIM for details Vision/Perception  Vision - History Patient Visual Report: Blurring of vision;Diplopia Vision - Assessment Vision Assessment: Vision impaired - to be further tested in functional context Perception Perception: Impaired Inattention/Neglect: Does not attend to right side of body;Does not attend to right visual field Praxis Praxis: Impaired Praxis Impairment Details: Motor planning;Initiation  Cognition Overall Cognitive Status: Impaired at baseline Orientation Level: Oriented to person;Disoriented to place;Disoriented to time;Disoriented to situation Sensation Sensation Light Touch: Impaired by gross assessment Stereognosis: Impaired by gross assessment Hot/Cold: Appears Intact Proprioception: Impaired by gross assessment Coordination Gross Motor Movements are Fluid and Coordinated: No Fine Motor Movements are Fluid and Coordinated: No Coordination and Movement Description: pt attempts to move RUE spontaneously (not on command)  but movements are ataxic Motor  Motor Motor: Hemiplegia;Ataxia;Motor apraxia;Abnormal tone;Abnormal postural alignment and control Mobility    Refer to FIM Trunk/Postural Assessment  Cervical Assessment Cervical Assessment: Within Functional Limits Thoracic Assessment Thoracic Assessment: Within Functional Limits Lumbar Assessment Lumbar Assessment: Exceptions to [facility] Postural Control Protective Responses: delayed and inadequate Postural Limitations: leans L and posteriorly in sitting; RLe rigid in sitting and supine  Balance Static Standing Balance Static Standing - Balance Support: Bilateral upper extremity supported Static Standing - Level of Assistance: 3: Mod assist Static Standing - Comment/# of Minutes: standing at sink Extremity/Trunk Assessment RUE Tone RUE Tone: Moderate;Hypertonic LUE AROM (degrees) Left Shoulder Flexion: 90 Degrees  See FIM for current functional status  Elic Vencill 06/25/2012, 11:26 AM

## 2012-06-25 NOTE — Progress Notes (Addendum)
Physical Therapy Discharge Summary  Patient Details  Name: Raymond Rangel MRN: [id number] Date of Birth: [birth year]  Today's Date: 06/25/2012 Time: 1100-1200 Time Calculation (min): 60 min  Patient has met 4 of 5 long term goals due to improved postural control, increased strength, functional use of  right upper extremity and right lower extremity, improved attention and improved awareness.  Patient to discharge at a wheelchair level Max Assist.   Patient's care partner, daughter Lona Millard is independent to provide the necessary physical assistance at discharge.  Reasons goals not met: visual/perceptual deficits impact pt's ability to transfer consistently with mod assist.  Recommendation:  Patient will benefit from ongoing skilled PT services in home health setting to continue to advance safe functional mobility, address ongoing impairments in midline orientation, balance, motor control, RLE hypertonus with + support reaction,  and minimize fall risk.  Pt is able to follow 1 step commands for functional, familiar tasks.  Pt is cooperative, but is not able to learn new information due to dementia.  Functional mobility training is effective when pt is feeling well, and automatic movements are emphasized.   Equipment: rental w/c with [employer], [employer] with 1/4" inch wedge, 30" SB  Reasons for discharge: discharge from hospital  Patient/family agrees with progress made and goals achieved: Yes  PT Discharge Precautions/Restrictions Precautions Precautions: Fall Precaution Comments: increased tone on right side (extensor in leg and flexor in arm), decreased midline orientation, dementia Restrictions Weight Bearing Restrictions: No   Pain Pain Assessment Pain Assessment: No/denies pain Vision/Perception  Vision - History Patient Visual Report: Blurring of vision;Diplopia Vision - Assessment Vision Assessment: Vision impaired - to be further tested in functional  context Perception Perception: Impaired Inattention/Neglect: Does not attend to right visual field;Does not attend to right side of body Praxis Praxis: Impaired Praxis Impairment Details: Motor planning;Initiation  Cognition Overall Cognitive Status: Impaired at baseline Arousal/Alertness: Awake/alert Orientation Level: Oriented to person;Disoriented to place;Disoriented to time;Disoriented to situation Attention: Focused Sensation Sensation Light Touch: Appears Intact Stereognosis: Impaired by gross assessment Hot/Cold: Appears Intact Proprioception: Not tested (unable to assess due to dementia) Coordination Gross Motor Movements are Fluid and Coordinated: No Fine Motor Movements are Fluid and Coordinated: No Coordination and Movement Description: poor motor control RLE Motor  Motor Motor: Hemiplegia;Ataxia;Motor apraxia;Abnormal tone;Abnormal postural alignment and control Motor - Discharge Observations: pt able to move RLE in/out of synergy better than at admission, but awareness of RLE position remains impaired  Mobility Bed Mobility Bed Mobility: Rolling Right;Rolling Left;Right Sidelying to Sit;Left Sidelying to Sit;Supine to Sit;Sit to Supine Rolling Right: 5: Supervision Rolling Left: 5: Supervision Rolling Left Details (indicate cue type and reason): VCS for initiation Right Sidelying to Sit: 5: Supervision Left Sidelying to Sit: 5: Supervision Sit to Supine: 4: Min guard Sit to Supine: Patient Percentage: 90% Scooting to HOB: 2: Max assist Transfers Sit to Stand: 3: Mod assist Sit to Stand Details: Tactile cues for weight shifting Stand to Sit: 3: Mod assist Stand Pivot Transfers: 2: Max assist;With armrests Stand Pivot Transfer Details: Tactile cues for initiation;Tactile cues for sequencing;Tactile cues for weight shifting Locomotion  Ambulation Ambulation: Yes Ambulation/Gait Assistance: 4: Min guard Ambulation Distance (Feet): 30 Feet Assistive device:  Other (Comment) (rail in hall) Stairs / Additional Locomotion Stairs: Yes Stairs Assistance: 1: +2 Total assist Stair Management Technique: One rail Right Number of Stairs: 3 Height of Stairs: 7 Wheelchair Mobility Wheelchair Mobility: Yes Wheelchair Assistance: 4: Min Armed forces logistics/support/administrative officer Propulsion: Left lower extremity;Left upper  extremity Wheelchair Parts Management: Needs assistance Distance: 20  Trunk/Postural Assessment  Cervical Assessment Cervical Assessment: Within Functional Limits Thoracic Assessment Thoracic Assessment: Within Functional Limits Lumbar Assessment Lumbar Assessment: Within Functional Limits Postural Control Postural Control: Deficits on evaluation Protective Responses: delayed and inadequate Postural Limitations: at times leans L and posteriorly in standing  Balance Balance Balance Assessed: Yes Static Sitting Balance Static Sitting - Balance Support: Feet supported;No upper extremity supported Static Sitting - Level of Assistance: 5: Stand by assistance Static Standing Balance Static Standing - Balance Support: Left upper extremity supported Static Standing - Level of Assistance: 3: Mod assist Static Standing - Comment/# of Minutes: standing at sink Extremity Assessment  RUE Tone RUE Tone: Moderate;Hypertonic LUE AROM (degrees) Left Shoulder Flexion: 90 Degrees RLE Assessment RLE Assessment: Exceptions to [facility] RLE Strength RLE Overall Strength Comments: grossly by MMT: hip flex 4/5; knee ext 5/5, ankle df4/5 RLE Tone RLE Tone Comments: extensor synergy often when requested to move RLE, but able to move in/out of synergy on command with repetition LLE Assessment LLE Assessment: Within Functional Limits (grossly 5/5)  See FIM for current functional status  Treatment today:  Pt alert and conversational today.    neuromuscular re-education via tactile cues, VCs, forced use for:  RLE wt bearing, swing phase components of gait, wearing RAFO, x  30' with min guard assist using railing in hall on L.  Continuing family ed with daughter [name].  Pt's son will also assist at home with car and basic transfers.  Simulated car transfers with mod assist; pt uses L hand to pull up on door edge during stand pivot car transfer.  Bed mobility with min assist  and basic transfers with mod> max assist, all with daughter [name].  Therapist requested brake extensions because daughter has RA bil hands; [facility] will deliver to home.  Jontae Sonier 06/25/2012, 12:40 PM

## 2012-06-25 NOTE — Progress Notes (Signed)
Social Work Discharge Note Discharge Note  The overall goal for the admission was met for:   Discharge location: Yes-HOME WITH 24 HR CARE PER FAMILY  Length of Stay: Yes-26 DAYS  Discharge activity level: Yes-MIN/MOD LEVEL  Home/community participation: Yes  Services provided included: MD, RD, PT, OT, SLP, RN, Pharmacy and SW  Financial Services: Private Insurance: [employer]=MEDICARE  Follow-up services arranged: Home Health: [facility]-PT,OT,RN, AIDE, SW, DME: [facility]-WHEELCHAIR,30 TRANSFER BOAD, HOSPTIAL BED, and Patient/Family request agency [facility]: [facility], DME: [facility]  Comments (or additional information):HOME EVAL DONE ON FRI 3/14, WENT WELL.  ALL FEEL PREPARED FOR D/C TODAY.  AWARE PT REQUIRES 24 HR PHYSICAL CARE  Patient/Family verbalized understanding of follow-up arrangements: Yes  Individual responsible for coordination of the follow-up plan: [facility] AND [name]-PT'S WIFE  Confirmed correct DME delivered: Lucy Chris 06/25/2012    Lucy Chris

## 2012-06-25 NOTE — Progress Notes (Signed)
Speech Language Pathology Daily Session Note & Discharge Summary  Patient Details  Name: Raymond Rangel MRN: [id number] Date of Birth: [birth year]  Today's Date: 06/25/2012 Time: 1478-2956 Time Calculation (min): 40 min  Short Term Goals: Week 4: SLP Short Term Goal 1 (Week 4): Patient will follow 1 step directions during functional, familiar tasks with mod assist verbal and visual cues. SLP Short Term Goal 2 (Week 4): Patient will demonstrate basic problem solving during functional, familiar tasks with mod assist verbal and visual cues. SLP Short Term Goal 3 (Week 4): Patient will demonstrate basic problem solving during functional, familiar tasks with mod assist verbal and visual cues. SLP Short Term Goal 4 (Week 4): Family will demonstrate 2 cuing strategies to utilize with patient durig completion of basic self care tasks.  Skilled Therapeutic Interventions: Treatment focus on cognitive-linguistic goals during functional tasks. Patient required max assist to follow 1-step directions and basic problem solving with wheelchair management.  SLP also facilitated session with max assist for orientation with patient stating "I am not sure what today is but I think I am going home." Patient recalling and stating family member names with max faded to mod assist phonemic cues.    FIM:  Comprehension Comprehension Mode: Auditory Comprehension: 2-Understands basic 25 - 49% of the time/requires cueing 51 - 75% of the time Expression Expression Mode: Verbal Expression: 3-Expresses basic 50 - 74% of the time/requires cueing 25 - 50% of the time. Needs to repeat parts of sentences. Social Interaction Social Interaction: 2-Interacts appropriately 25 - 49% of time - Needs frequent redirection. Problem Solving Problem Solving: 2-Solves basic 25 - 49% of the time - needs direction more than half the time to initiate, plan or complete simple activities Memory Memory: 1-Recognizes or recalls less than 25% of  the time/requires cueing greater than 75% of the time  Pain Pain Assessment Pain Assessment: No/denies pain  Therapy/Group: Individual Therapy   Speech Language Pathology Discharge Summary  Patient Details  Name: Raymond Rangel MRN: [id number] Date of Birth: 12/03/1931  Today's Date: 06/25/2012  Patient has met 2 of 6 long term goals.  Patient to discharge at overall Min;Mod;Max level.  Reasons goals not met: inconsistent performance   Clinical Impression/Discharge Summary: Patient met 2 out of 6 long term goals during CIR stay due to gains in swallow function and diet toleration.  Fluctuating cognition impacts patient's performance, he can range from min to max assit with communcation and functional abilities.  Family educaiton has been completed and they have demonstrated the ability to care for him desipte his fluctuating cognition.  As a result, it is recommeded that this patient recieve skilled SLP services at home to [facility] functional independence in a familiar environment, diet advancement and reduce family's burden of care.   Care Partner:  Caregiver Able to Provide Assistance: Yes  Type of Caregiver Assistance: Physical;Cognitive  Recommendation:  Home Health SLP;24 hour supervision/assistance  Rationale for SLP Follow Up: Maximize cognitive function and independence;Maximize swallowing safety;Reduce caregiver burden   Equipment: none   Reasons for discharge: Discharged from hospital   Patient/Family Agrees with Progress Made and Goals Achieved: Yes   See FIM for current functional status  Charlane Ferretti., CCC-SLP [phone]  Colonel Krauser 06/25/2012, 4:47 PM

## 2012-06-25 NOTE — Plan of Care (Signed)
Problem: RH BOWEL ELIMINATION Goal: RH STG MANAGE BOWEL WITH ASSISTANCE STG Manage Bowel with max assist timed toilet (prn meds only)  Outcome: Not Met (add Reason) Unsuccessful with timed toileting.  Patient continues to have incontinent episodes.  Problem: RH BLADDER ELIMINATION Goal: RH STG MANAGE BLADDER WITH ASSISTANCE STG Manage Bladder With max assist timed toileting  Outcome: Not Met (add Reason) Unsuccessful with timed toileting, patient continues to have incontinent episodes

## 2012-06-25 NOTE — Progress Notes (Signed)
Patient discharge to home at 1315, with daughter and son.  Discharge instruction given by Harvel Ricks, PA, daughter verbalize understanding, no further questions.  Belonging packed and escorted off by NT.

## 2012-06-25 NOTE — Progress Notes (Signed)
Occupational Therapy Session Note  Patient Details  Name: Raymond Rangel MRN: [id number] Date of Birth: 1932/03/11  Today's Date: 06/25/2012 Time: 1015-1100 Time Calculation (min): 45 min  Short Term Goals: Week 1:  OT Short Term Goal 1 (Week 1): Pt will roll in bed with mod assist to assist caregivers with clothing changes. OT Short Term Goal 1 - Progress (Week 1): Progressing toward goal OT Short Term Goal 2 (Week 1): Pt will sit on EOB with mod assist to engage in grooming tasks. OT Short Term Goal 2 - Progress (Week 1): Met OT Short Term Goal 3 (Week 1): Pt will don shirt with max assist. OT Short Term Goal 3 - Progress (Week 1): Met Week 2:  OT Short Term Goal 1 (Week 2): Pt will consistently transfer to the toilet using a squat pivot transfer with max assist x1. OT Short Term Goal 1 - Progress (Week 2): Met OT Short Term Goal 2 (Week 2): Pt will sit to stand from toilet and maintain standing with mod assist. OT Short Term Goal 2 - Progress (Week 2): Progressing toward goal OT Short Term Goal 3 (Week 2): Pt will demonstrate improved RUE motor control by bathing left arm with min assist. OT Short Term Goal 3 - Progress (Week 2): Met Week 3:  OT Short Term Goal 1 (Week 3): Pt will transfer to tub bench in tub with mod to max assist. OT Short Term Goal 1 - Progress (Week 3): Met OT Short Term Goal 2 (Week 3): Pt will transfer to the toilet with his daughter assisting him with mod to max assist. OT Short Term Goal 2 - Progress (Week 3): Progressing toward goal OT Short Term Goal 4 (Week 3): Pt will stand at toilet with mod assist so his daughter can assist him with clothing management. OT Short Term Goal 4 - Progress (Week 3): Progressing toward goal Week 4:  OT Short Term Goal 1 (Week 4): Pt will transfer to the [facility] with his daughter assisting him with mod to max assist. OT Short Term Goal 2 (Week 4): Pt will stand up holding onto bed rail with mod assist so that his daughter can assist  him with clothing management.  Skilled Therapeutic Interventions/Progress Updates:      Pt seen for BADL retraining of toileting, bathing at shower level, and dressing with a focus on family education with patient's daughter. Pt performed all skills extremely well this morning.  He was able to transfer on and off toilet and in and out of the shower with mod assist x1. In the shower he bathed self with mod assist, donned shirt with min to mod assist and was able to pull his pants up.  He stood to sink with only min assist and was able to maintain balance at sink with min to mod assist. Pt was able to follow directions well and exhibited more LE motor control.  Daughter was pleased with his progressed.  Pt will be d/c to home today.  Therapy Documentation Precautions:  Precautions Precautions: Fall Precaution Comments: increased tone on right side (extensor in leg and flexor in arm), decreased midline orientation, dementia Restrictions Weight Bearing Restrictions: No   Pain: Pain Assessment Pain Assessment: No/denies pain ADL:  See FIM for current functional status  Therapy/Group: Individual Therapy  Kyion Gautier 06/25/2012, 11:34 AM

## 2012-06-25 NOTE — Progress Notes (Addendum)
Patient ID: Raymond Rangel, male   DOB: 1931/07/04, 79 y.o.   MRN: [id number] P Subjective/Complaints: 79 y.o. right-handed male history of TIA, diabetes mellitus and dementia. 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 Admitted 05/26/2012 with right-sided weakness and slurred speech. MRI shows acute left PCA territory infarcts without hemorrhage. MRA of the head with 50% mid basilar stenosis. Proximal occlusion left PCA. Echocardiogram with ejection fraction 60% and grade 1 diastolic dysfunction. Carotid Dopplers with no ICA stenosis. Patient did not receive TPA. Neurology consulted patient maintained on aspirin 81 mg daily as prior to admission and Plavix added to regimen. Subcutaneous Lovenox for DVT prophylaxis. Currently patient is maintained on a dysphagia 1 thin liquid diet after modified barium swallow study 05/28/2012.Monitoring of cognition with noted history of dementia placed on Seroquel.  More alert, unaware of D/C today. Still has tone with the plantar flexors and toe flexors in the right lower extremity as well as the elbow flexors finger and wrist flexors in the right upper ext.   Review of Systems  Review of Systems  Constitutional: Negative.   Respiratory: Positive for cough. Negative for hemoptysis, sputum production, shortness of breath and wheezing.   Gastrointestinal: Negative.   Neurological: Positive for focal weakness.  All other systems reviewed and are negative.    Objective: Vital Signs: Blood pressure 127/79, pulse 65, temperature 98 F (36.7 C), temperature source Oral, resp. rate 19, height 5\' 6"  (1.676 m), weight 62 kg (136 lb 11 oz), SpO2 98.00%. Dg Swallowing Func-speech Pathology  05/29/2012  [name], CCC-SLP     05/29/2012 11:55 AM   Recommend  to proceed with dysphagia 1 (puree) and thin liquids with full  assist with each meals to ensure safety.  Recommend puree mainly  due to severity of oral dysphagia.  Please note patient  required  hand over hand assist for self feeding with majority of PO trials  administered by treating SLP.  Only trace silent aspiration  occurred with nectar thick liquids and x1 with thin liquid but  patient was in optimal upright positioning.       Results for orders placed during the hospital encounter of 05/30/12 (from the past 72 hour(s))  GLUCOSE, CAPILLARY     Status: Abnormal   Collection Time    06/22/12  5:54 PM      Result Value Range   Glucose-Capillary 113 (*) 70 - 99 mg/dL   Comment 1 Notify RN    GLUCOSE, CAPILLARY     Status: None   Collection Time    06/22/12  9:02 PM      Result Value Range   Glucose-Capillary 90  70 - 99 mg/dL  GLUCOSE, CAPILLARY     Status: Abnormal   Collection Time    06/23/12  7:38 AM      Result Value Range   Glucose-Capillary 103 (*) 70 - 99 mg/dL   Comment 1 Notify RN    GLUCOSE, CAPILLARY     Status: Abnormal   Collection Time    06/23/12 11:39 AM      Result Value Range   Glucose-Capillary 108 (*) 70 - 99 mg/dL   Comment 1 Notify RN    GLUCOSE, CAPILLARY     Status: None   Collection Time    06/23/12  4:51 PM      Result Value Range   Glucose-Capillary 77  70 - 99 mg/dL   Comment 1 Notify RN    GLUCOSE,  CAPILLARY     Status: Abnormal   Collection Time    06/23/12  9:29 PM      Result Value Range   Glucose-Capillary 125 (*) 70 - 99 mg/dL   Comment 1 Notify RN    GLUCOSE, CAPILLARY     Status: None   Collection Time    06/24/12  7:15 AM      Result Value Range   Glucose-Capillary 95  70 - 99 mg/dL  GLUCOSE, CAPILLARY     Status: Abnormal   Collection Time    06/24/12 11:35 AM      Result Value Range   Glucose-Capillary 112 (*) 70 - 99 mg/dL  GLUCOSE, CAPILLARY     Status: Abnormal   Collection Time    06/24/12  4:30 PM      Result Value Range   Glucose-Capillary 100 (*) 70 - 99 mg/dL  GLUCOSE, CAPILLARY     Status: Abnormal   Collection Time    06/24/12  9:02 PM      Result Value Range   Glucose-Capillary 113 (*) 70  - 99 mg/dL  GLUCOSE, CAPILLARY     Status: Abnormal   Collection Time    06/25/12  7:11 AM      Result Value Range   Glucose-Capillary 100 (*) 70 - 99 mg/dL   Comment 1 Notify RN       HEENT: normal Cardio: RRR Resp: CTA B/L GI: BS positive and Distention Extremity:  Pulses positive and No Edema Skin:   Intact Neuro: Alert/Oriented, Confused but   attentive today. Doesn't realize he's in the hospital or what has happened to him. Flat, Cranial Nerve II-XII normal, Abnormal Sensory Reduced sensation on Right but also has poor attention to testing, Abnormal Motor 2-/5 in RUE and 3-/5 RLE, 4/5 LUE, 3-/5 LLE and Tone:  increased flexor tone RUE, increased extensor tone RLE Musc/Skel:  Other Old scar L antecubital fossa, non tender Gen:  NAD Cognitive: oriented to person and to hospital but not date Righ homonymous hemianopsia persistent  Assessment/Plan: 1. Functional deficits secondary to Left PCA infarct Stable for D/C today F/u PCP in 1-2 weeks F/u PM&R 3 weeks See D/C summary See D/C instructionsFIM: FIM - Bathing Bathing Steps Patient Completed: Chest;Right Arm;Abdomen;Right upper leg;Left upper leg Bathing: 2: Max-Patient completes 3-4 30f 10 parts or 25-49%  FIM - Upper Body Dressing/Undressing Upper body dressing/undressing steps patient completed: Thread/unthread left sleeve of pullover shirt/dress;Put head through opening of pull over shirt/dress Upper body dressing/undressing: 2: Max-Patient completed 25-49% of tasks FIM - Lower Body Dressing/Undressing Lower body dressing/undressing steps patient completed: Thread/unthread left pants leg Lower body dressing/undressing: 2: Max-Patient completed 25-49% of tasks  FIM - Toileting Toileting: 1: Two helpers  FIM - Diplomatic Services operational officer Devices: Therapist, music Transfers: 1-Two helpers  FIM - Banker Devices: Bed rails Bed/Chair Transfer: 4: Sit > Supine: Min  A (steadying pt. > 75%/lift 1 leg);2: Bed > Chair or W/C: Max A (lift and lower assist)  FIM - Locomotion: Wheelchair Distance: 20 Locomotion: Wheelchair: 1: Travels less than 50 ft with moderate assistance (Pt: 50 - 74%) FIM - Locomotion: Ambulation Locomotion: Ambulation Assistive Devices: Orthosis Ambulation/Gait Assistance: 3: Mod assist Locomotion: Ambulation: 0: Activity did not occur  Comprehension Comprehension Mode: Auditory Comprehension: 2-Understands basic 25 - 49% of the time/requires cueing 51 - 75% of the time  Expression Expression Mode: Verbal Expression: 2-Expresses basic 25 - 49% of the time/requires cueing  50 - 75% of the time. Uses single words/gestures.  Social Interaction Social Interaction: 2-Interacts appropriately 25 - 49% of time - Needs frequent redirection.  Problem Solving Problem Solving Mode: Not assessed Problem Solving: 1-Solves basic less than 25% of the time - needs direction nearly all the time or does not effectively solve problems and may need a restraint for safety  Memory Memory Mode: Not assessed Memory: 1-Recognizes or recalls less than 25% of the time/requires cueing greater than 75% of the time Medical Problem List and Plan:  1. embolic left PCA infarct  2. DVT Prophylaxis/Anticoagulation: Subcutaneous Lovenox. Monitor platelet counts and any signs of bleeding Hx of Left femoral L popliteal,Left tibial subacute DVT diagnosed January 2013.D/C lovenox  3. Mood/dementia. Seroquel 25 mg twice a day, Ativan when necessary. Patient displayed baseline cognitive deficits per family. Will expect wax and wane.Trial Aricept- insomnia related to Aricept improved after a couple days will monitor 4. Neuropsych: This patient is not capable of making decisions on his/her own behalf.  5. Hyperlipidemia. Zocor  6. Diabetes mellitus with peripheral neuropathy. Hemoglobin A1c 6.7. Patient on diet control prior to admission. Check CBGs a.c. and at bedtime-  fair control generally. Cover spikes with SSI for now. 7. Dysphagia. Dysphagia 1 thin liquids. Speech therapy followup. Monitor for any signs of aspiration.  8. UTI recurrent now on nitrofurantoin 9.  Constipation nsg to give sorbitol 10.  Spasticity tone improved on Zanaflex and  baclofen,  Both caused sedation, may need botox as outpt  LOS (Days) 26 A FACE TO FACE EVALUATION WAS PERFORMED  KIRSTEINS,ANDREW E 06/25/2012, 8:43 AM

## 2012-06-27 ENCOUNTER — Telehealth: Payer: Self-pay | Admitting: Internal Medicine

## 2012-06-27 MED ORDER — GLUCOSE BLOOD VI STRP: ORAL_STRIP | Status: DC

## 2012-06-27 MED ORDER — ONETOUCH LANCETS MISC: Status: DC

## 2012-06-27 NOTE — Telephone Encounter (Signed)
Spoke with pt's daughter, she is requesting a glucometer. Pt's daughter is going to come by the office to pick one up.

## 2012-06-27 NOTE — Telephone Encounter (Signed)
Pt's daughter called and needs to talk to someone about dad's blood sugars and how to check those each day. The hospital asked him to check it three to four times a day and they need advice on what to do.

## 2012-07-02 ENCOUNTER — Ambulatory Visit (INDEPENDENT_AMBULATORY_CARE_PROVIDER_SITE_OTHER): Payer: Medicare Other | Admitting: Internal Medicine

## 2012-07-02 ENCOUNTER — Encounter: Payer: Self-pay | Admitting: Internal Medicine

## 2012-07-02 VITALS — BP 94/68 | HR 84 | Temp 98.1°F

## 2012-07-02 DIAGNOSIS — E785 Hyperlipidemia, unspecified: Secondary | ICD-10-CM

## 2012-07-02 DIAGNOSIS — F0390 Unspecified dementia without behavioral disturbance: Secondary | ICD-10-CM

## 2012-07-02 DIAGNOSIS — I639 Cerebral infarction, unspecified: Secondary | ICD-10-CM

## 2012-07-02 DIAGNOSIS — I6350 Cerebral infarction due to unspecified occlusion or stenosis of unspecified cerebral artery: Secondary | ICD-10-CM

## 2012-07-02 DIAGNOSIS — E119 Type 2 diabetes mellitus without complications: Secondary | ICD-10-CM

## 2012-07-02 DIAGNOSIS — N390 Urinary tract infection, site not specified: Secondary | ICD-10-CM

## 2012-07-02 LAB — LIPID PANEL
Cholesterol: 158 mg/dL (ref 0–200)
Triglycerides: 119 mg/dL (ref 0.0–149.0)
VLDL: 23.8 mg/dL (ref 0.0–40.0)

## 2012-07-02 LAB — AST: AST: 17 U/L (ref 0–37)

## 2012-07-02 MED ORDER — CLOPIDOGREL BISULFATE 75 MG PO TABS: 75.0000 mg | ORAL_TABLET | Freq: Every day | ORAL | Status: DC

## 2012-07-02 NOTE — Progress Notes (Signed)
  Subjective:    Patient ID: Raymond Rangel, male    DOB: 05/13/1931, 79 y.o.   MRN: [id number]  HPI Followup. The patient was admitted to the hospital w/ a CVA (05/26/2012 ), no tPA, followup by stay at the rehabilitation unit. He was at the rehabilitation unit from 05/30/2012 to 06/25/2012 Discharge diagnosis are as follows.  1. Embolic left PCA infarct.  2. Subcutaneous Lovenox for DVT prophylaxis.  3. Dementia.  4. Hyperlipidemia.  5. Diabetes mellitus.  6. Dysphagia.  7. E coli urinary tract infection.  Prior to be  Discharge home, he made some progress. Status post treatment for a UTI. Tolerating a puree diet. Labs and x-rays reviewed. --MRI showed left PCA territory infarct without hemorrhage.  --MRA of the head with 50% mid basilar stenosis. Proximal occlusion left PCA.  --Echocardiogram with ejection fraction 60% and grade 1 diastolic  dysfunction.  --Carotid Doppler with no ICA stenosis.  Last BMP normal Upon admission, LDL is 148, LFTs were normal, there was no anemia. A1c was 6.7. Urine culture showed Escherichia coli 06/17/2012. Status post Macrobid.   PMH  Coronary artery disease, stent in 2005  Hyperlipidemia  Diabetes  "Irregular heartbeat"  Bacteremia 10/2010  Dementia  History of mini strokes  DJD  H/o ETOH  DVT left leg 04/2011  Stroke 2-14  Past Surgical History  Procedure Laterality Date  . Left arm    . Rotator cuff repair  2000(L) 1999 (R)    PSH  Married, retired, 3 daughters and one son  Lives w/wife.  Former alcohol abuse  Quit tobacco in the 71s  Wife reports today she knows in the past the patient abused Rx Medications    Review of Systems Since he left home he is doing okay. Depends on his family for his activities of daily living, he is able to feed himself. Denies any cough after eating. He has been constipated for 4 days, denies nausea, vomiting, blood in the stools. Appetite is okay. Wife reports he is not drinking as much  fluids as he should. Has a history of recent UTI, currently without fever, chills, dysuria. His urine however looked "dark" yesterday. No ambulatory blood sugars mostly due to to cost of the strips. No ambulatory BPs. Asked about depression, patient denies any blue or sad feelings,   family reports he is somehow withdrawn. Good compliance with medications except Plavix, needs a prescription.     Objective:   Physical Exam General -- alert, well-developed, no apparent distress     Lungs -- normal respiratory effort, no intercostal retractions, no accessory muscle use, and normal breath sounds.   Heart-- normal rate, regular rhythm, no murmur, and no gallop.   Abdomen--exam is performed with the patient sitting in a wheelchair, abdomen is soft, non-tender, no distention, no masses.  Extremities-- no pretibial edema bilaterally Neurologic-- alert , no apparent distress, mild right-sided weakness, most noticeable in the right arm. Psych-- affect is flat but he is willing to answer questions. not anxious appearing and not depressed appearing.       Assessment & Plan:  Constipation, see instructions.

## 2012-07-02 NOTE — Assessment & Plan Note (Signed)
Dementia, continue with Seroquel and Aricept. He probably has some degree of depression, call recheck on this issue in 2 or 3 months when he comes back.

## 2012-07-02 NOTE — Assessment & Plan Note (Signed)
Hyperlipidemia, LDL at the time of the stroke was 148, apparently he was not taking medications before the stroke, currently on Lipitor. Although he is not fasting, will check  a lipid panel and LFTs.

## 2012-07-02 NOTE — Assessment & Plan Note (Signed)
UTI, urine culture positive for 06/17/2012, recheck a urine culture, treat if appropriate.

## 2012-07-02 NOTE — Assessment & Plan Note (Signed)
Stroke Recovering from a stroke, status post extensive physical therapy. Tolerating feedings well, encourage drink plenty of liquids. Refill Plavix, continue with aspirin, and controlling risk factors.

## 2012-07-02 NOTE — Progress Notes (Signed)
Patient could not use the bathroom sent cup home with patient.Raymond Rangel

## 2012-07-02 NOTE — Patient Instructions (Addendum)
Constipation: Glycerin suppositories today, repeat tomorrow if no results. Start MiraLax 17 g OTC, take it with fluids every day. If constipation persists and he has fever, chills, stomach pain or nausea ----- go to the ER. ----- Next visit in 2 months, please make an appointment.

## 2012-07-02 NOTE — Assessment & Plan Note (Signed)
Diabetes, A1c 6.7, no change.

## 2012-07-05 ENCOUNTER — Other Ambulatory Visit: Payer: Self-pay | Admitting: *Deleted

## 2012-07-05 DIAGNOSIS — I25810 Atherosclerosis of coronary artery bypass graft(s) without angina pectoris: Secondary | ICD-10-CM

## 2012-07-05 LAB — URINALYSIS, ROUTINE W REFLEX MICROSCOPIC
Bilirubin Urine: NEGATIVE
Hgb urine dipstick: NEGATIVE
Ketones, ur: NEGATIVE
Nitrite: NEGATIVE
Specific Gravity, Urine: 1.02 (ref 1.000–1.030)
Total Protein, Urine: 30
Urine Glucose: NEGATIVE
Urobilinogen, UA: 0.2 (ref 0.0–1.0)
pH: 7 (ref 5.0–8.0)

## 2012-07-05 MED ORDER — CLOPIDOGREL BISULFATE 75 MG PO TABS: 75.0000 mg | ORAL_TABLET | Freq: Every day | ORAL | Status: DC

## 2012-07-05 NOTE — Telephone Encounter (Signed)
Refill for plavix sent to [facility] pharmacy on [address] per wifes request

## 2012-07-06 ENCOUNTER — Encounter: Payer: Self-pay | Admitting: Physical Medicine & Rehabilitation

## 2012-07-06 ENCOUNTER — Ambulatory Visit (HOSPITAL_BASED_OUTPATIENT_CLINIC_OR_DEPARTMENT_OTHER): Payer: Medicare Other | Admitting: Physical Medicine & Rehabilitation

## 2012-07-06 ENCOUNTER — Encounter: Payer: Medicare Other | Attending: Physical Medicine & Rehabilitation

## 2012-07-06 VITALS — BP 109/55 | HR 72 | Resp 14 | Ht 66.0 in | Wt 136.0 lb

## 2012-07-06 DIAGNOSIS — E7800 Pure hypercholesterolemia, unspecified: Secondary | ICD-10-CM | POA: Insufficient documentation

## 2012-07-06 DIAGNOSIS — Z86718 Personal history of other venous thrombosis and embolism: Secondary | ICD-10-CM | POA: Insufficient documentation

## 2012-07-06 DIAGNOSIS — I69993 Ataxia following unspecified cerebrovascular disease: Secondary | ICD-10-CM | POA: Insufficient documentation

## 2012-07-06 DIAGNOSIS — F0151 Vascular dementia with behavioral disturbance: Secondary | ICD-10-CM | POA: Insufficient documentation

## 2012-07-06 DIAGNOSIS — F0150 Vascular dementia without behavioral disturbance: Secondary | ICD-10-CM

## 2012-07-06 DIAGNOSIS — E119 Type 2 diabetes mellitus without complications: Secondary | ICD-10-CM | POA: Insufficient documentation

## 2012-07-06 DIAGNOSIS — F0390 Unspecified dementia without behavioral disturbance: Secondary | ICD-10-CM | POA: Insufficient documentation

## 2012-07-06 NOTE — Progress Notes (Signed)
Subjective:    Patient ID: Raymond Rangel, male    DOB: [birth year], 79 y.o.   MRN: [id number] This is a 79 year old right-handed male  with history of diabetes mellitus dementia who lives with his wife and  attends adult daycare for senior citizens. Admitted May 26, 2012  with right-sided weakness and slurred speech. MRI showed left PCA  territory infarct without hemorrhage. MRA of the head with 50% mid  basilar stenosis. Proximal occlusion left PCA.  Echocardiogram with ejection fraction 60% and grade 1 diastolic  dysfunction. Carotid Doppler with no ICA stenosis. The patient did not  receive tPA. Neurology Service was consulted, maintained on aspirin  therapy as well as Plavix added to regimen. Subcutaneous Lovenox for  DVT prophylaxis. The patient was maintained on dysphagia one thin  liquid diet per modified barium swallow. Monitoring of cognition and  noted history of dementia, maintained on Seroquel  Wife is doing total care for pt Unable to continue. HPI Followup with CIR stay DATE OF ADMISSION: 05/30/2012  DATE OF DISCHARGE: 06/25/2012  Pain Inventory Average Pain 0 Pain Right Now 0 My pain is no pain  In the last 24 hours, has pain interfered with the following? General activity 0 Relation with others 0 Enjoyment of life 0 What TIME of day is your pain at its worst? no pain Sleep (in general) Fair  Pain is worse with: no pain Pain improves with: no pain med Relief from Meds: no pain med  Mobility use a wheelchair  Function disabled: date disabled see chart I need assistance with the following:  feeding, dressing, bathing, toileting, meal prep, household duties and shopping  Neuro/Psych bowel control problems weakness  Prior Studies Any changes since last visit?  no  Physicians involved in your care Any changes since last visit?  no   Family History  Problem Relation Age of Onset  . Heart disease Mother   . Diabetes Mother   . Colon cancer Neg  Hx   . Prostate cancer Neg Hx    History   Social History  . Marital Status: Married    Spouse Name: N/A    Number of Children: N/A  . Years of Education: N/A   Occupational History  . retired    Social History Main Topics  . Smoking status: Former Smoker    Quit date: 06/07/1958  . Smokeless tobacco: Never Used  . Alcohol Use: No  . Drug Use: No  . Sexually Active: None   Other Topics Concern  . None   Social History Narrative   Lives w/ wife, 3 daughter, 1 son. Has not drive cars since ~ 4098.    Past Surgical History  Procedure Laterality Date  . Left arm    . Rotator cuff repair  2000(L) 1999 (R)   Past Medical History  Diagnosis Date  . Mini stroke   . Irregular heart beat   . Blood clot in vein     left leg dvt  . Dementia   . DVT (deep venous thrombosis)   . Hypercholesteremia   . Arthritis   . Diabetes mellitus    BP 109/55  Pulse 72  Resp 14  Ht 5\' 6"  (1.676 m)  Wt 136 lb (61.689 kg)  BMI 21.96 kg/m2  SpO2 %     Review of Systems  Musculoskeletal: Positive for gait problem.  Neurological: Positive for weakness.  Psychiatric/Behavioral: Positive for confusion.       Dementia  All other systems reviewed and are  negative.       Objective:   Physical Exam  Nursing note and vitals reviewed. Constitutional: He is oriented to person, place, and time. He appears well-developed and well-nourished.  HENT:  Head: Normocephalic and atraumatic.  Eyes: Conjunctivae and EOM are normal. Pupils are equal, round, and reactive to light.  Neck: Normal range of motion. Neck supple.  Neurological: He is alert and oriented to person, place, and time. He displays abnormal reflex. No sensory deficit. Coordination and gait abnormal.  Right hemiataxia Motor strength 5/5 in bilateral UE and LE Poor motor control Ambulation not tested does not have his walker with him  Psychiatric: His affect is blunt. His speech is delayed. He is slowed and withdrawn.  Cognition and memory are impaired. He exhibits abnormal recent memory and abnormal remote memory.          Assessment & Plan:  #1 left PCA distribution infarct As well as a remote left cerebellar and left basal ganglia infarct.  His primary deficit is ataxia. He is having a very slow recovery because of his pre-existing dementia which may be multi-infarct type. His wife is having difficulty taking care of him at home and provides 24-hour seven-day week care. His son works full-time. Family would like to pursue nursing home placement. I advised them that they have until April 17 to do so to fall in the one month window post acute care discharge. I have given him the names of several skilled nursing facilities to visit.  Additional paperwork may be completed by PCP

## 2012-07-06 NOTE — Patient Instructions (Addendum)
[facility] Needs to be admitted before April 17 Contact [name] to  fill out paperwork

## 2012-07-16 ENCOUNTER — Telehealth: Payer: Self-pay

## 2012-07-16 ENCOUNTER — Telehealth: Payer: Self-pay | Admitting: Internal Medicine

## 2012-07-16 NOTE — Telephone Encounter (Signed)
[facility] called to report Mr Raymond Rangel falling.  CALL BACK NUMBER [phone] contact Berton Lan

## 2012-07-16 NOTE — Telephone Encounter (Signed)
Please call back, does he need to be seen? Go to the ER? Serious injury? Does he needs PT a wheelchair?

## 2012-07-16 NOTE — Telephone Encounter (Signed)
Patients daughter called and says that increasing his aricept at night has helped but now they are out of the medication and need a refill.  Please advise.

## 2012-07-16 NOTE — Telephone Encounter (Signed)
Thank you, noted.

## 2012-07-16 NOTE — Telephone Encounter (Signed)
[facility] stated that the pt was trying to get out of the bed & fell, his wife found him on the floor. [facility] stated that the pt was not injured but she just wanted to let us know.   I called & spoke to pt's wife & she stated that the pt was doing ok & was not in any pain.

## 2012-07-17 MED ORDER — DONEPEZIL HCL 5 MG PO TABS: 10.0000 mg | ORAL_TABLET | Freq: Every day | ORAL | Status: DC

## 2012-07-17 NOTE — Telephone Encounter (Signed)
May refill with 6 refills

## 2012-07-17 NOTE — Telephone Encounter (Signed)
Medication sent into pharmacy.  Patient wife informed.

## 2012-07-18 ENCOUNTER — Telehealth: Payer: Self-pay

## 2012-07-18 NOTE — Telephone Encounter (Signed)
[facility] called to get verbal orders for physical therapy.

## 2012-07-22 ENCOUNTER — Telehealth: Payer: Self-pay | Admitting: Internal Medicine

## 2012-07-22 NOTE — Telephone Encounter (Signed)
Needs placement, see paper work

## 2012-07-23 ENCOUNTER — Telehealth: Payer: Self-pay | Admitting: Internal Medicine

## 2012-07-23 NOTE — Telephone Encounter (Signed)
Arline Asp made aware paperwork is ready to be picked up at front desk.

## 2012-07-23 NOTE — Telephone Encounter (Signed)
[name] with [facility] made aware paperwork is ready to be picked up at front desk.

## 2012-07-23 NOTE — Telephone Encounter (Signed)
[name] from [facility] states that she dropped off forms on patient to be filled out by [name]. She wants to know if forms are ready to be picked up?

## 2012-07-29 ENCOUNTER — Telehealth: Payer: Self-pay | Admitting: Internal Medicine

## 2012-07-29 NOTE — Telephone Encounter (Signed)
Advise pt's wife, we still need a urine sample for a UA UCX ----DX UTI

## 2012-07-31 ENCOUNTER — Telehealth: Payer: Self-pay

## 2012-07-31 NOTE — Telephone Encounter (Signed)
Raymond Rangel with occupational therapy called to get extension on occupational therapy for patient for 2 weeks.  Left message giving verbal order.

## 2012-08-01 ENCOUNTER — Telehealth: Payer: Self-pay | Admitting: Internal Medicine

## 2012-08-01 NOTE — Telephone Encounter (Signed)
Patient's wife states she has brought 2 urine samples recently. She states she walked to the lab and dropped them off.

## 2012-08-01 NOTE — Telephone Encounter (Signed)
The first thing we need to do is rule out a UTI. See previous phone note, get a urine sample ASAP. If he is having fever, chills, cough, constipation ---> let me know.  Further advice would result

## 2012-08-01 NOTE — Telephone Encounter (Signed)
Spoke with KiKi, pt is getting confused more & more everyday. The pt is unsure of where he is at or who some people are. The family is wanting to know if there is anything they can do? Please advise.

## 2012-08-01 NOTE — Telephone Encounter (Signed)
I checked with Raymond Rangel. She called [name] to check the status on the UA & UCX. They completed the UA but threw out the urine before sending it to [facility] for the UCX.

## 2012-08-01 NOTE — Telephone Encounter (Signed)
Ki ki from [facility] called to tell dr [name] that Joeseph Amor seems like he Pt is getting more confused each day. . [facility] number is  [phone]

## 2012-08-01 NOTE — Telephone Encounter (Signed)
Apologize to pt, ask for another sample please

## 2012-08-02 MED ORDER — CIPROFLOXACIN HCL 500 MG PO TABS: 500.0000 mg | ORAL_TABLET | Freq: Two times a day (BID) | ORAL | Status: DC

## 2012-08-02 NOTE — Telephone Encounter (Signed)
Addendum: Since pt is not doing well, will treat uti empirically: Call cipro 500 mg 1 po bid #20, redo UCX in 3 weeks

## 2012-08-02 NOTE — Telephone Encounter (Signed)
Called pt, unable to leave a msg. Will try again later.  Sent abx to pharmacy.

## 2012-08-02 NOTE — Telephone Encounter (Signed)
Called pt, unable to leave a msg.   

## 2012-08-02 NOTE — Telephone Encounter (Signed)
Addendum: will treat empirically, see next phone note

## 2012-08-03 NOTE — Telephone Encounter (Signed)
Discussed with pt's wife. 

## 2012-08-07 ENCOUNTER — Emergency Department (HOSPITAL_COMMUNITY): Payer: Medicare Other

## 2012-08-07 ENCOUNTER — Inpatient Hospital Stay (HOSPITAL_COMMUNITY)
Admission: EM | Admit: 2012-08-07 | Discharge: 2012-08-14 | DRG: 871 | Disposition: A | Discharge status: Skilled Nursing Facility | Payer: Medicare Other | Attending: Internal Medicine | Admitting: Internal Medicine

## 2012-08-07 ENCOUNTER — Encounter (HOSPITAL_COMMUNITY): Payer: Self-pay

## 2012-08-07 DIAGNOSIS — F01518 Vascular dementia, unspecified severity, with other behavioral disturbance: Secondary | ICD-10-CM

## 2012-08-07 DIAGNOSIS — E860 Dehydration: Secondary | ICD-10-CM | POA: Diagnosis present

## 2012-08-07 DIAGNOSIS — E785 Hyperlipidemia, unspecified: Secondary | ICD-10-CM

## 2012-08-07 DIAGNOSIS — Z79899 Other long term (current) drug therapy: Secondary | ICD-10-CM

## 2012-08-07 DIAGNOSIS — I82409 Acute embolism and thrombosis of unspecified deep veins of unspecified lower extremity: Secondary | ICD-10-CM

## 2012-08-07 DIAGNOSIS — N390 Urinary tract infection, site not specified: Secondary | ICD-10-CM

## 2012-08-07 DIAGNOSIS — E872 Acidosis, unspecified: Secondary | ICD-10-CM

## 2012-08-07 DIAGNOSIS — J189 Pneumonia, unspecified organism: Secondary | ICD-10-CM

## 2012-08-07 DIAGNOSIS — I69993 Ataxia following unspecified cerebrovascular disease: Secondary | ICD-10-CM

## 2012-08-07 DIAGNOSIS — R6520 Severe sepsis without septic shock: Secondary | ICD-10-CM | POA: Diagnosis present

## 2012-08-07 DIAGNOSIS — A09 Infectious gastroenteritis and colitis, unspecified: Secondary | ICD-10-CM

## 2012-08-07 DIAGNOSIS — F0390 Unspecified dementia without behavioral disturbance: Secondary | ICD-10-CM

## 2012-08-07 DIAGNOSIS — F0151 Vascular dementia with behavioral disturbance: Secondary | ICD-10-CM | POA: Diagnosis present

## 2012-08-07 DIAGNOSIS — I6350 Cerebral infarction due to unspecified occlusion or stenosis of unspecified cerebral artery: Secondary | ICD-10-CM

## 2012-08-07 DIAGNOSIS — R5381 Other malaise: Secondary | ICD-10-CM | POA: Diagnosis present

## 2012-08-07 DIAGNOSIS — R000 Tachycardia, unspecified: Secondary | ICD-10-CM

## 2012-08-07 DIAGNOSIS — E876 Hypokalemia: Secondary | ICD-10-CM

## 2012-08-07 DIAGNOSIS — A419 Sepsis, unspecified organism: Principal | ICD-10-CM

## 2012-08-07 DIAGNOSIS — Z86718 Personal history of other venous thrombosis and embolism: Secondary | ICD-10-CM

## 2012-08-07 DIAGNOSIS — I639 Cerebral infarction, unspecified: Secondary | ICD-10-CM

## 2012-08-07 DIAGNOSIS — H53469 Homonymous bilateral field defects, unspecified side: Secondary | ICD-10-CM

## 2012-08-07 DIAGNOSIS — A0811 Acute gastroenteropathy due to Norwalk agent: Secondary | ICD-10-CM | POA: Diagnosis present

## 2012-08-07 DIAGNOSIS — R197 Diarrhea, unspecified: Secondary | ICD-10-CM

## 2012-08-07 DIAGNOSIS — F0150 Vascular dementia without behavioral disturbance: Secondary | ICD-10-CM | POA: Diagnosis present

## 2012-08-07 DIAGNOSIS — Z8673 Personal history of transient ischemic attack (TIA), and cerebral infarction without residual deficits: Secondary | ICD-10-CM

## 2012-08-07 DIAGNOSIS — E7800 Pure hypercholesterolemia, unspecified: Secondary | ICD-10-CM | POA: Diagnosis present

## 2012-08-07 DIAGNOSIS — J9600 Acute respiratory failure, unspecified whether with hypoxia or hypercapnia: Secondary | ICD-10-CM

## 2012-08-07 DIAGNOSIS — E119 Type 2 diabetes mellitus without complications: Secondary | ICD-10-CM

## 2012-08-07 DIAGNOSIS — Z7982 Long term (current) use of aspirin: Secondary | ICD-10-CM

## 2012-08-07 DIAGNOSIS — Z87891 Personal history of nicotine dependence: Secondary | ICD-10-CM

## 2012-08-07 DIAGNOSIS — I2510 Atherosclerotic heart disease of native coronary artery without angina pectoris: Secondary | ICD-10-CM

## 2012-08-07 DIAGNOSIS — M25511 Pain in right shoulder: Secondary | ICD-10-CM

## 2012-08-07 DIAGNOSIS — M129 Arthropathy, unspecified: Secondary | ICD-10-CM | POA: Diagnosis present

## 2012-08-07 DIAGNOSIS — I672 Cerebral atherosclerosis: Secondary | ICD-10-CM | POA: Diagnosis present

## 2012-08-07 HISTORY — DX: Infectious gastroenteritis and colitis, unspecified: A09

## 2012-08-07 LAB — CBC WITH DIFFERENTIAL/PLATELET
Basophils Absolute: 0 10*3/uL (ref 0.0–0.1)
Eosinophils Absolute: 0 10*3/uL (ref 0.0–0.7)
Lymphs Abs: 0.7 10*3/uL (ref 0.7–4.0)
MCH: 29.7 pg (ref 26.0–34.0)
MCV: 84.7 fL (ref 78.0–100.0)
Monocytes Absolute: 0.3 10*3/uL (ref 0.1–1.0)
Platelets: 244 10*3/uL (ref 150–400)
RDW: 15.5 % (ref 11.5–15.5)
WBC: 13.3 10*3/uL — ABNORMAL HIGH (ref 4.0–10.5)

## 2012-08-07 LAB — LACTIC ACID, PLASMA
Lactic Acid, Venous: 8.2 mmol/L — ABNORMAL HIGH (ref 0.5–2.2)
Lactic Acid, Venous: 4.7 mmol/L — ABNORMAL HIGH (ref 0.5–2.2)

## 2012-08-07 LAB — BASIC METABOLIC PANEL
BUN: 16 mg/dL (ref 6–23)
BUN: 15 mg/dL (ref 6–23)
CO2: 18 mEq/L — ABNORMAL LOW (ref 19–32)
Calcium: 8 mg/dL — ABNORMAL LOW (ref 8.4–10.5)
Calcium: 7.7 mg/dL — ABNORMAL LOW (ref 8.4–10.5)
Creatinine, Ser: 0.98 mg/dL (ref 0.50–1.35)
Creatinine, Ser: 1.07 mg/dL (ref 0.50–1.35)
GFR calc non Af Amer: 64 mL/min — ABNORMAL LOW (ref >90)
Glucose, Bld: 126 mg/dL — ABNORMAL HIGH (ref 70–99)
Sodium: 143 mEq/L (ref 135–145)

## 2012-08-07 LAB — COMPREHENSIVE METABOLIC PANEL
Albumin: 3.2 g/dL — ABNORMAL LOW (ref 3.5–5.2)
BUN: 18 mg/dL (ref 6–23)
Calcium: 8.4 mg/dL (ref 8.4–10.5)
Creatinine, Ser: 1.03 mg/dL (ref 0.50–1.35)
Total Bilirubin: 0.6 mg/dL (ref 0.3–1.2)
Total Protein: 7.5 g/dL (ref 6.0–8.3)

## 2012-08-07 LAB — BLOOD GAS, ARTERIAL
Bicarbonate: 15.2 mEq/L — ABNORMAL LOW (ref 20.0–24.0)
Drawn by: 27022
PEEP: 5 cmH2O
RATE: 24 resp/min
pCO2 arterial: 28.9 mmHg — ABNORMAL LOW (ref 35.0–45.0)
pH, Arterial: 7.342 — ABNORMAL LOW (ref 7.350–7.450)
pO2, Arterial: 76.4 mmHg — ABNORMAL LOW (ref 80.0–100.0)

## 2012-08-07 LAB — POCT I-STAT 3, ART BLOOD GAS (G3+)
Acid-base deficit: 11 mmol/L — ABNORMAL HIGH (ref 0.0–2.0)
Bicarbonate: 12.5 mEq/L — ABNORMAL LOW (ref 20.0–24.0)
O2 Saturation: 90 %
O2 Saturation: 99 %
Patient temperature: 37.8
TCO2: 13 mmol/L (ref 0–100)
pCO2 arterial: 33.7 mmHg — ABNORMAL LOW (ref 35.0–45.0)
pH, Arterial: 7.239 — ABNORMAL LOW (ref 7.350–7.450)
pO2, Arterial: 68 mmHg — ABNORMAL LOW (ref 80.0–100.0)

## 2012-08-07 LAB — URINALYSIS, ROUTINE W REFLEX MICROSCOPIC
Glucose, UA: NEGATIVE mg/dL
Protein, ur: NEGATIVE mg/dL
pH: 5 (ref 5.0–8.0)

## 2012-08-07 LAB — POCT I-STAT, CHEM 8
Creatinine, Ser: 1 mg/dL (ref 0.50–1.35)
HCT: 47 % (ref 39.0–52.0)
Hemoglobin: 16 g/dL (ref 13.0–17.0)
Potassium: 3.2 mEq/L — ABNORMAL LOW (ref 3.5–5.1)
Sodium: 143 mEq/L (ref 135–145)

## 2012-08-07 LAB — URINE MICROSCOPIC-ADD ON

## 2012-08-07 LAB — PROCALCITONIN: Procalcitonin: 9.01 ng/mL

## 2012-08-07 LAB — GLUCOSE, CAPILLARY
Glucose-Capillary: 195 mg/dL — ABNORMAL HIGH (ref 70–99)
Glucose-Capillary: 171 mg/dL — ABNORMAL HIGH (ref 70–99)
Glucose-Capillary: 117 mg/dL — ABNORMAL HIGH (ref 70–99)

## 2012-08-07 LAB — CARBOXYHEMOGLOBIN
Methemoglobin: 0.9 % (ref 0.0–1.5)
Total hemoglobin: 14.2 g/dL (ref 13.5–18.0)

## 2012-08-07 LAB — CORTISOL: Cortisol, Plasma: 34.1 ug/dL

## 2012-08-07 LAB — LIPASE, BLOOD: Lipase: 83 U/L — ABNORMAL HIGH (ref 11–59)

## 2012-08-07 MED ORDER — VANCOMYCIN 50 MG/ML ORAL SOLUTION
500.0000 mg | Freq: Four times a day (QID) | ORAL | Status: DC
  Administered 2012-08-07 – 2012-08-08 (×4): 500 mg via ORAL
  Filled 2012-08-07 (×10): qty 10

## 2012-08-07 MED ORDER — INSULIN ASPART 100 UNIT/ML ~~LOC~~ SOLN
2.0000 [IU] | SUBCUTANEOUS | Status: DC
  Administered 2012-08-07 (×2): 4 [IU] via SUBCUTANEOUS
  Administered 2012-08-08 (×2): 2 [IU] via SUBCUTANEOUS

## 2012-08-07 MED ORDER — FAMOTIDINE IN NACL 20-0.9 MG/50ML-% IV SOLN
20.0000 mg | Freq: Two times a day (BID) | INTRAVENOUS | Status: DC
  Administered 2012-08-07 – 2012-08-08 (×4): 20 mg via INTRAVENOUS
  Filled 2012-08-07 (×7): qty 50

## 2012-08-07 MED ORDER — ALBUTEROL SULFATE (5 MG/ML) 0.5% IN NEBU
INHALATION_SOLUTION | RESPIRATORY_TRACT | Status: AC
  Filled 2012-08-07: qty 1

## 2012-08-07 MED ORDER — VANCOMYCIN HCL IN DEXTROSE 1-5 GM/200ML-% IV SOLN
1000.0000 mg | Freq: Once | INTRAVENOUS | Status: AC
  Administered 2012-08-07: 1000 mg via INTRAVENOUS
  Filled 2012-08-07: qty 200

## 2012-08-07 MED ORDER — PIPERACILLIN-TAZOBACTAM 3.375 G IVPB
3.3750 g | Freq: Once | INTRAVENOUS | Status: DC
  Administered 2012-08-07: 3.375 g via INTRAVENOUS
  Filled 2012-08-07: qty 50

## 2012-08-07 MED ORDER — ACETAMINOPHEN 160 MG/5ML PO SOLN
650.0000 mg | Freq: Four times a day (QID) | ORAL | Status: DC | PRN
  Administered 2012-08-07: 650 mg via ORAL
  Filled 2012-08-07: qty 20.3

## 2012-08-07 MED ORDER — PIPERACILLIN-TAZOBACTAM 3.375 G IVPB
3.3750 g | Freq: Three times a day (TID) | INTRAVENOUS | Status: DC
  Filled 2012-08-07 (×2): qty 50

## 2012-08-07 MED ORDER — MIDAZOLAM HCL 2 MG/2ML IJ SOLN
INTRAMUSCULAR | Status: AC
  Administered 2012-08-07: 2 mg
  Filled 2012-08-07: qty 2

## 2012-08-07 MED ORDER — CLOPIDOGREL BISULFATE 75 MG PO TABS
75.0000 mg | ORAL_TABLET | Freq: Every day | ORAL | Status: DC
  Administered 2012-08-08 – 2012-08-14 (×7): 75 mg via NASOGASTRIC
  Filled 2012-08-07 (×9): qty 1

## 2012-08-07 MED ORDER — CHLORHEXIDINE GLUCONATE 0.12 % MT SOLN
15.0000 mL | Freq: Two times a day (BID) | OROMUCOSAL | Status: DC
  Administered 2012-08-07 – 2012-08-08 (×3): 15 mL via OROMUCOSAL
  Filled 2012-08-07 (×3): qty 15

## 2012-08-07 MED ORDER — SUCCINYLCHOLINE CHLORIDE 20 MG/ML IJ SOLN
INTRAMUSCULAR | Status: AC
  Filled 2012-08-07: qty 1

## 2012-08-07 MED ORDER — IPRATROPIUM BROMIDE 0.02 % IN SOLN
0.5000 mg | Freq: Once | RESPIRATORY_TRACT | Status: AC
  Administered 2012-08-07: 0.5 mg via RESPIRATORY_TRACT
  Filled 2012-08-07: qty 2.5

## 2012-08-07 MED ORDER — PIPERACILLIN-TAZOBACTAM 3.375 G IVPB
3.3750 g | Freq: Three times a day (TID) | INTRAVENOUS | Status: DC
  Filled 2012-08-07: qty 50

## 2012-08-07 MED ORDER — FENTANYL CITRATE 0.05 MG/ML IJ SOLN
INTRAMUSCULAR | Status: AC
  Administered 2012-08-07: 100 ug
  Filled 2012-08-07: qty 2

## 2012-08-07 MED ORDER — POTASSIUM CHLORIDE 10 MEQ/50ML IV SOLN
10.0000 meq | INTRAVENOUS | Status: DC
  Administered 2012-08-07 (×6): 10 meq via INTRAVENOUS
  Filled 2012-08-07: qty 300

## 2012-08-07 MED ORDER — SODIUM CHLORIDE 0.9 % IV SOLN: 250.0000 mL | INTRAVENOUS | Status: DC | PRN

## 2012-08-07 MED ORDER — ROCURONIUM BROMIDE 50 MG/5ML IV SOLN
INTRAVENOUS | Status: AC
  Filled 2012-08-07: qty 2

## 2012-08-07 MED ORDER — LIDOCAINE HCL (CARDIAC) 20 MG/ML IV SOLN
INTRAVENOUS | Status: AC
  Filled 2012-08-07: qty 5

## 2012-08-07 MED ORDER — SODIUM BICARBONATE 8.4 % IV SOLN
INTRAVENOUS | Status: DC
  Administered 2012-08-07 (×2): via INTRAVENOUS
  Filled 2012-08-07 (×8): qty 150

## 2012-08-07 MED ORDER — SODIUM CHLORIDE 0.9 % IV BOLUS (SEPSIS): 1000.0000 mL | INTRAVENOUS | Status: DC | PRN

## 2012-08-07 MED ORDER — ALBUTEROL SULFATE HFA 108 (90 BASE) MCG/ACT IN AERS
4.0000 | INHALATION_SPRAY | RESPIRATORY_TRACT | Status: DC
  Administered 2012-08-07 – 2012-08-08 (×6): 4 via RESPIRATORY_TRACT
  Filled 2012-08-07: qty 6.7

## 2012-08-07 MED ORDER — SODIUM CHLORIDE 0.9 % IV SOLN
25.0000 ug/h | INTRAVENOUS | Status: DC
  Administered 2012-08-07: 50 ug/h via INTRAVENOUS
  Filled 2012-08-07 (×3): qty 50

## 2012-08-07 MED ORDER — POTASSIUM CHLORIDE 10 MEQ/50ML IV SOLN
INTRAVENOUS | Status: AC
  Filled 2012-08-07: qty 50

## 2012-08-07 MED ORDER — SODIUM CHLORIDE 0.9 % IV BOLUS (SEPSIS)
500.0000 mL | Freq: Once | INTRAVENOUS | Status: AC
  Administered 2012-08-07: 500 mL via INTRAVENOUS

## 2012-08-07 MED ORDER — ALBUTEROL SULFATE (5 MG/ML) 0.5% IN NEBU
5.0000 mg | INHALATION_SOLUTION | Freq: Once | RESPIRATORY_TRACT | Status: AC
  Administered 2012-08-07: 5 mg via RESPIRATORY_TRACT

## 2012-08-07 MED ORDER — METRONIDAZOLE IN NACL 5-0.79 MG/ML-% IV SOLN
500.0000 mg | Freq: Four times a day (QID) | INTRAVENOUS | Status: DC
  Administered 2012-08-07 – 2012-08-08 (×4): 500 mg via INTRAVENOUS
  Filled 2012-08-07 (×6): qty 100

## 2012-08-07 MED ORDER — HEPARIN SODIUM (PORCINE) 5000 UNIT/ML IJ SOLN
5000.0000 [IU] | Freq: Three times a day (TID) | INTRAMUSCULAR | Status: DC
  Filled 2012-08-07 (×3): qty 1

## 2012-08-07 MED ORDER — ETOMIDATE 2 MG/ML IV SOLN
INTRAVENOUS | Status: AC
  Administered 2012-08-07: 20 mg
  Filled 2012-08-07: qty 20

## 2012-08-07 MED ORDER — DEXTROSE 5 % IV SOLN
5.0000 ug/min | INTRAVENOUS | Status: DC
  Filled 2012-08-07 (×2): qty 4

## 2012-08-07 MED ORDER — ADENOSINE 6 MG/2ML IV SOLN
INTRAVENOUS | Status: AC
  Filled 2012-08-07: qty 4

## 2012-08-07 MED ORDER — AMIODARONE IV BOLUS ONLY 150 MG/100ML
150.0000 mg | Freq: Once | INTRAVENOUS | Status: DC
  Filled 2012-08-07: qty 100

## 2012-08-07 MED ORDER — HEPARIN SODIUM (PORCINE) 5000 UNIT/ML IJ SOLN
5000.0000 [IU] | Freq: Three times a day (TID) | INTRAMUSCULAR | Status: DC
  Administered 2012-08-07 – 2012-08-14 (×21): 5000 [IU] via SUBCUTANEOUS
  Filled 2012-08-07 (×28): qty 1

## 2012-08-07 MED ORDER — BIOTENE DRY MOUTH MT LIQD
15.0000 mL | Freq: Four times a day (QID) | OROMUCOSAL | Status: DC
  Administered 2012-08-07 – 2012-08-08 (×5): 15 mL via OROMUCOSAL

## 2012-08-07 MED ORDER — VANCOMYCIN HCL 500 MG IV SOLR
500.0000 mg | Freq: Two times a day (BID) | INTRAVENOUS | Status: DC
  Administered 2012-08-07: 500 mg via INTRAVENOUS
  Filled 2012-08-07 (×3): qty 500

## 2012-08-07 MED ORDER — POTASSIUM CHLORIDE 10 MEQ/50ML IV SOLN: 10.0000 meq | INTRAVENOUS | Status: DC

## 2012-08-07 MED ORDER — SODIUM CHLORIDE 0.9 % IV SOLN
250.0000 mg | Freq: Four times a day (QID) | INTRAVENOUS | Status: DC
  Administered 2012-08-07 – 2012-08-08 (×4): 250 mg via INTRAVENOUS
  Filled 2012-08-07 (×6): qty 250

## 2012-08-07 MED ORDER — ASPIRIN 81 MG PO CHEW
81.0000 mg | CHEWABLE_TABLET | Freq: Every day | ORAL | Status: DC
  Administered 2012-08-07 – 2012-08-14 (×8): 81 mg via NASOGASTRIC
  Filled 2012-08-07 (×8): qty 1

## 2012-08-07 MED ORDER — FENTANYL CITRATE 0.05 MG/ML IJ SOLN
25.0000 ug | INTRAMUSCULAR | Status: DC | PRN
  Administered 2012-08-07: 50 ug via INTRAVENOUS
  Filled 2012-08-07: qty 2

## 2012-08-07 MED ORDER — FENTANYL BOLUS VIA INFUSION
25.0000 ug | Freq: Four times a day (QID) | INTRAVENOUS | Status: DC | PRN
  Filled 2012-08-07: qty 100

## 2012-08-07 MED ORDER — MIDAZOLAM HCL 2 MG/2ML IJ SOLN
1.0000 mg | INTRAMUSCULAR | Status: DC | PRN
  Administered 2012-08-07: 2 mg via INTRAVENOUS
  Filled 2012-08-07: qty 2

## 2012-08-07 MED ORDER — ADENOSINE 6 MG/2ML IV SOLN
6.0000 mg | Freq: Once | INTRAVENOUS | Status: AC
  Administered 2012-08-07: 6 mg via INTRAVENOUS

## 2012-08-07 NOTE — Procedures (Signed)
Intubation Procedure Note Raymond Rangel [id number] 02/27/1932  Procedure: Intubation Indications: Respiratory insufficiency  Procedure Details Consent: Unable to obtain consent because of emergent medical necessity. Time Out: Verified patient identification, verified procedure, site/side was marked, verified correct patient position, special equipment/implants available, medications/allergies/relevent history reviewed, required imaging and test results available.  Performed  Maximum sterile technique was used including antiseptics, cap, gloves and gown.  MAC 3 glide scope w/ #8    Evaluation Hemodynamic Status: BP stable throughout; O2 sats: stable throughout Patient's Current Condition: stable Complications: No apparent complications Patient did tolerate procedure well. Chest X-ray ordered to verify placement.  CXR: tube position low-repostitioned.   BABCOCK,PETE 08/07/2012  Tolerated well  Mcarthur Rossetti. Tyson Alias, MD, FACP Pgr: [phone] [facility]

## 2012-08-07 NOTE — H&P (Addendum)
PULMONARY  / CRITICAL CARE MEDICINE  Name: Raymond [facility] MRN: [id number] DOB: 1931/08/17    ADMISSION DATE:  08/07/2012   PRIMARY SERVICE: PCCM  CHIEF COMPLAINT:  Shock./resp failure   BRIEF PATIENT DESCRIPTION:  Raymond is a 50 YOM who resides at [facility]. Admitted on 4/29 w/ weakness, dehydration, sepsis and respiratory failure in setting of presumed infectious colitis and PNA.    SIGNIFICANT EVENTS / STUDIES:  4/29- ETT, distress  LINES / TUBES OETT 4/29>>> Right Rankin CVL 4/29>>>  CULTURES: BCX2 4/29>>> UC 4/29>>> Sputum 4/29>>> U strep 4/29>>> U legionella 4/29>>> c diff PCR 4/29>>> norovirus PCR 4/29>>>  ANTIBIOTICS: vanc 4/29>>> Zosyn 4/29>>> PO Vanc 4/29>>> Flagyl 4/29>>>  HISTORY OF PRESENT ILLNESS:   Raymond is a 81 YOM who resides at a [facility] due to vascular dementia and prior CVA. [facility]. Notes indicate that pt did have similar symptoms. Then there is an office phone call that indicates that on 4/23 the pt had become progressively confused. The confusion continued to worsen and was associated w/ increased work of breathing over the following days. In Raymond time Raymond was treated for possible  UTI. His condition continued to worsen w/ marked increase in WOB which is what brought him to the ER on 4/29. On presentation Raymond was confused, lethargic, Meets SIRS criteria w/ RLL infiltrate on CXR. Raymond was admitted to the [facility] service for further rx.   PAST MEDICAL HISTORY :  Past Medical History  Diagnosis Date  . Mini stroke   . Irregular heart beat   . Blood clot in vein     left leg dvt  . Dementia   . DVT (deep venous thrombosis)   . Hypercholesteremia   . Arthritis   . Diabetes mellitus    Past Surgical History  Procedure Laterality Date  . Left arm    . Rotator cuff repair  2000(L) 1999 (R)   Prior to Admission medications   Medication Sig Start Date End Date Taking? Authorizing Provider  aspirin  81 MG chewable tablet Chew 1 tablet (81 mg total) by mouth daily. 06/25/12   Mcarthur Rossetti Angiulli, PA-C  atorvastatin (LIPITOR) 40 MG tablet Take 1 tablet (40 mg total) by mouth daily. 06/25/12   Mcarthur Rossetti Angiulli, PA-C  ciprofloxacin (CIPRO) 500 MG tablet Take 1 tablet (500 mg total) by mouth 2 (two) times daily. 08/02/12   Wanda Plump, MD  clopidogrel (PLAVIX) 75 MG tablet Take 1 tablet (75 mg total) by mouth daily with breakfast. 07/05/12   Wanda Plump, MD  donepezil (ARICEPT) 5 MG tablet Take 2 tablets (10 mg total) by mouth at bedtime. 07/17/12   Erick Colace, MD  glucose blood test strip Use as instructed 06/27/12   Wanda Plump, MD  ONE [facility] LANCETS MISC Check once daily. 06/27/12   Wanda Plump, MD  QUEtiapine (SEROQUEL) 25 MG tablet Take 1 tablet (25 mg total) by mouth 2 (two) times daily. 06/25/12   Mcarthur Rossetti Angiulli, PA-C   No Known Allergies  FAMILY HISTORY:  Family History  Problem Relation Age of Onset  . Heart disease Mother   . Diabetes Mother   . Colon cancer Neg Hx   . Prostate cancer Neg Hx    SOCIAL HISTORY:  reports that Raymond quit smoking about 54 years ago. Raymond has never used smokeless tobacco. Raymond reports that Raymond does not drink alcohol  or use illicit drugs.  REVIEW OF SYSTEMS:  unabe  SUBJECTIVE: critically ill   VITAL SIGNS: Temp:  [100 F (37.8 C)] 100 F (37.8 C) (04/29 0812) Pulse Rate:  [60-144] 60 (04/29 0845) Resp:  [20-36] 27 (04/29 0900) BP: (110-155)/(67-97) 110/67 mmHg (04/29 0900) SpO2:  [91 %-98 %] 91 % (04/29 0845) FiO2 (%):  [100 %] 100 % (04/29 0847) Weight:  [61.7 kg (136 lb 0.4 oz)] 61.7 kg (136 lb 0.4 oz) (04/29 0847) HEMODYNAMICS:   VENTILATOR SETTINGS: Vent Mode:  [-] PRVC FiO2 (%):  [100 %] 100 % Set Rate:  [24 bmp] 24 bmp Vt Set:  [500 mL] 500 mL PEEP:  [5 cmH20] 5 cmH20 Plateau Pressure:  [17 cmH20] 17 cmH20 INTAKE / OUTPUT: Intake/Output   None     PHYSICAL EXAMINATION: General:  Chronically ill appearing AAM, now intubated and  sedated.  Neuro:  Oriented x 1, generalized weakness. Sp slurred  HEENT:  Poor dentition. MM moist Orally intubated  Cardiovascular:  Tachy rrr Lungs:  Crackles in Right base, scattered rhonchi, marked accessory muscle use prior to intubation  Abdomen:  Soft, + bowel sounds, no OM Musculoskeletal:  Intact  Skin:  intact  LABS:  Recent Labs Lab 08/07/12 0725 08/07/12 0735  NA 138 143  K 3.4* 3.2*  CL 106 113*  CO2 12*  --   BUN 18 19  CREATININE 1.03 1.00  GLUCOSE 207* 213*    Recent Labs Lab 08/07/12 0725 08/07/12 0735  HGB 15.3 16.0  HCT 43.7 47.0  WBC 13.3*  --   PLT 244  --     Recent Labs Lab 08/07/12 0725 08/07/12 0748  WBC 13.3*  --   LATICACIDVEN  --  8.2*    No results found for Raymond basename: GLUCAP,  in the last 168 hours  CXR: RLL airspace disease, C/W PNA. ETT and right IJ in good position.   ASSESSMENT / PLAN:  PULMONARY A:acute respiratory failure: in setting of PNA, metabolic acidosis and inability to compensate     Pneumonia (HCAP) NO ARDS on pcxr as of now P:   Intubate/ventilate, elevate rate / MV, repeat abg F/u abg Sedation protocol required F/u CXR for rt base See ID section   CARDIOVASCULAR A: SIRS/sepsis  P: Repeat lactate-->if elevated will start EGDT protocol  Central access required, cvp, svo2, no role dobut, blood Fluid challenge Cortisol x 1 cvp goal 10-12  RENAL A:  Mixed gap/NAG acidosis: d/t lactic acidosis and bicarb loss [facility] diarrhea       Hypokalemia  P:   Bicarb gtt for non ag Replace K Trend bmet in afternoon  GASTROINTESTINAL A:  Diarrhea: concern for infectious colitis Severe Malnutrition P:   Send stool cultures  NPO H2 blockade   HEMATOLOGIC A: h/o DVt , now on vent Do suspect that current CBC represents hemoconcentration.  P:  [place] heparin  Trend cbc Anticipate hct drop  INFECTIOUS A:  HCAP      Probable infectious colitis? R/o cdiff      SIRS/sepsis -occult septic shock P:   See  dashboard  Pan culture/broad spec abx Change zosyn to IMI[place]em, high risk esbl  ENDOCRINE A:  DM/ hyperglycemia  P:   ssi  NEUROLOGIC A:  H/o CVA      H/o vascular dementia  P:   Supportive care  fent drip addition Avoid benzo  TODAY'S SUMMARY: 65 yom [facility] admitted w/ sirs/sepsis [facility] pna and prob infectious colitis. Intubated. Will admit  to ICU and provide supportive care as outline above. Low threshold for EGDT protocol.  Will speak with family , consider dnr  I have personally obtained a history, examined the patient, evaluated laboratory and imaging results, formulated the assessment and plan and placed orders. CRITICAL CARE: The patient is critically ill with multiple organ systems failure and requires high complexity decision making for assessment and support, frequent evaluation and titration of therapies, application of advanced monitoring technologies and extensive interpretation of multiple databases. Critical Care Time devoted to patient care services described in Raymond note is 35 minutes.   Mcarthur Rossetti. Tyson Alias, MD, FACP Pgr: [phone] [facility]  Pulmonary and Critical Care Medicine [facility] Pager: [phone]  08/07/2012, 9:49 AM

## 2012-08-07 NOTE — Progress Notes (Signed)
ANTIBIOTIC CONSULT NOTE - INITIAL  Pharmacy Consult for Vancomycin and Zosyn Indication: pneumonia  No Known Allergies  Patient Measurements: Height: 6' (182.9 cm) Weight: 136 lb 0.4 oz (61.7 kg) IBW/kg (Calculated) : 77.6   Vital Signs: Temp: 100 F (37.8 C) (04/29 0812) Temp src: Rectal (04/29 0812) BP: 110/67 mmHg (04/29 0900) Pulse Rate: 60 (04/29 0845) Intake/Output from previous day:   Intake/Output from this shift:    Labs:  Recent Labs  08/07/12 0725 08/07/12 0735  WBC 13.3*  --   HGB 15.3 16.0  PLT 244  --   CREATININE 1.03 1.00   Estimated Creatinine Clearance: 52.3 ml/min (by C-G formula based on Cr of 1). No results found for this basename: VANCOTROUGH, VANCOPEAK, VANCORANDOM, GENTTROUGH, GENTPEAK, GENTRANDOM, TOBRATROUGH, TOBRAPEAK, TOBRARND, AMIKACINPEAK, AMIKACINTROU, AMIKACIN,  in the last 72 hours   Microbiology: No results found for this or any previous visit (from the past 720 hour(s)).  Medical History: Past Medical History  Diagnosis Date  . Mini stroke   . Irregular heart beat   . Blood clot in vein     left leg dvt  . Dementia   . DVT (deep venous thrombosis)   . Hypercholesteremia   . Arthritis   . Diabetes mellitus     Medications:  Scheduled:  . [COMPLETED] adenosine (ADENOCARD) IV  6 mg Intravenous Once  . adenosine      . albuterol  4 puff Inhalation Q4H  . [COMPLETED] albuterol  5 mg Nebulization Once  . albuterol      . aspirin  81 mg Per NG tube Daily  . [START ON 08/08/2012] clopidogrel  75 mg Per NG tube Q breakfast  . [COMPLETED] etomidate      . [COMPLETED] fentaNYL      . heparin  5,000 Units Subcutaneous Q8H  . [COMPLETED] ipratropium  0.5 mg Nebulization Once  . lidocaine (cardiac) 100 mg/15ml      . [COMPLETED] midazolam      . [COMPLETED] midazolam      . rocuronium      . succinylcholine      . vancomycin  500 mg Oral Q6H   Assessment: 79 yr old male on vancomycin and zosyn for possible PNA.  Patient  was on po cipro PTA.  Vancomycin 1gm IV already given in ED.   Goal of Therapy: Vancomycin trough level 15-20 mcg/ml  Plan:  Start Vancomycin 500mg  IV q12hrs and Zosyn 3.375gm IV q8hrs. F/u cultures, renal func.  Wendie Simmer, PharmD, BCPS Clinical Pharmacist  Pager: [phone]    Marylouise Stacks 08/07/2012,9:47 AM

## 2012-08-07 NOTE — Procedures (Signed)
Central Venous Catheter Insertion Procedure Note Breckin [place] [id number] [birth year]  Procedure: Insertion of Central Venous Catheter Indications: Assessment of intravascular volume, Drug and/or fluid administration and Frequent blood sampling  Procedure Details Consent: Unable to obtain consent because of emergent medical necessity. Time Out: Verified patient identification, verified procedure, site/side was marked, verified correct patient position, special equipment/implants available, medications/allergies/relevent history reviewed, required imaging and test results available.  Performed  Maximum sterile technique was used including antiseptics, cap, gloves, gown, hand hygiene, mask and sheet. Skin prep: Chlorhexidine; local anesthetic administered A antimicrobial bonded/coated triple lumen catheter was placed in the right internal jugular vein using the Seldinger technique.  Evaluation Blood flow good Complications: No apparent complications Patient did tolerate procedure well. Chest X-ray ordered to verify placement.  CXR: pending.  BABCOCK,PETE 08/07/2012, 10:10 AM  Emergent need [place]  Mcarthur Rossetti. Tyson Alias, MD, FACP Pgr: [phone]  Pulmonary & Critical Care

## 2012-08-07 NOTE — ED Provider Notes (Signed)
History     CSN: [id number]  Arrival date & time 08/07/12  2440   First MD Initiated Contact with Patient 08/07/12 [phone]      Chief Complaint  Patient presents with  . Respiratory Distress    (Consider location/radiation/quality/duration/timing/severity/associated sxs/prior treatment) The history is provided by the patient and the EMS personnel. The history is limited by the condition of the patient.  From review recent notes appears pt w gen weakness, altered ms, fevers, nvd in past week at [facility].  Was txd empirically w abx for possible infection ?uti vs pna. Today at [facility] w dyspnea, gen weakness, altered mental status. ems noted hr in 150 range, placed on NRB.  Pt unable to give hx due to illness, dyspnea, altered ms - level 5 caveat.       Past Medical History  Diagnosis Date  . Mini stroke   . Irregular heart beat   . Blood clot in vein     left leg dvt  . Dementia   . DVT (deep venous thrombosis)   . Hypercholesteremia   . Arthritis   . Diabetes mellitus     Past Surgical History  Procedure Laterality Date  . Left arm    . Rotator cuff repair  2000(L) 1999 (R)    Family History  Problem Relation Age of Onset  . Heart disease Mother   . Diabetes Mother   . Colon cancer Neg Hx   . Prostate cancer Neg Hx     History  Substance Use Topics  . Smoking status: Former Smoker    Quit date: 06/07/1958  . Smokeless tobacco: Never Used  . Alcohol Use: No      Review of Systems  Unable to perform ROS: Mental status change  level 5 caveat - altered ms, dyspnea, distress  Allergies  Review of patient's allergies indicates no known allergies.  Home Medications   Current Outpatient Rx  Name  Route  Sig  Dispense  Refill  . aspirin 81 MG chewable tablet   Oral   Chew 1 tablet (81 mg total) by mouth daily.         Marland Kitchen atorvastatin (LIPITOR) 40 MG tablet   Oral   Take 1 tablet (40 mg total) by mouth daily.   30 tablet   1   . ciprofloxacin (CIPRO) 500 MG  tablet   Oral   Take 1 tablet (500 mg total) by mouth 2 (two) times daily.   20 tablet   0   . clopidogrel (PLAVIX) 75 MG tablet   Oral   Take 1 tablet (75 mg total) by mouth daily with breakfast.   30 tablet   1   . donepezil (ARICEPT) 5 MG tablet   Oral   Take 2 tablets (10 mg total) by mouth at bedtime.   30 tablet   6   . glucose blood test strip      Use as instructed   100 each   12     Check once daily.  Dx: 250.00 onetouch ultra2   . ONE TOUCH LANCETS MISC      Check once daily.   200 each   6     Check once daily. Dx: 250.00 onetouch unltra2   . QUEtiapine (SEROQUEL) 25 MG tablet   Oral   Take 1 tablet (25 mg total) by mouth 2 (two) times daily.   60 tablet   1     BP 122/83  Pulse 99  Resp  20  SpO2 98%  Physical Exam  Nursing note and vitals reviewed. Constitutional: He appears distressed.  Very thin, frail elderly gentleman in resp distress  HENT:  Head: Atraumatic.  Mouth/Throat: Oropharynx is clear and moist.  Eyes: Conjunctivae are normal. Pupils are equal, round, and reactive to light. No scleral icterus.  Neck: Neck supple. No tracheal deviation present.  Cardiovascular: Regular rhythm, normal heart sounds and intact distal pulses.   tachycardic  Pulmonary/Chest: No accessory muscle usage. He is in respiratory distress.  Rhonchi bil. Wheezing.   Abdominal: Soft. Bowel sounds are normal. He exhibits no distension and no mass. There is no tenderness. There is no rebound and no guarding.  Genitourinary:  No cva tenderness  Musculoskeletal: Normal range of motion. He exhibits no edema and no tenderness.  Neurological: He is alert.  Alert appearing. Restless. Not following commands. Moves bil extremities purposefully  Skin: Skin is warm and dry. No rash noted.    ED Course  Procedures (including critical care time)   Results for orders placed during the hospital encounter of 08/07/12  CBC WITH DIFFERENTIAL      Result Value  Range   WBC 13.3 (*) 4.0 - 10.5 K/uL   RBC 5.16  4.22 - 5.81 MIL/uL   Hemoglobin 15.3  13.0 - 17.0 g/dL   HCT 16.1  09.6 - 04.5 %   MCV 84.7  78.0 - 100.0 fL   MCH 29.7  26.0 - 34.0 pg   MCHC 35.0  30.0 - 36.0 g/dL   RDW 40.9  81.1 - 91.4 %   Platelets 244  150 - 400 K/uL   Neutrophils Relative 93 (*) 43 - 77 %   Lymphocytes Relative 5 (*) 12 - 46 %   Monocytes Relative 2 (*) 3 - 12 %   Eosinophils Relative 0  0 - 5 %   Basophils Relative 0  0 - 1 %   Neutro Abs 12.3 (*) 1.7 - 7.7 K/uL   Lymphs Abs 0.7  0.7 - 4.0 K/uL   Monocytes Absolute 0.3  0.1 - 1.0 K/uL   Eosinophils Absolute 0.0  0.0 - 0.7 K/uL   Basophils Absolute 0.0  0.0 - 0.1 K/uL   RBC Morphology POLYCHROMASIA PRESENT     WBC Morphology INCREASED BANDS (>20% BANDS)    COMPREHENSIVE METABOLIC PANEL      Result Value Range   Sodium 138  135 - 145 mEq/L   Potassium 3.4 (*) 3.5 - 5.1 mEq/L   Chloride 106  96 - 112 mEq/L   CO2 12 (*) 19 - 32 mEq/L   Glucose, Bld 207 (*) 70 - 99 mg/dL   BUN 18  6 - 23 mg/dL   Creatinine, Ser 7.82  0.50 - 1.35 mg/dL   Calcium 8.4  8.4 - 95.6 mg/dL   Total Protein 7.5  6.0 - 8.3 g/dL   Albumin 3.2 (*) 3.5 - 5.2 g/dL   AST 30  0 - 37 U/L   ALT 11  0 - 53 U/L   Alkaline Phosphatase 126 (*) 39 - 117 U/L   Total Bilirubin 0.6  0.3 - 1.2 mg/dL   GFR calc non Af Amer 67 (*) >90 mL/min   GFR calc Af Amer 78 (*) >90 mL/min  TROPONIN I      Result Value Range   Troponin I <0.30  <0.30 ng/mL  LACTIC ACID, PLASMA      Result Value Range   Lactic Acid, Venous 8.2 (*) 0.5 -  2.2 mmol/L  PROTIME-INR      Result Value Range   Prothrombin Time 14.4  11.6 - 15.2 seconds   INR 1.14  0.00 - 1.49  LIPASE, BLOOD      Result Value Range   Lipase 83 (*) 11 - 59 U/L  POCT I-STAT, CHEM 8      Result Value Range   Sodium 143  135 - 145 mEq/L   Potassium 3.2 (*) 3.5 - 5.1 mEq/L   Chloride 113 (*) 96 - 112 mEq/L   BUN 19  6 - 23 mg/dL   Creatinine, Ser 1.61  0.50 - 1.35 mg/dL   Glucose, Bld 096 (*)  70 - 99 mg/dL   Calcium, Ion 0.45 (*) 1.13 - 1.30 mmol/L   TCO2 14  0 - 100 mmol/L   Hemoglobin 16.0  13.0 - 17.0 g/dL   HCT 40.9  81.1 - 91.4 %  POCT I-STAT 3, BLOOD GAS (G3+)      Result Value Range   pH, Arterial 7.311 (*) 7.350 - 7.450   pCO2 arterial 25.1 (*) 35.0 - 45.0 mmHg   pO2, Arterial 68.0 (*) 80.0 - 100.0 mmHg   Bicarbonate 12.5 (*) 20.0 - 24.0 mEq/L   TCO2 13  0 - 100 mmol/L   O2 Saturation 90.0     Acid-base deficit 11.0 (*) 0.0 - 2.0 mmol/L   Patient temperature 100.8 F     Collection site RADIAL, ALLEN'S TEST ACCEPTABLE     Drawn by RT     Sample type ARTERIAL     Dg Chest Portable 1 View  08/07/2012  *RADIOLOGY REPORT*  Clinical Data: Respiratory distress.  Right central line placement. Endotracheal tube placement.  PORTABLE CHEST - 1 VIEW  Comparison: 08/07/2012  Findings: Endotracheal tube has been placed the tip 4 cm above the carina.  Right central line tip is at the cavoatrial junction.  No pneumothorax.  Patchy right lower lobe and perihilar airspace opacities.  Minimal left base opacities.  Airspace disease in the right lung is increased slightly since prior study.  No effusions. Heart is normal size.  IMPRESSION: The right central line tip at the cavoatrial junction without pneumothorax.  Endotracheal tube tip 4 cm above the carina.  Slight increased right perihilar and lower lobe airspace opacities.   Original Report Authenticated By: Charlett Nose, M.D.    Dg Chest Port 1 View  08/07/2012  *RADIOLOGY REPORT*  Clinical Data: Shortness of breath.  Rapid heart rate.  PORTABLE CHEST - 1 VIEW  Comparison: 06/20/2012  Findings: Patchy bilateral lower lobe airspace opacities, right greater than left concerning for pneumonia.  Heart is normal size. No visible effusions or acute bony abnormality.  IMPRESSION: Patchy bilateral lower lobe areas of consolidation concerning for multifocal pneumonia.   Original Report Authenticated By: Charlett Nose, M.D.       MDM  Iv ns  bolus. o2 mask. Continuous pulse ox and monitor.  Reviewed nursing notes and prior charts for additional history.   From review recent notes appears pt w gen weakness, altered ms, fevers, nvd in past week at [facility].  Was txd empirically w abx for possible infection ?uti vs pna. Today at [facility] w dyspnea, gen weakness, altered mental status. ems noted hr in 150 range, placed on NRB.    ems had given 1 liter ns, additional ivf ordered on arrival to ed, bolus.  After arrival pt w tachycardic rhythm up to 170, on monitor/recheck ?wide complex, pt w pulses - ?  indeterminate wct - amiodarone bolus, no change. Adenosine - appears underlying sinus rhythm.    w ivf/tx, hr 140 range, narrow complex. For wheezing given albuterol/atrovent neb. Additional ivf.  Hr 120's, sinus tachy/narrow complex.   cxr w multifocal pna. Recent hospitalization for cva. Zosyn and vanc iv.   Hx dementia, recent cva, from [facility], code status unclear from report. Critical care called to see/admit to icu.  Labs/abg pending.   Family member/son arrives, states wishes full code, intub/vent/cpr if needed, full medical tx.  CRITICAL CARE Performed by: Suzi Roots   Total critical care time: 45  Critical care time was exclusive of separately billable procedures and treating other patients.  Critical care was necessary to treat or prevent imminent or life-threatening deterioration.  Critical care was time spent personally by me on the following activities: development of treatment plan with patient and/or surrogate as well as nursing, discussions with consultants, evaluation of patient's response to treatment, examination of patient, obtaining history from patient or surrogate, ordering and performing treatments and interventions, ordering and review of laboratory studies, ordering and review of radiographic studies, pulse oximetry and re-evaluation of patient's condition.   Pt intubated/central line via critical care service.      Suzi Roots, MD 08/07/12 [phone]

## 2012-08-07 NOTE — ED Notes (Signed)
Pt to ed from [facility] nursing home.  sts some shortness of breath throughout the night.  EMS sts that he has had n,v,d x 2 days.  En route here pt.  Was placed on cpap tolerated well at first but then didn't.  Pt was placed back on nrb.  Pt was in afib 80-170's.

## 2012-08-07 NOTE — Progress Notes (Signed)
I have had extensive discussions with family wife, son. We discussed patients current circumstances and organ failures. We also discussed patient's prior wishes under circumstances such as this. Family has decided to NOT perform resuscitation if arrest but to continue current medical support for now. Aggressive care otehrwise  Mcarthur Rossetti. Tyson Alias, MD, FACP Pgr: [phone] [facility]

## 2012-08-07 NOTE — Progress Notes (Signed)
ANTIBIOTIC CONSULT NOTE - INITIAL  Pharmacy Consult for Vancomycin and Zosyn Indication: pneumonia  No Known Allergies  Patient Measurements: Height: 6' (182.9 cm) Weight: 135 lb 9.3 oz (61.5 kg) IBW/kg (Calculated) : 77.6   Vital Signs: Temp: 100 F (37.8 C) (04/29 0812) Temp src: Rectal (04/29 0812) BP: 124/85 mmHg (04/29 1111) Pulse Rate: 107 (04/29 1111) Intake/Output from previous day:   Intake/Output from this shift: Total I/O In: 250 [IV Piggyback:250] Out: -   Labs:  Recent Labs  08/07/12 0725 08/07/12 0735  WBC 13.3*  --   HGB 15.3 16.0  PLT 244  --   CREATININE 1.03 1.00   Estimated Creatinine Clearance: 52.1 ml/min (by C-G formula based on Cr of 1). No results found for this basename: VANCOTROUGH, VANCOPEAK, VANCORANDOM, GENTTROUGH, GENTPEAK, GENTRANDOM, TOBRATROUGH, TOBRAPEAK, TOBRARND, AMIKACINPEAK, AMIKACINTROU, AMIKACIN,  in the last 72 hours   Microbiology: No results found for this or any previous visit (from the past 720 hour(s)).  Medical History: Past Medical History  Diagnosis Date  . Mini stroke   . Irregular heart beat   . Blood clot in vein     left leg dvt  . Dementia   . DVT (deep venous thrombosis)   . Hypercholesteremia   . Arthritis   . Diabetes mellitus   . Infectious colitis 08/07/2012  . Dysrhythmia     Medications:  Scheduled:  . [COMPLETED] adenosine (ADENOCARD) IV  6 mg Intravenous Once  . adenosine      . albuterol  4 puff Inhalation Q4H  . [COMPLETED] albuterol  5 mg Nebulization Once  . albuterol      . aspirin  81 mg Per NG tube Daily  . [START ON 08/08/2012] clopidogrel  75 mg Per NG tube Q breakfast  . [COMPLETED] etomidate      . famotidine (PEPCID) IV  20 mg Intravenous Q12H  . [COMPLETED] fentaNYL      . heparin  5,000 Units Subcutaneous Q8H  . insulin aspart  2-6 Units Subcutaneous Q4H  . [COMPLETED] ipratropium  0.5 mg Nebulization Once  . lidocaine (cardiac) 100 mg/49ml      . metronidazole  500 mg  Intravenous Q6H  . [COMPLETED] midazolam      . [COMPLETED] midazolam      . piperacillin-tazobactam (ZOSYN)  IV  3.375 g Intravenous Once  . piperacillin-tazobactam (ZOSYN)  IV  3.375 g Intravenous Q8H  . rocuronium      . [COMPLETED] sodium chloride  500 mL Intravenous Once  . succinylcholine      . vancomycin  500 mg Oral Q6H  . vancomycin  500 mg Intravenous Q12H  . [COMPLETED] vancomycin  1,000 mg Intravenous Once  . [DISCONTINUED] amiodarone  150 mg Intravenous Once  . [DISCONTINUED] heparin  5,000 Units Subcutaneous Q8H  . [DISCONTINUED] piperacillin-tazobactam (ZOSYN)  IV  3.375 g Intravenous Q8H  . [DISCONTINUED] piperacillin-tazobactam (ZOSYN)  IV  3.375 g Intravenous Q8H   Assessment: 79 yr old male on vancomycin and zosyn for possible PNA.  Patient was on po cipro PTA.  Vancomycin 1gm IV already given in ED.   Goal of Therapy: Vancomycin trough level 15-20 mcg/ml  Plan:  Start Vancomycin 500mg  IV q12hrs and Zosyn 3.375gm IV q8hrs. F/u cultures, renal func.  Wendie Simmer, PharmD, BCPS Clinical Pharmacist  Pager: [phone]  Addendum: received orders to change zosyn to imipenem. - Start Imipenem 250 mg IV q6h - Follow up SCr, UOP, cultures, clinical course and adjust as clinically indicated  Ammara Raj L. Illene Bolus, PharmD, BCPS Clinical Pharmacist Pager: [phone] Pharmacy: [phone] 08/07/2012 11:58 AM

## 2012-08-07 NOTE — ED Notes (Signed)
Amiodarone 150 mg given with Dr. Denton Lank at bedside. No change seen

## 2012-08-07 NOTE — ED Notes (Signed)
Pt remains HR 138, moving around in bed. Labored breathing. Incontinent of stool.

## 2012-08-07 NOTE — ED Notes (Signed)
Raymond Rangel and dr Tyson Alias here to see p,t spoke to dr Beverlee Nims, pt prepped  For intubation 20amidate, 2 mg versed, 50 of fentanyl given at 0846. # 8 et tube placed 24 at the lip. Pt then given 0847 50 more of fentanyl and 2 of versed. 8119 Raymond placed rt subclavian central line and port cxray done for placement of et tube and line. Per [employer] and et tube in good position. Pt placed on vent per resp 24 rate 5 peep 500 TV and 100% o2

## 2012-08-08 ENCOUNTER — Inpatient Hospital Stay (HOSPITAL_COMMUNITY): Payer: Medicare Other

## 2012-08-08 DIAGNOSIS — E119 Type 2 diabetes mellitus without complications: Secondary | ICD-10-CM

## 2012-08-08 DIAGNOSIS — A419 Sepsis, unspecified organism: Secondary | ICD-10-CM

## 2012-08-08 LAB — GLUCOSE, CAPILLARY
Glucose-Capillary: 124 mg/dL — ABNORMAL HIGH (ref 70–99)
Glucose-Capillary: 129 mg/dL — ABNORMAL HIGH (ref 70–99)
Glucose-Capillary: 98 mg/dL (ref 70–99)

## 2012-08-08 LAB — POCT I-STAT 3, ART BLOOD GAS (G3+)
Acid-Base Excess: 2 mmol/L (ref 0.0–2.0)
Bicarbonate: 22.5 mEq/L (ref 20.0–24.0)
O2 Saturation: 98 %
pCO2 arterial: 22.6 mmHg — ABNORMAL LOW (ref 35.0–45.0)
pO2, Arterial: 78 mmHg — ABNORMAL LOW (ref 80.0–100.0)

## 2012-08-08 LAB — CBC
MCV: 83.6 fL (ref 78.0–100.0)
Platelets: 173 10*3/uL (ref 150–400)
RBC: 4.38 MIL/uL (ref 4.22–5.81)
RDW: 15.7 % — ABNORMAL HIGH (ref 11.5–15.5)
WBC: 9.9 10*3/uL (ref 4.0–10.5)

## 2012-08-08 LAB — BLOOD GAS, ARTERIAL
Acid-Base Excess: 2.1 mmol/L — ABNORMAL HIGH (ref 0.0–2.0)
Bicarbonate: 25.5 mEq/L — ABNORMAL HIGH (ref 20.0–24.0)
FIO2: 0.4 %
MECHVT: 540 mL
TCO2: 26.6 mmol/L (ref 0–100)
pCO2 arterial: 35.4 mmHg (ref 35.0–45.0)

## 2012-08-08 LAB — BASIC METABOLIC PANEL
BUN: 13 mg/dL (ref 6–23)
CO2: 24 mEq/L (ref 19–32)
CO2: 23 mEq/L (ref 19–32)
Calcium: 7.7 mg/dL — ABNORMAL LOW (ref 8.4–10.5)
Chloride: 109 mEq/L (ref 96–112)
Creatinine, Ser: 0.97 mg/dL (ref 0.50–1.35)
Creatinine, Ser: 0.9 mg/dL (ref 0.50–1.35)
GFR calc Af Amer: >90 mL/min (ref >90)
Potassium: 3.5 mEq/L (ref 3.5–5.1)

## 2012-08-08 LAB — LEGIONELLA ANTIGEN, URINE: Legionella Antigen, Urine: NEGATIVE

## 2012-08-08 LAB — URINE CULTURE: Colony Count: NO GROWTH

## 2012-08-08 LAB — MAGNESIUM: Magnesium: 1.5 mg/dL (ref 1.5–2.5)

## 2012-08-08 LAB — PHOSPHORUS: Phosphorus: 2 mg/dL — ABNORMAL LOW (ref 2.3–4.6)

## 2012-08-08 LAB — PROTIME-INR: INR: 1.68 — ABNORMAL HIGH (ref 0.00–1.49)

## 2012-08-08 MED ORDER — DEXTROSE 5 % IV SOLN
2.0000 g | INTRAVENOUS | Status: DC
  Administered 2012-08-08 – 2012-08-12 (×5): 2 g via INTRAVENOUS
  Filled 2012-08-08 (×6): qty 2

## 2012-08-08 MED ORDER — ALBUTEROL SULFATE (5 MG/ML) 0.5% IN NEBU: 2.5000 mg | INHALATION_SOLUTION | RESPIRATORY_TRACT | Status: DC | PRN

## 2012-08-08 MED ORDER — HALOPERIDOL LACTATE 5 MG/ML IJ SOLN: 2.0000 mg | Freq: Four times a day (QID) | INTRAMUSCULAR | Status: DC | PRN

## 2012-08-08 MED ORDER — MAGNESIUM SULFATE 40 MG/ML IJ SOLN
2.0000 g | Freq: Once | INTRAMUSCULAR | Status: AC
  Administered 2012-08-08: 2 g via INTRAVENOUS
  Filled 2012-08-08: qty 50

## 2012-08-08 MED ORDER — DEXTROSE 5 % IV SOLN
2.0000 g | Freq: Two times a day (BID) | INTRAVENOUS | Status: DC
  Filled 2012-08-08 (×2): qty 2

## 2012-08-08 MED ORDER — VANCOMYCIN HCL 500 MG IV SOLR
500.0000 mg | Freq: Two times a day (BID) | INTRAVENOUS | Status: DC
  Administered 2012-08-08 – 2012-08-09 (×4): 500 mg via INTRAVENOUS
  Filled 2012-08-08 (×4): qty 500

## 2012-08-08 NOTE — Progress Notes (Signed)
ABG reviewed; Respiratory alkalosis  Vent changes made, decrease respiratory rate and Vt.

## 2012-08-08 NOTE — Progress Notes (Signed)
ANTIBIOTIC CONSULT NOTE - Follow Up  Pharmacy Consult for Vancomycin and Ceftriaxone Indication: pneumonia  No Known Allergies  Patient Measurements: Height: 6' (182.9 cm) Weight: 139 lb 12.4 oz (63.4 kg) IBW/kg (Calculated) : 77.6  Vital Signs: Temp: 100.7 F (38.2 C) (04/30 0800) Temp src: Core (Comment) (04/30 0400) BP: 111/66 mmHg (04/30 0800) Pulse Rate: 100 (04/30 0800) Intake/Output from previous day: 04/29 0701 - 04/30 0700 In: 4577.8 [I.V.:2847.8; NG/GT:180; IV Piggyback:1550] Out: 1605 [Urine:1605] Intake/Output from this shift: Total I/O In: 300 [I.V.:160; NG/GT:90; IV Piggyback:50] Out: 250 [Urine:250]  Labs:  Recent Labs  08/07/12 0725 08/07/12 0735 08/07/12 1200 08/07/12 2137 08/08/12 0400 08/08/12 0500  WBC 13.3*  --   --   --   --  9.9  HGB 15.3 16.0  --   --   --  12.5*  PLT 244  --   --   --   --  173  CREATININE 1.03 1.00 0.98 1.07 0.97  --    Estimated Creatinine Clearance: 55.4 ml/min (by C-G formula based on Cr of 0.97). No results found for this basename: VANCOTROUGH, Leodis Binet, VANCORANDOM, GENTTROUGH, GENTPEAK, GENTRANDOM, TOBRATROUGH, TOBRAPEAK, TOBRARND, AMIKACINPEAK, AMIKACINTROU, AMIKACIN,  in the last 72 hours   Microbiology: Recent Results (from the past 720 hour(s))  CULTURE, BLOOD (ROUTINE X 2)     Status: None   Collection Time    08/07/12  7:48 AM      Result Value Range Status   Specimen Description BLOOD RIGHT ARM   Final   Special Requests BOTTLES DRAWN AEROBIC ONLY 8CC   Final   Culture  Setup Time 08/07/2012 18:28   Final   Culture     Final   Value:        BLOOD CULTURE RECEIVED NO GROWTH TO DATE CULTURE WILL BE HELD FOR 5 DAYS BEFORE ISSUING A FINAL NEGATIVE REPORT   Report Status PENDING   Incomplete  CULTURE, BLOOD (ROUTINE X 2)     Status: None   Collection Time    08/07/12  7:52 AM      Result Value Range Status   Specimen Description BLOOD RIGHT HAND   Final   Special Requests BOTTLES DRAWN AEROBIC ONLY 5CC    Final   Culture  Setup Time 08/07/2012 18:27   Final   Culture     Final   Value:        BLOOD CULTURE RECEIVED NO GROWTH TO DATE CULTURE WILL BE HELD FOR 5 DAYS BEFORE ISSUING A FINAL NEGATIVE REPORT   Report Status PENDING   Incomplete  CULTURE, RESPIRATORY (NON-EXPECTORATED)     Status: None   Collection Time    08/07/12  9:55 AM      Result Value Range Status   Specimen Description TRACHEAL ASPIRATE   Final   Special Requests Immunocompromised   Final   Gram Stain PENDING   Incomplete   Culture NO GROWTH   Final   Report Status PENDING   Incomplete  MRSA PCR SCREENING     Status: None   Collection Time    08/07/12 10:32 AM      Result Value Range Status   MRSA by PCR NEGATIVE  NEGATIVE Final   Comment:            The GeneXpert MRSA Assay (FDA     approved for NASAL specimens     only), is one component of a     comprehensive MRSA colonization     surveillance  program. It is not     intended to diagnose MRSA     infection nor to guide or     monitor treatment for     MRSA infections.    Medical History: Past Medical History  Diagnosis Date  . Mini stroke   . Irregular heart beat   . Blood clot in vein     left leg dvt  . Dementia   . DVT (deep venous thrombosis)   . Hypercholesteremia   . Arthritis   . Diabetes mellitus   . Infectious colitis 08/07/2012  . Dysrhythmia    Assessment: 79 yr old male on multiple antibiotics for hx. of colitis and possible pneumonia.  His culture data is all negative thus far.  He is still spiking temp. 103.1 >> 100.7.  Antibiotics are being streamlined to include Ceftriaxone and Vancomycin.  He has been receiving IV Vancomycin 500mg  every 12 hours which is based on weight since he is < IBW.      Goal of Therapy: Vancomycin trough level 15-20 mcg/ml  Plan:  1.  Will continue IV Vancomycin 500 mg IV q12 hours  2.  Begin IV Ceftriaxone 2 gm every 24 hours 3.  F/u cultures, renal func.  Nadara Mustard, PharmD., MS Clinical  Pharmacist Pager:  [phone] Thank you for allowing pharmacy to be part of this patients care team. 08/08/2012 9:58 AM

## 2012-08-08 NOTE — Progress Notes (Addendum)
PULMONARY  / CRITICAL CARE MEDICINE  Name: Raymond Rangel MRN: [id number] DOB: 12-16-1931    ADMISSION DATE:  08/07/2012   PRIMARY SERVICE: PCCM  CHIEF COMPLAINT:  Shock./resp failure   BRIEF PATIENT DESCRIPTION:  This is a 72 YOM who resides at SNF. Admitted on 4/29 w/ weakness, dehydration, sepsis and respiratory failure in setting of presumed infectious colitis and PNA.    SIGNIFICANT EVENTS / STUDIES:  4/29- ETT, distress  LINES / TUBES OETT 4/29>>>4/30 Right  CVL 4/29>>>  CULTURES: BCX2 4/29>>> UC 4/29>>> Sputum 4/29>>> U strep 4/29>>>neg U legionella 4/29>>> c diff PCR 4/29>>>d/c  norovirus PCR 4/29>>>  ANTIBIOTICS: IV vanc 4/29>>> IV ceftriaxone 4/30>> Zosyn 4/29>>>4/29 PO Vanc 4/29>>>4/30 Flagyl 4/29>>>4/30  Imaging:  4/29 CXR: RLL airspace disease, C/W PNA. ETT and right IJ in good position.  4/30 CXR: ETT in place, RIJ, slight increase in RLL infiltrate  HISTORY OF PRESENT ILLNESS:   This is a 71 YOM who resides at a SNF due to vascular dementia and prior CVA. Per nursing home notes a there has been a GI virus w/ diarrhea going around the SNF. Notes indicate that pt did have similar symptoms. Then there is an office phone call that indicates that on 4/23 the pt had become progressively confused. The confusion continued to worsen and was associated w/ increased work of breathing over the following days. In this time he was treated for possible  UTI. His condition continued to worsen w/ marked increase in WOB which is what brought him to the ER on 4/29. On presentation he was confused, lethargic, Meets SIRS criteria w/ RLL infiltrate on CXR. He was admitted to the [facility] service for further rx.   SUBJECTIVE: tolerating sbt well this AM, no diarrhea overnight, off pressors  VITAL SIGNS: Temp:  [98.5 F (36.9 C)-103.1 F (39.5 C)] 100.7 F (38.2 C) (04/30 0743) Pulse Rate:  [52-123] 103 (04/30 0743) Resp:  [16-36] 16 (04/30 0743) BP: (86-167)/(51-117) 100/69  mmHg (04/30 0743) SpO2:  [87 %-100 %] 98 % (04/30 0743) FiO2 (%):  [40 %-100 %] 40 % (04/30 0744) Weight:  [135 lb 9.3 oz (61.5 kg)-139 lb 12.4 oz (63.4 kg)] 139 lb 12.4 oz (63.4 kg) (04/30 0500) HEMODYNAMICS: CVP:  [11 mmHg] 11 mmHg VENTILATOR SETTINGS: Vent Mode:  [-] CPAP;PSV FiO2 (%):  [40 %-100 %] 40 % Set Rate:  [18 bmp-24 bmp] 18 bmp Vt Set:  [500 mL-620 mL] 540 mL PEEP:  [5 cmH20] 5 cmH20 Pressure Support:  [5 cmH20] 5 cmH20 Plateau Pressure:  [15 cmH20-29 cmH20] 21 cmH20 INTAKE / OUTPUT: Intake/Output     04/29 0701 - 04/30 0700 04/30 0701 - 05/01 0700   I.V. (mL/kg) 1667.8 (26.3)    NG/GT 180    IV Piggyback 1550    Total Intake(mL/kg) 3397.8 (53.6)    Urine (mL/kg/hr) 1605 (1.1)    Total Output 1605     Net +1792.8            PHYSICAL EXAMINATION: General:  Chronically ill appearing AAM, now intubated and sedated.  Neuro:  Oriented x 1, generalized weakness.  HEENT:  Poor dentition. MMM, ET in place  Cardiovascular:  RRR, no murmurs Lungs:  Crackles in Right base, scattered rhonchi Abdomen:  Soft, + bowel sounds, no OM Musculoskeletal:  Intact  Skin:  intact  LABS:  Recent Labs Lab 08/07/12 1200 08/07/12 2137 08/08/12 0400  NA 140 143 142  K 2.6* 3.9 3.5  CL 108 109 108  CO2 18*  23 24  BUN 16 15 13   CREATININE 0.98 1.07 1.61  GLUCOSE 202* 126* 111*    Recent Labs Lab 08/07/12 0725 08/07/12 0735 08/08/12 0500  HGB 15.3 16.0 12.5*  HCT 43.7 47.0 36.6*  WBC 13.3*  --  9.9  PLT 244  --  173    Recent Labs Lab 08/07/12 0725 08/07/12 0748 08/07/12 1200 08/08/12 0500  PROCALCITON 9.01  --   --   --   WBC 13.3*  --   --  9.9  LATICACIDVEN  --  8.2* 4.7*  --      Recent Labs Lab 08/07/12 1527 08/07/12 1926 08/08/12 08/08/12 0406 08/08/12 0716  GLUCAP 171* 117* 129* 107* 124*    ASSESSMENT / PLAN:  PULMONARY A:acute respiratory failure.  Pneumonia (HCAP)  P:   -PRVC -sbt daily -intermittent sedation -plan to extubate  today -trend cxr -See ID section   CARDIOVASCULAR A: SIRS/sepsis 2/2 colitis vs PNA. Lactic acid downtrend 4<<8.  P:  -cvp goal 10-12 -cont fluid hydration  RENAL A:  Mixed gap/NAG acidosis 4/29. Lactic acid downtrending 4<<8. Gap closed 4/30      Hypomagnesemia  P:   -d/c bicarb drip Replace Mg Trend bmet in afternoon  GASTROINTESTINAL A:  Diarrhea: concern for infectious colitis P:   norovirus NPO H2 blockade  May need swallow eval  HEMATOLOGIC A: h/o DVt , CBC 12. No active bleeding  P:  [place] heparin  Trend cbc INR check for anticoagulation  INFECTIOUS A:  HCAP      Sepsis. Pct 9 P:   Pan culture/broad spec abx D/c imipenum, flagyl, oral vanc IV vanc and ceftriaxone  ENDOCRINE A:  DM/ hyperglycemia. Cortisol 34.  P:   ssi No need for steroids  NEUROLOGIC A:  H/o CVA      H/o vascular dementia  P:   Supportive care  D/c sedation Avoid benzo  TODAY'S SUMMARY: Family wants to pursue medical management but no aggressive recusitation. Pt no diarrhea overnight doubt etiology sepsis GI most likely CAP vs aspiration PNA. Plan to extubate.   Christen Bame, MD PGY-1 Pgr: [phone]  08/08/2012, 7:54 AM   I have interviewed and examined the patient and reviewed the database. I have formulated the assessment and plan as reflected in the note above with amendments made by me. 30 mins of direct critical care time provided  Passed SBT and extubated. Looks ok initially. Will monitor closely  Billy Fischer, MD;  PCCM service; Mobile [phone]

## 2012-08-08 NOTE — Progress Notes (Signed)
Clinical Social Work Department BRIEF PSYCHOSOCIAL ASSESSMENT 08/08/2012  Patient:  Raymond Rangel, Raymond Rangel     Account Number:  [id number]     Admit date:  08/07/2012  Clinical Social Worker:  Margaree Mackintosh  Date/Time:  08/08/2012 12:00 M  Referred by:  Care Management  Date Referred:  08/08/2012 Referred for  SNF Placement   Other Referral:   Pt admitted from [facility].   Interview type:  Family Other interview type:    PSYCHOSOCIAL DATA Living Status:  FACILITY Admitted from facility:  [facility] [place] Level of care:  Skilled Nursing Facility Primary support name:  Kassem Kibbe: [phone] Primary support relationship to patient:  CHILD, ADULT Degree of support available:   Adequate.    CURRENT CONCERNS Current Concerns  Post-Acute Placement   Other Concerns:    SOCIAL WORK ASSESSMENT / PLAN Clinical Social Worker received referral indicating pt is from [facility].  CSW reviewed chart and phoned pt's son. Son stated he has hearing difficulties and cannot communicate effectively via telephone; son stated he was on his way to hospital.  CSW met with son at bedside.  CSW introduced self, explained role, and provided support.  CSW provided active listening.  Son confirmed pt is from SNF[facility].  Son agreeable to pt's information being sent to facility.  Son voiced concerns with facility.  CSW encouraged son to communicate concerns with facility and attempt to problem solve.  CSW offered to begin new bed search; son stated interest in pt returning to [facility].  CSW to continue to follow and assist as needed.   Assessment/plan status:  Information/Referral to [facility] Other assessment/ plan:   Information/referral to community resources:   SNF    PATIENT'S/FAMILY'S RESPONSE TO PLAN OF CARE: Pt currently unable to participate due to medical condition.  Son thanked CSW for intervention.

## 2012-08-08 NOTE — Care Management Note (Signed)
    Page 1 of 1   08/08/2012     11:33:18 AM   CARE MANAGEMENT NOTE 08/08/2012  Patient:  Raymond Rangel, Raymond Rangel   Account Number:  [id number]  Date Initiated:  08/08/2012  Documentation initiated by:  Avie Arenas  Subjective/Objective Assessment:   Resp failure - intubated on admission.  From [facility].  Prior to this home with [facility] after discharge from CIR post stroke.     Action/Plan:   Anticipated DC Date:  08/13/2012   Anticipated DC Plan:  SKILLED NURSING FACILITY  In-house referral  Clinical Social Worker      DC Planning Services  CM consult      Choice offered to / List presented to:             Status of service:  In process, will continue to follow Medicare Important Message given?   (If response is "NO", the following Medicare IM given date fields will be blank) Date Medicare IM given:   Date Additional Medicare IM given:    Discharge Disposition:    Per UR Regulation:  Reviewed for med. necessity/level of care/duration of stay  If discussed at Long Length of Stay Meetings, dates discussed:    Comments:  Contact:  Levandowski-Collins,[name] Daughter [phone]                [facility] Spouse [phone]   Kenric, Ginger [phone]   [phone]  08-08-12 11:30am Avie Arenas, RNBSN - [phone] Son reports did not like the care at [facility] and patient has just been there a short time. Prior to SNF was home with [facility].   SW consult placed. Patient now extubated - PT/OT and speech to eval.  CM will continue to follow for discharge plan.

## 2012-08-08 NOTE — Evaluation (Signed)
Clinical/Bedside Swallow Evaluation Patient Details  Name: Raymond Rangel MRN: [id number] Date of Birth: 12-31-1931  Today's Date: 08/08/2012 Time: 4098-1191 SLP Time Calculation (min): 15 min  Past Medical History:  Past Medical History  Diagnosis Date  . Mini stroke   . Irregular heart beat   . Blood clot in vein     left leg dvt  . Dementia   . DVT (deep venous thrombosis)   . Hypercholesteremia   . Arthritis   . Diabetes mellitus   . Infectious colitis 08/07/2012  . Dysrhythmia    Past Surgical History:  Past Surgical History  Procedure Laterality Date  . Left arm    . Rotator cuff repair  2000(L) 1999 (R)   HPI:  This is a 79 year old SNF reisden admitted with progressive confusion. Of note, there has been a GI virus w/ diarrhea going around the SNF. Notes indicate that pt did have similar symptoms. Then there is an office phone call that indicates that on 4/23 the pt had become progressively confused. The confusion continued to worsen and was associated w/ increased work of breathing over the following days. In this time he was treated for possible UTI. His condition continued to worsen w/ marked increase in WOB which is what brought him to the ER on 4/29. On presentation he was confused, lethargic, Meets SIRS criteria w/ RLL infiltrate on CXR. Intubated 4/29-4/30.    Assessment / Plan / Recommendation Clinical Impression  Patient presents with s/s of decreased airway protection at baseline as well as with conservative po trials characterized by multiple swallows suggestive of pharyngeal residuals as well as throat clearing and wet vocal quality indicative of penetration and/or aspiration. Suspect exacerbated dysphagia from baseline secondary to recent, although brief, intubation and general deconditioning. Prognosis for improvement good with time off ventilator. However, in light of h/o dysphagia with silent aspiration and RLL infiltrate seen on admission CXR (cannot r/o  aspiration during vomitting given GI virus?), recommend proceeding with objective evaluation when demonstrating increased s/s of airway protection. Will f/u am 5/1.     Aspiration Risk  Moderate    Diet Recommendation NPO   Medication Administration: Via alternative means    Other  Recommendations Recommended Consults: MBS Oral Care Recommendations: Oral care QID   Follow Up Recommendations  Skilled Nursing facility    Frequency and Duration min 2x/week  2 weeks   Pertinent Vitals/Pain None reported    SLP Swallow Goals Goal #3: Patient will demonstrate clear vocal quality at baseline indicative of improved secretion management to determine readiness for proceeding with objective evaluation.  Swallow Study Goal #3 - Progress:  (new goal)   Swallow Study    General HPI: This is a 79 year old SNF reisden admitted with progressive confusion. Of note, there has been a GI virus w/ diarrhea going around the SNF. Notes indicate that pt did have similar symptoms. Then there is an office phone call that indicates that on 4/23 the pt had become progressively confused. The confusion continued to worsen and was associated w/ increased work of breathing over the following days. In this time he was treated for possible UTI. His condition continued to worsen w/ marked increase in WOB which is what brought him to the ER on 4/29. On presentation he was confused, lethargic, Meets SIRS criteria w/ RLL infiltrate on CXR. Intubated 4/29-4/30.  Type of Study: Bedside swallow evaluation Previous Swallow Assessment: MBS on previous admission 05/28/12-silent aspiration thin, recommended pureed solids, thin liquids  with precautions.  Diet Prior to this Study: NPO Temperature Spikes Noted: No Respiratory Status: Supplemental O2 delivered via (comment) (nasal cannula) History of Recent Intubation: Yes Length of Intubations (days): 1 days Date extubated: 08/08/12 Behavior/Cognition: Lethargic Oral Cavity -  Dentition: Missing dentition;Poor condition Self-Feeding Abilities: Needs assist Patient Positioning: Upright in bed Baseline Vocal Quality: Hoarse;Low vocal intensity;Wet (c/o sore throat) Volitional Cough: Weak Volitional Swallow: Able to elicit    Oral/Motor/Sensory Function Overall Oral Motor/Sensory Function: Appears within functional limits for tasks assessed   Ice Chips Ice chips: Impaired Presentation: Spoon Pharyngeal Phase Impairments: Wet Vocal Quality;Throat Clearing - Immediate (multiple swallows suggestive of pharyngeal residue)   Thin Liquid Thin Liquid: Not tested    Nectar Thick Nectar Thick Liquid: Not tested   Honey Thick Honey Thick Liquid: Not tested   Puree Puree: Not tested   Solid   GO   Raymond Hafer Kane MA, CCC-SLP [phone]  Solid: Not tested       Raymond Rangel 08/08/2012,5:17 PM

## 2012-08-08 NOTE — Progress Notes (Signed)
Hypomagnesemia   Mg replaced  

## 2012-08-08 NOTE — Procedures (Signed)
Extubation Procedure Note  Patient Details:   Name: Raymond Rangel DOB: 11/19/1931 MRN: [id number]   Airway Documentation:     Evaluation  O2 sats: stable throughout Complications: No apparent complications Patient did tolerate procedure well. Bilateral Breath Sounds: Clear Suctioning: Airway No  Ave Filter 08/08/2012, 9:43 AM

## 2012-08-09 ENCOUNTER — Inpatient Hospital Stay (HOSPITAL_COMMUNITY): Payer: Medicare Other

## 2012-08-09 DIAGNOSIS — F0390 Unspecified dementia without behavioral disturbance: Secondary | ICD-10-CM

## 2012-08-09 LAB — BASIC METABOLIC PANEL
BUN: 10 mg/dL (ref 6–23)
CO2: 22 mEq/L (ref 19–32)
GFR calc non Af Amer: 79 mL/min — ABNORMAL LOW (ref >90)
Glucose, Bld: 83 mg/dL (ref 70–99)
Potassium: 3.7 mEq/L (ref 3.5–5.1)

## 2012-08-09 MED ORDER — DONEPEZIL HCL 10 MG PO TABS
10.0000 mg | ORAL_TABLET | Freq: Every day | ORAL | Status: DC
  Administered 2012-08-09 – 2012-08-13 (×5): 10 mg via ORAL
  Filled 2012-08-09 (×7): qty 1

## 2012-08-09 MED ORDER — QUETIAPINE FUMARATE 25 MG PO TABS
25.0000 mg | ORAL_TABLET | Freq: Two times a day (BID) | ORAL | Status: DC
  Administered 2012-08-09 – 2012-08-14 (×10): 25 mg via ORAL
  Filled 2012-08-09 (×12): qty 1

## 2012-08-09 MED ORDER — ATORVASTATIN CALCIUM 40 MG PO TABS
40.0000 mg | ORAL_TABLET | Freq: Every day | ORAL | Status: DC
  Administered 2012-08-10 – 2012-08-13 (×4): 40 mg via ORAL
  Filled 2012-08-09 (×5): qty 1

## 2012-08-09 MED ORDER — ENSURE COMPLETE PO LIQD
237.0000 mL | Freq: Three times a day (TID) | ORAL | Status: DC
  Administered 2012-08-09 – 2012-08-14 (×14): 237 mL via ORAL

## 2012-08-09 MED ORDER — VANCOMYCIN HCL IN DEXTROSE 1-5 GM/200ML-% IV SOLN
1000.0000 mg | Freq: Two times a day (BID) | INTRAVENOUS | Status: DC
  Administered 2012-08-10 – 2012-08-11 (×3): 1000 mg via INTRAVENOUS
  Filled 2012-08-09 (×4): qty 200

## 2012-08-09 MED ORDER — FAMOTIDINE 20 MG PO TABS
20.0000 mg | ORAL_TABLET | Freq: Two times a day (BID) | ORAL | Status: DC
  Filled 2012-08-09 (×2): qty 1

## 2012-08-09 NOTE — Procedures (Signed)
Objective Swallowing Evaluation:    Patient Details  Name: Raymond Rangel MRN: [id number] Date of Birth: 1931-12-12  Today's Date: 08/09/2012 Time: 1200-1320 SLP Time Calculation (min): 80 min  Past Medical History:  Past Medical History  Diagnosis Date  . Mini stroke   . Irregular heart beat   . Blood clot in vein     left leg dvt  . Dementia   . DVT (deep venous thrombosis)   . Hypercholesteremia   . Arthritis   . Diabetes mellitus   . Infectious colitis 08/07/2012  . Dysrhythmia    Past Surgical History:  Past Surgical History  Procedure Laterality Date  . Left arm    . Rotator cuff repair  2000(L) 1999 (R)   HPI:  This is a 79 year old SNF reisden admitted with progressive confusion. Of note, there has been a GI virus w/ diarrhea going around the SNF. Notes indicate that pt did have similar symptoms. Then there is an office phone call that indicates that on 4/23 the pt had become progressively confused. The confusion continued to worsen and was associated w/ increased work of breathing over the following days. In this time he was treated for possible UTI. His condition continued to worsen w/ marked increase in WOB which is what brought him to the ER on 4/29. On presentation he was confused, lethargic, Meets SIRS criteria w/ RLL infiltrate on CXR. Intubated 4/29-4/30.      Assessment / Plan / Recommendation Clinical Impression  Dysphagia Diagnosis: Mild cervical esophageal phase dysphagia;Mild pharyngeal phase dysphagia Clinical impression: Pt presents with overall functional oral and oropharyngeal swallow. There was no penetration/aspiration despite highly impulsive self feeding behavior. Intermittently there was premature spillage/delay in initiation to the pyriform sinuses, but again, no penetration/aspiration events. Mild pharyngeal residue remaining due to reduced passage of bolus through UES secondary to appearance of cervical osteophytes. Recommend pt intiate a regular diet  and thin liquids with full supervision initiatlly to ensure tolerance. SLP will f/u also to verify tolerance.     Treatment Recommendation  Therapy as outlined in treatment plan below    Diet Recommendation Regular;Thin liquid   Liquid Administration via: Cup;Straw Medication Administration: Whole meds with liquid Supervision: Patient able to self feed;Full supervision/cueing for compensatory strategies Compensations: Slow rate;Small sips/bites Postural Changes and/or Swallow Maneuvers: Seated upright 90 degrees    Other  Recommendations Oral Care Recommendations: Oral care BID   Follow Up Recommendations  Skilled Nursing facility    Frequency and Duration min 2x/week  1 week   Pertinent Vitals/Pain NA    SLP Swallow Goals Patient will utilize recommended strategies during swallow to increase swallowing safety with: Minimal cueing Swallow Study Goal #2 - Progress: Progressing toward goal   General HPI: This is a 79 year old SNF reisden admitted with progressive confusion. Of note, there has been a GI virus w/ diarrhea going around the SNF. Notes indicate that pt did have similar symptoms. Then there is an office phone call that indicates that on 4/23 the pt had become progressively confused. The confusion continued to worsen and was associated w/ increased work of breathing over the following days. In this time he was treated for possible UTI. His condition continued to worsen w/ marked increase in WOB which is what brought him to the ER on 4/29. On presentation he was confused, lethargic, Meets SIRS criteria w/ RLL infiltrate on CXR. Intubated 4/29-4/30.  Reason for Referral: Objectively evaluate swallowing function Previous Swallow Assessment: MBS on previous  admission 05/28/12-silent aspiration thin, recommended pureed solids, thin liquids with precautions.  Diet Prior to this Study: NPO Temperature Spikes Noted: Yes Respiratory Status: Supplemental O2 delivered via  (comment) History of Recent Intubation: Yes Length of Intubations (days): 1 days Date extubated: 08/08/12 Behavior/Cognition: Alert;Cooperative Oral Cavity - Dentition: Missing dentition;Poor condition Oral Motor / Sensory Function: Within functional limits Self-Feeding Abilities: Able to feed self Patient Positioning: Upright in chair Baseline Vocal Quality: Clear Volitional Cough: Strong Volitional Swallow: Able to elicit Anatomy: Within functional limits Pharyngeal Secretions: Not observed secondary MBS    Reason for Referral Objectively evaluate swallowing function   Oral Phase Oral Preparation/Oral Phase Oral Phase: WFL   Pharyngeal Phase Pharyngeal Phase Pharyngeal Phase: Impaired Pharyngeal - Thin Pharyngeal - Thin Cup: Pharyngeal residue - pyriform sinuses Pharyngeal - Thin Straw: Pharyngeal residue - pyriform sinuses Pharyngeal - Solids Pharyngeal - Puree: Within functional limits Pharyngeal - Regular: Within functional limits Pharyngeal - Pill: Within functional limits  Cervical Esophageal Phase    GO    Cervical Esophageal Phase Cervical Esophageal Phase: Impaired Cervical Esophageal Phase - Comment Cervical Esophageal Comment: decreased opening of UES due to appearance of cervical osteophytes, mild pyriform residuals.         Harlon Ditty, MA CCC-SLP [phone]  Claudine Mouton 08/09/2012, 2:05 PM

## 2012-08-09 NOTE — Progress Notes (Signed)
INITIAL NUTRITION ASSESSMENT  DOCUMENTATION CODES Per approved criteria  -Underweight -Severe malnutrition in the context of chronic illness   INTERVENTION: Ensure Complete po TID, each supplement provides 350 kcal and 13 grams of protein.  NUTRITION DIAGNOSIS: Malnutrition related to inadequate oral intake as evidenced by severe loss of subcutaneous fat and muscle.   Goal: Intake to meet >90% of estimated nutrition needs.  Monitor:  PO intake, labs, weight trend.  Reason for Assessment: Low [place]  79 y.o. male  Admitting Dx: Acute respiratory failure  ASSESSMENT: Patient admitted on 4/29 with weakness, dehydration, sepsis, and respiratory failure in the setting of presumed infectious colitis and PNA.  Patient reports poor intake PTA, says "I just  don't eat like I should." Patient is underweight with BMI=18.4.  Nutrition Focused Physical Exam:  Subcutaneous Fat:  Orbital Region: WNL Upper Arm Region: severe depletion Thoracic and Lumbar Region: NA  Muscle:  Temple Region: mild-moderate depletion Clavicle Bone Region: severe depletion Clavicle and Acromion Bone Region: severe depletion Scapular Bone Region: NA Dorsal Hand: WNL Patellar Region: WNL Anterior Thigh Region: WNL Posterior Calf Region: WNL  Edema: none  Pt meets criteria for severe MALNUTRITION in the context of chronic illness as evidenced by severe subcutaneous fat loss and severe muscle loss.  Height: Ht Readings from Last 1 Encounters:  08/07/12 6' (1.829 m)    Weight: Wt Readings from Last 1 Encounters:  08/09/12 135 lb 9.3 oz (61.5 kg)    Ideal Body Weight: 80.9 kg  % Ideal Body Weight: 76%  Wt Readings from Last 10 Encounters:  08/09/12 135 lb 9.3 oz (61.5 kg)  07/06/12 136 lb (61.689 kg)  06/20/12 136 lb 11 oz (62 kg)  05/27/12 136 lb 3.2 oz (61.78 kg)  07/29/11 136 lb (61.689 kg)  06/29/11 137 lb (62.143 kg)  06/21/11 126 lb 9.6 oz (57.425 kg)  06/16/11 127 lb (57.607 kg)   06/13/11 133 lb (60.328 kg)  06/10/11 133 lb (60.328 kg)    Usual Body Weight: 136 lb  % Usual Body Weight: 99%  BMI:  Body mass index is 18.38 kg/(m^2). underweight  Estimated Nutritional Needs: Kcal: 1700-1900 Protein: 85-100 gm Fluid: 1.7-1.9 L  Skin: stage 2 pressure ulcer on sacrum  Diet Order: General  EDUCATION NEEDS: -Education not appropriate at this time   Intake/Output Summary (Last 24 hours) at 08/09/12 1516 Last data filed at 08/09/12 1400  Gross per 24 hour  Intake    720 ml  Output   1452 ml  Net   -732 ml    Last BM: diarrhea 5/1   Labs:   Recent Labs Lab 08/08/12 0400 08/08/12 0500 08/08/12 1945 08/09/12 0252  NA 142  --  142 142  K 3.5  --  3.5 3.7  CL 108  --  109 110  CO2 24  --  23 22  BUN 13  --  10 10  CREATININE 0.97  --  0.90 0.89  CALCIUM 7.7*  --  8.1* 8.2*  MG  --  1.5  --   --   PHOS  --  2.0*  --   --   GLUCOSE 111*  --  92 83    CBG (last 3)   Recent Labs  08/08/12 0716 08/08/12 1104 08/08/12 1556  GLUCAP 124* 98 86    Scheduled Meds: . aspirin  81 mg Per NG tube Daily  . cefTRIAXone (ROCEPHIN)  IV  2 g Intravenous Q24H  . clopidogrel  75 mg  Per NG tube Q breakfast  . heparin  5,000 Units Subcutaneous Q8H  . vancomycin  500 mg Intravenous Q12H    Continuous Infusions:   Past Medical History  Diagnosis Date  . Mini stroke   . Irregular heart beat   . Blood clot in vein     left leg dvt  . Dementia   . DVT (deep venous thrombosis)   . Hypercholesteremia   . Arthritis   . Diabetes mellitus   . Infectious colitis 08/07/2012  . Dysrhythmia     Past Surgical History  Procedure Laterality Date  . Left arm    . Rotator cuff repair  2000(L) 1999 (R)    Joaquin Courts, RD, LDN, CNSC Pager [phone] After Hours Pager [phone]

## 2012-08-09 NOTE — H&P (Signed)
Report called to [facility].  Transferred to 6N room 3. with RN.  Transported in bed on [place] 2L.  VSS. No distressed noted.  Family notified of transfer.

## 2012-08-09 NOTE — Progress Notes (Signed)
PULMONARY  / CRITICAL CARE MEDICINE  Name: Raymond Rangel MRN: [id number] DOB: 18-Apr-1931    ADMISSION DATE:  08/07/2012   PRIMARY SERVICE: PCCM  CHIEF COMPLAINT:  Shock./resp failure   BRIEF PATIENT DESCRIPTION:  This is a 46 YOM who resides at SNF. Admitted on 4/29 w/ weakness, dehydration, sepsis and respiratory failure in setting of presumed infectious colitis and PNA.    SIGNIFICANT EVENTS / STUDIES:  4/29- ETT, distress 5/1 - Passed SLP eval. Reg diet with thin liquids   LINES / TUBES ETT 4/29>>>4/30 Right [place] CVL 4/29 >> 5/1  CULTURES: UC 4/29>>> NEG Sputum 4/29>>> NOF U strep 4/29>>>neg U legionella 4/29>>> NEG BCX2 4/29 >>   ANTIBIOTICS: Zosyn 4/29>>>4/29 PO Vanc 4/29>>>4/30 Flagyl 4/29>>>4/30 IV vanc 4/29>>> IV ceftriaxone 4/30>>  Imaging: No new CXR   SUBJECTIVE: No distress. No new complaints  VITAL SIGNS: Temp:  [97.8 F (36.6 C)-101.3 F (38.5 C)] 100.5 F (38.1 C) (05/01 1000) Pulse Rate:  [34-120] 62 (05/01 1600) Resp:  [16-25] 25 (05/01 1600) BP: (92-141)/(56-104) 92/56 mmHg (05/01 1600) SpO2:  [93 %-100 %] 97 % (05/01 1600) Weight:  [61.5 kg (135 lb 9.3 oz)] 61.5 kg (135 lb 9.3 oz) (05/01 0500) HEMODYNAMICS:   VENTILATOR SETTINGS:   INTAKE / OUTPUT: Intake/Output     04/30 0701 - 05/01 0700 05/01 0701 - 05/02 0700   I.V. (mL/kg) 590 (9.6) 160 (2.6)   NG/GT 90    IV Piggyback 400 150   Total Intake(mL/kg) 1080 (17.6) 310 (5)   Urine (mL/kg/hr) 2250 (1.5) 251 (0.4)   Stool  1 (0)   Total Output 2250 252   Net -1170 +58        Stool Occurrence 2 x      PHYSICAL EXAMINATION: General: NAD Neuro: nonfocal HEENT:  Poor dention. Otherwise WNL Cardiovascular:  RRR, no murmurs Lungs:  Crackles in Right base, scattered rhonchi Abdomen:  Soft, + bowel sounds, no OM Ext: no edema, warm   LABS:  Recent Labs Lab 08/08/12 0400 08/08/12 1945 08/09/12 0252  NA 142 142 142  K 3.5 3.5 3.7  CL 108 109 110  CO2 24 23 22   BUN 13 10 10    CREATININE 0.97 0.90 0.89  GLUCOSE 111* 92 83    Recent Labs Lab 08/07/12 0725 08/07/12 0735 08/08/12 0500  HGB 15.3 16.0 12.5*  HCT 43.7 47.0 36.6*  WBC 13.3*  --  9.9  PLT 244  --  173    Recent Labs Lab 08/07/12 0725 08/07/12 0748 08/07/12 1200 08/08/12 0500  PROCALCITON 9.01  --   --   --   WBC 13.3*  --   --  9.9  LATICACIDVEN  --  8.2* 4.7*  --      Recent Labs Lab 08/08/12 08/08/12 0406 08/08/12 0716 08/08/12 1104 08/08/12 1556  GLUCAP 129* 107* 124* 98 86    No new CXR  ASSESSMENT / PLAN:  PULMONARY A:acute respiratory failure, resolved.   P:   Transfer to med-surg   CARDIOVASCULAR A: SIRS/sepsis - likely PNA.  P:  -D/C CVP -Monitor BP  RENAL A:  Mixed gap/NAG acidosis 4/29, resolved.  P:   -Monitor BMET  GASTROINTESTINAL A:  Diarrhea - none since admission P:   Advance diet per SLP recs   HEMATOLOGIC A: h/o DVt , CBC 12. No active bleeding  P:  Cont [place] heparin    INFECTIOUS A:  Suspected HCAP P:   Cont vanc and ceftriaxone  ENDOCRINE A:  Documented h/o DM - minimal hyperglycemia. Wasn't on any agents PTA P:   D/C SSI  NEUROLOGIC A:  H/o CVA      H/o vascular dementia  P:   Consider restart of donazepil soon   TODAY'S SUMMARY: transfer to med-surg. PCCM will cont to follow. Will get PA/lat CXR 5/2 and need to decide on duration of abx  Billy Fischer, MD ; [facility] service Mobile [phone].  After 5:30 PM or weekends, call [phone]

## 2012-08-09 NOTE — Progress Notes (Signed)
ANTIBIOTIC CONSULT NOTE - Follow Up  Pharmacy Consult for Vancomycin and Ceftriaxone Indication: pneumonia  No Known Allergies   Labs:  Recent Labs  08/07/12 0725 08/07/12 0735  08/08/12 0400 08/08/12 0500 08/08/12 1945 08/09/12 0252  WBC 13.3*  --   --   --  9.9  --   --   HGB 15.3 16.0  --   --  12.5*  --   --   PLT 244  --   --   --  173  --   --   CREATININE 1.03 1.00  < > 0.97  --  0.90 0.89  < > = values in this interval not displayed. Estimated Creatinine Clearance: 58.5 ml/min (by C-G formula based on Cr of 0.89).  Recent Labs  08/09/12 2114  VANCOTROUGH 9.5*     Microbiology: Recent Results (from the past 720 hour(s))  CULTURE, BLOOD (ROUTINE X 2)     Status: None   Collection Time    08/07/12  7:48 AM      Result Value Range Status   Specimen Description BLOOD RIGHT ARM   Final   Special Requests BOTTLES DRAWN AEROBIC ONLY 8CC   Final   Culture  Setup Time 08/07/2012 18:28   Final   Culture     Final   Value:        BLOOD CULTURE RECEIVED NO GROWTH TO DATE CULTURE WILL BE HELD FOR 5 DAYS BEFORE ISSUING A FINAL NEGATIVE REPORT   Report Status PENDING   Incomplete  CULTURE, BLOOD (ROUTINE X 2)     Status: None   Collection Time    08/07/12  7:52 AM      Result Value Range Status   Specimen Description BLOOD RIGHT HAND   Final   Special Requests BOTTLES DRAWN AEROBIC ONLY 5CC   Final   Culture  Setup Time 08/07/2012 18:27   Final   Culture     Final   Value:        BLOOD CULTURE RECEIVED NO GROWTH TO DATE CULTURE WILL BE HELD FOR 5 DAYS BEFORE ISSUING A FINAL NEGATIVE REPORT   Report Status PENDING   Incomplete  CULTURE, RESPIRATORY (NON-EXPECTORATED)     Status: None   Collection Time    08/07/12  9:55 AM      Result Value Range Status   Specimen Description TRACHEAL ASPIRATE   Final   Special Requests Immunocompromised   Final   Gram Stain     Final   Value: MODERATE WBC PRESENT,BOTH PMN AND MONONUCLEAR     RARE SQUAMOUS EPITHELIAL CELLS PRESENT     NO ORGANISMS SEEN   Culture Non-Pathogenic Oropharyngeal-type [place] Isolated.   Final   Report Status 08/09/2012 FINAL   Final  MRSA PCR SCREENING     Status: None   Collection Time    08/07/12 10:32 AM      Result Value Range Status   MRSA by PCR NEGATIVE  NEGATIVE Final   Comment:            The GeneXpert MRSA Assay (FDA     approved for NASAL specimens     only), is one component of a     comprehensive MRSA colonization     surveillance program. It is not     intended to diagnose MRSA     infection nor to guide or     monitor treatment for     MRSA infections.  URINE CULTURE  Status: None   Collection Time    08/07/12 12:07 PM      Result Value Range Status   Specimen Description URINE, CATHETERIZED   Final   Special Requests Normal   Final   Culture  Setup Time 08/07/2012 13:15   Final   Colony Count NO GROWTH   Final   Culture NO GROWTH   Final   Report Status 08/08/2012 FINAL   Final   Assessment: 79 yr old male on multiple antibiotics for hx. of colitis and possible pneumonia.  His culture data is all negative thus far. He is still spiking temp.  Vancomycin trough = 9.5 mcg / dL (sub-therapeutic)   Goal of Therapy: Vancomycin trough level 15-20 mcg/ml  Plan: 1) Increase vancomycin to 1000 mg iv  Q 12 hours 2) Continue to follow  Thank you. Okey Regal, PharmD [phone]   08/09/2012 10:22 PM

## 2012-08-09 NOTE — Evaluation (Signed)
Occupational Therapy Evaluation Patient Details Name: Raymond Rangel MRN: [id number] DOB: July 24, 1931 Today's Date: 08/09/2012 Time: 4098-1191 OT Time Calculation (min): 30 min  OT Assessment / Plan / Recommendation Clinical Impression  Pt admitted from SNF with weakness, dehydration, sepsis and respiratory failure in setting of presumed infectious colitis and PNA.  Pt recently admitted to SNF s/p CVA affecting R side.   Will benefit from continued OT services to address below problem list. Recommending return to SNF.    OT Assessment  Patient needs continued OT Services    Follow Up Recommendations  SNF    Barriers to Discharge      Equipment Recommendations  None recommended by OT    Recommendations for Other Services    Frequency  Min 2X/week    Precautions / Restrictions Precautions Precautions: Fall Restrictions Weight Bearing Restrictions: No   Pertinent Vitals/Pain See vitals    ADL  Upper Body Bathing: Simulated;Moderate assistance Where Assessed - Upper Body Bathing: Unsupported sitting Lower Body Bathing: Simulated;Moderate assistance Where Assessed - Lower Body Bathing: Unsupported sitting Upper Body Dressing: Simulated;Moderate assistance Where Assessed - Upper Body Dressing: Unsupported sitting Lower Body Dressing: Simulated;Moderate assistance Where Assessed - Lower Body Dressing: Unsupported sitting Toilet Transfer: Simulated;+2 Total assistance Toilet Transfer: Patient Percentage: 40% Toilet Transfer Method: Stand pivot Acupuncturist:  (bed to chair) Toileting - Clothing Manipulation and Hygiene: Performed;+2 Total assistance Toileting - Clothing Manipulation and Hygiene: Patient Percentage: 50% Where Assessed - Glass blower/designer Manipulation and Hygiene: Standing Equipment Used: Gait belt Transfers/Ambulation Related to ADLs: +2 assist for steadying due to  R LE coordination deficits during SPT from bed to chair. ADL Comments: Pt stood with +2  assist from PT/OT while PT assisted with back peri care from [facility] in bed.  Pt limited by ataxic movements in R UE/LE.     OT Diagnosis: Generalized weakness;Cognitive deficits;Disturbance of vision;Paresis;Ataxia  OT Problem List: Decreased strength;Impaired balance (sitting and/or standing);Impaired vision/perception;Decreased coordination;Decreased cognition;Decreased safety awareness;Decreased knowledge of use of DME or AE;Decreased knowledge of precautions;Impaired UE functional use;Impaired sensation OT Treatment Interventions: Self-care/ADL training;Neuromuscular education;DME and/or AE instruction;Therapeutic activities;Cognitive remediation/compensation;Visual/perceptual remediation/compensation;Patient/family education;Balance training   OT Goals Acute Rehab OT Goals OT Goal Formulation: With patient Time For Goal Achievement: 08/23/12 Potential to Achieve Goals: Good ADL Goals Pt Will Perform Grooming: with min assist;Standing at sink;Supported (using RUE) ADL Goal: Grooming - Progress: Goal set today Pt Will Perform Upper Body Bathing: with min assist;Sitting, edge of bed;Sitting, chair (using RUE) ADL Goal: Upper Body Bathing - Progress: Goal set today Pt Will Perform Upper Body Dressing: with min assist;Unsupported;Sitting, bed;Sitting, chair Pt Will Transfer to Toilet: with mod assist;Stand pivot transfer;3-in-1 ADL Goal: Toilet Transfer - Progress: Goal set today Pt Will Perform Toileting - Clothing Manipulation: with min assist;Standing ADL Goal: Toileting - Clothing Manipulation - Progress: Goal set today Pt Will Perform Toileting - Hygiene: with min assist;Standing at 3-in-1/toilet ADL Goal: Toileting - Hygiene - Progress: Goal set today Miscellaneous OT Goals Miscellaneous OT Goal #1: Pt will perform bed mobility with supervision as precursor for EOB ADLs. OT Goal: Miscellaneous Goal #1 - Progress: Goal set today Miscellaneous OT Goal #2: Pt will perform static standing  balance task >5 min with min assist as precursor for ADLs. OT Goal: Miscellaneous Goal #2 - Progress: Goal set today  Visit Information  Last OT Received On: 08/09/12 Assistance Needed: +2 PT/OT Co-Evaluation/Treatment: Yes    Subjective Data      Prior Functioning     Home Living  Available Help at Discharge: Skilled Nursing Facility;Available 24 hours/day Type of Home: Skilled Nursing Facility Additional Comments: Has been at [facility] since Feb recovering from [facility] Prior Function Level of Independence: Needs assistance Needs Assistance: Bathing;Dressing;Feeding;Grooming;Toileting;Meal Prep;Light Housekeeping;Gait;Transfers Bath: Total Dressing: Total Feeding: Moderate Grooming: Moderate Toileting: Total Meal Prep: Total Light Housekeeping: Total Gait Assistance: max assist with therapist Transfer Assistance: max assist with therapist/staff at NH Able to Take Stairs?: No Driving: No Vocation: Retired Comments:  Musician: No difficulties Dominant Hand: Right         Vision/Perception Vision - Assessment Eye Alignment: Impaired (comment) Additional Comments: Unable to fully test vision due to attention deficits and difficulty following commands.  Pt requiring max visual and verbal cueing to locate wall clock on R side of room but was able to read time once locating clock.  Will continue to assess.   Cognition  Cognition Arousal/Alertness: Awake/alert Behavior During Therapy: WFL for tasks assessed/performed Overall Cognitive Status: History of cognitive impairments - at baseline Area of Impairment: Orientation;Attention;Memory;Following commands;Safety/judgement;Awareness;Problem solving Orientation Level: Disoriented to;Place;Time;Situation Current Attention Level: Focused Memory: Decreased short-term memory Following Commands: Follows one step commands inconsistently;Follows one step commands with increased time Safety/Judgement: Decreased  awareness of safety;Decreased awareness of deficits Problem Solving: Slow processing;Decreased initiation;Difficulty sequencing;Requires verbal cues;Requires tactile cues    Extremity/Trunk Assessment Right Upper Extremity Assessment RUE ROM/Strength/Tone: WFL for tasks assessed (3+/5 throughout) RUE Sensation: Deficits RUE Sensation Deficits: decreased proprioception RUE Coordination: Deficits RUE Coordination Deficits: ataxic movments. Left Upper Extremity Assessment LUE ROM/Strength/Tone: WFL for tasks assessed (3+/5 - 4/5 throughout) LUE Sensation: WFL - Light Touch;WFL - Proprioception LUE Coordination: WFL - gross/fine motor Right Lower Extremity Assessment RLE ROM/Strength/Tone: Deficits RLE ROM/Strength/Tone Deficits: hip 2+/5, knee 4/5, ankle 2-/5.  Pt weak in hip movements grossly.  Knee has strength but pt cannot use functionally at present as extensor tone takes over and pt keeps knee extended and cannot follow commands to relax LE.  Ankle - took incr time and cues to cue pt to move ankle and pt moves minimally.   RLE Sensation: Deficits RLE Sensation Deficits: decr to LT and poor proprioception RLE Coordination: Deficits RLE Coordination Deficits: Decr fine and gross motor movements Left Lower Extremity Assessment LLE ROM/Strength/Tone: WFL for tasks assessed     Mobility Bed Mobility Bed Mobility: Rolling Left;Left Sidelying to Sit;Sitting - Scoot to Edge of Bed Rolling Left: 3: Mod assist Left Sidelying to Sit: 3: Mod assist Sitting - Scoot to Edge of Bed: 3: Mod assist Sit to Sidelying Right: Not Tested (comment) Details for Bed Mobility Assistance: Cues needed for sequencing and technique.  Assist needed for moving right LE off of bed and for elevation of trunk. Transfers Transfers: Stand to Sit;Sit to Stand Sit to Stand: 1: +2 Total assist;From elevated surface;With upper extremity assist;From bed Sit to Stand: Patient Percentage: 50% Stand to Sit: 1: +2 Total  assist;With upper extremity assist;With armrests;To chair/3-in-1 Stand to Sit: Patient Percentage: 50% Details for Transfer Assistance: Pt performed sit to stand with +2 assist with pt demonstrating good initiation of movement.  Pt leaning considerable to right needing assist.  Noted pt with BM therefore cleaned pt while he was standing with OT assisting 50% for balance and PT cleaning pt.  Pt then stood pivoted to recliner from bed with bil HHA as well as assist to move LES to take 2 pivotal steps around to recliner with 3rd person bringing reclineer to pt for safety as pt with forward flexed posture as  well as poor coordination of right LE for pivot steps.  Needed assist to weight shift to left with each step.  Delayed processing of commands.     Exercise     Balance Static Sitting Balance Static Sitting - Balance Support: Bilateral upper extremity supported;Feet supported Static Sitting - Level of Assistance: 5: Stand by assistance Static Sitting - Comment/# of Minutes: 3 minutes Static Standing Balance Static Standing - Balance Support: Bilateral upper extremity supported;During functional activity Static Standing - Level of Assistance: 1: +2 Total assist;Patient percentage (comment);Other (comment) Static Standing - Comment/# of Minutes: Needs incr assist for static stance secondary to poor balance reactions and weakness and coordination problems on right LE.   End of Session OT - End of Session Equipment Utilized During Treatment: Gait belt Activity Tolerance: Patient tolerated treatment well Patient left: in chair;with call bell/phone within reach Nurse Communication: Mobility status  GO    08/09/2012 Cipriano Mile OTR/L Pager [phone] Office [phone]  Cipriano Mile 08/09/2012, 1:04 PM

## 2012-08-09 NOTE — Evaluation (Signed)
Physical Therapy Evaluation Patient Details Name: Raymond Rangel MRN: [id number] DOB: Sep 07, 1931 Today's Date: 08/09/2012 Time: 5784-6962 PT Time Calculation (min): 27 min  PT Assessment / Plan / Recommendation Clinical Impression  Pt s/p VDRF with decr mobility secondary to weakness right Hemibody from previous CVA.  Will benefit from PT to address endurance, balance and safety issues.  Will need continued therapy at [facility]      PT Assessment  Patient needs continued PT services    Follow Up Recommendations  SNF;Supervision/Assistance - 24 hour        Barriers to Discharge Decreased caregiver support      Equipment Recommendations  None recommended by PT       Frequency Min 2X/week    Precautions / Restrictions Precautions Precautions: Fall Restrictions Weight Bearing Restrictions: No   Pertinent Vitals/Pain VSS, No pain      Mobility  Bed Mobility Bed Mobility: Rolling Left;Left Sidelying to Sit;Sitting - Scoot to Edge of Bed Rolling Left: 3: Mod assist Left Sidelying to Sit: 3: Mod assist Sitting - Scoot to Edge of Bed: 3: Mod assist Sit to Sidelying Right: Not Tested (comment) Details for Bed Mobility Assistance: Cues needed for sequencing and technique.  Assist needed for moving right LE off of bed and for elevation of trunk. Transfers Transfers: Sit to Stand;Stand to Sit;Stand Pivot Transfers Sit to Stand: 1: +2 Total assist;From elevated surface;With upper extremity assist;From bed Sit to Stand: Patient Percentage: 50% Stand to Sit: 1: +2 Total assist;With upper extremity assist;With armrests;To chair/3-in-1 Stand to Sit: Patient Percentage: 50% Stand Pivot Transfers: 1: +2 Total assist Stand Pivot Transfers: Patient Percentage: 40% Details for Transfer Assistance: Pt performed sit to stand with +2 assist with pt demonstrating good initiation of movement.  Pt leaning considerable to right needing assist.  Noted pt with BM therefore cleaned pt while he was standing  with OT assisting 50% for balance and PT cleaning pt.  Pt then stood pivoted to recliner from bed with bil HHA as well as assist to move LES to take 2 pivotal steps around to recliner with 3rd person bringing reclineer to pt for safety as pt with forward flexed posture as well as poor coordination of right LE for pivot steps.  Needed assist to weight shift to left with each step.  Delayed processing of commands. Ambulation/Gait Ambulation/Gait Assistance: Not tested (comment) Stairs: No Wheelchair Mobility Wheelchair Mobility: No         PT Diagnosis: Generalized weakness  PT Problem List: Decreased activity tolerance;Decreased strength;Decreased balance;Decreased mobility;Decreased coordination;Decreased cognition;Decreased knowledge of use of DME;Decreased safety awareness;Decreased knowledge of precautions PT Treatment Interventions: DME instruction;Gait training;Functional mobility training;Therapeutic activities;Therapeutic exercise;Balance training;Patient/family education;Wheelchair mobility training   PT Goals Acute Rehab PT Goals PT Goal Formulation: With patient Time For Goal Achievement: 08/23/12 Potential to Achieve Goals: Good Pt will go Supine/Side to Sit: with min assist;with rail PT Goal: Supine/Side to Sit - Progress: Goal set today Pt will Sit at Edge of Bed: 6-10 min;with bilateral upper extremity support;with modified independence PT Goal: Sit at Edge Of Bed - Progress: Goal set today Pt will Transfer Bed to Chair/Chair to Bed: with min assist PT Transfer Goal: Bed to Chair/Chair to Bed - Progress: Goal set today Pt will Ambulate: 16 - 50 feet;with least restrictive assistive device;with +2 total assist;Other (comment) (pt =65%) PT Goal: Ambulate - Progress: Goal set today Pt will Perform Home Exercise Program: with min assist PT Goal: Perform Home Exercise Program - Progress: Goal set today  Pt will Propel Wheelchair: 51 - 150 feet;with supervision PT Goal: Propel  Wheelchair - Progress: Goal set today  Visit Information  Last PT Received On: 08/09/12 Assistance Needed: +2 PT/OT Co-Evaluation/Treatment: Yes    Subjective Data  Subjective: "I feel weak." Patient Stated Goal: Pt confused - unable to state   Prior Functioning  Home Living Available Help at Discharge: Skilled Nursing Facility;Available 24 hours/day Type of Home: Skilled Nursing Facility Additional Comments: Has been at [facility] since Feb recovering from [facility] Prior Function Level of Independence: Needs assistance Needs Assistance: Bathing;Dressing;Feeding;Grooming;Toileting;Meal Prep;Light Housekeeping;Gait;Transfers Bath: Total Dressing: Total Feeding: Moderate Grooming: Moderate Toileting: Total Meal Prep: Total Light Housekeeping: Total Gait Assistance: max assist with therapist Transfer Assistance: max assist with therapist/staff at NH Able to Take Stairs?: No Driving: No Vocation: Retired Musician: No difficulties    Copywriter, advertising Arousal/Alertness: Awake/alert Behavior During Therapy: WFL for tasks assessed/performed Overall Cognitive Status: History of cognitive impairments - at baseline Area of Impairment: Orientation;Attention;Memory;Following commands;Safety/judgement;Awareness;Problem solving Orientation Level: Disoriented to;Place;Time;Situation Current Attention Level: Focused (seconds only) Memory: Decreased recall of precautions Following Commands: Follows one step commands inconsistently;Follows one step commands with increased time Safety/Judgement: Decreased awareness of safety;Decreased awareness of deficits Problem Solving: Slow processing;Decreased initiation;Difficulty sequencing;Requires verbal cues;Requires tactile cues    Extremity/Trunk Assessment Right Lower Extremity Assessment RLE ROM/Strength/Tone: Deficits RLE ROM/Strength/Tone Deficits: hip 2+/5, knee 4/5, ankle 2-/5.  Pt weak in hip movements grossly.  Knee has  strength but pt cannot use functionally at present as extensor tone takes over and pt keeps knee extended and cannot follow commands to relax LE.  Ankle - took incr time and cues to cue pt to move ankle and pt moves minimally.   RLE Sensation: Deficits RLE Sensation Deficits: decr to LT and poor proprioception RLE Coordination: Deficits RLE Coordination Deficits: Decr fine and gross motor movements Left Lower Extremity Assessment LLE ROM/Strength/Tone: WFL for tasks assessed   Balance Static Sitting Balance Static Sitting - Balance Support: Bilateral upper extremity supported;Feet supported Static Sitting - Level of Assistance: 5: Stand by assistance Static Sitting - Comment/# of Minutes: 3 Static Standing Balance Static Standing - Balance Support: Bilateral upper extremity supported;During functional activity Static Standing - Level of Assistance: 1: +2 Total assist;Patient percentage (comment);Other (comment) (pt =50%) Static Standing - Comment/# of Minutes: Needs incr assist for static stance secondary to poor balance reactions and weakness and coordination problems on right LE.  End of Session PT - End of Session Equipment Utilized During Treatment: Gait belt;Oxygen Activity Tolerance: Patient limited by fatigue Patient left: in chair;with call bell/phone within reach Nurse Communication: Mobility status;Need for lift equipment       INGOLD,Kadance Mccuistion 08/09/2012, 10:20 AM  [facility] Acute Rehabilitation [phone] (pager)

## 2012-08-09 NOTE — Progress Notes (Signed)
Speech Language Pathology Dysphagia Treatment Patient Details Name: Raymond Rangel MRN: [id number] DOB: 08-06-1931 Today's Date: 08/09/2012 Time: 1478-2956 SLP Time Calculation (min): 11 min  Assessment / Plan / Recommendation Clinical Impression  Pt presents with significant improvement in function this am. Fully alert, vocal quality clear though a little hoarse. Trials of puree resulted in multiple swallows (suspect piecemeal transit) but clear vocal quality. There was a delayed throat clear with thin liquids, but still much improved. Pt able to participate in MBS today to objectively determine ability to start diet given history of silent aspiration and current loss of functional reserve in setting of RLL pna. Pt ok to take meds in puree, otherwise keep NPO until MBS today. RN aware.     Diet Recommendation  Continue with Current Diet: NPO    SLP Plan MBS   Pertinent Vitals/Pain NA   Swallowing Goals  SLP Swallowing Goals Goal #3: Patient will demonstrate clear vocal quality at baseline indicative of improved secretion management to determine readiness for proceeding with objective evaluation.  Swallow Study Goal #3 - Progress: Met  General Temperature Spikes Noted: Yes Respiratory Status: Supplemental O2 delivered via (comment) Behavior/Cognition: Alert;Cooperative Oral Cavity - Dentition: Missing dentition;Poor condition Patient Positioning: Upright in bed  Oral Cavity - Oral Hygiene Does patient have any of the following "at risk" factors?: Oxygen therapy - cannula, mask, simple oxygen devices Patient is AT RISK - Oral Care Protocol followed (see row info): Yes   Dysphagia Treatment Treatment focused on: Upgraded PO texture trials Treatment Methods/Modalities: Skilled observation Patient observed directly with PO's: Yes Type of PO's observed: Dysphagia 1 (puree);Thin liquids Feeding: Able to feed self Liquids provided via: Cup Pharyngeal Phase Signs & Symptoms: Multiple  swallows Type of cueing: Verbal Amount of cueing: Minimal   GO    Harlon Ditty, MA CCC-SLP [phone]  Claudine Mouton 08/09/2012, 8:15 AM

## 2012-08-10 ENCOUNTER — Inpatient Hospital Stay (HOSPITAL_COMMUNITY): Payer: Medicare Other

## 2012-08-10 LAB — BASIC METABOLIC PANEL
BUN: 8 mg/dL (ref 6–23)
CO2: 23 mEq/L (ref 19–32)
GFR calc non Af Amer: 82 mL/min — ABNORMAL LOW (ref >90)
Glucose, Bld: 109 mg/dL — ABNORMAL HIGH (ref 70–99)
Potassium: 3 mEq/L — ABNORMAL LOW (ref 3.5–5.1)

## 2012-08-10 LAB — CBC
HCT: 32 % — ABNORMAL LOW (ref 39.0–52.0)
Hemoglobin: 11 g/dL — ABNORMAL LOW (ref 13.0–17.0)
MCH: 28.6 pg (ref 26.0–34.0)
MCHC: 34.4 g/dL (ref 30.0–36.0)
MCV: 83.3 fL (ref 78.0–100.0)

## 2012-08-10 MED ORDER — POTASSIUM CHLORIDE CRYS ER 20 MEQ PO TBCR
40.0000 meq | EXTENDED_RELEASE_TABLET | ORAL | Status: AC
  Administered 2012-08-10 (×2): 40 meq via ORAL
  Filled 2012-08-10 (×2): qty 2

## 2012-08-10 MED ORDER — FUROSEMIDE 10 MG/ML IJ SOLN
10.0000 mg | Freq: Once | INTRAMUSCULAR | Status: AC
  Administered 2012-08-10: 10 mg via INTRAVENOUS
  Filled 2012-08-10 (×2): qty 1

## 2012-08-10 NOTE — Clinical Social Work Note (Signed)
Clinical Social Worker received a new referral for extending SNF search at request of son. CSW spoke with son and he was agreeable for CSW to extend SNF search in [place]. Son appeared to have some concerns regarding the level of care received at previous facility. CSW will follow up with son when bed offers are made.   Rozetta Nunnery MSW, [employer] [phone]

## 2012-08-10 NOTE — Progress Notes (Signed)
PULMONARY  / CRITICAL CARE MEDICINE  Name: Raymond Rangel MRN: [id number] DOB: 03-29-1932    ADMISSION DATE:  08/07/2012   PRIMARY SERVICE: PCCM  CHIEF COMPLAINT:  Shock./resp failure   BRIEF PATIENT DESCRIPTION:  This is a 79 YOM who resides at SNF. Admitted on 4/29 w/ weakness, dehydration, sepsis and respiratory failure in setting of presumed infectious colitis and PNA.    SIGNIFICANT EVENTS / STUDIES:  4/29- ETT, distress 5/1 - Passed SLP eval. Reg diet with thin liquids   LINES / TUBES ETT 4/29>>>4/30 Right [place] CVL 4/29 >> 5/1  CULTURES: UC 4/29>>> NEG Sputum 4/29>>> NOF U strep 4/29>>>neg U legionella 4/29>>> NEG BCX2 4/29 >>   ANTIBIOTICS: Zosyn 4/29>>>4/29 PO Vanc 4/29>>>4/30 Flagyl 4/29>>>4/30 IV vanc 4/29>>>5/2 IV ceftriaxone 4/30>>  Imaging: 5/2 CXR>>>>bibasilar infiltrates increased   SUBJECTIVE: Pt states he is doing well and having no difficulty breathing. He denies chest pain, chills, or any other complaints. Patient' s son is concerned on where his father will go once being discharged. He does not feel like it is safe for him to return home and needs care at a SNF.   VITAL SIGNS: Temp:  [98.1 F (36.7 C)-99 F (37.2 C)] 99 F (37.2 C) (05/02 0644) Pulse Rate:  [34-105] 88 (05/02 0644) Resp:  [20-25] 20 (05/02 0644) BP: (92-141)/(53-76) 110/58 mmHg (05/02 0644) SpO2:  [93 %-100 %] 99 % (05/02 0644) HEMODYNAMICS:   VENTILATOR SETTINGS:   INTAKE / OUTPUT: Intake/Output     05/01 0701 - 05/02 0700 05/02 0701 - 05/03 0700   I.V. (mL/kg) 320 (5.2)    NG/GT     IV Piggyback 150    Total Intake(mL/kg) 470 (7.6)    Urine (mL/kg/hr) 836 (0.6)    Stool 1 (0)    Total Output 837     Net -367          Stool Occurrence 1 x      PHYSICAL EXAMINATION: General: NAD Neuro: nonfocal HEENT:  Poor dention. Otherwise WNL Cardiovascular:  RRR, no murmurs Lungs:  No respiratory distress. Crackles in Right base Abdomen:  Soft, + bowel sounds, no  tenderness to palpation Ext: no edema, warm, no obvious deformities   LABS:  Recent Labs Lab 08/08/12 1945 08/09/12 0252 08/10/12 0530  NA 142 142 139  K 3.5 3.7 3.0*  CL 109 110 106  CO2 23 22 23   BUN 10 10 8   CREATININE 0.90 0.89 0.81  GLUCOSE 92 83 109*    Recent Labs Lab 08/07/12 0725 08/07/12 0735 08/08/12 0500 08/10/12 0530  HGB 15.3 16.0 12.5* 11.0*  HCT 43.7 47.0 36.6* 32.0*  WBC 13.3*  --  9.9 9.3  PLT 244  --  173 178    Recent Labs Lab 08/07/12 0725 08/07/12 0748 08/07/12 1200 08/08/12 0500 08/10/12 0530  PROCALCITON 9.01  --   --   --   --   WBC 13.3*  --   --  9.9 9.3  LATICACIDVEN  --  8.2* 4.7*  --   --      Recent Labs Lab 08/08/12 08/08/12 0406 08/08/12 0716 08/08/12 1104 08/08/12 1556  GLUCAP 129* 107* 124* 98 86     ASSESSMENT / PLAN:  PULMONARY A:acute respiratory failure, resolved.  Hospital Acquired Pneumonia- sputum culture negative  P:   -monitor pulse ox -on vanc and rocephin establish a stop date, dc vanc -repeat cxr in am for increased infiltrate? -clinically improved  CARDIOVASCULAR A: SIRS/sepsis - likely PNA.  Hyperlipidemia P:  -Monitor BP -BC still pending -home Lipitor  RENAL A:  Mixed gap/NAG acidosis 4/29, resolved.  P:   -Monitor BMET -may need lasix, consider , but assess onwon again as was neg on own last 24 hrs  GASTROINTESTINAL A:  Diarrhea - none since admission P:   -Regular diet -ppi, if not home med, dc  HEMATOLOGIC A: h/o DVt , CBC 12. No active bleeding Anemia P:  -Cont [place] heparin -cbc in am , likely some dilutional drops  INFECTIOUS A:  Suspected HCAP, did he have norovisurs? Then aspirated? likely P:   -Cont ceftriaxone to stop date -dc vanc  ENDOCRINE A:  Documented h/o DM - minimal hyperglycemia. Wasn't on any agents PTA Hypokalemia P:   -monitor BG -potassium ordered  NEUROLOGIC A:  H/o CVA      H/o vascular dementia  P:   -restart home med donepezil   -OT/PT -social work address placement   TODAY'S SUMMARY: Patient given potassium for his hypokalemia. Will repeat pts cxr in am. Will establish stop date for vanc and rocephin soon. Discussed pt with his son and his son's concerns for his father after discharge.   Gery Pray, PA-S I have fully examined this patient and agree with above findings.    And edited i nfull  Mcarthur Rossetti. Tyson Alias, MD, FACP Pgr: [phone] [facility]

## 2012-08-10 NOTE — Progress Notes (Signed)
Speech Language Pathology Dysphagia Treatment Patient Details Name: Raymond Rangel MRN: [id number] DOB: March 21, 1932 Today's Date: 08/10/2012 Time: 1045-1100 SLP Time Calculation (min): 15 min  Assessment / Plan / Recommendation Clinical Impression  F/u after yesterday's MBS to assess toleration.  Pt's son and a sitter present.  Report excellent intake at breakfast with no observed difficulty swallowing.  Per observation, presents with mild clinical symptoms consistent with yesterday's function.  Alert and communicative.  Appears to be protecting airway adequately with POs.  No further SLP f/u warranted.       Diet Recommendation  Continue with Current Diet: Regular;Thin liquid    SLP Plan All goals met;Discharge SLP treatment    Pertinent Vitals/Pain No pain   Swallowing Goals  SLP Swallowing Goals Patient will utilize recommended strategies during swallow to increase swallowing safety with: Minimal cueing Swallow Study Goal #2 - Progress: Progressing toward goal  General Temperature Spikes Noted: Yes Respiratory Status: Supplemental O2 delivered via (comment) Behavior/Cognition: Alert;Cooperative Oral Cavity - Dentition: Missing dentition;Poor condition Patient Positioning: Upright in bed  Oral Cavity - Oral Hygiene Does patient have any of the following "at risk" factors?: Oxygen therapy - cannula, mask, simple oxygen devices Patient is AT RISK - Oral Care Protocol followed (see row info): Yes   Dysphagia Treatment Treatment focused on: Skilled observation of diet tolerance Treatment Methods/Modalities: Skilled observation Patient observed directly with PO's: Yes Type of PO's observed: Thin liquids Feeding: Needs assist Liquids provided via: Cup;Straw Pharyngeal Phase Signs & Symptoms: Multiple swallows;Delayed throat clear Type of cueing: Verbal Amount of cueing: Minimal   GO    Raymond Rangel, Raymond Rangel Pager [phone]  [name] 08/10/2012, 11:21  AM

## 2012-08-10 NOTE — Clinical Documentation Improvement (Signed)
MALNUTRITION DOCUMENTATION CLARIFICATION  THIS DOCUMENT IS NOT A PERMANENT PART OF THE MEDICAL RECORD  Please update your documentation within the medical record to reflect your response to this query.                                                                                         08/10/12   Dear Dr. Tyson Alias,  In a better effort to capture your patient's severity of illness, reflect appropriate length of stay and utilization of resources, a review of the patient medical record has revealed the following indicators.    Based on your clinical judgment, please clarify and document in a progress note and/or discharge summary the clinical condition associated with the following supporting information: Per Nutrition Assessment on 08/09/12--"Severe Malnutrition in the context of chronic illness." (per Joaquin Courts, RD)--assessment done due to low Braden score.    In responding to this query please exercise your independent judgment.  The fact that a query is asked, does not imply that any particular answer is desired or expected.  Possible Clinical Conditions?   Mild Malnutrition   Moderate Malnutrition  Severe Malnutrition    Protein Calorie Malnutrition  Severe Protein Calorie Malnutrition  Cachexia    Other Condition________________    Supporting Information: Per RD, "Pt meets criteria for severe MALNUTRITION in the context of chronic illness as evidenced by severe subcutaneous fat loss and severe muscle loss."  Signs & Symptoms: Ht  6'    Wt 135 lbs, 9.3 oz  BMI:  18.4  You may use possible, probable, or suspect with inpatient documentation.  Possible, probable, suspected diagnoses MUST be documented at the time of discharge.  Reviewed: additional documentation in the medical record  Thank You,  Darla Lesches, RN, BSN, CCRN Clinical Documentation Specialist Pager [phone]  Office [phone]  HIM department [facility]

## 2012-08-11 DIAGNOSIS — J189 Pneumonia, unspecified organism: Secondary | ICD-10-CM

## 2012-08-11 DIAGNOSIS — J9600 Acute respiratory failure, unspecified whether with hypoxia or hypercapnia: Secondary | ICD-10-CM

## 2012-08-11 DIAGNOSIS — R6510 Systemic inflammatory response syndrome (SIRS) of non-infectious origin without acute organ dysfunction: Secondary | ICD-10-CM

## 2012-08-11 LAB — BASIC METABOLIC PANEL
BUN: 7 mg/dL (ref 6–23)
CO2: 21 mEq/L (ref 19–32)
Calcium: 8 mg/dL — ABNORMAL LOW (ref 8.4–10.5)
Creatinine, Ser: 0.79 mg/dL (ref 0.50–1.35)
GFR calc non Af Amer: 83 mL/min — ABNORMAL LOW (ref >90)
Glucose, Bld: 100 mg/dL — ABNORMAL HIGH (ref 70–99)
Sodium: 136 mEq/L (ref 135–145)

## 2012-08-11 LAB — MAGNESIUM: Magnesium: 1.9 mg/dL (ref 1.5–2.5)

## 2012-08-11 LAB — PROTIME-INR: Prothrombin Time: 14.7 seconds (ref 11.6–15.2)

## 2012-08-11 LAB — APTT: aPTT: 52 seconds — ABNORMAL HIGH (ref 24–37)

## 2012-08-11 MED ORDER — DEXTROSE 5 % IV SOLN
10.0000 mmol | Freq: Once | INTRAVENOUS | Status: AC
  Administered 2012-08-11: 10 mmol via INTRAVENOUS
  Filled 2012-08-11: qty 3.33

## 2012-08-11 NOTE — Progress Notes (Signed)
PULMONARY  / CRITICAL CARE MEDICINE  Name: Raymond Rangel MRN: [id number] DOB: 1931/05/23    ADMISSION DATE:  08/07/2012   PRIMARY SERVICE: PCCM  CHIEF COMPLAINT:  Shock./resp failure   BRIEF PATIENT DESCRIPTION:  This is a 30 YOM who resides at SNF. Admitted on 4/29 w/ weakness, dehydration, sepsis and respiratory failure in setting of presumed infectious colitis and PNA.    SIGNIFICANT EVENTS / STUDIES:  4/29- ETT, distress 5/1 - Passed SLP eval. Reg diet with thin liquids   LINES / TUBES ETT 4/29>>>4/30 Right [place] CVL 4/29 >> 5/1  CULTURES: UC 4/29>>> NEG Sputum 4/29>>> NOF U strep 4/29>>>neg U legionella 4/29>>> NEG BCX2 4/29 >>   ANTIBIOTICS: Zosyn 4/29>>>4/29 PO Vanc 4/29>>>4/30 Flagyl 4/29>>>4/30 IV vanc 4/29>>>5/2 IV ceftriaxone 4/30>>  Imaging: 5/2 CXR>>>>bibasilar infiltrates R>L   SUBJECTIVE: patient laying in bed, no complaints of SOB or CP, no abdominal pain or diarrhea. No concerns today  VITAL SIGNS: Temp:  [98.3 F (36.8 C)-99.1 F (37.3 C)] 98.3 F (36.8 C) (05/03 1321) Pulse Rate:  [83-98] 83 (05/03 1321) Resp:  [18-20] 18 (05/03 1321) BP: (113-122)/(55-65) 114/55 mmHg (05/03 1321) SpO2:  [95 %-100 %] 100 % (05/03 1321) HEMODYNAMICS:   VENTILATOR SETTINGS:   INTAKE / OUTPUT: Intake/Output     05/03 0701 - 05/04 0700   P.O. 360   I.V. (mL/kg)    Total Intake(mL/kg) 360 (5.9)   Urine (mL/kg/hr) 300 (0.4)   Total Output 300   Net +60       Stool Occurrence 2 x     PHYSICAL EXAMINATION: General: NAD Neuro: nonfocal HEENT:  Poor dention. Otherwise WNL Cardiovascular:  RRR, no murmurs Lungs:  No respiratory distress. Crackles in Right base Abdomen:  Soft, + bowel sounds, no tenderness to palpation Ext: no edema, warm, no obvious deformities   LABS:  Recent Labs Lab 08/09/12 0252 08/10/12 0530 08/11/12 0525  NA 142 139 136  K 3.7 3.0* 3.4*  CL 110 106 104  CO2 22 23 21   BUN 10 8 7   CREATININE 0.89 0.81 0.79  GLUCOSE 83  109* 100*    Recent Labs Lab 08/07/12 0725 08/07/12 0735 08/08/12 0500 08/10/12 0530  HGB 15.3 16.0 12.5* 11.0*  HCT 43.7 47.0 36.6* 32.0*  WBC 13.3*  --  9.9 9.3  PLT 244  --  173 178    Recent Labs Lab 08/07/12 0725 08/07/12 0748 08/07/12 1200 08/08/12 0500 08/10/12 0530  PROCALCITON 9.01  --   --   --   --   WBC 13.3*  --   --  9.9 9.3  LATICACIDVEN  --  8.2* 4.7*  --   --      Recent Labs Lab 08/08/12 08/08/12 0406 08/08/12 0716 08/08/12 1104 08/08/12 1556  GLUCAP 129* 107* 124* 98 86     ASSESSMENT / PLAN:  PULMONARY A:acute respiratory failure, resolved.  Hospital Acquired Pneumonia- sputum culture negative  P:   -respiratory status remains stable, continue to monitor -on rocephin day #4, vancomycin stopped today -cxr from 08/11/11 noted, will consider repeating if clinical decompensation occurs.   CARDIOVASCULAR A: SIRS/sepsis - likely PNA.  Hyperlipidemia P:  -Monitor BP -BC still pending -home Lipitor  RENAL A: Hypokalemia and hypophosphatemia, Mixed gap/NAG acidosis 4/29, resolved.  P:   -electrolytes replaced -Monitor BMET -may need lasix, consider , but assess onwon again as was neg on own last 24 hrs  GASTROINTESTINAL A:  Diarrhea - none since admission P:   -Regular diet -  ppi, if not home med, dc  HEMATOLOGIC A: h/o DVt , CBC 12. No active bleeding Anemia P:  -Cont [place] heparin -cbc in am , likely some dilutional drops  INFECTIOUS A:  Suspected HCAP, did he have norovisurs? Then aspirated? likely P:   -Cont ceftriaxone (currently day 4) -dc vanc  ENDOCRINE A:  Documented h/o DM - minimal hyperglycemia. Wasn't on any agents PTA Hypokalemia P:   -monitor BG -potassium ordered  NEUROLOGIC A:  H/o CVA      H/o vascular dementia  P:   -on home donepezil  -OT/PT -social work address placement>> awaiting patient and family decision on SNF   TODAY'S SUMMARY: Patient given potassium for his hypokalemia. Vancomycin  stopped. Rocephin day #4  Maisie Fus, MD

## 2012-08-11 NOTE — Progress Notes (Signed)
Clinical Social Work  CSW met with patient at room and provided bed offers. Patient reports he will discuss options with son and agreeable for CSW to contact son. CSW spoke with son via phone who reports he and patient will make a decision for SNF.   Unk Lightning, LCSW (Weekend Coverage)

## 2012-08-12 LAB — BASIC METABOLIC PANEL
BUN: 7 mg/dL (ref 6–23)
Calcium: 8.1 mg/dL — ABNORMAL LOW (ref 8.4–10.5)
Creatinine, Ser: 0.74 mg/dL (ref 0.50–1.35)
GFR calc Af Amer: >90 mL/min (ref >90)
GFR calc non Af Amer: 85 mL/min — ABNORMAL LOW (ref >90)
Potassium: 3.4 mEq/L — ABNORMAL LOW (ref 3.5–5.1)

## 2012-08-12 LAB — NOROVIRUS GROUP 1 & 2 BY PCR, STOOL

## 2012-08-12 MED ORDER — ONDANSETRON HCL 4 MG/2ML IJ SOLN: 4.0000 mg | Freq: Four times a day (QID) | INTRAMUSCULAR | Status: DC | PRN

## 2012-08-12 NOTE — Progress Notes (Signed)
PULMONARY  / CRITICAL CARE MEDICINE  Name: Raymond Rangel MRN: [id number] DOB: 1931-08-15    ADMISSION DATE:  08/07/2012   PRIMARY SERVICE: PCCM  CHIEF COMPLAINT:  Shock./resp failure   BRIEF PATIENT DESCRIPTION: 66 YOM who resides at SNF. Admitted on 4/29 w/ weakness, dehydration, sepsis and respiratory failure in setting of presumed infectious colitis and PNA.    SIGNIFICANT EVENTS / STUDIES:  4/29- ETT, distress 5/1 - Passed SLP eval. Reg diet with thin liquids  LINES / TUBES ETT 4/29>>>4/30 Right [place] CVL 4/29 >> 5/1 Urinary catheter 5/2>>  CULTURES: UC 4/29>>> NEG Sputum 4/29>>> NOF U strep 4/29>>>neg U legionella 4/29>>> NEG BCX2 4/29 >>   ANTIBIOTICS: Zosyn 4/29>>>4/29 PO Vanc 4/29>>>4/30 Flagyl 4/29>>>4/30 IV vanc 4/29>>>5/2 IV ceftriaxone 4/30>>5/7 planned  Imaging: 5/2 CXR>>>>bibasilar infiltrates R>L   SUBJECTIVE: No complaints of SOB or CP, no abdominal pain or diarrhea. He complains of some nausea but no vomiting. His nurse reports that he is only eating about 25% of his meals.   VITAL SIGNS: Temp:  [98.3 F (36.8 C)-99.4 F (37.4 C)] 99.4 F (37.4 C) (05/04 0500) Pulse Rate:  [83-101] 99 (05/04 0500) Resp:  [17-18] 18 (05/04 0500) BP: (104-133)/(55-79) 104/57 mmHg (05/04 0500) SpO2:  [91 %-100 %] 91 % (05/04 0500) HEMODYNAMICS:   VENTILATOR SETTINGS:   INTAKE / OUTPUT: Intake/Output     05/03 0701 - 05/04 0700 05/04 0701 - 05/05 0700   P.O. 360    I.V. (mL/kg)     Total Intake(mL/kg) 360 (5.9)    Urine (mL/kg/hr) 1200 (0.8)    Total Output 1200     Net -840          Stool Occurrence 3 x      PHYSICAL EXAMINATION: General: NAD Neuro: nonfocal HEENT:  Poor dention. Otherwise WNL Cardiovascular:  RRR, no murmurs Lungs:  No respiratory distress. Crackles in Right base Abdomen:  Soft, + bowel sounds, no tenderness to palpation Ext: no edema, warm, no obvious deformities  LABS:  Recent Labs Lab 08/09/12 0252 08/10/12 0530  08/11/12 0525  NA 142 139 136  K 3.7 3.0* 3.4*  CL 110 106 104  CO2 22 23 21   BUN 10 8 7   CREATININE 0.89 0.81 0.79  GLUCOSE 83 109* 100*    Recent Labs Lab 08/07/12 0725 08/07/12 0735 08/08/12 0500 08/10/12 0530  HGB 15.3 16.0 12.5* 11.0*  HCT 43.7 47.0 36.6* 32.0*  WBC 13.3*  --  9.9 9.3  PLT 244  --  173 178    Recent Labs Lab 08/07/12 0725 08/07/12 0748 08/07/12 1200 08/08/12 0500 08/10/12 0530  PROCALCITON 9.01  --   --   --   --   WBC 13.3*  --   --  9.9 9.3  LATICACIDVEN  --  8.2* 4.7*  --   --      Recent Labs Lab 08/08/12 08/08/12 0406 08/08/12 0716 08/08/12 1104 08/08/12 1556  GLUCAP 129* 107* 124* 98 86     ASSESSMENT / PLAN:  PULMONARY A:acute respiratory failure, resolved.  Hospital Acquired Pneumonia- sputum culture negative  P:   -respiratory status remains stable, continue to monitor -on rocephin day #5, add stop date 8 total days -repeat cxr  CARDIOVASCULAR A: SIRS/sepsis - likely PNA Hyperlipidemia P:  -Monitor BP -BC still pending -home Lipitor  RENAL A: Hypokalemia and hypophosphatemia 5/3, Mixed gap/NAG acidosis 4/29, resolved.  P:   -Monitor BMET now and in am  -to goal even balance  GASTROINTESTINAL A:  Diarrhea - none since admission Nausea P:   -Regular diet -zofran prn  HEMATOLOGIC A: h/o DVT  Anemia P:  -Cont [place] heparin -limit phlebotomy  INFECTIOUS A:  Suspected HCAP vs aspiration pneumonia- secondary to possible norovirus P:   -Cont ceftriaxone (currently day 5), add stop date 5/7 -pcxr in am , eval ATX on top?  ENDOCRINE A:  Documented h/o DM Hypokalemia P:   -monitor BG  NEUROLOGIC A:  H/o CVA      H/o vascular dementia  P:   -on home donepezil  -OT/PT -social work address placement>> awaiting patient and family decision on SNF   Raymond Rangel, Student-PA  I have fully examined this patient and agree with above findings.    And edited in full  Raymond Rangel. Raymond Alias, MD,  FACP Pgr: [phone] [facility]

## 2012-08-12 NOTE — Progress Notes (Signed)
ANTIBIOTIC CONSULT NOTE - Follow Up  Pharmacy Consult for Ceftriaxone Indication: pneumonia  No Known Allergies  Patient Measurements: Height: 6' (182.9 cm) Weight: 135 lb 9.3 oz (61.5 kg) IBW/kg (Calculated) : 77.6  Vital Signs: Temp: 99.4 F (37.4 C) (05/04 0500) Temp src: Oral (05/04 0500) BP: 104/57 mmHg (05/04 0500) Pulse Rate: 99 (05/04 0500) Intake/Output from previous day: 05/03 0701 - 05/04 0700 In: 360 [P.O.:360] Out: 1200 [Urine:1200] Intake/Output from this shift:    Labs:  Recent Labs  08/10/12 0530 08/11/12 0525  WBC 9.3  --   HGB 11.0*  --   PLT 178  --   CREATININE 0.81 0.79   Estimated Creatinine Clearance: 65.1 ml/min (by C-G formula based on Cr of 0.79).  Recent Labs  08/09/12 2114  VANCOTROUGH 9.5*     Microbiology: Recent Results (from the past 720 hour(s))  CULTURE, BLOOD (ROUTINE X 2)     Status: None   Collection Time    08/07/12  7:48 AM      Result Value Range Status   Specimen Description BLOOD RIGHT ARM   Final   Special Requests BOTTLES DRAWN AEROBIC ONLY 8CC   Final   Culture  Setup Time 08/07/2012 18:28   Final   Culture     Final   Value:        BLOOD CULTURE RECEIVED NO GROWTH TO DATE CULTURE WILL BE HELD FOR 5 DAYS BEFORE ISSUING A FINAL NEGATIVE REPORT   Report Status PENDING   Incomplete  CULTURE, BLOOD (ROUTINE X 2)     Status: None   Collection Time    08/07/12  7:52 AM      Result Value Range Status   Specimen Description BLOOD RIGHT HAND   Final   Special Requests BOTTLES DRAWN AEROBIC ONLY 5CC   Final   Culture  Setup Time 08/07/2012 18:27   Final   Culture     Final   Value:        BLOOD CULTURE RECEIVED NO GROWTH TO DATE CULTURE WILL BE HELD FOR 5 DAYS BEFORE ISSUING A FINAL NEGATIVE REPORT   Report Status PENDING   Incomplete  CULTURE, RESPIRATORY (NON-EXPECTORATED)     Status: None   Collection Time    08/07/12  9:55 AM      Result Value Range Status   Specimen Description TRACHEAL ASPIRATE   Final   Special Requests Immunocompromised   Final   Gram Stain     Final   Value: MODERATE WBC PRESENT,BOTH PMN AND MONONUCLEAR     RARE SQUAMOUS EPITHELIAL CELLS PRESENT     NO ORGANISMS SEEN   Culture Non-Pathogenic Oropharyngeal-type [place] Isolated.   Final   Report Status 08/09/2012 FINAL   Final  MRSA PCR SCREENING     Status: None   Collection Time    08/07/12 10:32 AM      Result Value Range Status   MRSA by PCR NEGATIVE  NEGATIVE Final   Comment:            The GeneXpert MRSA Assay (FDA     approved for NASAL specimens     only), is one component of a     comprehensive MRSA colonization     surveillance program. It is not     intended to diagnose MRSA     infection nor to guide or     monitor treatment for     MRSA infections.  URINE CULTURE     Status:  None   Collection Time    08/07/12 12:07 PM      Result Value Range Status   Specimen Description URINE, CATHETERIZED   Final   Special Requests Normal   Final   Culture  Setup Time 08/07/2012 13:15   Final   Colony Count NO GROWTH   Final   Culture NO GROWTH   Final   Report Status 08/08/2012 FINAL   Final   Assessment: 79 yr old male on Day #6 Ceftriaxone for suspected HCAP.   Tm 99.4, WBC down to nl.   Goal of Therapy: Eradication of infection  Plan:  - Continue Ceftriaxone 2gm IV q24 hrs - Consider d/c Ceftriaxone after 7-8 days of therapy - Pharmacy will sign off - please reconsult if needed.  Christoper Fabian, PharmD, BCPS Clinical pharmacist, pager [phone] 08/12/2012 10:32 AM

## 2012-08-13 ENCOUNTER — Inpatient Hospital Stay (HOSPITAL_COMMUNITY): Payer: Medicare Other

## 2012-08-13 LAB — PHOSPHORUS: Phosphorus: 3.4 mg/dL (ref 2.3–4.6)

## 2012-08-13 LAB — CULTURE, BLOOD (ROUTINE X 2)
Culture: NO GROWTH
Culture: NO GROWTH

## 2012-08-13 LAB — BASIC METABOLIC PANEL
BUN: 8 mg/dL (ref 6–23)
Calcium: 8.3 mg/dL — ABNORMAL LOW (ref 8.4–10.5)
Chloride: 102 mEq/L (ref 96–112)
Creatinine, Ser: 0.79 mg/dL (ref 0.50–1.35)
GFR calc Af Amer: >90 mL/min (ref >90)
GFR calc non Af Amer: 83 mL/min — ABNORMAL LOW (ref >90)

## 2012-08-13 MED ORDER — MAGNESIUM SULFATE 50 % IJ SOLN: 2.0000 g | Freq: Once | INTRAVENOUS | Status: DC

## 2012-08-13 MED ORDER — AMOXICILLIN-POT CLAVULANATE 875-125 MG PO TABS
1.0000 | ORAL_TABLET | Freq: Two times a day (BID) | ORAL | Status: DC
  Administered 2012-08-13 – 2012-08-14 (×3): 1 via ORAL
  Filled 2012-08-13 (×4): qty 1

## 2012-08-13 MED ORDER — POTASSIUM CHLORIDE CRYS ER 20 MEQ PO TBCR
40.0000 meq | EXTENDED_RELEASE_TABLET | Freq: Once | ORAL | Status: AC
  Administered 2012-08-13: 40 meq via ORAL

## 2012-08-13 MED ORDER — POTASSIUM CHLORIDE CRYS ER 20 MEQ PO TBCR
40.0000 meq | EXTENDED_RELEASE_TABLET | ORAL | Status: AC
  Administered 2012-08-13 (×2): 40 meq via ORAL
  Filled 2012-08-13: qty 2

## 2012-08-13 MED ORDER — MAGNESIUM SULFATE 40 MG/ML IJ SOLN
2.0000 g | Freq: Once | INTRAMUSCULAR | Status: AC
  Administered 2012-08-13: 2 g via INTRAVENOUS
  Filled 2012-08-13: qty 50

## 2012-08-13 NOTE — Progress Notes (Signed)
Physical Therapy Treatment Patient Details Name: Raymond Rangel MRN: [id number] DOB: 1932/02/20 Today's Date: 08/13/2012 Time: 4098-1191 PT Time Calculation (min): 24 min  PT Assessment / Plan / Recommendation Comments on Treatment Session  Pt is significantly dependent with mobility requiring +2 assist to safely transfer to a recliner. Pt demonstrates cognitive and motor deficits contributing to his dependencies. Agree with d/c plan for SNF.    Follow Up Recommendations  SNF     Does the patient have the potential to tolerate intense rehabilitation     Barriers to Discharge        Equipment Recommendations  None recommended by PT    Recommendations for Other Services    Frequency     Plan Discharge plan remains appropriate    Precautions / Restrictions Precautions Precautions: Fall   Pertinent Vitals/Pain     Mobility  Bed Mobility Bed Mobility: Supine to Sit;Sitting - Scoot to Edge of Bed Supine to Sit: 3: Mod assist Sitting - Scoot to Edge of Bed: 3: Mod assist Details for Bed Mobility Assistance: tactile cues for technique, increased rigidity with movement, leans/falls to right Transfers Transfers: Sit to Stand;Stand to Sit;Stand Pivot Transfers Sit to Stand: 1: +2 Total assist;From bed;With upper extremity assist Sit to Stand: Patient Percentage: 40% Stand to Sit: 1: +2 Total assist;To bed;To chair/3-in-1 Stand to Sit: Patient Percentage: 40% Stand Pivot Transfers: 1: +2 Total assist Stand Pivot Transfers: Patient Percentage: 40% Details for Transfer Assistance: sit to stand multiple times and static standing while nurse tech assisted with cleaning from a BM. Pt leans heavily to the right and decreased motor control noted of the both LEs. Pt needed his left foot blocked and max cues to stand upright and shift weight over to left side. Pt was able to initiate correction, but not able to sustain posture after 5 seconds. +2 total assist stand pivot to recliner. Pt with  difficulty sequencing and controlling bil LEs to take a step and maintain balance. Ambulation/Gait Ambulation/Gait Assistance: Not tested (comment)    Exercises     PT Diagnosis:    PT Problem List:   PT Treatment Interventions:     PT Goals Acute Rehab PT Goals PT Goal: Supine/Side to Sit - Progress: Progressing toward goal PT Goal: Sit at Edge Of Bed - Progress: Not progressing PT Transfer Goal: Bed to Chair/Chair to Bed - Progress: Not progressing PT Goal: Ambulate - Progress: Not progressing  Visit Information  Last PT Received On: 08/13/12 Assistance Needed: +2    Subjective Data  Subjective: I am ready to get up.   Cognition  Cognition Arousal/Alertness: Awake/alert Behavior During Therapy: WFL for tasks assessed/performed Overall Cognitive Status: No family/caregiver present to determine baseline cognitive functioning Area of Impairment: Following commands;Safety/judgement;Awareness;Problem solving;Attention;Memory Current Attention Level: Focused Memory: Decreased short-term memory Following Commands: Follows one step commands inconsistently Safety/Judgement: Decreased awareness of deficits;Decreased awareness of safety Problem Solving: Slow processing;Requires verbal cues;Requires tactile cues;Difficulty sequencing;Decreased initiation    Balance  Balance Balance Assessed: Yes Static Sitting Balance Static Sitting - Balance Support: Bilateral upper extremity supported;Feet supported Static Sitting - Level of Assistance: 4: Min assist Static Sitting - Comment/# of Minutes: leans to right and posterior Static Standing Balance Static Standing - Balance Support: Bilateral upper extremity supported Static Standing - Level of Assistance: 1: +2 Total assist;Patient percentage (comment) (30-40% with fatigue)  End of Session PT - End of Session Equipment Utilized During Treatment: Gait belt Activity Tolerance: Patient limited by fatigue Patient left: in chair;with  call bell/phone within reach Nurse Communication: Mobility status;Other (comment) (+2 OOB to chair)   GP     Greggory Stallion 08/13/2012, 1:22 PM

## 2012-08-13 NOTE — Progress Notes (Signed)
PULMONARY  / CRITICAL CARE MEDICINE  Name: Raymond Rangel MRN: [id number] DOB: 17-Aug-1931    ADMISSION DATE:  08/07/2012   PRIMARY SERVICE: PCCM  CHIEF COMPLAINT:  Shock./resp failure   BRIEF PATIENT DESCRIPTION: 31 YOM who resides at SNF. Admitted on 4/29 w/ weakness, dehydration, sepsis and respiratory failure in setting of presumed infectious colitis and PNA.    SIGNIFICANT EVENTS / STUDIES:  4/29- ETT, distress 5/1 - Passed SLP eval. Reg diet with thin liquids  LINES / TUBES ETT 4/29>>>4/30 Right [place] CVL 4/29 >> 5/1 Urinary catheter 5/2>>  CULTURES: UC 4/29>>> NEG Sputum 4/29>>> NOF U strep 4/29>>>neg U legionella 4/29>>> NEG BCX2 4/29 >>   ANTIBIOTICS: Zosyn 4/29>>>4/29 PO Vanc 4/29>>>4/30 Flagyl 4/29>>>4/30 IV vanc 4/29>>>5/2 IV ceftriaxone 4/30>>5/7 planned  CXR: No new film   SUBJECTIVE: No complaints of SOB or CP, no abdominal pain or diarrhea. He complains of some nausea but no vomiting. His nurse reports that he is only eating about 25% of his meals.   VITAL SIGNS: Temp:  [98.3 F (36.8 C)-99.3 F (37.4 C)] 98.3 F (36.8 C) (05/05 1313) Pulse Rate:  [90-95] 90 (05/05 1313) Resp:  [16-18] 16 (05/05 1313) BP: (107-128)/(58-66) 128/65 mmHg (05/05 1313) SpO2:  [93 %-97 %] 95 % (05/05 1313) HEMODYNAMICS:   VENTILATOR SETTINGS:   INTAKE / OUTPUT: Intake/Output     05/05 0701 - 05/06 0700   P.O.    I.V. (mL/kg)    Total Intake(mL/kg)    Urine (mL/kg/hr) 125 (0.1)   Stool    Total Output 125   Net -125         PHYSICAL EXAMINATION: General: NAD Neuro: nonfocal HEENT:  Poor dention. Otherwise WNL Cardiovascular:  RRR, no murmurs Lungs:  No respiratory distress. Crackles in Right base Abdomen:  Soft, + bowel sounds, no tenderness to palpation Ext: no edema, warm, no obvious deformities  LABS:  Recent Labs Lab 08/11/12 0525 08/12/12 1107 08/13/12 0510  NA 136 136 138  K 3.4* 3.4* 3.2*  CL 104 103 102  CO2 21 23 24   BUN 7 7 8    CREATININE 0.79 0.74 0.79  GLUCOSE 100* 122* 118*    Recent Labs Lab 08/07/12 0725 08/07/12 0735 08/08/12 0500 08/10/12 0530  HGB 15.3 16.0 12.5* 11.0*  HCT 43.7 47.0 36.6* 32.0*  WBC 13.3*  --  9.9 9.3  PLT 244  --  173 178    Recent Labs Lab 08/07/12 0725 08/07/12 0748 08/07/12 1200 08/08/12 0500 08/10/12 0530  PROCALCITON 9.01  --   --   --   --   WBC 13.3*  --   --  9.9 9.3  LATICACIDVEN  --  8.2* 4.7*  --   --      Recent Labs Lab 08/08/12 0716 08/08/12 1104 08/08/12 1556 08/12/12 2219 08/13/12 0813  GLUCAP 124* 98 86 103* 103*     ASSESSMENT / PLAN:  PULMONARY A:acute respiratory failure, resolved.  Hospital Acquired Pneumonia- sputum culture negative  P:   -respiratory status remains stable, continue to monitor -on rocephin day #5, add stop date 8 total days -repeat cxr  CARDIOVASCULAR A: SIRS/sepsis - likely PNA Hyperlipidemia P:  -Monitor BP -BC still pending -home Lipitor  RENAL A: Hypokalemia and hypophosphatemia 5/3, Mixed gap/NAG acidosis 4/29, resolved.  P:   -Monitor BMET now and in am  -to goal even balance  GASTROINTESTINAL A:  Diarrhea - none since admission Nausea P:   -Regular diet -zofran prn  HEMATOLOGIC A: h/o  DVT  Anemia P:  -Cont [place] heparin -limit phlebotomy  INFECTIOUS A:  Suspected HCAP vs aspiration pneumonia- secondary to possible norovirus P:   -Cont ceftriaxone (currently day 5), add stop date 5/7 -pcxr in am , eval ATX on top?  ENDOCRINE A:  Documented h/o DM Hypokalemia P:   -monitor BG  NEUROLOGIC A:  H/o CVA      H/o vascular dementia  P:   -on home donepezil  -OT/PT -social work address placement>> awaiting patient and family decision on SNF   Billy Fischer, MD ; [facility] [phone].  After 5:30 PM or weekends, call [phone]

## 2012-08-13 NOTE — Clinical Social Work Note (Addendum)
Clinical Social Worker met with patient's son to discuss discharge plans. Son explained his concerns he had initially with previous facility, but is requesting for patient to be placed back into [facility] if they have availability. Son reported that he feels like he has addressed his concerns with the facility and is planning on speaking with the facility today. CSW provided the list of offers received from SNF search. Son appreciated CSW's visit and discussion. CSW will send updated clinicals to [facility].   11:59 am CSW followed up with son and he reported that [facility] was not extending bed offer and he chose [facility] SNF. CSW reached out to [place], admissions coordinator at facility and she is able to accept patient today.   13:49pm CSW received call from [place] at [facility] and all paperwork has been completed. Son requested patient be transported via ambulance. CSW will complete discharge packet and sign off, as social work intervention is no longer needed.  Rozetta Nunnery MSW, [employer] [phone]

## 2012-08-13 NOTE — Progress Notes (Deleted)
PULMONARY  / CRITICAL CARE MEDICINE  Name: Raymond Rangel MRN: [id number] DOB: [birth year]    ADMISSION DATE:  08/07/2012   PRIMARY SERVICE: PCCM  CHIEF COMPLAINT:  Shock./resp failure   BRIEF PATIENT DESCRIPTION: 16 YOM who resides at SNF. Admitted on 4/29 w/ weakness, dehydration, sepsis and respiratory failure in setting of presumed infectious colitis and PNA.   SIGNIFICANT EVENTS / STUDIES:  4/29- ETT, distress 5/1 - Passed SLP eval. Reg diet with thin liquids  LINES / TUBES ETT 4/29>>>4/30 Right [place] CVL 4/29 >> 5/1 Urinary catheter 5/2>>  CULTURES: UC 4/29>>> NEG Sputum 4/29>>> NOF U strep 4/29>>>neg U legionella 4/29>>> NEG BCX2 4/29 >> negative  ANTIBIOTICS: Zosyn 4/29>>>4/29 PO Vanc 4/29>>>4/30 Flagyl 4/29>>>4/30 IV vanc 4/29>>>5/2 IV ceftriaxone 4/30>>5/5 Oral augmentin 5/5>>planned stop 5/8  Imaging: 5/2 CXR>>>>bibasilar infiltrates R>L 5/5 CXR>>>Regressed right lung base pneumonia. No new cardiopulmonary abnormality.  SUBJECTIVE: Pt has no complaints today and says that he is feeling well  VITAL SIGNS: Temp:  [98.4 F (36.9 C)-99.3 F (37.4 C)] 99.3 F (37.4 C) (05/05 0642) Pulse Rate:  [93-96] 93 (05/05 0642) Resp:  [18] 18 (05/05 0642) BP: (107-122)/(58-66) 107/58 mmHg (05/05 0642) SpO2:  [93 %-97 %] 93 % (05/05 0642) HEMODYNAMICS:   VENTILATOR SETTINGS:   INTAKE / OUTPUT: Intake/Output     05/04 0701 - 05/05 0700 05/05 0701 - 05/06 0700   P.O. 360    I.V. (mL/kg) 200 (3.3)    Total Intake(mL/kg) 560 (9.1)    Urine (mL/kg/hr) 950 (0.6) 125 (0.4)   Stool 5 (0)    Total Output 955 125   Net -395 -125          PHYSICAL EXAMINATION: General: NAD Neuro: nonfocal HEENT:  Poor dention. Otherwise WNL Cardiovascular:  RRR, no murmurs Lungs:  No respiratory distress. Decreased in right base. No R/R/W Abdomen:  Soft, + bowel sounds, no tenderness to palpation Ext: no edema, warm, no obvious deformities  LABS:  Recent Labs Lab 08/11/12 0525  08/12/12 1107 08/13/12 0510  NA 136 136 138  K 3.4* 3.4* 3.2*  CL 104 103 102  CO2 21 23 24   BUN 7 7 8   CREATININE 0.79 0.74 0.79  GLUCOSE 100* 122* 118*    Recent Labs Lab 08/07/12 0725 08/07/12 0735 08/08/12 0500 08/10/12 0530  HGB 15.3 16.0 12.5* 11.0*  HCT 43.7 47.0 36.6* 32.0*  WBC 13.3*  --  9.9 9.3  PLT 244  --  173 178    Recent Labs Lab 08/07/12 0725 08/07/12 0748 08/07/12 1200 08/08/12 0500 08/10/12 0530  PROCALCITON 9.01  --   --   --   --   WBC 13.3*  --   --  9.9 9.3  LATICACIDVEN  --  8.2* 4.7*  --   --      Recent Labs Lab 08/08/12 0716 08/08/12 1104 08/08/12 1556 08/12/12 2219 08/13/12 0813  GLUCAP 124* 98 86 103* 103*     ASSESSMENT / PLAN:  PULMONARY A:acute respiratory failure, resolved.  Hospital Acquired Pneumonia- sputum culture negative  P:   -respiratory status remains stable, continue to monitor -now on augmentin, planned stop date 5/8  CARDIOVASCULAR A: SIRS/sepsis -improved; likely secondary to PNA Hyperlipidemia P:  -Monitor BP -home Lipitor  RENAL A: Hypokalemia  P:    -potassium chloride  GASTROINTESTINAL A:  Diarrhea - none since admission Nausea-resolving P:   -Regular diet -zofran prn  HEMATOLOGIC A: h/o DVT  Anemia P:  -Cont [place] heparin -limit phlebotomy  INFECTIOUS A:  Suspected HCAP vs aspiration pneumonia- secondary to possible norovirus Norovirus-resolved P:   -transition ceftriaxone to oral Augmentin, planned stop date 5/8 -BC negative  ENDOCRINE A:  Documented h/o DM P:   -monitor BG  NEUROLOGIC A:  H/o CVA      H/o vascular dementia  P:   -on home donepezil  -OT/PT  Today's Summary: Pt's potassium replaced. Transition to oral augmentin to complete abx therapy for pneumonia. Pts son is requesting for patient to be placed back into [facility] on d/c.   Gypsy Lore, Student-PA

## 2012-08-14 LAB — BASIC METABOLIC PANEL
BUN: 7 mg/dL (ref 6–23)
CO2: 23 mEq/L (ref 19–32)
Calcium: 8.2 mg/dL — ABNORMAL LOW (ref 8.4–10.5)
Chloride: 101 mEq/L (ref 96–112)
Creatinine, Ser: 0.77 mg/dL (ref 0.50–1.35)
Glucose, Bld: 87 mg/dL (ref 70–99)

## 2012-08-14 MED ORDER — AMOXICILLIN-POT CLAVULANATE 875-125 MG PO TABS: 1.0000 | ORAL_TABLET | Freq: Two times a day (BID) | ORAL | Status: AC

## 2012-08-14 NOTE — Discharge Summary (Signed)
Physician Discharge Summary     Patient ID: Raymond Rangel MRN: [id number] DOB/AGE: [birth year] 77 y.o.  Admit date: 08/07/2012 Discharge date: 08/14/2012  Admission Diagnoses: Acute respiratory failure Diarrhea Dehydration  Shock  Discharge Diagnoses:  Principal Problem:   Acute respiratory failure Active Problems:   Severe sepsis   Diabetes mellitus, type 2   CVA (cerebral infarction)   Vascular dementia with behavior disturbance   Metabolic acidosis with normal anion gap and bicarbonate losses, also + Anion gap    Hypokalemia   Healthcare-associated pneumonia   Significant Hospital tests/ studies/ interventions and procedures  LINES / TUBES  ETT 4/29>>>4/30  Right [place] CVL 4/29 >> 5/1  Urinary catheter 5/2>>out   CULTURES:  UC 4/29>>> NEG  Sputum 4/29>>> Normal flora  U strep 4/29>>>neg  U legionella 4/29>>> NEG  BCX2 4/29 >> neg Norovirus PCR: POSITIVE   ANTIBIOTICS:  Zosyn 4/29>>>4/29  PO Vanc 4/29>>>4/30  Flagyl 4/29>>>4/30  IV vanc 4/29>>>5/2  IV ceftriaxone 4/30>>5/5  augmentin 5/5-->5/7  HPI  This is a 55 YOM who resides at a SNF due to vascular dementia and prior CVA. Per nursing home notes a there has been a GI virus w/ diarrhea going around the SNF. Notes indicate that pt did have similar symptoms. Then there is an office phone call that indicates that on 4/23 the pt had become progressively confused. The confusion continued to worsen and was associated w/ increased work of breathing over the following days. In this time he was treated for possible UTI. His condition continued to worsen w/ marked increase in WOB which is what brought him to the ER on 4/29. On presentation he was confused, lethargic, Meets SIRS criteria w/ RLL infiltrate on CXR. He was admitted to the [facility] service for further rx.   Hospital Diagnoses & Course:  Acute Respiratory failure (resolved) HCAP vs Aspiration Pneumonia (treated) SIRS/Sepsis: due to PNA and Norovirus infection  (resolved) Hyperlipidemia  Fluid and Electrolyte imbalance (hypokalemia, hypophosphatemia), Dehydration (resolved) Mixed anion gap and non-anion gap acidosis: in setting of diarrhea and shock (resolved) Diarrhea: due to Norovirus infection (diarrhea resolved) Anemia of chronic disease H/o hyperglycemia H/o CVA and vascular dementia   Raymond Rangel was seen and evaluated by the critical care service after the HPI as mentioned above. Given his increased work of breathing, he was intubated for acute respiratory failure. We felt that this was multifactorial: aspiration pneumonia, acidosis and deconditioning. He remained hypotensive in spite of volume resuscitation efforts and therefore central access was placed and goal directed therapy protocol for septic shock was started. Given his history he was placed on contact isolation and in addition to routine culture data a Norovirus PCR was sent. This was indeed positive. He was supported with empiric antibiotics (see list above as we initially treated for aspiration PNA/HCAP as well as coverage for C-diff). As culture data returned antibiotics were discontinued. His IV antibiotics were stopped on 5/5 and he was placed on augmentin. We continued IVF resuscitation efforts, he did require vasoactive gtt infusion as well as bicarb replacement in setting of his NAG acidosis from diarrhea. His Work of breathing improved with in the first 24 hours, his shock resolved and he was able to come off from the ventilator on 4/30. Since this time his IV fluids have been adjusted, home medications re-evaluated, and discharge planning initiated.   As of 5/6 is is cleared to return to skilled nursing facility with instructions to resume all prior therapy. He has been without active symptoms  for > 72 hours and therefore no longer requires isolation. His only new instruction is to complete the Augmentin therapy thru 5/7   Discharge Exam: BP 114/7  Pulse 101  Temp(Src) 99.1 F (37.3  C) (Oral)  Resp 16  Ht 6' (1.829 m)  Wt 61.5 kg (135 lb 9.3 oz)  BMI 18.38 kg/m2  SpO2 94% Room air  PHYSICAL EXAMINATION:  General: NAD  Neuro: nonfocal  HEENT: Poor dention. Otherwise WNL  Cardiovascular: RRR, no murmurs  Lungs: No respiratory distress. Crackles in Right base   Abdomen: Soft, + bowel sounds, no tenderness to palpation  Ext: no edema, warm, no obvious deformities   Labs at discharge Lab Results  Component Value Date   CREATININE 0.77 08/14/2012   BUN 7 08/14/2012   NA 135 08/14/2012   K 3.6 08/14/2012   CL 101 08/14/2012   CO2 23 08/14/2012   Lab Results  Component Value Date   WBC 9.3 08/10/2012   HGB 11.0* 08/10/2012   HCT 32.0* 08/10/2012   MCV 83.3 08/10/2012   PLT 178 08/10/2012   Lab Results  Component Value Date   ALT 11 08/07/2012   AST 30 08/07/2012   ALKPHOS 126* 08/07/2012   BILITOT 0.6 08/07/2012   Lab Results  Component Value Date   INR 1.17 08/11/2012   INR 1.68* 08/08/2012   INR 1.14 08/07/2012    Current radiology studies Dg Chest Port 1 View  08/13/2012  *RADIOLOGY REPORT*  Clinical Data: 79 year old male with acute respiratory failure. Sepsis.  PORTABLE CHEST - 1 VIEW  Comparison: 08/10/2012 and earlier.  Findings: AP portable semi upright view at 0519 hours.  Mildly improved lung volumes.  Decreased right lung base airspace disease. Decreased retrocardiac opacity on the left.  No pneumothorax, pleural effusion or pulmonary edema.  Stable cardiac size and mediastinal contours.   Chronic degenerative changes at both shoulders.  IMPRESSION: Regressed right lung base pneumonia.  No new cardiopulmonary abnormality.   Original Report Authenticated By: Raymond Rangel, M.D.     Disposition:  06-Home-Health Care Svc      Discharge Orders   Future Appointments Provider Department Dept Phone   09/05/2012 11:00 AM Raymond Plump, MD [facility]  [place] [phone]   10/24/2012 2:00 PM Raymond Riley, MD [facility] [phone]    Future Orders Complete By Expires     Diet - low sodium heart healthy  As directed     Increase activity slowly  As directed         Medication List    STOP taking these medications       loperamide 2 MG capsule  Commonly known as:  IMODIUM     promethazine 25 MG suppository  Commonly known as:  PHENERGAN     promethazine 25 MG tablet  Commonly known as:  PHENERGAN     promethazine 25 MG/ML injection  Commonly known as:  PHENERGAN      TAKE these medications       amoxicillin-clavulanate 875-125 MG per tablet  Commonly known as:  AUGMENTIN  Take 1 tablet by mouth every 12 (twelve) hours.     aspirin 81 MG chewable tablet  Chew 1 tablet (81 mg total) by mouth daily.     atorvastatin 40 MG tablet  Commonly known as:  LIPITOR  Take 1 tablet (40 mg total) by mouth daily.     clopidogrel 75 MG tablet  Commonly known as:  PLAVIX  Take 1  tablet (75 mg total) by mouth daily with breakfast.     donepezil 5 MG tablet  Commonly known as:  ARICEPT  Take 10 mg by mouth at bedtime.     glucose blood test strip  Use as instructed     ONE TOUCH LANCETS Misc  Check once daily.     QUEtiapine 25 MG tablet  Commonly known as:  SEROQUEL  Take 1 tablet (25 mg total) by mouth 2 (two) times daily.         Discharged Condition: good  Physician Statement:   The Patient was personally examined, the discharge assessment and plan has been personally reviewed and I agree with ACNP Babcock's assessment and plan. > 30 minutes of time have been dedicated to discharge assessment, planning and discharge instructions.   Signed: BABCOCK,PETE 08/14/2012, 10:55 AM   Norovirus leading to asp PNA / chang ein MS, sepsis, now improved No o2 pcxr resolved Lytes wnl   Agree with above plan Will call son to update  Mcarthur Rossetti. Tyson Alias, MD, FACP Pgr: [phone] [facility]

## 2012-08-14 NOTE — Progress Notes (Signed)
Pt discharged to home

## 2012-08-16 ENCOUNTER — Non-Acute Institutional Stay (SKILLED_NURSING_FACILITY): Payer: Medicare Other | Admitting: Internal Medicine

## 2012-08-16 ENCOUNTER — Encounter: Payer: Self-pay | Admitting: Internal Medicine

## 2012-08-16 DIAGNOSIS — J189 Pneumonia, unspecified organism: Secondary | ICD-10-CM

## 2012-08-16 DIAGNOSIS — F0390 Unspecified dementia without behavioral disturbance: Secondary | ICD-10-CM

## 2012-08-16 DIAGNOSIS — I639 Cerebral infarction, unspecified: Secondary | ICD-10-CM

## 2012-08-16 DIAGNOSIS — R6520 Severe sepsis without septic shock: Secondary | ICD-10-CM

## 2012-08-16 DIAGNOSIS — A419 Sepsis, unspecified organism: Secondary | ICD-10-CM

## 2012-08-16 DIAGNOSIS — J9600 Acute respiratory failure, unspecified whether with hypoxia or hypercapnia: Secondary | ICD-10-CM

## 2012-08-16 DIAGNOSIS — I6350 Cerebral infarction due to unspecified occlusion or stenosis of unspecified cerebral artery: Secondary | ICD-10-CM

## 2012-08-16 NOTE — Progress Notes (Signed)
Patient ID: Raymond Rangel, male   DOB: [birth year], 79 y.o.   MRN: [id number]    PCP: [name], MD  Code Status: full code  No Known Allergies  Chief Complaint: new admit post hosptalization 08/07/12- 08/14/12  HPI:  79 year old male patient with history of vascular dementia and prior CVA went to hospital with diarrhea and increased work with breathing. He was confused, lethargic and met SIRS criteria with RLL infiltrate on CXR and norovirus infection - PCR positive. He was admitted to critical care unit. He had acute respiratory failure and required intubation. He had electrolyte imbalance and mixed anion gap and non-anion gap acidosis in setting of diarrhea and shock.He remained hypotensive in spite of volume resuscitation efforts and therefore central access was placed and sepsis protocol was followed. he was placed on contact precautions. His IV antibiotics were stopped on 5/5 and he was placed on augmentin. Given his weakness from acute illness, he was sent to SNF for rehabilitation. He was seen in his room today. He denies any complaints today  Review of Systems  Constitutional: Negative for fever, chills, malaise/fatigue and diaphoresis.  HENT: Negative for congestion and sore throat.   Respiratory: Negative for cough, sputum production, shortness of breath and wheezing.   Cardiovascular: Negative for chest pain, palpitations, claudication and PND.  Gastrointestinal: Negative for heartburn, nausea, vomiting, abdominal pain and diarrhea.  Genitourinary: Negative for dysuria.  Musculoskeletal: Negative for falls.  Skin: Negative for rash.  Neurological: Positive for weakness. Negative for dizziness, tremors and headaches.  Psychiatric/Behavioral: Positive for memory loss. Negative for depression.    Past Medical History  Diagnosis Date  . Mini stroke   . Irregular heart beat   . Blood clot in vein     left leg dvt  . Dementia   . DVT (deep venous thrombosis)   . Hypercholesteremia    . Arthritis   . Diabetes mellitus   . Infectious colitis 08/07/2012  . Dysrhythmia    Past Surgical History  Procedure Laterality Date  . Left arm    . Rotator cuff repair  2000(L) 1999 (R)   Social History:   reports that he quit smoking about 54 years ago. He has never used smokeless tobacco. He reports that he does not drink alcohol or use illicit drugs.  Family History  Problem Relation Age of Onset  . Heart disease Mother   . Diabetes Mother   . Colon cancer Neg Hx   . Prostate cancer Neg Hx     Medications: Patient's Medications  New Prescriptions   No medications on file  Previous Medications   AMOXICILLIN-CLAVULANATE (AUGMENTIN) 875-125 MG PER TABLET    Take 1 tablet by mouth 2 (two) times daily.   ASPIRIN 81 MG CHEWABLE TABLET    Chew 1 tablet (81 mg total) by mouth daily.   ATORVASTATIN (LIPITOR) 40 MG TABLET    Take 1 tablet (40 mg total) by mouth daily.   CLOPIDOGREL (PLAVIX) 75 MG TABLET    Take 1 tablet (75 mg total) by mouth daily with breakfast.   DONEPEZIL (ARICEPT) 5 MG TABLET    Take 10 mg by mouth at bedtime.   QUETIAPINE (SEROQUEL) 25 MG TABLET    Take 1 tablet (25 mg total) by mouth 2 (two) times daily.  Modified Medications   No medications on file  Discontinued Medications   GLUCOSE BLOOD TEST STRIP    Use as instructed   ONE TOUCH LANCETS MISC    Check  once daily.    Physical Exam: Filed Vitals:   08/16/12 1702  BP: 120/65  Pulse: 89  Temp: 97.3 F (36.3 C)  Resp: 16  Height: 5\' 6"  (1.676 m)  Weight: 125 lb (56.7 kg)   General: frail elderly in no acute distress HEENT: Poor dentition,MMM, PERRLA  Cardiovascular: RRR, no murmurs   Lungs: No respiratory distress but decreased air entry at bases   Abdomen: Soft, + bowel sounds, no tenderness to palpation   Ext: no edema, warm, no obvious deformities but has weakness on right UE, able to move both LE Neuro: alert and oriented to person and place, pleasantly confused  Labs  reviewed: Basic Metabolic Panel:  Recent Labs  96/04/54 0500  08/11/12 0525 08/12/12 1107 08/13/12 0510 08/14/12 0455  NA  --   < > 136 136 138 135  K  --   < > 3.4* 3.4* 3.2* 3.6  CL  --   < > 104 103 102 101  CO2  --   < > 21 23 24 23   GLUCOSE  --   < > 100* 122* 118* 87  BUN  --   < > 7 7 8 7   CREATININE  --   < > 0.79 0.74 0.79 0.77  CALCIUM  --   < > 8.0* 8.1* 8.3* 8.2*  MG 1.5  --  1.9  --  1.8 2.1  PHOS 2.0*  --  1.8*  --  3.4  --   < > = values in this interval not displayed. Liver Function Tests:  Recent Labs  05/26/12 2031 05/31/12 0635 07/02/12 1055 08/07/12 0725  AST 19 23 17 30   ALT 9 15 17 11   ALKPHOS 98 107  --  126*  BILITOT 0.3 0.5  --  0.6  PROT 7.9 7.5  --  7.5  ALBUMIN 4.1 3.4*  --  3.2*    Recent Labs  08/07/12 0725 08/07/12 1200  LIPASE 83* 49   No results found for this basename: AMMONIA,  in the last 8760 hours CBC:  Recent Labs  05/31/12 0635  06/20/12 1152 08/07/12 0725 08/07/12 0735 08/08/12 0500 08/10/12 0530  WBC 12.2*  < > 14.5* 13.3*  --  9.9 9.3  NEUTROABS 8.7*  --  11.1* 12.3*  --   --   --   HGB 14.2  < > 14.5 15.3 16.0 12.5* 11.0*  HCT 42.4  < > 42.2 43.7 47.0 36.6* 32.0*  MCV 85.7  < > 86.1 84.7  --  83.6 83.3  PLT 180  < > 296 244  --  173 178  < > = values in this interval not displayed. Cardiac Enzymes:  Recent Labs  05/26/12 2031  06/10/12 1703 06/10/12 2312 08/07/12 0725  CKTOTAL 229  --  92 97  --   CKMB  --   --  2.7 2.5  --   TROPONINI  --   < > <0.30 <0.30 <0.30  < > = values in this interval not displayed. BNP: No components found with this basename: POCBNP,  CBG:  Recent Labs  08/08/12 1556 08/12/12 2219 08/13/12 0813  GLUCAP 86 103* 103*    Radiological Exams: Dg Chest Port 1 View  08/13/2012  *RADIOLOGY REPORT*  Clinical Data: 79 year old male with acute respiratory failure. Sepsis.  PORTABLE CHEST - 1 VIEW  Comparison: 08/10/2012 and earlier.  Findings: AP portable semi upright view  at 0519 hours.  Mildly improved lung volumes.  Decreased right lung  base airspace disease. Decreased retrocardiac opacity on the left.  No pneumothorax, pleural effusion or pulmonary edema.  Stable cardiac size and mediastinal contours.   Chronic degenerative changes at both shoulders.  IMPRESSION: Regressed right lung base pneumonia.  No new cardiopulmonary abnormality.   Original Report Authenticated By: Erskine Speed, M.D.     Assessment/Plan  Sepsis- in setting of PNA and gastroenteritis. Her with weakness to undergo rehab. To work with pt/ot. Fall precautions. Will have speech therapy also assess for risk for aspiration.   Respiratory failure- in setting of pneumonia. Completed course of augmentin yesterday. With his dementia and poor dentition, he is at aspiration risk. Aspiration precautions  Dementia- on donepezil to help with memory and seroquel to help with his mood. No behavioral changes. Monitor  CVA with late effect- continue asa and plavix. Also continue statin. bp under control currently  Family/ staff Communication: discussed care plan with pt and nursing staff  Goals of care: STR, aspiration precautions  Labs/tests ordered: cbc, cmp

## 2012-09-04 ENCOUNTER — Telehealth: Payer: Self-pay | Admitting: Internal Medicine

## 2012-09-04 NOTE — Telephone Encounter (Signed)
Was calling to confirm appt with pt when wife answered and stated Raymond Rangel is at a nursing home now and she needs to know if he should keep his appt for tomorrow?

## 2012-09-04 NOTE — Telephone Encounter (Signed)
Spoke to pt's wife & she wants the pt to keep the appt with [name].

## 2012-09-04 NOTE — Telephone Encounter (Addendum)
I know he was recently seen by a physician,  Labs were ordered. As long as his care is supervised by the other doctor he does not need to be seen.

## 2012-09-04 NOTE — Telephone Encounter (Signed)
Please advise 

## 2012-09-05 ENCOUNTER — Ambulatory Visit (INDEPENDENT_AMBULATORY_CARE_PROVIDER_SITE_OTHER): Payer: Medicare Other | Admitting: Internal Medicine

## 2012-09-05 VITALS — BP 116/68 | HR 83 | Temp 98.5°F

## 2012-09-05 DIAGNOSIS — J189 Pneumonia, unspecified organism: Secondary | ICD-10-CM

## 2012-09-05 DIAGNOSIS — A419 Sepsis, unspecified organism: Secondary | ICD-10-CM

## 2012-09-05 DIAGNOSIS — E119 Type 2 diabetes mellitus without complications: Secondary | ICD-10-CM

## 2012-09-05 DIAGNOSIS — N390 Urinary tract infection, site not specified: Secondary | ICD-10-CM

## 2012-09-05 DIAGNOSIS — F0390 Unspecified dementia without behavioral disturbance: Secondary | ICD-10-CM

## 2012-09-05 LAB — CBC WITH DIFFERENTIAL/PLATELET
Basophils Absolute: 0 10*3/uL (ref 0.0–0.1)
Eosinophils Relative: 4.8 % (ref 0.0–5.0)
HCT: 39.5 % (ref 39.0–52.0)
Hemoglobin: 13 g/dL (ref 13.0–17.0)
Lymphocytes Relative: 17.1 % (ref 12.0–46.0)
Lymphs Abs: 1.7 10*3/uL (ref 0.7–4.0)
Monocytes Relative: 7.4 % (ref 3.0–12.0)
Platelets: 263 10*3/uL (ref 150.0–400.0)
RDW: 17 % — ABNORMAL HIGH (ref 11.5–14.6)
WBC: 10.2 10*3/uL (ref 4.5–10.5)

## 2012-09-05 LAB — COMPREHENSIVE METABOLIC PANEL
ALT: 13 U/L (ref 0–53)
CO2: 25 mEq/L (ref 19–32)
Calcium: 9.2 mg/dL (ref 8.4–10.5)
Chloride: 101 mEq/L (ref 96–112)
Creatinine, Ser: 0.9 mg/dL (ref 0.4–1.5)
GFR: 107.4 mL/min (ref >60.00)
Glucose, Bld: 85 mg/dL (ref 70–99)
Total Bilirubin: 0.7 mg/dL (ref 0.3–1.2)
Total Protein: 7.9 g/dL (ref 6.0–8.3)
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
## 2012-09-05 NOTE — Assessment & Plan Note (Signed)
He seems to be stable.  Wife concerned about possible depression, very hard to diagnose him w/ depression d/t  dementia. Recommend observation for now.

## 2012-09-05 NOTE — Assessment & Plan Note (Signed)
labs

## 2012-09-05 NOTE — Assessment & Plan Note (Signed)
Recently admitted to the hospital with pneumonia, followup chest x-rays showed improvement of the infiltrate. Now he is asymptomatic.

## 2012-09-05 NOTE — Patient Instructions (Signed)
Next visit in 3 months  

## 2012-09-05 NOTE — Assessment & Plan Note (Signed)
Urine culture 08/07/2012 negative

## 2012-09-05 NOTE — Assessment & Plan Note (Signed)
Seems to be recovering well. Check CBC and CMP

## 2012-09-06 ENCOUNTER — Encounter: Payer: Self-pay | Admitting: Internal Medicine

## 2012-09-07 ENCOUNTER — Non-Acute Institutional Stay (SKILLED_NURSING_FACILITY): Payer: Medicare Other | Admitting: Internal Medicine

## 2012-09-07 ENCOUNTER — Encounter: Payer: Self-pay | Admitting: *Deleted

## 2012-09-07 DIAGNOSIS — R252 Cramp and spasm: Secondary | ICD-10-CM

## 2012-09-07 DIAGNOSIS — R259 Unspecified abnormal involuntary movements: Secondary | ICD-10-CM

## 2012-09-07 DIAGNOSIS — F0150 Vascular dementia without behavioral disturbance: Secondary | ICD-10-CM

## 2012-09-07 DIAGNOSIS — F0151 Vascular dementia with behavioral disturbance: Secondary | ICD-10-CM

## 2012-09-07 DIAGNOSIS — R5381 Other malaise: Secondary | ICD-10-CM | POA: Insufficient documentation

## 2012-09-07 NOTE — Progress Notes (Signed)
Patient ID: Raymond Rangel, male   DOB: November 09, 1931, 79 y.o.   MRN: [id number]  CC- AV- spasticity, decreased therapy participation  HPI-  79 year old male patient with history of vascular dementia and prior CVA was in hospital with acute respiratory failure and required intubation.he was sent to SNF for STR with his weakness. He was working well with therapy until few days back. He has been having increased muscle spasticity with mobility and his participation has decreased. He denies any pain or muscle cramps. He has dementia and is not a reliable historian. No falls reported but he becomes spastic and then goes limp as per therapy. Unable to obtain ros from patient. Obtained from nursing staff  Review of Systems  Constitutional: Negative for fever, chills, malaise/fatigue and diaphoresis.  HENT: Negative for congestion and sore throat.   Respiratory: Negative for cough, sputum production, shortness of breath and wheezing.   Cardiovascular: Negative for chest pain, palpitations, claudication and PND.  Gastrointestinal: Negative for heartburn, nausea, vomiting, abdominal pain and diarrhea.  Genitourinary: Negative for dysuria.  Musculoskeletal: Negative for falls.  Skin: Negative for rash.  Neurological: Positive for weakness. Negative for dizziness, tremors and headaches.  Psychiatric/Behavioral: Positive for memory loss. Negative for depression.   VSS- 117/76, 72, 18, 97.4  General: frail elderly in no acute distress HEENT: Poor dentition,MMM, PERRLA   Cardiovascular: RRR, no murmurs   Lungs: No respiratory distress, CTA Abdomen: Soft, + bowel sounds, no tenderness to palpation   Ext: no edema, warm, no obvious deformities but has weakness on right UE and limited abduction, able to move both LE but has voluntary spasm at rest, on standing, needs maximal assistance, able to take a step but after this has gait spasticity.  Neuro: alert and oriented to person and place, pleasantly  confused  Assessmnet/plan-  Spasticity- will provide muscle relaxant robaxin 500 mg q8h prn for now to help him work with therapy  Debility- could be residual effect of CVA with cognitive decline from dementia precipitating it. Will rule out infection -send u/a with c/s muscle relaxants introduced. Is on asa and plavix. Fall precautions and encourage participation. Affect appears normal  Dementia- continue donepezil and seroquel, monitor

## 2012-09-25 ENCOUNTER — Non-Acute Institutional Stay (SKILLED_NURSING_FACILITY): Payer: Medicare Other | Admitting: Internal Medicine

## 2012-09-25 DIAGNOSIS — J189 Pneumonia, unspecified organism: Secondary | ICD-10-CM

## 2012-09-25 DIAGNOSIS — R252 Cramp and spasm: Secondary | ICD-10-CM

## 2012-09-25 DIAGNOSIS — F01518 Vascular dementia, unspecified severity, with other behavioral disturbance: Secondary | ICD-10-CM

## 2012-09-25 DIAGNOSIS — R5381 Other malaise: Secondary | ICD-10-CM

## 2012-09-25 DIAGNOSIS — I639 Cerebral infarction, unspecified: Secondary | ICD-10-CM

## 2012-09-25 DIAGNOSIS — J9600 Acute respiratory failure, unspecified whether with hypoxia or hypercapnia: Secondary | ICD-10-CM

## 2012-09-25 DIAGNOSIS — I6350 Cerebral infarction due to unspecified occlusion or stenosis of unspecified cerebral artery: Secondary | ICD-10-CM

## 2012-09-25 DIAGNOSIS — R259 Unspecified abnormal involuntary movements: Secondary | ICD-10-CM

## 2012-09-25 DIAGNOSIS — F0150 Vascular dementia without behavioral disturbance: Secondary | ICD-10-CM

## 2012-09-25 DIAGNOSIS — F0151 Vascular dementia with behavioral disturbance: Secondary | ICD-10-CM

## 2012-09-25 NOTE — Progress Notes (Signed)
Patient ID: Raymond Rangel., male   DOB: [birth year], 79 y.o.   MRN: [id number] Location:  [facility] Yovanny Coats L. Renato Gails, D.O., C.M.D.  PCP: [name], MD  No Known Allergies  Chief Complaint: discharge home with home health  HPI:  79 yo male with h/o dementia here for short term rehab s/p hospitalization for pneumonia with sepsis.  Has improved to where he can go home for continued home health therapy  Review of Systems:  Review of Systems  Unable to perform ROS: dementia     Past Medical History  Diagnosis Date  . Stroke 04-2011  . Irregular heart beat   . Dementia   . DVT (deep venous thrombosis)     L leg 04-2011  . Hypercholesteremia   . Arthritis   . Diabetes mellitus   . Infectious colitis 08/07/2012  . CAD (coronary artery disease)     stent 2005  . DJD (degenerative joint disease)   . H/O ETOH abuse     Past Surgical History  Procedure Laterality Date  . Left arm    . Rotator cuff repair  2000(L) 1999 (R)    Social History:   reports that he quit smoking about 54 years ago. His smoking use included Cigarettes. He has a 20 pack-year smoking history. He has never used smokeless tobacco. He reports that he does not drink alcohol or use illicit drugs.  Family History  Problem Relation Age of Onset  . Heart disease Mother   . Diabetes Mother   . Colon cancer Neg Hx   . Prostate cancer Neg Hx   . Heart disease Father     Medications: Patient's Medications  New Prescriptions   ATORVASTATIN (LIPITOR) 40 MG TABLET    Take 1 tablet (40 mg total) by mouth daily.   DONEPEZIL (ARICEPT) 5 MG TABLET    Take 1 tablet (5 mg total) by mouth at bedtime.   VITAMIN B-12 1000 MCG TABLET    Take 1 tablet (1,000 mcg total) by mouth daily.  Previous Medications   ASPIRIN 81 MG CHEWABLE TABLET    Chew 1 tablet (81 mg total) by mouth daily.   BACLOFEN (LIORESAL) 10 MG TABLET    Take 10 mg by mouth 3 (three) times daily.   CLOPIDOGREL (PLAVIX) 75 MG TABLET    Take  75 mg by mouth daily.   METOPROLOL TARTRATE (LOPRESSOR) 25 MG TABLET    Take 25 mg by mouth 2 (two) times daily.  Modified Medications   Modified Medication Previous Medication   FEEDING SUPPLEMENT, ENSURE COMPLETE, (ENSURE COMPLETE) LIQD feeding supplement (ENSURE COMPLETE) LIQD      Take 237 mLs by mouth 3 (three) times daily between meals.    Take 237 mL by mouth 2 (two) times daily between meals.   QUETIAPINE (SEROQUEL) 50 MG TABLET QUEtiapine (SEROQUEL) 50 MG tablet      Take 1 tablet (50 mg total) by mouth 2 (two) times daily.    Take 1 tablet (50 mg total) by mouth 2 (two) times daily.  Discontinued Medications   ATORVASTATIN (LIPITOR) 40 MG TABLET    Take 1 tablet (40 mg total) by mouth daily.   CLOPIDOGREL (PLAVIX) 75 MG TABLET    Take 1 tablet (75 mg total) by mouth daily with breakfast.   DONEPEZIL (ARICEPT) 5 MG TABLET    Take 5 mg by mouth at bedtime.    QUETIAPINE (SEROQUEL) 25 MG TABLET    Take 1 tablet (25  mg total) by mouth 2 (two) times daily.     Physical Exam: Filed Vitals:   03/22/13 1527  BP: 98/77  Pulse: 94  Temp: 97.6 F (36.4 C)  Resp: 18  Height: 5\' 6"  (1.676 m)  Weight: 125 lb (56.7 kg)   Physical Exam  Constitutional:  Frail male  HENT:  Head: Normocephalic and atraumatic.  Cardiovascular: Normal rate, regular rhythm, normal heart sounds and intact distal pulses.   Pulmonary/Chest: Effort normal and breath sounds normal. No respiratory distress.  Abdominal: Soft. Bowel sounds are normal. He exhibits no distension. There is no tenderness.  Musculoskeletal: He exhibits no edema and no tenderness.  Neurological: He is alert.  Skin: Skin is warm and dry.     Labs reviewed: Basic Metabolic Panel:  Recent Labs  45/40/98 0500  08/11/12 0525  08/13/12 0510 08/14/12 0455  11/27/12 0545 02/13/13 1921 02/14/13 0500 02/15/13 0428  NA  --   < > 136  < > 138 135  < > 138 137 140 139  K  --   < > 3.4*  < > 3.2* 3.6  < > 3.9 3.2* 2.9* 4.5  CL  --    < > 104  < > 102 101  < > 101 102 106 108  CO2  --   < > 21  < > 24 23  < > 23 24 23 22   GLUCOSE  --   < > 100*  < > 118* 87  < > 119* 94 92 97  BUN  --   < > 7  < > 8 7  < > 8 8 7 10   CREATININE  --   < > 0.79  < > 0.79 0.77  < > 0.76 0.89 0.74 0.78  CALCIUM  --   < > 8.0*  < > 8.3* 8.2*  < > 9.4 9.1 8.5 8.7  MG 1.5  --  1.9  --  1.8 2.1  --  2.3  --   --   --   PHOS 2.0*  --  1.8*  --  3.4  --   --   --   --   --   --   < > = values in this interval not displayed. Liver Function Tests:  Recent Labs  11/24/12 0126 02/13/13 1921 02/14/13 0500  AST 16 19 16   ALT 8 10 8   ALKPHOS 130* 149* 126*  BILITOT 0.3 0.3 0.3  PROT 8.1 8.1 6.8  ALBUMIN 3.7 3.5 3.1*    Recent Labs  08/07/12 0725 08/07/12 1200  LIPASE 83* 49   No results found for this basename: AMMONIA,  in the last 8760 hours CBC:  Recent Labs  11/24/12 0126  02/13/13 1921 02/14/13 0500 02/15/13 0428  WBC 11.6*  < > 9.9 7.2 9.3  NEUTROABS 8.2*  --  5.8 4.2  --   HGB 14.0  < > 10.9* 11.4* 12.6*  HCT 40.1  < > 31.8* 32.8* 37.4*  MCV 84.1  < > 86.4 84.8 87.6  PLT 262  < > 297 214 235  < > = values in this interval not displayed. Cardiac Enzymes:  Recent Labs  05/26/12 2031  06/10/12 1703 06/10/12 2312 08/07/12 0725 11/24/12 0127  CKTOTAL 229  --  92 97  --   --   CKMB  --   --  2.7 2.5  --   --   TROPONINI  --   < > <0.30 <0.30 <0.30 <0.30  < > =  values in this interval not displayed. BNP: No components found with this basename: POCBNP,  CBG:  Recent Labs  02/18/13 0618 02/18/13 1119 02/18/13 1636  GLUCAP 99 145* 120*  Assessment/Plan:   1. Acute respiratory failure -resolved, due to #2, caused worsened deconditioning and required pt, ot st here  2. Healthcare-associated pneumonia -completed abx  3. CVA (cerebral infarction) -completed his short term rehab here -continue pt, ot, st at home  4. Vascular dementia with behavior disturbance -due to strokes in past -family wants to take him  back home again  5. Debility -cont therapy  6. Spasticity Improved since addition of medication during acute visit  Patient is being discharged with home health services:  PT, OT, ST, home health aide  Patient is being discharged with the following durable medical equipment:  None needed as of today.  Patient has been advised to f/u with their PCP in 1-2 weeks to bring them up to date on their rehab stay.  They were provided with a 30 day supply of scripts for prescription medications and refills must be obtained from their PCP.

## 2012-09-28 ENCOUNTER — Telehealth: Payer: Self-pay | Admitting: *Deleted

## 2012-09-28 NOTE — Telephone Encounter (Signed)
Verbal orders need for physical therapy, gait training, transfer exercise. This would occur 1x a week for 1 week, 3x a week for  2 weeks, 2x a week for 2weeks .Please advise ok to give orders

## 2012-09-30 NOTE — Telephone Encounter (Signed)
Ok to give orders  °

## 2012-10-01 NOTE — Telephone Encounter (Signed)
Left detail VM of verbal orders.

## 2012-10-05 ENCOUNTER — Telehealth: Payer: Self-pay | Admitting: Internal Medicine

## 2012-10-05 DIAGNOSIS — L8990 Pressure ulcer of unspecified site, unspecified stage: Secondary | ICD-10-CM

## 2012-10-05 NOTE — Telephone Encounter (Signed)
[name] patient's homecare nurs is calling to see if [name] can order an Alternating pressure mattress for the patient because the one he has currently is a gel mattress and he has bed sores. This order can be faxed to [facility] at [phone].

## 2012-10-05 NOTE — Telephone Encounter (Signed)
Order entered/faxed

## 2012-10-11 ENCOUNTER — Telehealth: Payer: Self-pay | Admitting: Internal Medicine

## 2012-10-11 NOTE — Telephone Encounter (Signed)
Noted, will forward message to patient's PCP as a FYI

## 2012-10-11 NOTE — Telephone Encounter (Signed)
Harriett Sine with [facility] is calling just to give an update with the patient's current status. She says that the patient's wife keeps telling her that the patient is contipated, however, she knows that he had a large bowel movement on Tuesday. He reports No pain. She has advised him to get prune juice from grocery store today. No call back requested.

## 2012-10-14 NOTE — Telephone Encounter (Signed)
THX!

## 2012-10-15 DIAGNOSIS — I69998 Other sequelae following unspecified cerebrovascular disease: Secondary | ICD-10-CM

## 2012-10-15 DIAGNOSIS — I69920 Aphasia following unspecified cerebrovascular disease: Secondary | ICD-10-CM

## 2012-10-15 DIAGNOSIS — E119 Type 2 diabetes mellitus without complications: Secondary | ICD-10-CM

## 2012-10-15 DIAGNOSIS — L8992 Pressure ulcer of unspecified site, stage 2: Secondary | ICD-10-CM

## 2012-10-22 ENCOUNTER — Ambulatory Visit (INDEPENDENT_AMBULATORY_CARE_PROVIDER_SITE_OTHER): Payer: Medicare Other | Admitting: Internal Medicine

## 2012-10-22 ENCOUNTER — Encounter: Payer: Self-pay | Admitting: Internal Medicine

## 2012-10-22 VITALS — BP 145/90 | HR 84 | Temp 97.6°F

## 2012-10-22 DIAGNOSIS — I2510 Atherosclerotic heart disease of native coronary artery without angina pectoris: Secondary | ICD-10-CM

## 2012-10-22 DIAGNOSIS — F0391 Unspecified dementia with behavioral disturbance: Secondary | ICD-10-CM

## 2012-10-22 DIAGNOSIS — E119 Type 2 diabetes mellitus without complications: Secondary | ICD-10-CM

## 2012-10-22 MED ORDER — DONEPEZIL HCL 5 MG PO TABS: 5.0000 mg | ORAL_TABLET | Freq: Every day | ORAL | Status: DC

## 2012-10-22 MED ORDER — QUETIAPINE FUMARATE 50 MG PO TABS: 50.0000 mg | ORAL_TABLET | Freq: Two times a day (BID) | ORAL | Status: DC

## 2012-10-22 NOTE — Assessment & Plan Note (Addendum)
BP today slightly elevated but  usually okay. Recheck on return to the office

## 2012-10-22 NOTE — Patient Instructions (Addendum)
Continue with same medications,  the only change is increase Seroquel from 25 mg twice a day to 50 mg twice a day, you have a new prescription Next visit in 2 months

## 2012-10-22 NOTE — Assessment & Plan Note (Signed)
Labs on return to the office

## 2012-10-22 NOTE — Assessment & Plan Note (Signed)
Wife has a hard time delivering total care, she is counseled to the best of my ability. She is already looking in for help through [facility]. Will continue with Aricept 5 mg each bedtime Increase Seroquel from 25 twice a day to 50 mg twice a day. Currently the patient is physically doing well, recommend to watch for fevers, constipation, difficulty urinating, acute worsening of mental status et Karie Soda. Plan: Return to the office in 2 months

## 2012-10-22 NOTE — Progress Notes (Signed)
  Subjective:    Patient ID: Raymond Rangel, male    DOB: [birth year], 79 y.o.   MRN: [id number]  HPI ROV, Here with his wife and son. Was discharged home from [facility]  about a month ago, he has been getting physical therapy and an aid visit  weekly but that ends  today. Wife is providing total care, he is wheelchair-bound and demented. He has not been very cooperative with her, at times gets verbally violent to his wife.   PMH  Coronary artery disease, stent in 2005  Hyperlipidemia  Diabetes  "Irregular heartbeat"  Bacteremia 10/2010  Dementia  History of mini strokes  DJD  H/o ETOH  DVT left leg 04/2011  Stroke 2-14  Past Surgical History  Procedure Laterality Date  . Left arm    . Rotator cuff repair  2000(L) 1999 (R)      PSH  Married, retired, 3 daughters and one son  D/c from [facility] ~ 6-14 14 Former alcohol abuse  Quit tobacco in the 38s  Wife reports today she knows in the past the patient abused Rx Medications    Review of Systems, per wife  No fever chills No crying speels No diff swallowing No  CP, SOB  No nausea, vomiting  "breathing hard yesterday", can't describe further  No cough. No wheezing  No dysuria, gross hematuria, difficulty urinating   Admits to: Diarrhea x few days , no blood     Objective:   Physical Exam Wheelchair-bound  General -- alert, well-developed, NAD.   Lungs -- normal respiratory effort, no intercostal retractions, no accessory muscle use, and normal breath sounds.   Heart-- normal rate, regular rhythm, no murmur, and no gallop.   Psych--  Did not recognize his son or wife (thought they were his brother and sisters) he knows his DOB otherwise is disoriented     Assessment & Plan:  Today , I spent more than 25 min with the patient, >50% of the time counseling

## 2012-10-23 ENCOUNTER — Telehealth: Payer: Self-pay | Admitting: Internal Medicine

## 2012-10-23 NOTE — Telephone Encounter (Signed)
[name] with [facility] is calling to request an order for a Low Air loss Group 2 mattress for the patient. The previous mattress he was ordered was within the same group and he needs a mattress in the next group to accommodate his needs.  [name] needs a call back to confirm that the order can be sent. Fax#: [phone]

## 2012-10-23 NOTE — Telephone Encounter (Signed)
Agree, please send a Rx

## 2012-10-23 NOTE — Telephone Encounter (Signed)
Obtained manual rx per [name] and faxed RX to [name] at [facility] at [phone]

## 2012-10-23 NOTE — Telephone Encounter (Signed)
[name] only received the cover page of the order that was faxed and is asking for the order to be re-sent.

## 2012-10-23 NOTE — Telephone Encounter (Signed)
Resent rx to [name].

## 2012-10-23 NOTE — Telephone Encounter (Signed)
Please advise if placing this order is appropriate. Thanks.

## 2012-10-24 ENCOUNTER — Telehealth: Payer: Self-pay | Admitting: Internal Medicine

## 2012-10-24 ENCOUNTER — Ambulatory Visit (INDEPENDENT_AMBULATORY_CARE_PROVIDER_SITE_OTHER): Payer: Medicare Other | Admitting: Neurology

## 2012-10-24 ENCOUNTER — Encounter: Payer: Self-pay | Admitting: Neurology

## 2012-10-24 VITALS — BP 141/84 | HR 79 | Temp 97.9°F

## 2012-10-24 DIAGNOSIS — G8110 Spastic hemiplegia affecting unspecified side: Secondary | ICD-10-CM

## 2012-10-24 NOTE — Telephone Encounter (Signed)
Left message to return call to office.

## 2012-10-24 NOTE — Progress Notes (Signed)
[facility] [address] [place]. [place] [address] [phone]       OFFICE FOLLOW-UP NOTE  [name] Date of Birth:  30-Sep-1931 Medical Record Number:  [id number]   HPI: Raymond Rangel is a 79 year old African American gentleman seen for the first of his followup visit following hospital admission for stroke 03/25/13. He presented with sudden onset of slurred speech and right-sided weakness. She presented be on time window for intervention. He had history of mild baseline dementia. MRI scans of the brain showed an acute left posterior cerebral artery infarct without hemorrhage. MRA of the brain showed 50% mid basilar artery stenosis and occlusion of the left proximal posterior cerebral artery. Carotid Doppler showed no significant extracranial stenosis. Transthoracic echo showed normal ejection fraction. Hemoglobin A1c was 6.7. Total cholesterol was 204, HDL 40, LDL was elevated at 148 mg percent. Patient was started on aspirin and Plavix for stroke prevention given intracranial large vessel disease. He was sent to inpatient rehabilitation  and showed significant improvement  . He is currently finishing home physical and occupational therapy but still requires a lot of help and can barely walk even with one-person assist and a walker. The who wife and son statement he is has issues with judgment and following instructions and have noticed for further cognitive decline since his stroke. Patient complains of pain in his arm and leg due to spasticity and has been started on baclofen for this. He and family interested in considering Botox therapy and we will refer to Dr. Hosie Poisson for the same  ROS:   14 system review of systems is positive for pain, weakness, difficulty walking, confusion, memory loss, depression, snoring, sleepiness, restless legs A. Chen and joint pain. Weight loss.  PMH:  Past Medical History  Diagnosis Date  . Mini stroke   . Irregular heart beat   . Blood  clot in vein     left leg dvt  . Dementia   . DVT (deep venous thrombosis)   . Hypercholesteremia   . Arthritis   . Diabetes mellitus   . Infectious colitis 08/07/2012  . Dysrhythmia     Social History:  History   Social History  . Marital Status: Married    Spouse Name: [name]    Number of Children: 4  . Years of Education: [employer]   Occupational History  . retired    Social History Main Topics  . Smoking status: Former Smoker -- 1.00 packs/day for 20 years    Types: Cigarettes    Quit date: 06/07/1958  . Smokeless tobacco: Never Used  . Alcohol Use: No  . Drug Use: No  . Sexually Active: Not on file   Other Topics Concern  . Not on file   Social History Narrative   Patient lives at home with family.   Caffeine Use: none     Medications:   Current Outpatient Prescriptions on File Prior to Visit  Medication Sig Dispense Refill  . aspirin 81 MG chewable tablet Chew 1 tablet (81 mg total) by mouth daily.      Marland Kitchen atorvastatin (LIPITOR) 40 MG tablet Take 1 tablet (40 mg total) by mouth daily.  30 tablet  1  . baclofen (LIORESAL) 10 MG tablet Take 10 mg by mouth 3 (three) times daily.      . clopidogrel (PLAVIX) 75 MG tablet Take 1 tablet (75 mg total) by mouth daily with breakfast.  30 tablet  1  . donepezil (ARICEPT) 5 MG tablet  Take 1 tablet (5 mg total) by mouth at bedtime.  30 tablet  6  . QUEtiapine (SEROQUEL) 50 MG tablet Take 1 tablet (50 mg total) by mouth 2 (two) times daily.  60 tablet  6   No current facility-administered medications on file prior to visit.    Allergies:  No Known Allergies Filed Vitals:   10/24/12 1419  BP: 141/84  Pulse: 79  Temp: 97.9 F (36.6 C)    Physical Exam General: frail elderly african american male seated, in no evident distress Head: head normocephalic and atraumatic. Orohparynx benign Neck: supple with no carotid or supraclavicular bruits Cardiovascular: regular rate and rhythm, no murmurs Musculoskeletal: no  deformity Skin:  no rash/petichiae Vascular:  Normal pulses all extremities  Neurologic Exam Mental Status: Awake and fully alert. Oriented to place and time. Recent and remote memory diminished. Attention span, concentration and fund of knowledge diminished Mood and affect appropriate. Mini-Mental status exam 10/30 patient was uncooperative and refused a lot of testing.. Animal fluency test 7. Cranial Nerves: Fundoscopic exam reveals sharp disc margins. Pupils equal, briskly reactive to light. Extraocular movements full without nystagmus. Visual fields show partial right homonymous hemianopsia to confrontation. Hearing intact. Facial sensation intact. Face, tongue, palate moves normally and symmetrically.  Motor: Spastic right hemiparesis with 3/5 right upper extremity and 4/5 right lower extremity strength. Weakness of the right grip, intrinsic hand muscles, right hip flexors and right ankle dorsiflexors. Mild right foot drop. Tone increased on the right with spasticity. Normal strength on the left. Sensory.: intact to tough and pinprick and vibratory.  Coordination: Rapid alternating movements impaired on the right and normal on the left extremities.  Gait and Station: Deferred as per history patient is unsteady even with a walker and needs 1 person assist Reflexes: 1+ and symmetric. Toes downgoing.     ASSESSMENT: 79 year old African American male with left posterior cerebral artery infarct in February 2014  and residual right spastic right hemiplegia secondary to intracranial moderate basilar artery and left posterior cerebral artery occlusion. Vascular risk factors of diabetes, hypertension, hyperlipidemia and intracranial atherosclerosis. History of mild baseline dementia.    PLAN: Continue Plavix alone and discontinue aspirin for stroke prevention as it has been more than 3 months since his stroke.Maintain strict control of lipids with LDL cholesterol goal below 100 mg percent and refer to  Dr. Hosie Poisson for Botox and spasticity management. Outpatient physical and occupational therapy referral.

## 2012-10-24 NOTE — Telephone Encounter (Signed)
Patient's wife called stating the patient needs new rx for seroquel with updated dosing. [facility] [address].

## 2012-10-24 NOTE — Patient Instructions (Addendum)
Continue Plavix alone and discontinue aspirin for stroke prevention with strict control of lipids with LDL cholesterol goal below 100 mg percent and refer to Dr. Hosie Poisson for Botox and spasticity management. Outpatient physical and occupational therapy referral.

## 2012-10-25 ENCOUNTER — Other Ambulatory Visit: Payer: Self-pay | Admitting: Internal Medicine

## 2012-10-25 ENCOUNTER — Telehealth: Payer: Self-pay | Admitting: Internal Medicine

## 2012-10-25 MED ORDER — QUETIAPINE FUMARATE 50 MG PO TABS: 50.0000 mg | ORAL_TABLET | Freq: Two times a day (BID) | ORAL | Status: DC

## 2012-10-25 NOTE — Telephone Encounter (Addendum)
[name] with [facility] is calling to let us know that the patient has been discharged from nursing services.

## 2012-10-25 NOTE — Telephone Encounter (Signed)
Noted  

## 2012-10-25 NOTE — Telephone Encounter (Signed)
FYI

## 2012-10-25 NOTE — Telephone Encounter (Signed)
Spoke with patient's wife, she did not receive a printed rx at the appointment on Monday 10/22/12. Spoke with patient's pharmacy, they did not receive rx electronically. Resent rx to pharmacy. Pt. Wife updated on this information.

## 2012-11-24 ENCOUNTER — Inpatient Hospital Stay (HOSPITAL_COMMUNITY): Payer: Medicare Other

## 2012-11-24 ENCOUNTER — Encounter (HOSPITAL_COMMUNITY): Payer: Self-pay | Admitting: *Deleted

## 2012-11-24 ENCOUNTER — Emergency Department (HOSPITAL_COMMUNITY): Payer: Medicare Other

## 2012-11-24 ENCOUNTER — Inpatient Hospital Stay (HOSPITAL_COMMUNITY)
Admission: EM | Admit: 2012-11-24 | Discharge: 2012-11-27 | DRG: 689 | Disposition: A | Discharge status: Skilled Nursing Facility | Payer: Medicare Other | Attending: Internal Medicine | Admitting: Internal Medicine

## 2012-11-24 DIAGNOSIS — R5381 Other malaise: Secondary | ICD-10-CM

## 2012-11-24 DIAGNOSIS — I639 Cerebral infarction, unspecified: Secondary | ICD-10-CM

## 2012-11-24 DIAGNOSIS — G9340 Encephalopathy, unspecified: Secondary | ICD-10-CM

## 2012-11-24 DIAGNOSIS — E785 Hyperlipidemia, unspecified: Secondary | ICD-10-CM | POA: Diagnosis present

## 2012-11-24 DIAGNOSIS — E43 Unspecified severe protein-calorie malnutrition: Secondary | ICD-10-CM | POA: Insufficient documentation

## 2012-11-24 DIAGNOSIS — M25511 Pain in right shoulder: Secondary | ICD-10-CM

## 2012-11-24 DIAGNOSIS — F01518 Vascular dementia, unspecified severity, with other behavioral disturbance: Secondary | ICD-10-CM | POA: Diagnosis present

## 2012-11-24 DIAGNOSIS — F0151 Vascular dementia with behavioral disturbance: Secondary | ICD-10-CM

## 2012-11-24 DIAGNOSIS — N390 Urinary tract infection, site not specified: Principal | ICD-10-CM | POA: Diagnosis present

## 2012-11-24 DIAGNOSIS — H53469 Homonymous bilateral field defects, unspecified side: Secondary | ICD-10-CM

## 2012-11-24 DIAGNOSIS — F0390 Unspecified dementia without behavioral disturbance: Secondary | ICD-10-CM

## 2012-11-24 DIAGNOSIS — I69993 Ataxia following unspecified cerebrovascular disease: Secondary | ICD-10-CM

## 2012-11-24 DIAGNOSIS — F0150 Vascular dementia without behavioral disturbance: Secondary | ICD-10-CM | POA: Diagnosis present

## 2012-11-24 DIAGNOSIS — I672 Cerebral atherosclerosis: Secondary | ICD-10-CM | POA: Diagnosis present

## 2012-11-24 DIAGNOSIS — E46 Unspecified protein-calorie malnutrition: Secondary | ICD-10-CM

## 2012-11-24 DIAGNOSIS — G9341 Metabolic encephalopathy: Secondary | ICD-10-CM | POA: Diagnosis present

## 2012-11-24 DIAGNOSIS — I2510 Atherosclerotic heart disease of native coronary artery without angina pectoris: Secondary | ICD-10-CM | POA: Diagnosis present

## 2012-11-24 DIAGNOSIS — E119 Type 2 diabetes mellitus without complications: Secondary | ICD-10-CM | POA: Diagnosis present

## 2012-11-24 DIAGNOSIS — R252 Cramp and spasm: Secondary | ICD-10-CM

## 2012-11-24 DIAGNOSIS — E872 Acidosis: Secondary | ICD-10-CM

## 2012-11-24 DIAGNOSIS — I69959 Hemiplegia and hemiparesis following unspecified cerebrovascular disease affecting unspecified side: Secondary | ICD-10-CM

## 2012-11-24 LAB — CBC WITH DIFFERENTIAL/PLATELET
Basophils Absolute: 0.1 10*3/uL (ref 0.0–0.1)
Basophils Relative: 0 % (ref 0–1)
Eosinophils Absolute: 0.2 10*3/uL (ref 0.0–0.7)
Hemoglobin: 14 g/dL (ref 13.0–17.0)
MCH: 29.4 pg (ref 26.0–34.0)
MCHC: 34.9 g/dL (ref 30.0–36.0)
Neutro Abs: 8.2 10*3/uL — ABNORMAL HIGH (ref 1.7–7.7)
Neutrophils Relative %: 70 % (ref 43–77)
Platelets: 262 10*3/uL (ref 150–400)
RDW: 14.7 % (ref 11.5–15.5)

## 2012-11-24 LAB — URINALYSIS, ROUTINE W REFLEX MICROSCOPIC
Hgb urine dipstick: NEGATIVE
Nitrite: POSITIVE — AB
Protein, ur: NEGATIVE mg/dL
Specific Gravity, Urine: 1.013 (ref 1.005–1.030)
Urobilinogen, UA: 0.2 mg/dL (ref 0.0–1.0)

## 2012-11-24 LAB — COMPREHENSIVE METABOLIC PANEL
AST: 16 U/L (ref 0–37)
Albumin: 3.7 g/dL (ref 3.5–5.2)
Alkaline Phosphatase: 130 U/L — ABNORMAL HIGH (ref 39–117)
BUN: 8 mg/dL (ref 6–23)
Chloride: 100 mEq/L (ref 96–112)
Creatinine, Ser: 0.64 mg/dL (ref 0.50–1.35)
Potassium: 3.3 mEq/L — ABNORMAL LOW (ref 3.5–5.1)
Total Protein: 8.1 g/dL (ref 6.0–8.3)

## 2012-11-24 LAB — POCT I-STAT, CHEM 8
Chloride: 103 mEq/L (ref 96–112)
HCT: 43 % (ref 39.0–52.0)
Hemoglobin: 14.6 g/dL (ref 13.0–17.0)
Potassium: 3.5 mEq/L (ref 3.5–5.1)
Sodium: 140 mEq/L (ref 135–145)

## 2012-11-24 LAB — URINE MICROSCOPIC-ADD ON

## 2012-11-24 LAB — TROPONIN I: Troponin I: <0.3 ng/mL (ref <0.30)

## 2012-11-24 LAB — PROTIME-INR: Prothrombin Time: 12.5 seconds (ref 11.6–15.2)

## 2012-11-24 LAB — CG4 I-STAT (LACTIC ACID): Lactic Acid, Venous: 1.94 mmol/L (ref 0.5–2.2)

## 2012-11-24 MED ORDER — DEXTROSE 5 % IV SOLN
1.0000 g | INTRAVENOUS | Status: DC
  Administered 2012-11-25 – 2012-11-26 (×3): 1 g via INTRAVENOUS
  Filled 2012-11-24 (×5): qty 10

## 2012-11-24 MED ORDER — QUETIAPINE FUMARATE 50 MG PO TABS
50.0000 mg | ORAL_TABLET | Freq: Two times a day (BID) | ORAL | Status: DC
  Administered 2012-11-24 – 2012-11-27 (×7): 50 mg via ORAL
  Filled 2012-11-24 (×8): qty 1

## 2012-11-24 MED ORDER — ENOXAPARIN SODIUM 40 MG/0.4ML ~~LOC~~ SOLN
40.0000 mg | SUBCUTANEOUS | Status: DC
  Administered 2012-11-24: 40 mg via SUBCUTANEOUS
  Filled 2012-11-24 (×2): qty 0.4

## 2012-11-24 MED ORDER — SODIUM CHLORIDE 0.9 % IV BOLUS (SEPSIS)
1000.0000 mL | Freq: Once | INTRAVENOUS | Status: AC
  Administered 2012-11-24: 1000 mL via INTRAVENOUS

## 2012-11-24 MED ORDER — ACETAMINOPHEN 650 MG RE SUPP: 650.0000 mg | Freq: Four times a day (QID) | RECTAL | Status: DC | PRN

## 2012-11-24 MED ORDER — ZOLPIDEM TARTRATE 5 MG PO TABS: 5.0000 mg | ORAL_TABLET | Freq: Every evening | ORAL | Status: DC | PRN

## 2012-11-24 MED ORDER — SODIUM CHLORIDE 0.9 % IJ SOLN
3.0000 mL | Freq: Two times a day (BID) | INTRAMUSCULAR | Status: DC
  Administered 2012-11-24 – 2012-11-27 (×6): 3 mL via INTRAVENOUS

## 2012-11-24 MED ORDER — SODIUM CHLORIDE 0.9 % IV SOLN: INTRAVENOUS | Status: AC

## 2012-11-24 MED ORDER — CLOPIDOGREL BISULFATE 75 MG PO TABS
75.0000 mg | ORAL_TABLET | Freq: Every day | ORAL | Status: DC
  Administered 2012-11-24 – 2012-11-27 (×4): 75 mg via ORAL
  Filled 2012-11-24 (×4): qty 1

## 2012-11-24 MED ORDER — ENSURE COMPLETE PO LIQD
237.0000 mL | Freq: Two times a day (BID) | ORAL | Status: DC
  Administered 2012-11-24 – 2012-11-27 (×6): 237 mL via ORAL

## 2012-11-24 MED ORDER — ONDANSETRON HCL 4 MG/2ML IJ SOLN: 4.0000 mg | Freq: Three times a day (TID) | INTRAMUSCULAR | Status: DC | PRN

## 2012-11-24 MED ORDER — DIPHENHYDRAMINE HCL 25 MG PO CAPS: 25.0000 mg | ORAL_CAPSULE | Freq: Every evening | ORAL | Status: DC | PRN

## 2012-11-24 MED ORDER — ACETAMINOPHEN 325 MG PO TABS: 650.0000 mg | ORAL_TABLET | Freq: Four times a day (QID) | ORAL | Status: DC | PRN

## 2012-11-24 MED ORDER — ATORVASTATIN CALCIUM 40 MG PO TABS
40.0000 mg | ORAL_TABLET | Freq: Every day | ORAL | Status: DC
  Administered 2012-11-24 – 2012-11-27 (×4): 40 mg via ORAL
  Filled 2012-11-24 (×4): qty 1

## 2012-11-24 MED ORDER — ONDANSETRON HCL 4 MG PO TABS: 4.0000 mg | ORAL_TABLET | Freq: Four times a day (QID) | ORAL | Status: DC | PRN

## 2012-11-24 MED ORDER — SODIUM CHLORIDE 0.9 % IV SOLN
Freq: Once | INTRAVENOUS | Status: AC
  Administered 2012-11-24: 05:00:00 via INTRAVENOUS

## 2012-11-24 MED ORDER — ASPIRIN 81 MG PO CHEW
81.0000 mg | CHEWABLE_TABLET | Freq: Every day | ORAL | Status: DC
  Administered 2012-11-24 – 2012-11-27 (×4): 81 mg via ORAL
  Filled 2012-11-24 (×4): qty 1

## 2012-11-24 MED ORDER — ONDANSETRON HCL 4 MG/2ML IJ SOLN: 4.0000 mg | Freq: Four times a day (QID) | INTRAMUSCULAR | Status: DC | PRN

## 2012-11-24 MED ORDER — DEXTROSE 5 % IV SOLN
1.0000 g | Freq: Once | INTRAVENOUS | Status: AC
  Administered 2012-11-24: 03:00:00 via INTRAVENOUS
  Filled 2012-11-24: qty 10

## 2012-11-24 MED ORDER — BACLOFEN 10 MG PO TABS
10.0000 mg | ORAL_TABLET | Freq: Three times a day (TID) | ORAL | Status: DC
  Administered 2012-11-24 – 2012-11-27 (×10): 10 mg via ORAL
  Filled 2012-11-24 (×12): qty 1

## 2012-11-24 MED ORDER — DONEPEZIL HCL 5 MG PO TABS
5.0000 mg | ORAL_TABLET | Freq: Every day | ORAL | Status: DC
  Administered 2012-11-24 – 2012-11-26 (×3): 5 mg via ORAL
  Filled 2012-11-24 (×4): qty 1

## 2012-11-24 NOTE — ED Notes (Signed)
Slurred speech (intermittent0 x 2 days. Worse Tonite at 1800. Hx. Of stroke with rt. Sided deficits. Non noted by ems. Hx. Of sun downers. According to son, "slurred speech is common, esp. At night." Tonite: combative and incontinent. Urine is dark, foul odor. C/o cold.

## 2012-11-24 NOTE — ED Provider Notes (Signed)
CSN: [id number]     Arrival date & time 11/24/12  0049 History     First MD Initiated Contact with Patient 11/24/12 0054     Chief Complaint  Patient presents with  . Stroke Symptoms  . Urinary Tract Infection   (Consider location/radiation/quality/duration/timing/severity/associated sxs/prior Treatment) HPI Comments: Patient presents via EMS with intermittent slurred speech for the past 2 days. Apparently became worse tonight around 6 PM. History of previous stroke with right-sided weakness. Found to have foul-smelling urine dark colored urine. Patient is oriented to himself denies any complaints. Report of increased combativeness at home. She denies chest pain, abdominal pain, headache, fever chills.  The history is provided by the patient and the EMS personnel. The history is limited by the condition of the patient.    Past Medical History  Diagnosis Date  . Mini stroke   . Irregular heart beat   . Blood clot in vein     left leg dvt  . Dementia   . DVT (deep venous thrombosis)   . Hypercholesteremia   . Arthritis   . Diabetes mellitus   . Infectious colitis 08/07/2012  . Dysrhythmia    Past Surgical History  Procedure Laterality Date  . Left arm    . Rotator cuff repair  2000(L) 1999 (R)   Family History  Problem Relation Age of Onset  . Heart disease Mother   . Diabetes Mother   . Colon cancer Neg Hx   . Prostate cancer Neg Hx   . Heart disease Father    History  Substance Use Topics  . Smoking status: Former Smoker -- 1.00 packs/day for 20 years    Types: Cigarettes    Quit date: 06/07/1958  . Smokeless tobacco: Never Used  . Alcohol Use: No    Review of Systems  Unable to perform ROS: Dementia    Allergies  Review of patient's allergies indicates no known allergies.  Home Medications   No current outpatient prescriptions on file. BP 135/77  Pulse 73  Temp(Src) 97.4 F (36.3 C) (Oral)  Resp 18  Ht 5\' 5"  (1.651 m)  Wt 122 lb 8 oz (55.566 kg)   BMI 20.39 kg/m2  SpO2 95% Physical Exam  Constitutional: He appears well-developed and well-nourished. No distress.  Difficult to understand speech  HENT:  Head: Normocephalic and atraumatic.  Mouth/Throat: Oropharynx is clear and moist. No oropharyngeal exudate.  Eyes: Conjunctivae and EOM are normal. Pupils are equal, round, and reactive to light.  Neck: Normal range of motion. Neck supple.  Cardiovascular: Normal rate, regular rhythm and normal heart sounds.   No murmur heard. Pulmonary/Chest: Effort normal and breath sounds normal. No respiratory distress.  Abdominal: Soft. There is no tenderness. There is no rebound and no guarding.  Musculoskeletal: Normal range of motion. He exhibits no edema and no tenderness.  Neurological: He is alert. He exhibits normal muscle tone. Coordination normal.   Oriented to self only. Right arm and leg weakness which is chronic by report.  Skin: Skin is warm.    ED Course   Procedures (including critical care time)  Labs Reviewed  CBC WITH DIFFERENTIAL - Abnormal; Notable for the following:    WBC 11.6 (*)    Neutro Abs 8.2 (*)    All other components within normal limits  URINALYSIS, ROUTINE W REFLEX MICROSCOPIC - Abnormal; Notable for the following:    APPearance CLOUDY (*)    Nitrite POSITIVE (*)    Leukocytes, UA SMALL (*)  All other components within normal limits  COMPREHENSIVE METABOLIC PANEL - Abnormal; Notable for the following:    Potassium 3.3 (*)    Glucose, Bld 108 (*)    Alkaline Phosphatase 130 (*)    All other components within normal limits  URINE MICROSCOPIC-ADD ON - Abnormal; Notable for the following:    Bacteria, UA MANY (*)    All other components within normal limits  POCT I-STAT, CHEM 8 - Abnormal; Notable for the following:    Glucose, Bld 112 (*)    All other components within normal limits  CULTURE, BLOOD (ROUTINE X 2)  CULTURE, BLOOD (ROUTINE X 2)  URINE CULTURE  TROPONIN I  PROTIME-INR   COMPREHENSIVE METABOLIC PANEL  CBC WITH DIFFERENTIAL  CBC  CG4 I-STAT (LACTIC ACID)   Ct Head Wo Contrast  11/24/2012   *RADIOLOGY REPORT*  Clinical Data: Intermittent slurred speech, history of prior stroke  CT HEAD WITHOUT CONTRAST  Technique:  Contiguous axial images were obtained from the base of the skull through the vertex without contrast.  Comparison: Prior head CT 05/26/2012; prior brain MRI 05/27/2012  Findings: No acute intracranial hemorrhage, acute infarction, mass lesion, mass effect, midline shift or hydrocephalus.  Gray-white differentiation is preserved throughout.  Stable appearance of prior left PCA territory infarct and lacunar infarcts in the left thalamus.  Stable cortical and central atrophy with ex vacuo dilatation of the lateral ventricles.  Mild chronic ischemic white matter changes.  Intracranial atherosclerosis in the bilateral cavernous carotid arteries.  Mucoperiosteal thickening and small air fluid level in the left maxillary sinus has slightly progressed compared to prior.  Normal aeration of the mastoid air cells.  IMPRESSION:  1.  No acute intracranial abnormality. 2.  Stable atrophy, remote left PCA territory and left thalamic infarcts 3.  Slight interval progression of chronic left maxillary sinus disease.   Original Report Authenticated By: Malachy Moan, M.D.   Dg Chest Port 1 View  (if Code Sepsis Called)  11/24/2012   *RADIOLOGY REPORT*  Clinical Data: Urinary tract infection, stroke symptoms  PORTABLE CHEST - 1 VIEW  Comparison: Prior chest x-ray 08/13/2012  Findings: The lungs are well-aerated and free from pulmonary edema, focal airspace consolidation or pulmonary nodule.  Cardiac and mediastinal contours are within normal limits.  No pneumothorax, or pleural effusion. No acute osseous findings.  IMPRESSION:  No acute cardiopulmonary disease.   Original Report Authenticated By: Malachy Moan, M.D.   1. Urinary tract infection   2. Acute encephalopathy    3. Dementia     MDM  Speech problems with foul-smelling urine history of previous stroke. Possibly worsening right-sided weakness and speech deficits. Vital stable. No distress.  Urinalysis appears infected. Rocephin is started. Patient likely has encephalopathy secondary to UTI. However cannot rule out rule out new stroke.  We'll admit for IV antibiotics, MRI and further workup.   Date: 11/24/2012  Rate: 84  Rhythm: normal sinus rhythm  QRS Axis: normal  Intervals: normal  ST/T Wave abnormalities: ST depressions anteriorly  Conduction Disutrbances:none  Narrative Interpretation:   Old EKG Reviewed: unchanged    Glynn Octave, MD 11/24/12 0710

## 2012-11-24 NOTE — H&P (Signed)
[facility] History and Physical  Dannel Rafter VWU:[id number] DOB: 01-06-1932 DOA: 11/24/2012  Referring physician: ER physician. PCP: [name], MD  Specialists: Dr. Pearlean Brownie. Neurologist.  Chief Complaint: Confusion and slurred speech.  History provided by ER physician previous records and patient's son.  HPI: Raymond Rangel is a 79 y.o. male with history of dementia, previous stroke with right-sided weakness, hyperlipidemia was brought to the ER after patient's family found that patient has been increasingly confused for last 2 days with slurred speech and increasing weakness of the right side. In the ER CT head was negative for anything acute and patient was found to have a UTI and has been admitted for further management. Patient has advanced dementia and his only oriented to his name and does not provide any history. Patient otherwise did not complain of any chest pain or shortness of breath did not have any diarrhea fever chills.  Review of Systems: As presented in the history of presenting illness, rest negative.  Past Medical History  Diagnosis Date  . Mini stroke   . Irregular heart beat   . Blood clot in vein     left leg dvt  . Dementia   . DVT (deep venous thrombosis)   . Hypercholesteremia   . Arthritis   . Diabetes mellitus   . Infectious colitis 08/07/2012  . Dysrhythmia    Past Surgical History  Procedure Laterality Date  . Left arm    . Rotator cuff repair  2000(L) 1999 (R)   Social History:  reports that he quit smoking about 54 years ago. His smoking use included Cigarettes. He has a 20 pack-year smoking history. He has never used smokeless tobacco. He reports that he does not drink alcohol or use illicit drugs. Home. where does patient live-- Not sure. Can patient participate in ADLs?  No Known Allergies  Family History  Problem Relation Age of Onset  . Heart disease Mother   . Diabetes Mother   . Colon cancer Neg Hx   . Prostate cancer Neg Hx   .  Heart disease Father       Prior to Admission medications   Medication Sig Start Date End Date Taking? Authorizing Provider  aspirin 81 MG chewable tablet Chew 1 tablet (81 mg total) by mouth daily. 06/25/12  Yes Daniel J Angiulli, PA-C  atorvastatin (LIPITOR) 40 MG tablet Take 40 mg by mouth daily.   Yes Historical Provider, MD  baclofen (LIORESAL) 10 MG tablet Take 10 mg by mouth 3 (three) times daily.   Yes Historical Provider, MD  clopidogrel (PLAVIX) 75 MG tablet Take 75 mg by mouth daily.   Yes Historical Provider, MD  donepezil (ARICEPT) 5 MG tablet Take 1 tablet (5 mg total) by mouth at bedtime. 10/22/12  Yes Wanda Plump, MD  QUEtiapine (SEROQUEL) 50 MG tablet Take 1 tablet (50 mg total) by mouth 2 (two) times daily. 10/25/12  Yes Wanda Plump, MD   Physical Exam: Filed Vitals:   11/24/12 0100 11/24/12 0506  BP: 139/79 135/77  Pulse: 46 73  Temp:  97.4 F (36.3 C)  TempSrc:  Oral  Resp: 18 18  Height:  5\' 5"  (1.651 m)  Weight:  55.566 kg (122 lb 8 oz)  SpO2: 97% 95%     General:  Well-developed and nourished.  Eyes: Anicteric no pallor.  ENT: No discharge from the ears eyes nose mouth.  Neck: No mass felt.  Cardiovascular: S1-S2 heard.  Respiratory: No rhonchi or crepitations.  Abdomen: Soft nontender bowel sounds present.  Skin: No rash.  Musculoskeletal: No edema.  Psychiatric: Patient is oriented to his name only.  Neurologic: Presently moves all extremities and follows commands.  Labs on Admission:  Basic Metabolic Panel:  Recent Labs Lab 11/24/12 0126 11/24/12 0138  NA 137 140  K 3.3* 3.5  CL 100 103  CO2 23  --   GLUCOSE 108* 112*  BUN 8 7  CREATININE 0.64 0.70  CALCIUM 9.5  --    Liver Function Tests:  Recent Labs Lab 11/24/12 0126  AST 16  ALT 8  ALKPHOS 130*  BILITOT 0.3  PROT 8.1  ALBUMIN 3.7   No results found for this basename: LIPASE, AMYLASE,  in the last 168 hours No results found for this basename: AMMONIA,  in the last  168 hours CBC:  Recent Labs Lab 11/24/12 0126 11/24/12 0138  WBC 11.6*  --   NEUTROABS 8.2*  --   HGB 14.0 14.6  HCT 40.1 43.0  MCV 84.1  --   PLT 262  --    Cardiac Enzymes:  Recent Labs Lab 11/24/12 0127  TROPONINI <0.30    BNP (last 3 results) No results found for this basename: PROBNP,  in the last 8760 hours CBG: No results found for this basename: GLUCAP,  in the last 168 hours  Radiological Exams on Admission: Ct Head Wo Contrast  11/24/2012   *RADIOLOGY REPORT*  Clinical Data: Intermittent slurred speech, history of prior stroke  CT HEAD WITHOUT CONTRAST  Technique:  Contiguous axial images were obtained from the base of the skull through the vertex without contrast.  Comparison: Prior head CT 05/26/2012; prior brain MRI 05/27/2012  Findings: No acute intracranial hemorrhage, acute infarction, mass lesion, mass effect, midline shift or hydrocephalus.  Gray-white differentiation is preserved throughout.  Stable appearance of prior left PCA territory infarct and lacunar infarcts in the left thalamus.  Stable cortical and central atrophy with ex vacuo dilatation of the lateral ventricles.  Mild chronic ischemic white matter changes.  Intracranial atherosclerosis in the bilateral cavernous carotid arteries.  Mucoperiosteal thickening and small air fluid level in the left maxillary sinus has slightly progressed compared to prior.  Normal aeration of the mastoid air cells.  IMPRESSION:  1.  No acute intracranial abnormality. 2.  Stable atrophy, remote left PCA territory and left thalamic infarcts 3.  Slight interval progression of chronic left maxillary sinus disease.   Original Report Authenticated By: Malachy Moan, M.D.   Dg Chest Port 1 View  (if Code Sepsis Called)  11/24/2012   *RADIOLOGY REPORT*  Clinical Data: Urinary tract infection, stroke symptoms  PORTABLE CHEST - 1 VIEW  Comparison: Prior chest x-ray 08/13/2012  Findings: The lungs are well-aerated and free from  pulmonary edema, focal airspace consolidation or pulmonary nodule.  Cardiac and mediastinal contours are within normal limits.  No pneumothorax, or pleural effusion. No acute osseous findings.  IMPRESSION:  No acute cardiopulmonary disease.   Original Report Authenticated By: Malachy Moan, M.D.     Assessment/Plan Principal Problem:   Acute encephalopathy Active Problems:   Vascular dementia with behavior disturbance   Urinary tract infection   1. Acute encephalopathy - possibly from UTI. Check MRI brain to rule out stroke. Continue gentle hydration and antibiotics. Follow urine cultures. 2. UTI - see #1. 3. Dementia - continue present medications. 4. History of stroke - continue antiplatelet agents. 5. Hyperlipidemia - continue statins.    Code Status: Full code. As discussed with  patient's son.  Family Communication: Patient's son.  Disposition Plan: Admit to inpatient.    Nuriya Stuck N. [facility] Pager [phone].  If 7PM-7AM, please contact night-coverage [email or web address] Password [facility] 11/24/2012, 6:06 AM

## 2012-11-24 NOTE — Progress Notes (Signed)
Patient has been resting quietly. Has been incontinent of urine.

## 2012-11-24 NOTE — Progress Notes (Signed)
Patient seen, in no distress pleasantly confused, was admitted few hours ago. Has baseline dementia in mild delirium, being treated for UTI. MRI pending. No new focal deficits.

## 2012-11-24 NOTE — ED Notes (Signed)
Old and New EKG given to Dr Rancour 

## 2012-11-24 NOTE — Progress Notes (Signed)
INITIAL NUTRITION ASSESSMENT  DOCUMENTATION CODES Per approved criteria  -Severe malnutrition in the context of chronic illness   INTERVENTION:  Ensure Complete twice daily (350 kcals, 13 gm protein per 8 fl oz bottle) RD to follow for nutrition care plan  NUTRITION DIAGNOSIS: Increased nutrient needs related to malnutrition as evidenced by estimated nutrition needs  Goal: Pt to meet >/= 90% of their estimated nutrition needs   Monitor:  PO & supplemental intake, weight, labs, I/O's  Reason for Assessment: Malnutrition Screening Tool Report, Low Braden  79 y.o. male  Admitting Dx: Acute encephalopathy  ASSESSMENT: Patient with PMH of dementia, previous stroke with right-sided weakness, hyperlipidemia; brought to ER after patient's family found patient to be increasingly confused with slurred speech and increasing weakness of the right side; found to have UTI and has been admitted for further management.   RD unable to obtain nutrition hx as patient demented; sitting at nursing station; PO intake 50% per flowsheet records; patient with visible muscle loss and subcutaneous fat loss to upper body; also patient has had a 9% weight loss since previous hospitalization (May 2014) ---> severe for time frame; would benefit from nutrition supplements ---> RD to order.  Pt meets criteria for severe malnutrition in the context of chronic illness as evidenced by 9% weight loss x 3 months, severe subcutaneous fat loss (upper arm) and severe muscle loss (temples, clavicles, acromion bone).  Height: Ht Readings from Last 1 Encounters:  11/24/12 5\' 5"  (1.651 m)    Weight: Wt Readings from Last 1 Encounters:  11/24/12 122 lb 8 oz (55.566 kg)    Ideal Body Weight: 136 lb  % Ideal Body Weight: 89%  Wt Readings from Last 10 Encounters:  11/24/12 122 lb 8 oz (55.566 kg)  08/16/12 125 lb (56.7 kg)  08/09/12 135 lb 9.3 oz (61.5 kg)  07/06/12 136 lb (61.689 kg)  06/20/12 136 lb 11 oz (62  kg)  05/27/12 136 lb 3.2 oz (61.78 kg)  07/29/11 136 lb (61.689 kg)  06/29/11 137 lb (62.143 kg)  06/21/11 126 lb 9.6 oz (57.425 kg)  06/16/11 127 lb (57.607 kg)    Usual Body Weight: 135 lb  % Usual Body Weight: 90%  BMI:  Body mass index is 20.39 kg/(m^2).  Estimated Nutritional Needs: Kcal: 1600-1800 Protein: 80-90 gm Fluid: 1.6-1.8 L  Skin: Intact  Diet Order: Cardiac  EDUCATION NEEDS: -No education needs identified at this time   Intake/Output Summary (Last 24 hours) at 11/24/12 1345 Last data filed at 11/24/12 0900  Gross per 24 hour  Intake 146.25 ml  Output      0 ml  Net 146.25 ml    Labs:   Recent Labs Lab 11/24/12 0126 11/24/12 0138  NA 137 140  K 3.3* 3.5  CL 100 103  CO2 23  --   BUN 8 7  CREATININE 0.64 0.70  CALCIUM 9.5  --   GLUCOSE 108* 112*    Scheduled Meds: . aspirin  81 mg Oral Daily  . atorvastatin  40 mg Oral Daily  . baclofen  10 mg Oral TID  . [START ON 11/25/2012] cefTRIAXone (ROCEPHIN)  IV  1 g Intravenous Q24H  . clopidogrel  75 mg Oral Daily  . donepezil  5 mg Oral QHS  . enoxaparin (LOVENOX) injection  40 mg Subcutaneous Q24H  . QUEtiapine  50 mg Oral BID  . sodium chloride  3 mL Intravenous Q12H    Continuous Infusions: . sodium chloride Stopped (11/24/12  1237)    Past Medical History  Diagnosis Date  . Mini stroke   . Irregular heart beat   . Blood clot in vein     left leg dvt  . Dementia   . DVT (deep venous thrombosis)   . Hypercholesteremia   . Arthritis   . Diabetes mellitus   . Infectious colitis 08/07/2012  . Dysrhythmia     Past Surgical History  Procedure Laterality Date  . Left arm    . Rotator cuff repair  2000(L) 1999 (R)    Maureen Chatters, RD, LDN Pager #: [phone] After-Hours Pager #: [phone]

## 2012-11-24 NOTE — Progress Notes (Signed)
Pt moved to nurses station for safety. Pt high fall risk and continues to try to get not of bed w/o asst.

## 2012-11-25 DIAGNOSIS — N390 Urinary tract infection, site not specified: Principal | ICD-10-CM

## 2012-11-25 LAB — BASIC METABOLIC PANEL
BUN: 9 mg/dL (ref 6–23)
CO2: 24 mEq/L (ref 19–32)
Chloride: 103 mEq/L (ref 96–112)
GFR calc Af Amer: >90 mL/min (ref >90)
Glucose, Bld: 104 mg/dL — ABNORMAL HIGH (ref 70–99)
Potassium: 3.2 mEq/L — ABNORMAL LOW (ref 3.5–5.1)

## 2012-11-25 LAB — CBC
HCT: 37.4 % — ABNORMAL LOW (ref 39.0–52.0)
Hemoglobin: 13.3 g/dL (ref 13.0–17.0)
MCHC: 35.6 g/dL (ref 30.0–36.0)

## 2012-11-25 MED ORDER — POTASSIUM CHLORIDE CRYS ER 20 MEQ PO TBCR
40.0000 meq | EXTENDED_RELEASE_TABLET | Freq: Once | ORAL | Status: AC
  Administered 2012-11-25: 40 meq via ORAL
  Filled 2012-11-25: qty 2

## 2012-11-25 NOTE — Progress Notes (Signed)
Pt alseep in bed this AM. Woke  Pt up to eat, no change in mental stat from 8-17. Pt linens change, condom cath reapplied  D/t Pt pulled off. Will continue to monitor

## 2012-11-25 NOTE — Progress Notes (Addendum)
[facility]                                                                                Patient Demographics  Raymond Rangel, is a 79 y.o. male, DOB - 1931/08/19, UJW:[id number], FAO:[id number]  Admit date - 11/24/2012  Admitting Physician Eduard Clos, MD  Outpatient Primary MD for the patient is [name], MD  LOS - 1   Chief Complaint  Patient presents with  . Stroke Symptoms  . Urinary Tract Infection        Assessment & Plan    1. Acute Met. Encephalopathy in a patient with advanced dementia secondary to UTI - MRI does not show any acute change, he does have old CVA, no new focal deficits, currently close to baseline with empiric antibiotics for UTI. Continue to provide supportive care, discussed with family members they would prefer nursing home placement, PT and social work input has been requested. Continue Seroquel twice a day.    2. History of CAD, dyslipidemia - . All stable, he is on aspirin, Plavix and statin which will be continued. Symptom-free.   Addendum - ? Brief artifact VS ST on monitor (6sec) asymptomatic, B.Blocker added, repeat EKG.   Echo feb 2014  - Left ventricle: The cavity size was normal. Wall thickness was normal. Systolic function was normal. The estimated ejection fraction was in the range of 55% to 60%. Wall motion was normal; there were no regional wall motion abnormalities. Doppler parameters are consistent with abnormal left ventricular relaxation (grade 1 diastolic dysfunction). - Aortic valve: There was mild to moderate stenosis. Trivial regurgitation. Valve area: 1.37cm^2(VTI). Valve area: 1.48cm^2 (Vmax). - Mitral valve: Mild regurgitation directed centrally. - Atrial septum: No defect or patent foramen ovale was identified. - Pulmonary arteries: Systolic pressure was mildly increased. PA peak pressure: 35mm Hg (S).     3. Advanced vascular dementia. Stable on Aricept which will be continued. Aspirin, statin and Plavix  for secondary prevention on board.     4.UTI- responded well to empiric Rocephin which will be continued, monitor cultures.    6.Unspecified protein-calorie malnutrition- dietary following, continue protein supplementation.    Code Status: Full  Family Communication: son and daughter  Disposition Plan: SNF   Procedures MRI brain old L PCA CVA, CT head  Consults  PT   DVT Prophylaxis  Lovenox  Lab Results  Component Value Date   PLT 223 11/25/2012    Medications  Scheduled Meds: . aspirin  81 mg Oral Daily  . atorvastatin  40 mg Oral Daily  . baclofen  10 mg Oral TID  . cefTRIAXone (ROCEPHIN)  IV  1 g Intravenous Q24H  . clopidogrel  75 mg Oral Daily  . donepezil  5 mg Oral QHS  . enoxaparin (LOVENOX) injection  40 mg Subcutaneous Q24H  . feeding supplement  237 mL Oral BID BM  . potassium chloride  40 mEq Oral Once  . QUEtiapine  50 mg Oral BID  . sodium chloride  3 mL Intravenous Q12H   Continuous Infusions:  PRN Meds:.acetaminophen, acetaminophen, ondansetron (ZOFRAN) IV, ondansetron, zolpidem  Antibiotics    Anti-infectives   Start     Dose/Rate  Route Frequency Ordered Stop   11/25/12 0000  cefTRIAXone (ROCEPHIN) 1 g in dextrose 5 % 50 mL IVPB     1 g 100 mL/hr over 30 Minutes Intravenous Every 24 hours 11/24/12 0605     11/24/12 0245  cefTRIAXone (ROCEPHIN) 1 g in dextrose 5 % 50 mL IVPB     1 g 100 mL/hr over 30 Minutes Intravenous  Once 11/24/12 0242 11/24/12 0428       Time Spent in minutes   35   SINGH,PRASHANT K M.D on 11/25/2012 at 10:15 AM  Between 7am to 7pm - Pager - [phone]  After 7pm go to [email or web address] - password [email or web address]  And look for the night coverage person covering for me after hours  [facility] Office  [phone]    Subjective:   Raymond Rangel today has, No headache, No chest pain, No abdominal pain - No Nausea, No new weakness tingling or numbness, No Cough - SOB.   Objective:   Filed Vitals:    11/24/12 1119 11/24/12 1330 11/24/12 1953 11/25/12 0543  BP: 132/62 149/85 120/68 143/72  Pulse: 71 66 62 52  Temp:  98.4 F (36.9 C) 97.4 F (36.3 C) 98.5 F (36.9 C)  TempSrc:  Oral Oral Oral  Resp: 18 20 18 18   Height:      Weight:    57.153 kg (126 lb)  SpO2: 94% 93% 92% 94%    Wt Readings from Last 3 Encounters:  11/25/12 57.153 kg (126 lb)  08/16/12 56.7 kg (125 lb)  08/09/12 61.5 kg (135 lb 9.3 oz)     Intake/Output Summary (Last 24 hours) at 11/25/12 1015 Last data filed at 11/24/12 2130  Gross per 24 hour  Intake 1001.25 ml  Output    300 ml  Net 701.25 ml    Exam Awake , pleasantly confused, Oriented X 0, No new F.N deficits, Normal affect [place].AT,PERRAL Supple Neck,No JVD, No cervical lymphadenopathy appriciated.  Symmetrical Chest wall movement, Good air movement bilaterally, CTAB RRR,No Gallops,Rubs or new Murmurs, No Parasternal Heave +ve B.Sounds, Abd Soft, Non tender, No organomegaly appriciated, No rebound - guarding or rigidity. No Cyanosis, Clubbing or edema, No new Rash or bruise     Data Review   Micro Results No results found for this or any previous visit (from the past 240 hour(s)).  Radiology Reports Ct Head Wo Contrast  11/24/2012   *RADIOLOGY REPORT*  Clinical Data: Intermittent slurred speech, history of prior stroke  CT HEAD WITHOUT CONTRAST  Technique:  Contiguous axial images were obtained from the base of the skull through the vertex without contrast.  Comparison: Prior head CT 05/26/2012; prior brain MRI 05/27/2012  Findings: No acute intracranial hemorrhage, acute infarction, mass lesion, mass effect, midline shift or hydrocephalus.  Gray-white differentiation is preserved throughout.  Stable appearance of prior left PCA territory infarct and lacunar infarcts in the left thalamus.  Stable cortical and central atrophy with ex vacuo dilatation of the lateral ventricles.  Mild chronic ischemic white matter changes.  Intracranial  atherosclerosis in the bilateral cavernous carotid arteries.  Mucoperiosteal thickening and small air fluid level in the left maxillary sinus has slightly progressed compared to prior.  Normal aeration of the mastoid air cells.  IMPRESSION:  1.  No acute intracranial abnormality. 2.  Stable atrophy, remote left PCA territory and left thalamic infarcts 3.  Slight interval progression of chronic left maxillary sinus disease.   Original Report Authenticated By: Malachy Moan, M.D.   Mr Brain  Wo Contrast  11/24/2012   *RADIOLOGY REPORT*  Clinical Data: Confusion.  Slurred speech.  MRI HEAD WITHOUT CONTRAST  Technique:  Multiplanar, multiecho pulse sequences of the brain and surrounding structures were obtained according to standard protocol without intravenous contrast.  Comparison: Head CT same day.  MRI 05/27/2012.  Findings: Diffusion imaging does not show any acute or subacute infarction.  The brainstem is unremarkable.  There are old small vessel cerebellar infarctions.  The cerebral hemispheres show generalized atrophy.  There is old infarction in the left occipital cortical and subcortical region.  There are mild chronic small vessel changes of the deep white matter.  No mass lesion, hemorrhage, hydrocephalus or extra-axial collection.  There is mild mucosal inflammation of the left maxillary sinus.  The other sinuses are clear.  IMPRESSION: No acute finding.  Old left occipital cortical and subcortical infarction.  Old small vessel cerebellar infarctions.   Original Report Authenticated By: Paulina Fusi, M.D.   Dg Chest Port 1 View  (if Code Sepsis Called)  11/24/2012   *RADIOLOGY REPORT*  Clinical Data: Urinary tract infection, stroke symptoms  PORTABLE CHEST - 1 VIEW  Comparison: Prior chest x-ray 08/13/2012  Findings: The lungs are well-aerated and free from pulmonary edema, focal airspace consolidation or pulmonary nodule.  Cardiac and mediastinal contours are within normal limits.  No pneumothorax, or  pleural effusion. No acute osseous findings.  IMPRESSION:  No acute cardiopulmonary disease.   Original Report Authenticated By: Malachy Moan, M.D.    CBC  Recent Labs Lab 11/24/12 0126 11/24/12 0138 11/25/12 0520  WBC 11.6*  --  8.7  HGB 14.0 14.6 13.3  HCT 40.1 43.0 37.4*  PLT 262  --  223  MCV 84.1  --  83.3  MCH 29.4  --  29.6  MCHC 34.9  --  35.6  RDW 14.7  --  14.9  LYMPHSABS 2.4  --   --   MONOABS 0.9  --   --   EOSABS 0.2  --   --   BASOSABS 0.1  --   --     Chemistries   Recent Labs Lab 11/24/12 0126 11/24/12 0138 11/25/12 0520  NA 137 140 138  K 3.3* 3.5 3.2*  CL 100 103 103  CO2 23  --  24  GLUCOSE 108* 112* 104*  BUN 8 7 9   CREATININE 0.64 0.70 0.69  CALCIUM 9.5  --  9.0  AST 16  --   --   ALT 8  --   --   ALKPHOS 130*  --   --   BILITOT 0.3  --   --    ------------------------------------------------------------------------------------------------------------------ estimated creatinine clearance is 60.6 ml/min (by C-G formula based on Cr of 0.69). ------------------------------------------------------------------------------------------------------------------ No results found for this basename: HGBA1C,  in the last 72 hours ------------------------------------------------------------------------------------------------------------------ No results found for this basename: CHOL, HDL, LDLCALC, TRIG, CHOLHDL, LDLDIRECT,  in the last 72 hours ------------------------------------------------------------------------------------------------------------------ No results found for this basename: TSH, T4TOTAL, FREET3, T3FREE, THYROIDAB,  in the last 72 hours ------------------------------------------------------------------------------------------------------------------ No results found for this basename: VITAMINB12, FOLATE, FERRITIN, TIBC, IRON, RETICCTPCT,  in the last 72 hours  Coagulation profile  Recent Labs Lab 11/24/12 0126  INR 0.95     No results found for this basename: DDIMER,  in the last 72 hours  Cardiac Enzymes  Recent Labs Lab 11/24/12 0127  TROPONINI <0.30   ------------------------------------------------------------------------------------------------------------------ No components found with this basename: POCBNP,

## 2012-11-26 ENCOUNTER — Ambulatory Visit: Payer: Medicare Other | Admitting: Physical Therapy

## 2012-11-26 ENCOUNTER — Other Ambulatory Visit: Payer: Self-pay

## 2012-11-26 DIAGNOSIS — E43 Unspecified severe protein-calorie malnutrition: Secondary | ICD-10-CM | POA: Insufficient documentation

## 2012-11-26 LAB — BASIC METABOLIC PANEL
BUN: 7 mg/dL (ref 6–23)
GFR calc non Af Amer: 90 mL/min — ABNORMAL LOW (ref >90)
Glucose, Bld: 103 mg/dL — ABNORMAL HIGH (ref 70–99)
Potassium: 3.8 mEq/L (ref 3.5–5.1)

## 2012-11-26 LAB — URINE CULTURE

## 2012-11-26 MED ORDER — METOPROLOL TARTRATE 25 MG PO TABS
25.0000 mg | ORAL_TABLET | Freq: Two times a day (BID) | ORAL | Status: DC
  Administered 2012-11-26 – 2012-11-27 (×2): 25 mg via ORAL
  Filled 2012-11-26 (×4): qty 1

## 2012-11-26 MED ORDER — MAGNESIUM SULFATE IN D5W 10-5 MG/ML-% IV SOLN
1.0000 g | Freq: Once | INTRAVENOUS | Status: AC
  Administered 2012-11-26: 1 g via INTRAVENOUS
  Filled 2012-11-26: qty 100

## 2012-11-26 NOTE — Progress Notes (Addendum)
[facility]                                                                                Patient Demographics  Raymond Rangel, is a 79 y.o. male, DOB - 1931/10/11, WUJ:[id number], NFA:[id number]  Admit date - 11/24/2012  Admitting Physician Eduard Clos, MD  Outpatient Primary MD for the patient is [name], MD  LOS - 2   Chief Complaint  Patient presents with  . Stroke Symptoms  . Urinary Tract Infection        Assessment & Plan    1. Acute Met. Encephalopathy in a patient with advanced dementia secondary to UTI - MRI does not show any acute change, he does have old CVA, no new focal deficits, currently close to baseline with empiric antibiotics for UTI. Continue to provide supportive care, discussed with family members they would prefer nursing home placement, PT and social work input has been requested. Continue Seroquel twice a day.    2. History of CAD, dyslipidemia - . All stable, he is on aspirin, Plavix and statin which will be continued. Symptom-free.    3. Advanced vascular dementia. Stable on Aricept which will be continued. Aspirin, statin and Plavix for secondary prevention on board.     4.UTI- responded well to Rocephin, cultures noted. Will try to provide a total of 5 days of treatment.    6.Unspecified protein-calorie malnutrition - dietary following, continue protein supplementation.    7. Old CVA with right-sided hemiparesis. Continue secondary prevention through aspirin, Plavix and statin.      Code Status: Full  Family Communication: son and daughter  Disposition Plan: SNF   Procedures MRI brain old L PCA CVA, CT head  Consults  PT   DVT Prophylaxis  Lovenox  Lab Results  Component Value Date   PLT 223 11/25/2012    Medications  Scheduled Meds: . aspirin  81 mg Oral Daily  . atorvastatin  40 mg Oral Daily  . baclofen  10 mg Oral TID  . cefTRIAXone (ROCEPHIN)  IV  1 g Intravenous Q24H  . clopidogrel  75 mg Oral  Daily  . donepezil  5 mg Oral QHS  . feeding supplement  237 mL Oral BID BM  . QUEtiapine  50 mg Oral BID  . sodium chloride  3 mL Intravenous Q12H   Continuous Infusions:  PRN Meds:.acetaminophen, acetaminophen, ondansetron (ZOFRAN) IV, ondansetron, zolpidem  Antibiotics    Anti-infectives   Start     Dose/Rate Route Frequency Ordered Stop   11/25/12 0000  cefTRIAXone (ROCEPHIN) 1 g in dextrose 5 % 50 mL IVPB     1 g 100 mL/hr over 30 Minutes Intravenous Every 24 hours 11/24/12 0605     11/24/12 0245  cefTRIAXone (ROCEPHIN) 1 g in dextrose 5 % 50 mL IVPB     1 g 100 mL/hr over 30 Minutes Intravenous  Once 11/24/12 0242 11/24/12 0428       Time Spent in minutes   35   SINGH,PRASHANT K M.D on 11/26/2012 at 11:07 AM  Between 7am to 7pm - Pager - [phone]  After 7pm go to [email or web address] - password [email or web address]  And look for the  night coverage person covering for me after hours  [facility] Office  [phone]    Subjective:   Raymond Rangel today has, No headache, No chest pain, No abdominal pain - No Nausea, No new weakness tingling or numbness, No Cough - SOB.   Objective:   Filed Vitals:   11/25/12 1035 11/25/12 1300 11/25/12 2114 11/26/12 0541  BP: 109/63 114/68 114/67 129/70  Pulse: 72 71 69 65  Temp:  97.3 F (36.3 C) 97.4 F (36.3 C) 97.8 F (36.6 C)  TempSrc:  Oral Oral Oral  Resp: 18 18 18 17   Height:      Weight:    55.974 kg (123 lb 6.4 oz)  SpO2: 96% 99% 98% 96%    Wt Readings from Last 3 Encounters:  11/26/12 55.974 kg (123 lb 6.4 oz)  08/16/12 56.7 kg (125 lb)  08/09/12 61.5 kg (135 lb 9.3 oz)     Intake/Output Summary (Last 24 hours) at 11/26/12 1107 Last data filed at 11/26/12 0901  Gross per 24 hour  Intake    360 ml  Output      2 ml  Net    358 ml    Exam Awake , pleasantly confused, Oriented X 0, No new F.N deficits, Normal affect [place].AT,PERRAL Supple Neck,No JVD, No cervical lymphadenopathy appriciated.  Symmetrical  Chest wall movement, Good air movement bilaterally, CTAB RRR,No Gallops,Rubs or new Murmurs, No Parasternal Heave +ve B.Sounds, Abd Soft, Non tender, No organomegaly appriciated, No rebound - guarding or rigidity. No Cyanosis, Clubbing or edema, No new Rash or bruise     Data Review   Micro Results Recent Results (from the past 240 hour(s))  CULTURE, BLOOD (ROUTINE X 2)     Status: None   Collection Time    11/24/12  1:15 AM      Result Value Range Status   Specimen Description BLOOD LEFT HAND   Final   Special Requests BOTTLES DRAWN AEROBIC ONLY 5CC   Final   Culture  Setup Time     Final   Value: 11/24/2012 16:00     Performed at [facility]   Culture     Final   Value:        BLOOD CULTURE RECEIVED NO GROWTH TO DATE CULTURE WILL BE HELD FOR 5 DAYS BEFORE ISSUING A FINAL NEGATIVE REPORT     Performed at [facility]   Report Status PENDING   Incomplete  CULTURE, BLOOD (ROUTINE X 2)     Status: None   Collection Time    11/24/12  1:30 AM      Result Value Range Status   Specimen Description BLOOD RIGHT HAND   Final   Special Requests BOTTLES DRAWN AEROBIC AND ANAEROBIC 5CC EACH   Final   Culture  Setup Time     Final   Value: 11/24/2012 16:00     Performed at [facility]   Culture     Final   Value:        BLOOD CULTURE RECEIVED NO GROWTH TO DATE CULTURE WILL BE HELD FOR 5 DAYS BEFORE ISSUING A FINAL NEGATIVE REPORT     Performed at [facility]   Report Status PENDING   Incomplete  URINE CULTURE     Status: None   Collection Time    11/24/12  1:56 AM      Result Value Range Status   Specimen Description URINE, CLEAN CATCH   Final   Special  Requests NONE   Final   Culture  Setup Time     Final   Value: 11/24/2012 03:59     Performed at [facility] Count     Final   Value: >=100,000 COLONIES/ML     Performed at [facility]   Culture     Final   Value: ESCHERICHIA COLI     Performed at [facility]    Report Status 11/26/2012 FINAL   Final   Organism ID, Bacteria ESCHERICHIA COLI   Final    Radiology Reports Ct Head Wo Contrast  11/24/2012   *RADIOLOGY REPORT*  Clinical Data: Intermittent slurred speech, history of prior stroke  CT HEAD WITHOUT CONTRAST  Technique:  Contiguous axial images were obtained from the base of the skull through the vertex without contrast.  Comparison: Prior head CT 05/26/2012; prior brain MRI 05/27/2012  Findings: No acute intracranial hemorrhage, acute infarction, mass lesion, mass effect, midline shift or hydrocephalus.  Gray-white differentiation is preserved throughout.  Stable appearance of prior left PCA territory infarct and lacunar infarcts in the left thalamus.  Stable cortical and central atrophy with ex vacuo dilatation of the lateral ventricles.  Mild chronic ischemic white matter changes.  Intracranial atherosclerosis in the bilateral cavernous carotid arteries.  Mucoperiosteal thickening and small air fluid level in the left maxillary sinus has slightly progressed compared to prior.  Normal aeration of the mastoid air cells.  IMPRESSION:  1.  No acute intracranial abnormality. 2.  Stable atrophy, remote left PCA territory and left thalamic infarcts 3.  Slight interval progression of chronic left maxillary sinus disease.   Original Report Authenticated By: Malachy Moan, M.D.   Mr Brain Wo Contrast  11/24/2012   *RADIOLOGY REPORT*  Clinical Data: Confusion.  Slurred speech.  MRI HEAD WITHOUT CONTRAST  Technique:  Multiplanar, multiecho pulse sequences of the brain and surrounding structures were obtained according to standard protocol without intravenous contrast.  Comparison: Head CT same day.  MRI 05/27/2012.  Findings: Diffusion imaging does not show any acute or subacute infarction.  The brainstem is unremarkable.  There are old small vessel cerebellar infarctions.  The cerebral hemispheres show generalized atrophy.  There is old infarction in the left  occipital cortical and subcortical region.  There are mild chronic small vessel changes of the deep white matter.  No mass lesion, hemorrhage, hydrocephalus or extra-axial collection.  There is mild mucosal inflammation of the left maxillary sinus.  The other sinuses are clear.  IMPRESSION: No acute finding.  Old left occipital cortical and subcortical infarction.  Old small vessel cerebellar infarctions.   Original Report Authenticated By: Paulina Fusi, M.D.   Dg Chest Port 1 View  (if Code Sepsis Called)  11/24/2012   *RADIOLOGY REPORT*  Clinical Data: Urinary tract infection, stroke symptoms  PORTABLE CHEST - 1 VIEW  Comparison: Prior chest x-ray 08/13/2012  Findings: The lungs are well-aerated and free from pulmonary edema, focal airspace consolidation or pulmonary nodule.  Cardiac and mediastinal contours are within normal limits.  No pneumothorax, or pleural effusion. No acute osseous findings.  IMPRESSION:  No acute cardiopulmonary disease.   Original Report Authenticated By: Malachy Moan, M.D.    CBC  Recent Labs Lab 11/24/12 0126 11/24/12 0138 11/25/12 0520  WBC 11.6*  --  8.7  HGB 14.0 14.6 13.3  HCT 40.1 43.0 37.4*  PLT 262  --  223  MCV 84.1  --  83.3  MCH 29.4  --  29.6  MCHC 34.9  --  35.6  RDW 14.7  --  14.9  LYMPHSABS 2.4  --   --   MONOABS 0.9  --   --   EOSABS 0.2  --   --   BASOSABS 0.1  --   --     Chemistries   Recent Labs Lab 11/24/12 0126 11/24/12 0138 11/25/12 0520 11/26/12 0847  NA 137 140 138 140  K 3.3* 3.5 3.2* 3.8  CL 100 103 103 105  CO2 23  --  24 23  GLUCOSE 108* 112* 104* 103*  BUN 8 7 9 7   CREATININE 0.64 0.70 0.69 0.66  CALCIUM 9.5  --  9.0 9.2  AST 16  --   --   --   ALT 8  --   --   --   ALKPHOS 130*  --   --   --   BILITOT 0.3  --   --   --    ------------------------------------------------------------------------------------------------------------------ estimated creatinine clearance is 59.3 ml/min (by C-G formula based on Cr  of 0.66). ------------------------------------------------------------------------------------------------------------------ No results found for this basename: HGBA1C,  in the last 72 hours ------------------------------------------------------------------------------------------------------------------ No results found for this basename: CHOL, HDL, LDLCALC, TRIG, CHOLHDL, LDLDIRECT,  in the last 72 hours ------------------------------------------------------------------------------------------------------------------ No results found for this basename: TSH, T4TOTAL, FREET3, T3FREE, THYROIDAB,  in the last 72 hours ------------------------------------------------------------------------------------------------------------------ No results found for this basename: VITAMINB12, FOLATE, FERRITIN, TIBC, IRON, RETICCTPCT,  in the last 72 hours  Coagulation profile  Recent Labs Lab 11/24/12 0126  INR 0.95    No results found for this basename: DDIMER,  in the last 72 hours  Cardiac Enzymes  Recent Labs Lab 11/24/12 0127  TROPONINI <0.30   ------------------------------------------------------------------------------------------------------------------ No components found with this basename: POCBNP,

## 2012-11-26 NOTE — Progress Notes (Signed)
EKG result NSR with RBBB called to Dr. Thedore Mins.  No new orders.  Will continue to monitor.  Amanda Pea, Charity fundraiser.

## 2012-11-26 NOTE — Progress Notes (Signed)
Utilization Review Completed.   Dorris Pierre, RN, BSN Nurse Case Manager  [phone]  

## 2012-11-26 NOTE — Progress Notes (Signed)
Patient rested quietly through the night.  

## 2012-11-26 NOTE — Progress Notes (Signed)
Pt had 150 ST.  Asymptomatic.  VVS.  125/66, p73  Will continue to monitor.  Amanda Pea, Charity fundraiser.

## 2012-11-26 NOTE — Evaluation (Signed)
Physical Therapy Evaluation Patient Details Name: Raymond Rangel MRN: [id number] DOB: 1931-12-15 Today's Date: 11/26/2012 Time: 4098-1191 PT Time Calculation (min): 18 min  PT Assessment / Plan / Recommendation History of Present Illness  79 y.o. male with history of dementia, previous stroke with right-sided weakness, hyperlipidemia was brought to the ER after patient's family found that patient has been increasingly confused for last 2 days with slurred speech and increasing weakness of the right side. In the ER CT head was negative for anything acute and patient was found to have a UTI and has been admitted for further management. Patient has advanced dementia and his only oriented to his name and does not provide any history. Patient otherwise did not complain of any chest pain or shortness of breath did not have any diarrhea fever chills.  Clinical Impression  Pt with advance dementia and gross mobility deficits. Pt unsafe to return home without 24/7 assist. Pt to strongly benefit from SNF placement upon d/c to address both cognitive and mobility deficits.    PT Assessment  Patient needs continued PT services    Follow Up Recommendations  SNF    Does the patient have the potential to tolerate intense rehabilitation      Barriers to Discharge Decreased caregiver support      Equipment Recommendations  None recommended by PT    Recommendations for Other Services     Frequency Min 3X/week    Precautions / Restrictions Precautions Precautions: Fall Precaution Comments: dementia Restrictions Weight Bearing Restrictions: No   Pertinent Vitals/Pain Pt denies pain      Mobility  Bed Mobility Bed Mobility: Supine to Sit Supine to Sit: 3: Mod assist;With rails;HOB elevated Details for Bed Mobility Assistance: assist to bring hips to EOB Transfers Transfers: Sit to Stand;Stand to Sit Sit to Stand: 3: Mod assist;With upper extremity assist;From bed Stand to Sit: 4: Min  assist;With upper extremity assist;To chair/3-in-1 Details for Transfer Assistance: max directional verbal and tactile cues for hand placement. tactile cues to achieve bilat hip extension and achieve full upright position Ambulation/Gait Ambulation/Gait Assistance: 3: Mod assist (+1 for chair follow) Ambulation Distance (Feet): 30 Feet Assistive device: Rolling walker Ambulation/Gait Assistance Details: assist to maintain grip with R hand on walker, assist for  walker management. short shufflec step with decreased R foot clearance.  Gait Pattern: Step-to pattern;Decreased step length - right;Decreased stance time - right;Decreased hip/knee flexion - right;Decreased dorsiflexion - right;Decreased weight shift to right;Scissoring;Trunk flexed;Narrow base of support Gait velocity: slow General Gait Details: quickly fatigued Stairs: No    Exercises     PT Diagnosis: Difficulty walking;Generalized weakness  PT Problem List: Decreased strength;Decreased activity tolerance;Decreased balance;Decreased mobility PT Treatment Interventions: DME instruction;Gait training;Functional mobility training;Therapeutic activities;Therapeutic exercise     PT Goals(Current goals can be found in the care plan section) Acute Rehab PT Goals Patient Stated Goal: did not state PT Goal Formulation: Patient unable to participate in goal setting Time For Goal Achievement: 12/10/12 Potential to Achieve Goals: Good  Visit Information  Last PT Received On: 11/26/12 Assistance Needed: +2 History of Present Illness: 79 y.o. male with history of dementia, previous stroke with right-sided weakness, hyperlipidemia was brought to the ER after patient's family found that patient has been increasingly confused for last 2 days with slurred speech and increasing weakness of the right side. In the ER CT head was negative for anything acute and patient was found to have a UTI and has been admitted for further management. Patient  has  advanced dementia and his only oriented to his name and does not provide any history. Patient otherwise did not complain of any chest pain or shortness of breath did not have any diarrhea fever chills.       Prior Functioning  Home Living Family/patient expects to be discharged to:: Skilled nursing facility Additional Comments: pt unable to provide history Prior Function Level of Independence: Needs assistance Gait / Transfers Assistance Needed: unsure ADL's / Homemaking Assistance Needed: unsure Communication Communication: No difficulties (however pt poor historian) Dominant Hand: Right    Cognition  Cognition Arousal/Alertness: Awake/alert Behavior During Therapy: WFL for tasks assessed/performed Overall Cognitive Status: History of cognitive impairments - at baseline (pt with advanced dementia) Memory: Decreased short-term memory    Extremity/Trunk Assessment Upper Extremity Assessment Upper Extremity Assessment: RUE deficits/detail RUE Deficits / Details: shld flex 80 actively, holds in flex Lower Extremity Assessment Lower Extremity Assessment: RLE deficits/detail RLE Deficits / Details: grossly 3/5 Cervical / Trunk Assessment Cervical / Trunk Assessment: Kyphotic (forward head)   Balance Balance Balance Assessed: Yes Static Sitting Balance Static Sitting - Balance Support: No upper extremity supported Static Sitting - Level of Assistance: 5: Stand by assistance Static Sitting - Comment/# of Minutes: 3 min Static Standing Balance Static Standing - Balance Support: Bilateral upper extremity supported Static Standing - Level of Assistance: 1: +2 Total assist;Patient percentage (comment) (60 percent) Static Standing - Comment/# of Minutes: 3 min for hygiene due to urinary incontinent  End of Session PT - End of Session Equipment Utilized During Treatment: Gait belt Activity Tolerance: Patient limited by fatigue Patient left: in chair;with call bell/phone within  reach;with nursing/sitter in room Nurse Communication: Mobility status  GP     Marcene Brawn 11/26/2012, 10:44 AM  Lewis Shock, PT, DPT Pager #: [phone] Office #: [phone]

## 2012-11-27 LAB — BASIC METABOLIC PANEL
BUN: 8 mg/dL (ref 6–23)
Chloride: 101 mEq/L (ref 96–112)
GFR calc non Af Amer: 85 mL/min — ABNORMAL LOW (ref >90)
Glucose, Bld: 119 mg/dL — ABNORMAL HIGH (ref 70–99)
Potassium: 3.9 mEq/L (ref 3.5–5.1)

## 2012-11-27 MED ORDER — CEFTRIAXONE SODIUM 1 G IJ SOLR: 1.0000 g | INTRAMUSCULAR | Status: DC

## 2012-11-27 MED ORDER — METOPROLOL TARTRATE 25 MG PO TABS: 25.0000 mg | ORAL_TABLET | Freq: Two times a day (BID) | ORAL | Status: DC

## 2012-11-27 MED ORDER — ENSURE COMPLETE PO LIQD: 237.0000 mL | Freq: Two times a day (BID) | ORAL | Status: DC

## 2012-11-27 NOTE — Progress Notes (Signed)
NUTRITION FOLLOW-UP  DOCUMENTATION CODES Per approved criteria  -Severe malnutrition in the context of chronic illness   INTERVENTION: Continue Ensure Complete twice daily (350 kcals, 13 gm protein per 8 fl oz bottle) RD to follow for nutrition care plan  NUTRITION DIAGNOSIS: Increased nutrient needs related to malnutrition as evidenced by estimated nutrition needs. Ongoing.  Goal: Pt to meet >/= 90% of their estimated nutrition needs - improved.  Monitor:  PO & supplemental intake, weight, labs, I/O's  ASSESSMENT: Patient with PMH of dementia, previous stroke with right-sided weakness, hyperlipidemia; brought to ER after patient's family found patient to be increasingly confused with slurred speech and increasing weakness of the right side; found to have UTI and has been admitted for further management.   Plan to d/c to SNF when bed is available. Continues on Heart Healthy diet and is mostly eating >50% of meals (40-100%.) Continues with orders for Ensure Complete PO BID.  Pt meets criteria for severe malnutrition in the context of chronic illness as evidenced by 9% weight loss x 3 months, severe subcutaneous fat loss (upper arm) and severe muscle loss (temples, clavicles, acromion bone).  Height: Ht Readings from Last 1 Encounters:  11/24/12 5\' 5"  (1.651 m)    Weight: Wt Readings from Last 1 Encounters:  11/26/12 123 lb 6.4 oz (55.974 kg)  Admit wt 122 lb - stable  BMI:  Body mass index is 20.53 kg/(m^2). WNL  Estimated Nutritional Needs: Kcal: 1600-1800 Protein: 80-90 gm Fluid: 1.6-1.8 L  Skin: Intact  Diet Order: Cardiac  EDUCATION NEEDS: -No education needs identified at this time   Intake/Output Summary (Last 24 hours) at 11/27/12 1240 Last data filed at 11/27/12 0756  Gross per 24 hour  Intake    800 ml  Output      0 ml  Net    800 ml    Labs:   Recent Labs Lab 11/25/12 0520 11/26/12 0847 11/27/12 0545  NA 138 140 138  K 3.2* 3.8 3.9  CL  103 105 101  CO2 24 23 23   BUN 9 7 8   CREATININE 0.69 0.66 0.76  CALCIUM 9.0 9.2 9.4  MG  --   --  2.3  GLUCOSE 104* 103* 119*    Scheduled Meds: . aspirin  81 mg Oral Daily  . atorvastatin  40 mg Oral Daily  . baclofen  10 mg Oral TID  . cefTRIAXone (ROCEPHIN)  IV  1 g Intravenous Q24H  . clopidogrel  75 mg Oral Daily  . donepezil  5 mg Oral QHS  . feeding supplement  237 mL Oral BID BM  . metoprolol tartrate  25 mg Oral BID  . QUEtiapine  50 mg Oral BID  . sodium chloride  3 mL Intravenous Q12H    Continuous Infusions: none    Jarold Motto MS, RD, LDN Pager: [phone] After-hours pager: [phone]

## 2012-11-27 NOTE — Discharge Summary (Signed)
[facility]                                                                                   Raymond Rangel, is a 79 y.o. male  DOB Dec 05, 1931  MRN [id number].  Admission date:  11/24/2012  Discharge Date:  11/27/2012  Primary MD  [name], MD  Admitting Physician  Eduard Clos, MD  Admission Diagnosis  Urinary tract infection [599.0] Acute encephalopathy [348.30]  Discharge Diagnosis     Principal Problem:   Acute encephalopathy Active Problems:   CAD (coronary artery disease)   Hyperlipidemia   Dementia   Diabetes mellitus, type 2   Vascular dementia with behavior disturbance   Urinary tract infection   Unspecified protein-calorie malnutrition   Protein-calorie malnutrition, severe      Past Medical History  Diagnosis Date  . Mini stroke   . Irregular heart beat   . Blood clot in vein     left leg dvt  . Dementia   . DVT (deep venous thrombosis)   . Hypercholesteremia   . Arthritis   . Diabetes mellitus   . Infectious colitis 08/07/2012  . Dysrhythmia     Past Surgical History  Procedure Laterality Date  . Left arm    . Rotator cuff repair  2000(L) 1999 (R)     Recommendations for primary care physician for things to follow:      Discharge Diagnoses:   Principal Problem:   Acute encephalopathy Active Problems:   CAD (coronary artery disease)   Hyperlipidemia   Dementia   Diabetes mellitus, type 2   Vascular dementia with behavior disturbance   Urinary tract infection   Unspecified protein-calorie malnutrition   Protein-calorie malnutrition, severe    Discharge Condition: Stable   Diet recommendation: See Discharge Instructions below   Consults     History of present illness and  Hospital Course:     Kindly see H&P for history of present illness and admission details, please review complete Labs, Consult reports and Test reports for all details in brief Raymond Rangel, is a 79 y.o. male, patient with history of chronic  vascular dementia, dyslipidemia, type 2 diabetes mellitus diet controlled, severe protein calorie malnutrition, was admitted to the hospital with acute on chronic decline in mental status due to metabolic encephalopathy and delirium brought on by UTI.    1. Acute Met. Encephalopathy in a patient with advanced dementia secondary to UTI - MRI does not show any acute change, he does have old CVA, no new focal deficits, currently close to baseline with empiric antibiotics for UTI. Continue to provide supportive care, discussed with family members they would prefer nursing home placement, he will be discharged on few more days of IM Rocephin to SNF. Continue Seroquel twice a day.     2. History of CAD, dyslipidemia - . All stable, he is on aspirin, oh dose beta blocker, Plavix and statin which will be continued. Symptom-free. He did have a brief run of sinus tachycardia due to agitation for which beta blocker has been added, he symptom-free.   Echo Feb 2014  - Left ventricle: The cavity size was normal. Wall thickness was  normal. Systolic function was normal. The estimated ejection fraction was in the range of 55% to 60%. Wall motion was normal; there were no regional wall motion abnormalities. Doppler parameters are consistent with abnormal left ventricular relaxation (grade 1 diastolic dysfunction). - Aortic valve: There was mild to moderate stenosis. Trivial regurgitation. Valve area: 1.37cm^2(VTI). Valve area: 1.48cm^2 (Vmax). - Mitral valve: Mild regurgitation directed centrally. - Atrial septum: No defect or patent foramen ovale was identified. - Pulmonary arteries: Systolic pressure was mildly increased. PA peak pressure: 35mm Hg (S).    3. Advanced vascular dementia. Stable on Aricept which will be continued. Aspirin, statin and Plavix for secondary prevention on board.     4.UTI- responded well to Rocephin, cultures noted. Complete IM Rocephin post discharge at  [facility].   6.ordered to severe protein-calorie malnutrition - dietary followup at SNF recommended, continue protein supplementation.    7. Old CVA with right-sided hemiparesis. Continue secondary prevention through aspirin, Plavix and statin.           Today   Subjective:   Raymond Rangel today has no headache,no chest abdominal pain,no new weakness tingling or numbness, feels much better.  Objective:   Blood pressure 136/82, pulse 65, temperature 97.8 F (36.6 C), temperature source Oral, resp. rate 16, height 5\' 5"  (1.651 m), weight 55.974 kg (123 lb 6.4 oz), SpO2 100.00%.   Intake/Output Summary (Last 24 hours) at 11/27/12 1112 Last data filed at 11/27/12 0756  Gross per 24 hour  Intake    800 ml  Output      0 ml  Net    800 ml    Exam Awake Alert, Oriented *3, No new F.N deficits, Normal affect [place].AT,PERRAL Supple Neck,No JVD, No cervical lymphadenopathy appriciated.  Symmetrical Chest wall movement, Good air movement bilaterally, CTAB RRR,No Gallops,Rubs or new Murmurs, No Parasternal Heave +ve B.Sounds, Abd Soft, Non tender, No organomegaly appriciated, No rebound -guarding or rigidity. No Cyanosis, Clubbing or edema, No new Rash or bruise  Data Review   Major procedures and Radiology Reports - PLEASE review detailed and final reports for all details, in brief -      Ct Head Wo Contrast  11/24/2012   *RADIOLOGY REPORT*  Clinical Data: Intermittent slurred speech, history of prior stroke  CT HEAD WITHOUT CONTRAST  Technique:  Contiguous axial images were obtained from the base of the skull through the vertex without contrast.  Comparison: Prior head CT 05/26/2012; prior brain MRI 05/27/2012  Findings: No acute intracranial hemorrhage, acute infarction, mass lesion, mass effect, midline shift or hydrocephalus.  Gray-white differentiation is preserved throughout.  Stable appearance of prior left PCA territory infarct and lacunar infarcts in the left thalamus.   Stable cortical and central atrophy with ex vacuo dilatation of the lateral ventricles.  Mild chronic ischemic white matter changes.  Intracranial atherosclerosis in the bilateral cavernous carotid arteries.  Mucoperiosteal thickening and small air fluid level in the left maxillary sinus has slightly progressed compared to prior.  Normal aeration of the mastoid air cells.  IMPRESSION:  1.  No acute intracranial abnormality. 2.  Stable atrophy, remote left PCA territory and left thalamic infarcts 3.  Slight interval progression of chronic left maxillary sinus disease.   Original Report Authenticated By: Malachy Moan, M.D.   Mr Brain Wo Contrast  11/24/2012   *RADIOLOGY REPORT*  Clinical Data: Confusion.  Slurred speech.  MRI HEAD WITHOUT CONTRAST  Technique:  Multiplanar, multiecho pulse sequences of the brain and surrounding structures were obtained according  to standard protocol without intravenous contrast.  Comparison: Head CT same day.  MRI 05/27/2012.  Findings: Diffusion imaging does not show any acute or subacute infarction.  The brainstem is unremarkable.  There are old small vessel cerebellar infarctions.  The cerebral hemispheres show generalized atrophy.  There is old infarction in the left occipital cortical and subcortical region.  There are mild chronic small vessel changes of the deep white matter.  No mass lesion, hemorrhage, hydrocephalus or extra-axial collection.  There is mild mucosal inflammation of the left maxillary sinus.  The other sinuses are clear.  IMPRESSION: No acute finding.  Old left occipital cortical and subcortical infarction.  Old small vessel cerebellar infarctions.   Original Report Authenticated By: Paulina Fusi, M.D.   Dg Chest Port 1 View  (if Code Sepsis Called)  11/24/2012   *RADIOLOGY REPORT*  Clinical Data: Urinary tract infection, stroke symptoms  PORTABLE CHEST - 1 VIEW  Comparison: Prior chest x-ray 08/13/2012  Findings: The lungs are well-aerated and free from  pulmonary edema, focal airspace consolidation or pulmonary nodule.  Cardiac and mediastinal contours are within normal limits.  No pneumothorax, or pleural effusion. No acute osseous findings.  IMPRESSION:  No acute cardiopulmonary disease.   Original Report Authenticated By: Malachy Moan, M.D.    Micro Results      Recent Results (from the past 240 hour(s))  CULTURE, BLOOD (ROUTINE X 2)     Status: None   Collection Time    11/24/12  1:15 AM      Result Value Range Status   Specimen Description BLOOD LEFT HAND   Final   Special Requests BOTTLES DRAWN AEROBIC ONLY 5CC   Final   Culture  Setup Time     Final   Value: 11/24/2012 16:00     Performed at [facility]   Culture     Final   Value:        BLOOD CULTURE RECEIVED NO GROWTH TO DATE CULTURE WILL BE HELD FOR 5 DAYS BEFORE ISSUING A FINAL NEGATIVE REPORT     Performed at [facility]   Report Status PENDING   Incomplete  CULTURE, BLOOD (ROUTINE X 2)     Status: None   Collection Time    11/24/12  1:30 AM      Result Value Range Status   Specimen Description BLOOD RIGHT HAND   Final   Special Requests BOTTLES DRAWN AEROBIC AND ANAEROBIC 5CC EACH   Final   Culture  Setup Time     Final   Value: 11/24/2012 16:00     Performed at [facility]   Culture     Final   Value:        BLOOD CULTURE RECEIVED NO GROWTH TO DATE CULTURE WILL BE HELD FOR 5 DAYS BEFORE ISSUING A FINAL NEGATIVE REPORT     Performed at [facility]   Report Status PENDING   Incomplete  URINE CULTURE     Status: None   Collection Time    11/24/12  1:56 AM      Result Value Range Status   Specimen Description URINE, CLEAN CATCH   Final   Special Requests NONE   Final   Culture  Setup Time     Final   Value: 11/24/2012 03:59     Performed at [facility] Count     Final   Value: >=100,000 COLONIES/ML     Performed at [facility]  Culture     Final   Value: ESCHERICHIA COLI     Performed at  [facility]   Report Status 11/26/2012 FINAL   Final   Organism ID, Bacteria ESCHERICHIA COLI   Final     CBC w Diff: Lab Results  Component Value Date   WBC 8.7 11/25/2012   HGB 13.3 11/25/2012   HCT 37.4* 11/25/2012   PLT 223 11/25/2012   LYMPHOPCT 20 11/24/2012   MONOPCT 7 11/24/2012   EOSPCT 2 11/24/2012   BASOPCT 0 11/24/2012    CMP: Lab Results  Component Value Date   NA 138 11/27/2012   K 3.9 11/27/2012   CL 101 11/27/2012   CO2 23 11/27/2012   BUN 8 11/27/2012   CREATININE 0.76 11/27/2012   PROT 8.1 11/24/2012   ALBUMIN 3.7 11/24/2012   BILITOT 0.3 11/24/2012   ALKPHOS 130* 11/24/2012   AST 16 11/24/2012   ALT 8 11/24/2012  .   Discharge Instructions      Follow with Primary MD [name], MD in 7 days   Get CBC, CMP, checked 7 days by Primary MD and again as instructed by your Primary MD. Get a 2 view Chest X ray done next visit .  Get Medicines reviewed and adjusted.  Please request your Prim.MD to go over all Hospital Tests and Procedure/Radiological results at the follow up, please get all Hospital records sent to your Prim MD by signing hospital release before you go home.  Activity: As tolerated with Full fall precautions use walker/cane & assistance as needed   Diet:  Heart healthy- no carbohydrate with full assistance and aspiration precautions  For Heart failure patients - Check your Weight same time everyday, if you gain over 2 pounds, or you develop in leg swelling, experience more shortness of breath or chest pain, call your Primary MD immediately. Follow Cardiac Low Salt Diet and 1.8 lit/day fluid restriction.  Disposition SNF  If you experience worsening of your admission symptoms, develop shortness of breath, life threatening emergency, suicidal or homicidal thoughts you must seek medical attention immediately by calling 911 or calling your MD immediately  if symptoms less severe.  You Must read complete instructions/literature along with all the  possible adverse reactions/side effects for all the Medicines you take and that have been prescribed to you. Take any new Medicines after you have completely understood and accpet all the possible adverse reactions/side effects.   Do not drive and provide baby sitting services if your were admitted for syncope or siezures until you have seen by Primary MD or a Neurologist and advised to do so again.  Do not drive when taking Pain medications.    Do not take more than prescribed Pain, Sleep and Anxiety Medications  Special Instructions: If you have smoked or chewed Tobacco  in the last 2 yrs please stop smoking, stop any regular Alcohol  and or any Recreational drug use.  Wear Seat belts while driving.   Please note  You were cared for by a hospitalist during your hospital stay. If you have any questions about your discharge medications or the care you received while you were in the hospital after you are discharged, you can call the unit and asked to speak with the hospitalist on call if the hospitalist that took care of you is not available. Once you are discharged, your primary care physician will handle any further medical issues. Please note that NO REFILLS for any discharge medications will be  authorized once you are discharged, as it is imperative that you return to your primary care physician (or establish a relationship with a primary care physician if you do not have one) for your aftercare needs so that they can reassess your need for medications and monitor your lab values.    Follow-up Information   Follow up with [name], MD. Schedule an appointment as soon as possible for a visit in 1 week.   Specialty:  Internal Medicine   Contact information:   [phone] [facility] [address] [place] [address] [phone]         Discharge Medications     Medication List         aspirin 81 MG chewable tablet  Chew 1 tablet (81 mg total) by mouth daily.      atorvastatin 40 MG tablet  Commonly known as:  LIPITOR  Take 40 mg by mouth daily.     baclofen 10 MG tablet  Commonly known as:  LIORESAL  Take 10 mg by mouth 3 (three) times daily.     cefTRIAXone 1 G injection  Commonly known as:  ROCEPHIN  Inject 1 g into the muscle daily.     clopidogrel 75 MG tablet  Commonly known as:  PLAVIX  Take 75 mg by mouth daily.     donepezil 5 MG tablet  Commonly known as:  ARICEPT  Take 1 tablet (5 mg total) by mouth at bedtime.     feeding supplement Liqd  Take 237 mL by mouth 2 (two) times daily between meals.     metoprolol tartrate 25 MG tablet  Commonly known as:  LOPRESSOR  Take 1 tablet (25 mg total) by mouth 2 (two) times daily.     QUEtiapine 50 MG tablet  Commonly known as:  SEROQUEL  Take 1 tablet (50 mg total) by mouth 2 (two) times daily.           Total Time in preparing paper work, data evaluation and todays exam - 35 minutes  Leroy Sea M.D on 11/27/2012 at 11:12 AM  [facility] Office  [phone]

## 2012-11-27 NOTE — Progress Notes (Signed)
Pt incontinent of urine and stool. confused and combative while am care was being done .

## 2012-11-28 NOTE — Progress Notes (Signed)
11/26/12 1400 In to speak with pt. and son, [name], about discharge plans.  Pt. is confused at times.  PT is rec SNF. [name] would like pt. to go to [facility], since he had been there in past, and was satisfied with their care.  He would like to speak with Lupita Leash, CSW, as well.  Contact info:  [phone]).   Tera Mater, RN, BSN NCM [phone]

## 2012-11-29 ENCOUNTER — Non-Acute Institutional Stay (SKILLED_NURSING_FACILITY): Payer: Medicare Other | Admitting: Internal Medicine

## 2012-11-29 DIAGNOSIS — R5381 Other malaise: Secondary | ICD-10-CM

## 2012-11-29 DIAGNOSIS — I6350 Cerebral infarction due to unspecified occlusion or stenosis of unspecified cerebral artery: Secondary | ICD-10-CM

## 2012-11-29 DIAGNOSIS — E43 Unspecified severe protein-calorie malnutrition: Secondary | ICD-10-CM

## 2012-11-29 DIAGNOSIS — E785 Hyperlipidemia, unspecified: Secondary | ICD-10-CM

## 2012-11-29 DIAGNOSIS — F0390 Unspecified dementia without behavioral disturbance: Secondary | ICD-10-CM

## 2012-11-29 DIAGNOSIS — I639 Cerebral infarction, unspecified: Secondary | ICD-10-CM

## 2012-11-29 DIAGNOSIS — N390 Urinary tract infection, site not specified: Secondary | ICD-10-CM

## 2012-11-29 DIAGNOSIS — I2510 Atherosclerotic heart disease of native coronary artery without angina pectoris: Secondary | ICD-10-CM

## 2012-11-29 NOTE — Progress Notes (Signed)
Patient ID: Raymond Antis., male   DOB: [birth year], 79 y.o.   MRN: [id number]  Renette Butters living [place]   PCP: [name], MD  Code Status: full code  No Known Allergies  Chief Complaint: new admit   HPI:  79 y/o male patient is here for STR post hospital admission with acute encephalopathy. He has history of CAD, vascular dementia, CVA, DM among others. He was diagnosed to have a UTI and was put on antibiotics for it. He imporved clinically. MRI brain did not show any acute changes. His chronic medical issues were managed with home medication regimen.  He was seen in his room today. He is in no acute distress. He has confusion at baseline. He denies any complaints. No concerns from staff   Review of Systems:  Denies fever or chills Denies chest pain or dyspnea Denies nausea , vomiting, urinary or bowel complaints Denies headache, blurry vision Denies abdominal pain  Past Medical History  Diagnosis Date  . Mini stroke   . Irregular heart beat   . Blood clot in vein     left leg dvt  . Dementia   . DVT (deep venous thrombosis)   . Hypercholesteremia   . Arthritis   . Diabetes mellitus   . Infectious colitis 08/07/2012  . Dysrhythmia    Past Surgical History  Procedure Laterality Date  . Left arm    . Rotator cuff repair  2000(L) 1999 (R)   Social History:   reports that he quit smoking about 54 years ago. His smoking use included Cigarettes. He has a 20 pack-year smoking history. He has never used smokeless tobacco. He reports that he does not drink alcohol or use illicit drugs.  Family History  Problem Relation Age of Onset  . Heart disease Mother   . Diabetes Mother   . Colon cancer Neg Hx   . Prostate cancer Neg Hx   . Heart disease Father     Medications: Patient's Medications  New Prescriptions   No medications on file  Previous Medications   ASPIRIN 81 MG CHEWABLE TABLET    Chew 1 tablet (81 mg total) by mouth daily.   ATORVASTATIN (LIPITOR) 40 MG TABLET    Take  40 mg by mouth daily.   BACLOFEN (LIORESAL) 10 MG TABLET    Take 10 mg by mouth 3 (three) times daily.   CEFTRIAXONE (ROCEPHIN) 1 G INJECTION    Inject 1 g into the muscle daily.   CLOPIDOGREL (PLAVIX) 75 MG TABLET    Take 75 mg by mouth daily.   DONEPEZIL (ARICEPT) 5 MG TABLET    Take 1 tablet (5 mg total) by mouth at bedtime.   FEEDING SUPPLEMENT (ENSURE COMPLETE) LIQD    Take 237 mL by mouth 2 (two) times daily between meals.   METOPROLOL TARTRATE (LOPRESSOR) 25 MG TABLET    Take 1 tablet (25 mg total) by mouth 2 (two) times daily.   QUETIAPINE (SEROQUEL) 50 MG TABLET    Take 1 tablet (50 mg total) by mouth 2 (two) times daily.  Modified Medications   No medications on file  Discontinued Medications   No medications on file     Physical Exam: Filed Vitals:   11/29/12 1657  BP: 127/71  Pulse: 71  Temp: 97.3 F (36.3 C)  Resp: 16  Height: 5\' 6"  (1.676 m)  Weight: 126 lb (57.153 kg)  SpO2: 96%   gen- elderly male in NAD, frail Neuro- no focal deficit, aao x  3 heent- no pallor, PERRLA, MMM cvs- rrr, no murmur , rub or gallop abdo- bowel sound present, soft, non tender Ext- no edema,able to move all 4, right sided weakness > left Skin- dry, no rash   Labs reviewed: Basic Metabolic Panel:  Recent Labs  16/10/96 0500  08/11/12 0525  08/13/12 0510 08/14/12 0455  11/25/12 0520 11/26/12 0847 11/27/12 0545  NA  --   < > 136  < > 138 135  < > 138 140 138  K  --   < > 3.4*  < > 3.2* 3.6  < > 3.2* 3.8 3.9  CL  --   < > 104  < > 102 101  < > 103 105 101  CO2  --   < > 21  < > 24 23  < > 24 23 23   GLUCOSE  --   < > 100*  < > 118* 87  < > 104* 103* 119*  BUN  --   < > 7  < > 8 7  < > 9 7 8   CREATININE  --   < > 0.79  < > 0.79 0.77  < > 0.69 0.66 0.76  CALCIUM  --   < > 8.0*  < > 8.3* 8.2*  < > 9.0 9.2 9.4  MG 1.5  --  1.9  --  1.8 2.1  --   --   --  2.3  PHOS 2.0*  --  1.8*  --  3.4  --   --   --   --   --   < > = values in this interval not displayed. Liver Function  Tests:  Recent Labs  08/07/12 0725 09/05/12 1220 11/24/12 0126  AST 30 20 16   ALT 11 13 8   ALKPHOS 126* 120* 130*  BILITOT 0.6 0.7 0.3  PROT 7.5 7.9 8.1  ALBUMIN 3.2* 3.3* 3.7    Recent Labs  08/07/12 0725 08/07/12 1200  LIPASE 83* 49   No results found for this basename: AMMONIA,  in the last 8760 hours CBC:  Recent Labs  08/07/12 0725  09/05/12 1220 11/24/12 0126 11/24/12 0138 11/25/12 0520  WBC 13.3*  < > 10.2 11.6*  --  8.7  NEUTROABS 12.3*  --  7.2 8.2*  --   --   HGB 15.3  < > 13.0 14.0 14.6 13.3  HCT 43.7  < > 39.5 40.1 43.0 37.4*  MCV 84.7  < > 86.9 84.1  --  83.3  PLT 244  < > 263.0 262  --  223  < > = values in this interval not displayed. Cardiac Enzymes:  Recent Labs  05/26/12 2031  06/10/12 1703 06/10/12 2312 08/07/12 0725 11/24/12 0127  CKTOTAL 229  --  92 97  --   --   CKMB  --   --  2.7 2.5  --   --   TROPONINI  --   < > <0.30 <0.30 <0.30 <0.30  < > = values in this interval not displayed. BNP: No components found with this basename: POCBNP,  CBG:  Recent Labs  08/08/12 1556 08/12/12 2219 08/13/12 0813  GLUCAP 86 103* 103*    Radiological Exams:  Ct Head Wo Contrast  11/24/2012   *RADIOLOGY REPORT*  Clinical Data: Intermittent slurred speech, history of prior stroke  CT HEAD WITHOUT CONTRAST  Technique:  Contiguous axial images were obtained from the base of the skull through the vertex without contrast.  Comparison: Prior head  CT 05/26/2012; prior brain MRI 05/27/2012  Findings: No acute intracranial hemorrhage, acute infarction, mass lesion, mass effect, midline shift or hydrocephalus.  Gray-white differentiation is preserved throughout.  Stable appearance of prior left PCA territory infarct and lacunar infarcts in the left thalamus.  Stable cortical and central atrophy with ex vacuo dilatation of the lateral ventricles.  Mild chronic ischemic white matter changes.  Intracranial atherosclerosis in the bilateral cavernous carotid  arteries.  Mucoperiosteal thickening and small air fluid level in the left maxillary sinus has slightly progressed compared to prior.  Normal aeration of the mastoid air cells.  IMPRESSION:  1.  No acute intracranial abnormality. 2.  Stable atrophy, remote left PCA territory and left thalamic infarcts 3.  Slight interval progression of chronic left maxillary sinus disease.   Original Report Authenticated By: Malachy Moan, M.D.   Mr Brain Wo Contrast  11/24/2012   *RADIOLOGY REPORT*  Clinical Data: Confusion.  Slurred speech.  MRI HEAD WITHOUT CONTRAST  Technique:  Multiplanar, multiecho pulse sequences of the brain and surrounding structures were obtained according to standard protocol without intravenous contrast.  Comparison: Head CT same day.  MRI 05/27/2012.  Findings: Diffusion imaging does not show any acute or subacute infarction.  The brainstem is unremarkable.  There are old small vessel cerebellar infarctions.  The cerebral hemispheres show generalized atrophy.  There is old infarction in the left occipital cortical and subcortical region.  There are mild chronic small vessel changes of the deep white matter.  No mass lesion, hemorrhage, hydrocephalus or extra-axial collection.  There is mild mucosal inflammation of the left maxillary sinus.  The other sinuses are clear.  IMPRESSION: No acute finding.  Old left occipital cortical and subcortical infarction.  Old small vessel cerebellar infarctions.   Original Report Authenticated By: Paulina Fusi, M.D.   Dg Chest Port 1 View  (if Code Sepsis Called)  11/24/2012   *RADIOLOGY REPORT*  Clinical Data: Urinary tract infection, stroke symptoms  PORTABLE CHEST - 1 VIEW  Comparison: Prior chest x-ray 08/13/2012  Findings: The lungs are well-aerated and free from pulmonary edema, focal airspace consolidation or pulmonary nodule.  Cardiac and mediastinal contours are within normal limits.  No pneumothorax, or pleural effusion. No acute osseous findings.   IMPRESSION: No acute cardiopulmonary disease.   Original Report Authenticated By: Malachy Moan, M.D.     Assessment/Plan  Physical deconditioning- here for STR. Continue working with PT and OT. Fall precautions. Monitor po intake and continue calorie supplement. Check cbc and cmp. Encourage OOB to chair  uti-complete antibiotic course of rocephin for now, encourage hydration  Dementia- likely vascular. Continue seroquel and aricept. No recent behavioral changes. Also continue aspirin, statin  Protein calorie malnutrition- continue 2 cal supplement for now. Encourage po intake  HTN- bp well controlled. Continue metoprolol. Check bmp   CAD- remains chest pain free. Continue aspirin, plavix and statin with b blocker  Hyperlipidemia- continue atorvastatin for now, tolerating it well  CVA- with right sided weakness. Continue metoprolol and bp is well controlled. Also continue asa, plavix and statin

## 2012-11-30 LAB — CULTURE, BLOOD (ROUTINE X 2): Culture: NO GROWTH

## 2012-11-30 NOTE — Clinical Social Work Psychosocial (Addendum)
    Clinical Social Work Department BRIEF PSYCHOSOCIAL ASSESSMENT 11/30/2012  Patient:  Raymond Rangel, Raymond Rangel     Account Number:  [id number]     Admit date:  11/24/2012  Clinical Social Worker:  Tiburcio Pea  Date/Time:  11/26/2012 01:00 PM  Referred by:  RN  Date Referred:  11/26/2012 Referred for  SNF Placement   Other Referral:   Interview type:  Other - See comment Other interview type:   Patient (has dementia but is alert) and his Son Raymond Rangel    PSYCHOSOCIAL DATA Living Status:  WIFE Admitted from facility:   Level of care:   Primary support name:  Raymond Rangel  [phone]  [phone] Primary support relationship to patient:  CHILD, ADULT Degree of support available:   Strong support.    CURRENT CONCERNS Current Concerns  Post-Acute Placement   Other Concerns:   Pt has advanced dementia    SOCIAL WORK ASSESSMENT / PLAN 79 year old male admitted from home where he lived with his wife. Son Raymond Rangel is very supportive and invovled in care of his parents. He states that patient's wife can no longer manage his care at home and her health is fair. She defers to all decisions to [place].  Per son- patient has been at [facility] in the past and they would like to return there if possible.  CSW referred patient to Reyne Dumas, RN Liason and referral will be sent. FL2 placed on chart for MD's signature.   Assessment/plan status:  Psychosocial Support/Ongoing Assessment of Needs Other assessment/ plan:   Information/referral to community resources:   Son defers SNF list as he wants d/c to [facility] and vacacy is probable there.    PATIENT'S/FAMILY'S RESPONSE TO PLAN OF CARE: Patient is alert but has advanced dementia.  He is unable to respond to plan of care. Per nursing, patient has been noted to have increasing confusion at night with agitation and combativeness. Patient's son is coordinating d/c to SNF and feels that this will be safest  arrangement for patient as his mother can no longer manage his care at home.  CSW will monitor and assist with d/c.  Lorri Frederick. Angelicia Lessner, LCSWA [phone]

## 2012-11-30 NOTE — Clinical Social Work Placement (Addendum)
    Clinical Social Work Department CLINICAL SOCIAL WORK PLACEMENT NOTE 11/30/2012  Patient:  Raymond Rangel, Raymond Rangel  Account Number:  [id number] Admit date:  11/24/2012  Clinical Social Worker:  Lupita Leash Taisia Fantini, LCSWA  Date/time:  11/26/2012 05:00 AM  Clinical Social Work is seeking post-discharge placement for this patient at the following level of care:   SKILLED NURSING   (*CSW will update this form in Epic as items are completed)     Patient/family provided with [facility] Department of Clinical Social Work's list of facilities offering this level of care within the geographic area requested by the patient (or if unable, by the patient's family).    Patient/family informed of their freedom to choose among providers that offer the needed level of care, that participate in Medicare, Medicaid or managed care program needed by the patient, have an available bed and are willing to accept the patient.    Patient/family informed of [facility]' ownership interest in [facility], as well as of the fact that they are under no obligation to receive care at this facility.  PASARR submitted to EDS on  PASARR number received from EDS on   FL2 transmitted to all facilities in geographic area requested by pt/family on  11/26/2012 FL2 transmitted to all facilities within larger geographic area on   Patient informed that his/her managed care company has contracts with or will negotiate with  certain facilities, including the following:   Patient has existing PASARR number and son only wants placement at [facility] where he has been a patient in the past.     Patient/family informed of bed offers received:  11/27/2012 Patient chooses bed at [facility] [place] Physician recommends and patient chooses bed at    Patient to be transferred to [facility] [place] on  11/27/2012 Patient to be transferred to facility by Ambulance  [name])  The  following physician request were entered in Epic:   Additional Comments: 11/27/12  OK per MD for d/c today to SNF- Patient remains confused but no behavioral issues noted.  Son notified and is pleased with d/c plan; he will sign admit papers at the facility.  Nursing notified and will call report.  No further CSW needs identified. CSW signing off.  Lorri Frederick. Louna Rothgeb, LCSWA  [phone]

## 2012-12-11 ENCOUNTER — Encounter: Payer: Self-pay | Admitting: Internal Medicine

## 2012-12-11 ENCOUNTER — Non-Acute Institutional Stay (SKILLED_NURSING_FACILITY): Payer: Medicare Other | Admitting: Internal Medicine

## 2012-12-11 DIAGNOSIS — F0151 Vascular dementia with behavioral disturbance: Secondary | ICD-10-CM

## 2012-12-11 DIAGNOSIS — R404 Transient alteration of awareness: Secondary | ICD-10-CM

## 2012-12-11 DIAGNOSIS — F0150 Vascular dementia without behavioral disturbance: Secondary | ICD-10-CM

## 2012-12-11 DIAGNOSIS — I10 Essential (primary) hypertension: Secondary | ICD-10-CM

## 2012-12-11 DIAGNOSIS — R5381 Other malaise: Secondary | ICD-10-CM

## 2012-12-11 DIAGNOSIS — R410 Disorientation, unspecified: Secondary | ICD-10-CM

## 2012-12-11 DIAGNOSIS — F01518 Vascular dementia, unspecified severity, with other behavioral disturbance: Secondary | ICD-10-CM

## 2012-12-11 NOTE — Progress Notes (Signed)
Patient ID: Raymond Rangel., male   DOB: 04/02/1932, 79 y.o.   MRN: [id number] Location: [facility] SNF Provider:  Elmarie Shiley L. Renato Gails, D.O., C.M.D.  Code Status:  Full code   Chief Complaint  Patient presents with  . Acute Visit  . Altered Mental Status    HPI:  79 yo male here for short term rehab and long term care s/p hospitalization for increased right sided weakness and confusion, and he was treated for an urinary tract infection. He has advanced dementia.  He is eating well and drinking well.  He has already had labs including UA, cbc, and bmp that were normal except for mildly low albumin of 3.4.  He has been more confused and trying to climb out of the bed.  His son thought he probably had a UTI or was dehydrated as has happened to him in the past, but labs were unrevealing.  He is on a CONCHO NAS diet.  He is on 2 cal supplement.  He has been running some low blood pressures below 90 over the weekend though his pulse is in the 70s.    Review of Systems:  Review of Systems  Constitutional: Positive for weight loss and malaise/fatigue.  HENT: Negative for congestion.   Respiratory: Negative for shortness of breath.   Cardiovascular: Negative for chest pain.  Gastrointestinal: Negative for abdominal pain and constipation.  Genitourinary: Negative for dysuria, urgency and frequency.  Skin: Negative for rash.  Neurological: Positive for weakness. Negative for dizziness and loss of consciousness.  Psychiatric/Behavioral: Positive for memory loss.    Medications: Patient's Medications  New Prescriptions   No medications on file  Previous Medications   ASPIRIN 81 MG CHEWABLE TABLET    Chew 1 tablet (81 mg total) by mouth daily.   ATORVASTATIN (LIPITOR) 40 MG TABLET    Take 40 mg by mouth daily.   BACLOFEN (LIORESAL) 10 MG TABLET    Take 10 mg by mouth 3 (three) times daily.   CEFTRIAXONE (ROCEPHIN) 1 G INJECTION    Inject 1 g into the muscle daily.   CLOPIDOGREL  (PLAVIX) 75 MG TABLET    Take 75 mg by mouth daily.   DONEPEZIL (ARICEPT) 5 MG TABLET    Take 1 tablet (5 mg total) by mouth at bedtime.   FEEDING SUPPLEMENT (ENSURE COMPLETE) LIQD    Take 237 mL by mouth 2 (two) times daily between meals.   METOPROLOL TARTRATE (LOPRESSOR) 25 MG TABLET    Take 1 tablet (25 mg total) by mouth 2 (two) times daily.   QUETIAPINE (SEROQUEL) 50 MG TABLET    Take 1 tablet (50 mg total) by mouth 2 (two) times daily.  Modified Medications   No medications on file  Discontinued Medications   No medications on file    Physical Exam: Filed Vitals:   12/11/12 1200  BP: 119/78  Pulse: 73  Temp: 97 F (36.1 C)  Resp: 20  Height: 5\' 6"  (1.676 m)  Weight: 127 lb (57.607 kg)  Physical Exam  Constitutional:  Frail black male, nad  Cardiovascular: Normal rate, regular rhythm, normal heart sounds and intact distal pulses.   Pulmonary/Chest: Effort normal and breath sounds normal. No respiratory distress.  Abdominal: Soft. Bowel sounds are normal. He exhibits no distension and no mass. There is no tenderness.  Neurological: He is alert.  Is very confused  Skin: Skin is warm and dry.  Psychiatric: He has a normal mood and affect.  pleasant  Labs reviewed: Basic Metabolic Panel:  Recent Labs  46/96/29 0500  08/11/12 0525  08/13/12 0510 08/14/12 0455  11/25/12 0520 11/26/12 0847 11/27/12 0545  NA  --   < > 136  < > 138 135  < > 138 140 138  K  --   < > 3.4*  < > 3.2* 3.6  < > 3.2* 3.8 3.9  CL  --   < > 104  < > 102 101  < > 103 105 101  CO2  --   < > 21  < > 24 23  < > 24 23 23   GLUCOSE  --   < > 100*  < > 118* 87  < > 104* 103* 119*  BUN  --   < > 7  < > 8 7  < > 9 7 8   CREATININE  --   < > 0.79  < > 0.79 0.77  < > 0.69 0.66 0.76  CALCIUM  --   < > 8.0*  < > 8.3* 8.2*  < > 9.0 9.2 9.4  MG 1.5  --  1.9  --  1.8 2.1  --   --   --  2.3  PHOS 2.0*  --  1.8*  --  3.4  --   --   --   --   --   < > = values in this interval not displayed.  Liver  Function Tests:  Recent Labs  08/07/12 0725 09/05/12 1220 11/24/12 0126  AST 30 20 16   ALT 11 13 8   ALKPHOS 126* 120* 130*  BILITOT 0.6 0.7 0.3  PROT 7.5 7.9 8.1  ALBUMIN 3.2* 3.3* 3.7    CBC:  Recent Labs  08/07/12 0725  09/05/12 1220 11/24/12 0126 11/24/12 0138 11/25/12 0520  WBC 13.3*  < > 10.2 11.6*  --  8.7  NEUTROABS 12.3*  --  7.2 8.2*  --   --   HGB 15.3  < > 13.0 14.0 14.6 13.3  HCT 43.7  < > 39.5 40.1 43.0 37.4*  MCV 84.7  < > 86.9 84.1  --  83.3  PLT 244  < > 263.0 262  --  223  < > = values in this interval not displayed. 11/30/12:  Wbc 8.6, h/h 12.3/36.5, plts 248, Na 139, K 3.7, BUN 11, cr 0.76, alb 3.4, Ca 8.7, AST 15, ALT 10, tb 0.4, UA negative  Assessment/Plan 1. Vascular dementia with behavior disturbance -dementia is progressing, would not change meds -delirium workup has been unrevealing thus far--seems he may have been hypotensive as the cause for his delirium  2. Debility -here for rehab due to weakness, increased falls related to #1  3. Essential hypertension, benign Has been hypotensive in the mornings Add hold parameters for lopressor SBP<100, HR <60  4.  Delirium As described above  Family/ staff Communication: discussed with son who was there visiting and unit supervisor  Labs/tests ordered:  No additional

## 2012-12-19 ENCOUNTER — Ambulatory Visit: Payer: Medicare Other | Admitting: Internal Medicine

## 2012-12-24 ENCOUNTER — Ambulatory Visit: Payer: Medicare Other | Admitting: Internal Medicine

## 2013-01-01 ENCOUNTER — Non-Acute Institutional Stay (SKILLED_NURSING_FACILITY): Payer: Medicare Other | Admitting: Internal Medicine

## 2013-01-01 DIAGNOSIS — F01518 Vascular dementia, unspecified severity, with other behavioral disturbance: Secondary | ICD-10-CM

## 2013-01-01 DIAGNOSIS — R5381 Other malaise: Secondary | ICD-10-CM

## 2013-01-01 DIAGNOSIS — E43 Unspecified severe protein-calorie malnutrition: Secondary | ICD-10-CM

## 2013-01-01 DIAGNOSIS — F0151 Vascular dementia with behavioral disturbance: Secondary | ICD-10-CM

## 2013-01-01 DIAGNOSIS — F0150 Vascular dementia without behavioral disturbance: Secondary | ICD-10-CM

## 2013-01-01 NOTE — Progress Notes (Signed)
Patient ID: Raymond Rangel., male   DOB: 06/27/1931, 79 y.o.   MRN: [id number] Location:  [facility] Yehia Mcbain L. Renato Gails, D.O., C.M.D.  PCP: [name], MD  Code Status: full code  No Known Allergies  Chief Complaint  Patient presents with  . Discharge Note    HPI:  79 y/o male patient was here for STR post hospital admission with acute encephalopathy. He has history of CAD, vascular dementia, CVA, DM among others. He was diagnosed to have a UTI and was put on antibiotics for it. He improved clinically. MRI brain did not show any acute changes. His chronic medical issues were managed with home medication regimen  He has completed his STR here, and feels ready to return home with family.  He has been discharged home several previous times per records (from [facility] and from here), but has had to return.  He has all of the necessary equipment already.  Review of Systems:  Review of Systems  Constitutional: Negative for fever.  HENT: Negative for congestion.   Respiratory: Negative for shortness of breath.   Cardiovascular: Negative for chest pain.  Gastrointestinal: Negative for abdominal pain.  Genitourinary: Negative for dysuria.  Musculoskeletal: Negative for myalgias and falls.  Neurological: Negative for dizziness and headaches.  Psychiatric/Behavioral: Positive for memory loss.     Past Medical History  Diagnosis Date  . Mini stroke   . Irregular heart beat   . Blood clot in vein     left leg dvt  . Dementia   . DVT (deep venous thrombosis)   . Hypercholesteremia   . Arthritis   . Diabetes mellitus   . Infectious colitis 08/07/2012  . Dysrhythmia     Past Surgical History  Procedure Laterality Date  . Left arm    . Rotator cuff repair  2000(L) 1999 (R)    Social History:   reports that he quit smoking about 54 years ago. His smoking use included Cigarettes. He has a 20 pack-year smoking history. He has never used smokeless tobacco. He reports that  he does not drink alcohol or use illicit drugs.  Family History  Problem Relation Age of Onset  . Heart disease Mother   . Diabetes Mother   . Colon cancer Neg Hx   . Prostate cancer Neg Hx   . Heart disease Father     Medications: Patient's Medications  New Prescriptions   No medications on file  Previous Medications   ASPIRIN 81 MG CHEWABLE TABLET    Chew 1 tablet (81 mg total) by mouth daily.   ATORVASTATIN (LIPITOR) 40 MG TABLET    Take 40 mg by mouth daily.   BACLOFEN (LIORESAL) 10 MG TABLET    Take 10 mg by mouth 3 (three) times daily.   CLOPIDOGREL (PLAVIX) 75 MG TABLET    Take 75 mg by mouth daily.   DONEPEZIL (ARICEPT) 5 MG TABLET    Take 1 tablet (5 mg total) by mouth at bedtime.   FEEDING SUPPLEMENT (ENSURE COMPLETE) LIQD    Take 237 mL by mouth 2 (two) times daily between meals.   METOPROLOL TARTRATE (LOPRESSOR) 25 MG TABLET    Take 1 tablet (25 mg total) by mouth 2 (two) times daily.   QUETIAPINE (SEROQUEL) 50 MG TABLET    Take 1 tablet (50 mg total) by mouth 2 (two) times daily.  Modified Medications   No medications on file  Discontinued Medications   No medications on file  Physical Exam: Filed Vitals:   01/01/13 1517  BP: 125/75  Pulse: 63  Temp: 97.6 F (36.4 C)  Resp: 18  Height: 5\' 6"  (1.676 m)  Weight: 128 lb (58.06 kg)   Physical Exam  Constitutional:  Thin black male, nad  HENT:  Head: Normocephalic and atraumatic.  Cardiovascular: Normal rate, regular rhythm, normal heart sounds and intact distal pulses.   Pulmonary/Chest: Effort normal and breath sounds normal. No respiratory distress.  Abdominal: Soft. Bowel sounds are normal. He exhibits no distension. There is no tenderness.  Musculoskeletal: Normal range of motion.  Neurological: He is alert.  Skin: Skin is warm and dry.     Labs reviewed: Basic Metabolic Panel:  Recent Labs  09/81/19 0500  08/11/12 0525  08/13/12 0510 08/14/12 0455  11/25/12 0520 11/26/12 0847  11/27/12 0545  NA  --   < > 136  < > 138 135  < > 138 140 138  K  --   < > 3.4*  < > 3.2* 3.6  < > 3.2* 3.8 3.9  CL  --   < > 104  < > 102 101  < > 103 105 101  CO2  --   < > 21  < > 24 23  < > 24 23 23   GLUCOSE  --   < > 100*  < > 118* 87  < > 104* 103* 119*  BUN  --   < > 7  < > 8 7  < > 9 7 8   CREATININE  --   < > 0.79  < > 0.79 0.77  < > 0.69 0.66 0.76  CALCIUM  --   < > 8.0*  < > 8.3* 8.2*  < > 9.0 9.2 9.4  MG 1.5  --  1.9  --  1.8 2.1  --   --   --  2.3  PHOS 2.0*  --  1.8*  --  3.4  --   --   --   --   --   < > = values in this interval not displayed. Liver Function Tests:  Recent Labs  08/07/12 0725 09/05/12 1220 11/24/12 0126  AST 30 20 16   ALT 11 13 8   ALKPHOS 126* 120* 130*  BILITOT 0.6 0.7 0.3  PROT 7.5 7.9 8.1  ALBUMIN 3.2* 3.3* 3.7    Recent Labs  08/07/12 0725 08/07/12 1200  LIPASE 83* 49  CBC:  Recent Labs  08/07/12 0725  09/05/12 1220 11/24/12 0126 11/24/12 0138 11/25/12 0520  WBC 13.3*  < > 10.2 11.6*  --  8.7  NEUTROABS 12.3*  --  7.2 8.2*  --   --   HGB 15.3  < > 13.0 14.0 14.6 13.3  HCT 43.7  < > 39.5 40.1 43.0 37.4*  MCV 84.7  < > 86.9 84.1  --  83.3  PLT 244  < > 263.0 262  --  223  < > = values in this interval not displayed. Cardiac Enzymes:  Recent Labs  05/26/12 2031  06/10/12 1703 06/10/12 2312 08/07/12 0725 11/24/12 0127  CKTOTAL 229  --  92 97  --   --   CKMB  --   --  2.7 2.5  --   --   TROPONINI  --   < > <0.30 <0.30 <0.30 <0.30  < > = values in this interval not displayed. CBG:  Recent Labs  08/08/12 1556 08/12/12 2219 08/13/12 0813  GLUCAP 86 103* 103*  Procedures and Imaging Studies:  See prior notes Assessment/Plan:   1. Debility -improved significantly during stay--is eating well and gained weight -hopefully, this will continue at home  2. Vascular dementia with behavior disturbance -has done well here -not safe to be left alone, but much improved since admission  3. Protein-calorie malnutrition,  severe -will require close monitoring at home of his weight, access to food and po intake  Patient is being discharged with home health services:  None needed this time  Patient is being discharged with the following durable medical equipment:  None needed  Patient has been advised to f/u with their PCP in 1-2 weeks to bring them up to date on their rehab stay.  They were provided with a 30 day supply of scripts for prescription medications and refills must be obtained from their PCP.

## 2013-01-02 ENCOUNTER — Encounter: Payer: Self-pay | Admitting: Internal Medicine

## 2013-01-03 ENCOUNTER — Telehealth: Payer: Self-pay | Admitting: *Deleted

## 2013-01-03 NOTE — Telephone Encounter (Signed)
Paperwork for "Patient's capability to manage benefits partially completed and placed in providers folder for final completion.

## 2013-01-06 DIAGNOSIS — Z0279 Encounter for issue of other medical certificate: Secondary | ICD-10-CM

## 2013-01-07 ENCOUNTER — Telehealth: Payer: Self-pay | Admitting: *Deleted

## 2013-01-07 NOTE — Telephone Encounter (Signed)
The physical therapist(Julie) was called with a verbal order from Dr. Renato Gails.

## 2013-01-07 NOTE — Telephone Encounter (Signed)
Message copied by Nidhi Jacome L on Mon Jan 07, 2013 10:25 AM ------      Message from: [place], [place] L      Created: Mon Jan 07, 2013  9:05 AM       This is ok with me.              ----- Message -----         From: Lamont Snowball, CMA         Sent: 01/04/2013   4:48 PM           To: Kermit Balo, DO            Patient need order written/verbal to continue physical therapy(2x a week for 6 weeks starting 01/07/2013.        ------

## 2013-01-08 ENCOUNTER — Encounter: Payer: Self-pay | Admitting: Internal Medicine

## 2013-01-08 ENCOUNTER — Telehealth: Payer: Self-pay | Admitting: *Deleted

## 2013-01-08 ENCOUNTER — Ambulatory Visit (INDEPENDENT_AMBULATORY_CARE_PROVIDER_SITE_OTHER): Payer: Medicare Other | Admitting: Internal Medicine

## 2013-01-08 VITALS — BP 104/68 | HR 71 | Temp 97.9°F

## 2013-01-08 DIAGNOSIS — E119 Type 2 diabetes mellitus without complications: Secondary | ICD-10-CM

## 2013-01-08 DIAGNOSIS — N390 Urinary tract infection, site not specified: Secondary | ICD-10-CM

## 2013-01-08 DIAGNOSIS — F0390 Unspecified dementia without behavioral disturbance: Secondary | ICD-10-CM

## 2013-01-08 DIAGNOSIS — I2510 Atherosclerotic heart disease of native coronary artery without angina pectoris: Secondary | ICD-10-CM

## 2013-01-08 DIAGNOSIS — E46 Unspecified protein-calorie malnutrition: Secondary | ICD-10-CM

## 2013-01-08 NOTE — Assessment & Plan Note (Signed)
Last A1c few months ago, < 7.0, CBGs during recent admission were good Plan: A1C on RTC

## 2013-01-08 NOTE — Assessment & Plan Note (Signed)
Recently admitted with a UTI her mental status changes, back to normal

## 2013-01-08 NOTE — Assessment & Plan Note (Signed)
Admitted with mental status changes, back to baseline. Although has not been able to see the skin, he seems to be developing pressure ulcers, strategies for prevention discussed with the patient's wife

## 2013-01-08 NOTE — Patient Instructions (Signed)
Take medications according to the medication list provided Notice he will take  metoprolol only half tablet twice a day To prevent pressure ulcers he needs to be frequently turned, you may like to get a piece of sheep skin. Call anytime if the area in the bottom gets red, swelling or if you see discharge. Next visit in 3 months

## 2013-01-08 NOTE — Telephone Encounter (Signed)
Please do, dx h/o stroke, debility
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
## 2013-01-08 NOTE — Telephone Encounter (Signed)
Received a call from [facility] requesting an order for Skilled Nursing and Social Work for patient. Order needs to be faxed to [phone]. Please advise.

## 2013-01-08 NOTE — Assessment & Plan Note (Signed)
Doing very well with po intake now that he is back home, see ROS

## 2013-01-08 NOTE — Assessment & Plan Note (Signed)
Since the last visit here, he is now on BB, BP slt low today, rec to decrease metotrolol from 25 bid to 12,5 bid

## 2013-01-09 ENCOUNTER — Other Ambulatory Visit: Payer: Self-pay | Admitting: *Deleted

## 2013-01-09 DIAGNOSIS — Z742 Need for assistance at home and no other household member able to render care: Secondary | ICD-10-CM

## 2013-01-09 NOTE — Telephone Encounter (Signed)
Orders for social work and skilled nursing completed and faxed to [place]

## 2013-01-16 ENCOUNTER — Telehealth: Payer: Self-pay | Admitting: Internal Medicine

## 2013-01-16 NOTE — Telephone Encounter (Signed)
Roxanne from [facility] called and left a message stating (Nursing was added to home health services and if its okay to see the patient before his 5 weeks were up for disease management. Also are they able to collect a UA,PRN for symptoms of UTI. THANKS    IF YOU DO NOT REACH HER YOU ARE ABLE TO LEAVE A VOICE MAIL ANSWERING HER QUESTIONS.

## 2013-01-16 NOTE — Telephone Encounter (Signed)
Yes to both questions.

## 2013-01-18 NOTE — Telephone Encounter (Signed)
Pt notified. DJR  

## 2013-01-23 ENCOUNTER — Telehealth: Payer: Self-pay | Admitting: Internal Medicine

## 2013-01-23 NOTE — Telephone Encounter (Signed)
Received a fax from [facility]: Urine culture from 01/19/2012 showed Klebsiella pansensitive, only resistant to ampicillin.  Please call the patient family:  He has a UTI, does he have symptoms? Fever? Has he been treated?. Let me know

## 2013-01-24 NOTE — Telephone Encounter (Signed)
thx

## 2013-01-24 NOTE — Telephone Encounter (Signed)
Spoke with son, states that he is aware and that his father is at a nursing home and has been treated that he knows of.  DJR

## 2013-01-29 ENCOUNTER — Other Ambulatory Visit: Payer: Self-pay | Admitting: Internal Medicine

## 2013-01-30 ENCOUNTER — Other Ambulatory Visit: Payer: Self-pay | Admitting: Internal Medicine

## 2013-02-04 ENCOUNTER — Other Ambulatory Visit: Payer: Self-pay | Admitting: *Deleted

## 2013-02-04 MED ORDER — ATORVASTATIN CALCIUM 40 MG PO TABS: 40.0000 mg | ORAL_TABLET | Freq: Every day | ORAL | Status: DC

## 2013-02-04 NOTE — Telephone Encounter (Signed)
rx refilled per protocol. DJR  

## 2013-02-05 ENCOUNTER — Other Ambulatory Visit: Payer: Self-pay | Admitting: Internal Medicine

## 2013-02-13 ENCOUNTER — Encounter (HOSPITAL_COMMUNITY): Payer: Self-pay | Admitting: Emergency Medicine

## 2013-02-13 ENCOUNTER — Inpatient Hospital Stay (HOSPITAL_COMMUNITY)
Admission: EM | Admit: 2013-02-13 | Discharge: 2013-02-18 | DRG: 689 | Disposition: A | Discharge status: Skilled Nursing Facility | Payer: Medicare Other | Attending: Internal Medicine | Admitting: Internal Medicine

## 2013-02-13 ENCOUNTER — Telehealth: Payer: Self-pay | Admitting: *Deleted

## 2013-02-13 ENCOUNTER — Emergency Department (HOSPITAL_COMMUNITY): Payer: Medicare Other

## 2013-02-13 DIAGNOSIS — F329 Major depressive disorder, single episode, unspecified: Secondary | ICD-10-CM | POA: Diagnosis present

## 2013-02-13 DIAGNOSIS — R410 Disorientation, unspecified: Secondary | ICD-10-CM

## 2013-02-13 DIAGNOSIS — I639 Cerebral infarction, unspecified: Secondary | ICD-10-CM | POA: Diagnosis present

## 2013-02-13 DIAGNOSIS — N390 Urinary tract infection, site not specified: Principal | ICD-10-CM | POA: Diagnosis present

## 2013-02-13 DIAGNOSIS — I2510 Atherosclerotic heart disease of native coronary artery without angina pectoris: Secondary | ICD-10-CM | POA: Diagnosis present

## 2013-02-13 DIAGNOSIS — G9340 Encephalopathy, unspecified: Secondary | ICD-10-CM

## 2013-02-13 DIAGNOSIS — Z7902 Long term (current) use of antithrombotics/antiplatelets: Secondary | ICD-10-CM

## 2013-02-13 DIAGNOSIS — E46 Unspecified protein-calorie malnutrition: Secondary | ICD-10-CM

## 2013-02-13 DIAGNOSIS — Z833 Family history of diabetes mellitus: Secondary | ICD-10-CM

## 2013-02-13 DIAGNOSIS — E119 Type 2 diabetes mellitus without complications: Secondary | ICD-10-CM

## 2013-02-13 DIAGNOSIS — Z8249 Family history of ischemic heart disease and other diseases of the circulatory system: Secondary | ICD-10-CM

## 2013-02-13 DIAGNOSIS — D649 Anemia, unspecified: Secondary | ICD-10-CM | POA: Diagnosis present

## 2013-02-13 DIAGNOSIS — Z79899 Other long term (current) drug therapy: Secondary | ICD-10-CM

## 2013-02-13 DIAGNOSIS — F0390 Unspecified dementia without behavioral disturbance: Secondary | ICD-10-CM | POA: Diagnosis present

## 2013-02-13 DIAGNOSIS — E43 Unspecified severe protein-calorie malnutrition: Secondary | ICD-10-CM

## 2013-02-13 DIAGNOSIS — F3289 Other specified depressive episodes: Secondary | ICD-10-CM | POA: Diagnosis present

## 2013-02-13 DIAGNOSIS — Z87891 Personal history of nicotine dependence: Secondary | ICD-10-CM

## 2013-02-13 DIAGNOSIS — I69959 Hemiplegia and hemiparesis following unspecified cerebrovascular disease affecting unspecified side: Secondary | ICD-10-CM

## 2013-02-13 DIAGNOSIS — Z86718 Personal history of other venous thrombosis and embolism: Secondary | ICD-10-CM

## 2013-02-13 DIAGNOSIS — G929 Unspecified toxic encephalopathy: Secondary | ICD-10-CM | POA: Diagnosis present

## 2013-02-13 DIAGNOSIS — E440 Moderate protein-calorie malnutrition: Secondary | ICD-10-CM | POA: Diagnosis present

## 2013-02-13 DIAGNOSIS — Z9861 Coronary angioplasty status: Secondary | ICD-10-CM

## 2013-02-13 DIAGNOSIS — Z7982 Long term (current) use of aspirin: Secondary | ICD-10-CM

## 2013-02-13 DIAGNOSIS — E785 Hyperlipidemia, unspecified: Secondary | ICD-10-CM

## 2013-02-13 DIAGNOSIS — R5381 Other malaise: Secondary | ICD-10-CM

## 2013-02-13 DIAGNOSIS — G92 Toxic encephalopathy: Secondary | ICD-10-CM | POA: Diagnosis present

## 2013-02-13 DIAGNOSIS — B961 Klebsiella pneumoniae [K. pneumoniae] as the cause of diseases classified elsewhere: Secondary | ICD-10-CM | POA: Diagnosis present

## 2013-02-13 LAB — URINALYSIS, ROUTINE W REFLEX MICROSCOPIC
Glucose, UA: NEGATIVE mg/dL
Nitrite: NEGATIVE
Protein, ur: NEGATIVE mg/dL
pH: 5.5 (ref 5.0–8.0)

## 2013-02-13 LAB — COMPREHENSIVE METABOLIC PANEL
Albumin: 3.5 g/dL (ref 3.5–5.2)
BUN: 8 mg/dL (ref 6–23)
Creatinine, Ser: 0.89 mg/dL (ref 0.50–1.35)
Total Bilirubin: 0.3 mg/dL (ref 0.3–1.2)
Total Protein: 8.1 g/dL (ref 6.0–8.3)

## 2013-02-13 LAB — CBC WITH DIFFERENTIAL/PLATELET
Basophils Relative: 1 % (ref 0–1)
Eosinophils Absolute: 0.4 10*3/uL (ref 0.0–0.7)
HCT: 31.8 % — ABNORMAL LOW (ref 39.0–52.0)
Hemoglobin: 10.9 g/dL — ABNORMAL LOW (ref 13.0–17.0)
MCH: 29.6 pg (ref 26.0–34.0)
MCHC: 34.3 g/dL (ref 30.0–36.0)
Monocytes Absolute: 0.8 10*3/uL (ref 0.1–1.0)
Monocytes Relative: 8 % (ref 3–12)

## 2013-02-13 LAB — URINE MICROSCOPIC-ADD ON

## 2013-02-13 MED ORDER — DEXTROSE 5 % IV SOLN
1.0000 g | Freq: Once | INTRAVENOUS | Status: AC
  Administered 2013-02-13: 1 g via INTRAVENOUS
  Filled 2013-02-13: qty 10
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
## 2013-02-13 NOTE — ED Notes (Signed)
Pt to ED via [facility] with c/o altered loc.  EMS reports pt's son st's pt has had difficulty voiding over past couple of days.  St's urine has strong odor.  Son also reports when pt has had UTI's he has had altered mental status.

## 2013-02-13 NOTE — ED Notes (Signed)
Pt still not produced any urine output.

## 2013-02-13 NOTE — ED Notes (Signed)
Received report from off going RN and introduced self to the pt and no new needs at this time but will continue to monitor

## 2013-02-13 NOTE — Telephone Encounter (Signed)
Spoke with patient's wife and informed her that [name] would like for patient to be evaluated in the ER today. She verbalized understanding.

## 2013-02-13 NOTE — ED Notes (Signed)
Pt not producing any urine.

## 2013-02-13 NOTE — ED Notes (Signed)
Condom catheter applied.

## 2013-02-13 NOTE — Telephone Encounter (Signed)
Received phone call from Caryn Bee at [facility], a physical therapist, stating that while visiting patient today the patients wife told him that patient has been very confused since Monday. She states that he has been talking about deceased family members like they are still alive. She has also noticed a strong smell to his urine. The physical therapist stated that patient seems very lethargic today. A nurse from [facility] is there currently and would like to know if a UA should be repeated. UA done last week was negative. Please advise.

## 2013-02-13 NOTE — Telephone Encounter (Signed)
I think he needs to be seen (confussed, very lethargic)  rec ER eval today

## 2013-02-13 NOTE — ED Notes (Signed)
Dr. James at the bedside.  

## 2013-02-14 ENCOUNTER — Encounter (HOSPITAL_COMMUNITY): Payer: Self-pay | Admitting: Internal Medicine

## 2013-02-14 ENCOUNTER — Inpatient Hospital Stay (HOSPITAL_COMMUNITY): Payer: Medicare Other

## 2013-02-14 ENCOUNTER — Telehealth: Payer: Self-pay | Admitting: *Deleted

## 2013-02-14 DIAGNOSIS — D649 Anemia, unspecified: Secondary | ICD-10-CM | POA: Diagnosis present

## 2013-02-14 DIAGNOSIS — N390 Urinary tract infection, site not specified: Principal | ICD-10-CM

## 2013-02-14 DIAGNOSIS — I6350 Cerebral infarction due to unspecified occlusion or stenosis of unspecified cerebral artery: Secondary | ICD-10-CM

## 2013-02-14 DIAGNOSIS — G9340 Encephalopathy, unspecified: Secondary | ICD-10-CM

## 2013-02-14 DIAGNOSIS — F0390 Unspecified dementia without behavioral disturbance: Secondary | ICD-10-CM

## 2013-02-14 LAB — OCCULT BLOOD, POC DEVICE: Fecal Occult Bld: NEGATIVE

## 2013-02-14 LAB — CBC WITH DIFFERENTIAL/PLATELET
Basophils Absolute: 0.1 10*3/uL (ref 0.0–0.1)
Basophils Relative: 1 % (ref 0–1)
Eosinophils Absolute: 0.5 10*3/uL (ref 0.0–0.7)
Eosinophils Relative: 7 % — ABNORMAL HIGH (ref 0–5)
Hemoglobin: 11.4 g/dL — ABNORMAL LOW (ref 13.0–17.0)
Lymphs Abs: 1.9 10*3/uL (ref 0.7–4.0)
MCH: 29.5 pg (ref 26.0–34.0)
MCHC: 34.8 g/dL (ref 30.0–36.0)
MCV: 84.8 fL (ref 78.0–100.0)
Monocytes Absolute: 0.6 10*3/uL (ref 0.1–1.0)
Neutrophils Relative %: 59 % (ref 43–77)
Platelets: 214 10*3/uL (ref 150–400)
RBC: 3.87 MIL/uL — ABNORMAL LOW (ref 4.22–5.81)
RDW: 15.4 % (ref 11.5–15.5)

## 2013-02-14 LAB — COMPREHENSIVE METABOLIC PANEL
ALT: 8 U/L (ref 0–53)
AST: 16 U/L (ref 0–37)
Albumin: 3.1 g/dL — ABNORMAL LOW (ref 3.5–5.2)
Alkaline Phosphatase: 126 U/L — ABNORMAL HIGH (ref 39–117)
Calcium: 8.5 mg/dL (ref 8.4–10.5)
Potassium: 2.9 mEq/L — ABNORMAL LOW (ref 3.5–5.1)
Sodium: 140 mEq/L (ref 135–145)
Total Protein: 6.8 g/dL (ref 6.0–8.3)

## 2013-02-14 LAB — GLUCOSE, CAPILLARY
Glucose-Capillary: 59 mg/dL — ABNORMAL LOW (ref 70–99)
Glucose-Capillary: 80 mg/dL (ref 70–99)
Glucose-Capillary: 90 mg/dL (ref 70–99)

## 2013-02-14 LAB — RETICULOCYTES: Retic Ct Pct: 1.4 % (ref 0.4–3.1)

## 2013-02-14 LAB — IRON AND TIBC
Iron: 43 ug/dL (ref 42–135)
Saturation Ratios: 21 % (ref 20–55)
TIBC: 204 ug/dL — ABNORMAL LOW (ref 215–435)
UIBC: 161 ug/dL (ref 125–400)

## 2013-02-14 LAB — TSH: TSH: 4.232 u[IU]/mL (ref 0.350–4.500)

## 2013-02-14 LAB — FERRITIN: Ferritin: 81 ng/mL (ref 22–322)

## 2013-02-14 MED ORDER — ACETAMINOPHEN 325 MG PO TABS: 650.0000 mg | ORAL_TABLET | Freq: Four times a day (QID) | ORAL | Status: DC | PRN

## 2013-02-14 MED ORDER — ENSURE COMPLETE PO LIQD
237.0000 mL | Freq: Two times a day (BID) | ORAL | Status: DC
  Administered 2013-02-14 (×2): 237 mL via ORAL

## 2013-02-14 MED ORDER — ACETAMINOPHEN 650 MG RE SUPP: 650.0000 mg | Freq: Four times a day (QID) | RECTAL | Status: DC | PRN

## 2013-02-14 MED ORDER — DONEPEZIL HCL 5 MG PO TABS
5.0000 mg | ORAL_TABLET | Freq: Every day | ORAL | Status: DC
  Administered 2013-02-14 – 2013-02-17 (×5): 5 mg via ORAL
  Filled 2013-02-14 (×6): qty 1

## 2013-02-14 MED ORDER — POTASSIUM CHLORIDE CRYS ER 20 MEQ PO TBCR
40.0000 meq | EXTENDED_RELEASE_TABLET | ORAL | Status: AC
  Administered 2013-02-14 (×2): 40 meq via ORAL
  Filled 2013-02-14 (×2): qty 2

## 2013-02-14 MED ORDER — DEXTROSE 5 % IV SOLN
1.0000 g | INTRAVENOUS | Status: DC
  Administered 2013-02-14 – 2013-02-17 (×4): 1 g via INTRAVENOUS
  Filled 2013-02-14 (×6): qty 10

## 2013-02-14 MED ORDER — METOPROLOL TARTRATE 25 MG PO TABS
25.0000 mg | ORAL_TABLET | Freq: Two times a day (BID) | ORAL | Status: DC
  Administered 2013-02-14 – 2013-02-18 (×10): 25 mg via ORAL
  Filled 2013-02-14 (×11): qty 1

## 2013-02-14 MED ORDER — ONDANSETRON HCL 4 MG/2ML IJ SOLN: 4.0000 mg | Freq: Four times a day (QID) | INTRAMUSCULAR | Status: DC | PRN

## 2013-02-14 MED ORDER — BOOST / RESOURCE BREEZE PO LIQD
1.0000 | Freq: Three times a day (TID) | ORAL | Status: DC
  Administered 2013-02-14 – 2013-02-18 (×11): 1 via ORAL

## 2013-02-14 MED ORDER — POTASSIUM CHLORIDE IN NACL 20-0.9 MEQ/L-% IV SOLN
INTRAVENOUS | Status: AC
  Administered 2013-02-14: 1 mL via INTRAVENOUS
  Administered 2013-02-14: 02:00:00 via INTRAVENOUS
  Filled 2013-02-14 (×2): qty 1000

## 2013-02-14 MED ORDER — ATORVASTATIN CALCIUM 40 MG PO TABS
40.0000 mg | ORAL_TABLET | Freq: Every day | ORAL | Status: DC
  Administered 2013-02-14 – 2013-02-17 (×4): 40 mg via ORAL
  Filled 2013-02-14 (×5): qty 1

## 2013-02-14 MED ORDER — INSULIN ASPART 100 UNIT/ML ~~LOC~~ SOLN
0.0000 [IU] | Freq: Three times a day (TID) | SUBCUTANEOUS | Status: DC
  Administered 2013-02-18: 1 [IU] via SUBCUTANEOUS

## 2013-02-14 MED ORDER — CLOPIDOGREL BISULFATE 75 MG PO TABS
75.0000 mg | ORAL_TABLET | Freq: Every day | ORAL | Status: DC
  Administered 2013-02-14 – 2013-02-18 (×5): 75 mg via ORAL
  Filled 2013-02-14 (×6): qty 1

## 2013-02-14 MED ORDER — ONDANSETRON HCL 4 MG PO TABS: 4.0000 mg | ORAL_TABLET | Freq: Four times a day (QID) | ORAL | Status: DC | PRN

## 2013-02-14 MED ORDER — QUETIAPINE FUMARATE 50 MG PO TABS
50.0000 mg | ORAL_TABLET | Freq: Two times a day (BID) | ORAL | Status: DC
  Administered 2013-02-14 – 2013-02-18 (×10): 50 mg via ORAL
  Filled 2013-02-14 (×11): qty 1

## 2013-02-14 MED ORDER — ENOXAPARIN SODIUM 40 MG/0.4ML ~~LOC~~ SOLN
40.0000 mg | SUBCUTANEOUS | Status: DC
  Administered 2013-02-14: 40 mg via SUBCUTANEOUS
  Filled 2013-02-14 (×2): qty 0.4

## 2013-02-14 MED ORDER — BACLOFEN 10 MG PO TABS
10.0000 mg | ORAL_TABLET | Freq: Three times a day (TID) | ORAL | Status: DC
  Administered 2013-02-14 – 2013-02-18 (×13): 10 mg via ORAL
  Filled 2013-02-14 (×16): qty 1

## 2013-02-14 MED ORDER — ASPIRIN 81 MG PO CHEW
81.0000 mg | CHEWABLE_TABLET | Freq: Every day | ORAL | Status: DC
  Administered 2013-02-14 – 2013-02-18 (×5): 81 mg via ORAL
  Filled 2013-02-14 (×5): qty 1

## 2013-02-14 NOTE — H&P (Signed)
[facility] History and Physical  Intel. JWJ:[id number] DOB: Sep 18, 1931 DOA: 02/13/2013  Referring physician: ER physician per PCP: [name], MD   Chief Complaint: Altered mental status.  HPI: Raymond Rangel. is a 79 y.o. male with known history of dementia, CVA with right-sided hemiparesis, CAD was brought to the ER after patient's son found that the patient was cleaning increasingly confused over the last 2 days. Patient otherwise, as per the son did not have any nausea vomiting abdominal pain diarrhea chest pain shortness of breath fever chills. In the ER patient was found to be confused. Afebrile. UA shows features of UTI and has been admitted for acute encephalopathy most likely secondary to UTI.   Review of Systems: As presented in the history of presenting illness, rest negative.  Past Medical History  Diagnosis Date  . Stroke 04-2011  . Irregular heart beat   . Dementia   . DVT (deep venous thrombosis)     L leg 04-2011  . Hypercholesteremia   . Arthritis   . Diabetes mellitus   . Infectious colitis 08/07/2012  . CAD (coronary artery disease)     stent 2005  . DJD (degenerative joint disease)   . H/O ETOH abuse    Past Surgical History  Procedure Laterality Date  . Left arm    . Rotator cuff repair  2000(L) 1999 (R)   Social History:  reports that he quit smoking about 54 years ago. His smoking use included Cigarettes. He has a 20 pack-year smoking history. He has never used smokeless tobacco. He reports that he does not drink alcohol or use illicit drugs. Where does patient live home. Can patient participate in ADLs? No.  No Known Allergies  Family History:  Family History  Problem Relation Age of Onset  . Heart disease Mother   . Diabetes Mother   . Colon cancer Neg Hx   . Prostate cancer Neg Hx   . Heart disease Father       Prior to Admission medications   Medication Sig Start Date End Date Taking? Authorizing Provider  aspirin 81 MG  chewable tablet Chew 1 tablet (81 mg total) by mouth daily. 06/25/12  Yes Daniel J Angiulli, PA-C  atorvastatin (LIPITOR) 40 MG tablet Take 1 tablet (40 mg total) by mouth daily. 02/04/13  Yes Wanda Plump, MD  baclofen (LIORESAL) 10 MG tablet Take 10 mg by mouth 3 (three) times daily.   Yes Historical Provider, MD  clopidogrel (PLAVIX) 75 MG tablet Take 75 mg by mouth daily.   Yes Historical Provider, MD  donepezil (ARICEPT) 5 MG tablet Take 1 tablet (5 mg total) by mouth at bedtime. 10/22/12  Yes Wanda Plump, MD  feeding supplement (ENSURE COMPLETE) LIQD Take 237 mL by mouth 2 (two) times daily between meals. 11/27/12  Yes Leroy Sea, MD  metoprolol tartrate (LOPRESSOR) 25 MG tablet Take 25 mg by mouth 2 (two) times daily.   Yes Historical Provider, MD  QUEtiapine (SEROQUEL) 50 MG tablet Take 1 tablet (50 mg total) by mouth 2 (two) times daily. 10/25/12  Yes Wanda Plump, MD    Physical Exam: Filed Vitals:   02/13/13 2245 02/13/13 2300 02/13/13 2315 02/13/13 2330  BP: 148/77 151/101 153/68 146/74  Pulse: 25     Temp:      TempSrc:      Resp: 24 24 16 20   SpO2: 98%        General:  Well-developed and nourished.  Eyes: Anicteric no pallor.  ENT: No discharge from ears eyes nose mouth.  Neck: No mass felt.  Cardiovascular: S1-S2 heard.  Respiratory: No rhonchi or crepitations.  Abdomen: Soft nontender bowel sounds present.  Skin: No rash.  Musculoskeletal: No edema.  Psychiatric: Confused and oriented to name only.  Neurologic: Moves all extremities patient has known history of right-sided hemiparesis.  Labs on Admission:  Basic Metabolic Panel:  Recent Labs Lab 02/13/13 1921  NA 137  K 3.2*  CL 102  CO2 24  GLUCOSE 94  BUN 8  CREATININE 0.89  CALCIUM 9.1   Liver Function Tests:  Recent Labs Lab 02/13/13 1921  AST 19  ALT 10  ALKPHOS 149*  BILITOT 0.3  PROT 8.1  ALBUMIN 3.5   No results found for this basename: LIPASE, AMYLASE,  in the last 168  hours No results found for this basename: AMMONIA,  in the last 168 hours CBC:  Recent Labs Lab 02/13/13 1921  WBC 9.9  NEUTROABS 5.8  HGB 10.9*  HCT 31.8*  MCV 86.4  PLT 297   Cardiac Enzymes: No results found for this basename: CKTOTAL, CKMB, CKMBINDEX, TROPONINI,  in the last 168 hours  BNP (last 3 results) No results found for this basename: PROBNP,  in the last 8760 hours CBG: No results found for this basename: GLUCAP,  in the last 168 hours  Radiological Exams on Admission: Dg Chest Port 1 View  02/13/2013   CLINICAL DATA:  Altered mental status, history of strokes  EXAM: PORTABLE CHEST - 1 VIEW  COMPARISON:  11/24/2012  FINDINGS: Heart size and vascular pattern normal. Bilateral nipple shadows identified. Lungs clear. Elevated bilateral humeral heads with degenerative changes stable.  IMPRESSION: No active disease.   Electronically Signed   By: Esperanza Heir M.D.   On: 02/13/2013 20:22     Assessment/Plan Principal Problem:   Acute encephalopathy Active Problems:   CAD (coronary artery disease)   Dementia   CVA (cerebral infarction)   Urinary tract infection   Anemia   1. Acute encephalopathy most likely secondary to UTI - follow urine cultures. Continue ceftriaxone. Check CT head. 2. Anemia - patient's hemoglobin has decreased by almost 3 g since last admission 3 months ago. Stool for occult blood is negative. Check anemia panel and closely follow CBC. 3. History of CVA with right-sided hemiparesis - continue antiplatelet agents for now. Closely follow CBC. 4. History of dementia - continue Aricept. 5. History of diabetes mellitus on chart presently on no medications - follow CBGs.    Code Status: Full code.  Family Communication: Patient's son at the bedside.  Disposition Plan: Admit to inpatient.    Fender Herder N. [facility] Pager [phone].  If 7PM-7AM, please contact night-coverage [email or web address] Password [facility] 02/14/2013, 12:31  AM

## 2013-02-14 NOTE — Progress Notes (Addendum)
INITIAL NUTRITION ASSESSMENT  DOCUMENTATION CODES Per approved criteria  -Not Applicable   INTERVENTION:  1. D/C Ensure Complete  2. Resource Breeze po TID, each supplement provides 250 kcal and 9 grams of protein.  NUTRITION DIAGNOSIS: Increased nutrient needs related to infection as evidenced by estimated needs.   Goal: Pt to meet >/= 90% of their estimated nutrition needs   Monitor:  PO intake, weight trend, labs  Reason for Assessment: Pt identified as at nutrition risk on the Malnutrition Screen Tool  79 y.o. male  Admitting Dx: Acute encephalopathy  ASSESSMENT: Pt with known hx of dementia and CVA admitted with altered mental status. Pt sitting up in his bed, 100% of his meal consumed.  Per pt he has a good appetite, doesn't drink ensure at home but likes it and is willing to drink here. Pt identified as being malnourished at last admission, however pt's weight is stable and slightly up from previous admission. Spoke with son by phone. Per son pt has a good appetite at home and eats well. Pt does not like milk and would prefer Breeze.  Potassium is low and being repleted IV.   Nutrition Focused Physical Exam:  Subcutaneous Fat:  Orbital Region: WNL Upper Arm Region: WNL Thoracic and Lumbar Region: WNL  Muscle:  Temple Region: mild-moderate wasting Clavicle Bone Region: mild-moderate wasting Clavicle and Acromion Bone Region: WNL Scapular Bone Region: WNL Dorsal Hand: WNL Patellar Region: WNL Anterior Thigh Region: mild-moderate wasting Posterior Calf Region: WNL  Edema: not present   Height: Ht Readings from Last 1 Encounters:  02/14/13 5\' 6"  (1.676 m)    Weight: Wt Readings from Last 1 Encounters:  02/14/13 132 lb 9.6 oz (60.147 kg)    Ideal Body Weight: 64.5 kg   % Ideal Body Weight: 90%  Wt Readings from Last 10 Encounters:  02/14/13 132 lb 9.6 oz (60.147 kg)  01/01/13 128 lb (58.06 kg)  12/11/12 127 lb (57.607 kg)  11/29/12 126 lb  (57.153 kg)  11/26/12 123 lb 6.4 oz (55.974 kg)  08/16/12 125 lb (56.7 kg)  08/09/12 135 lb 9.3 oz (61.5 kg)  07/06/12 136 lb (61.689 kg)  06/20/12 136 lb 11 oz (62 kg)  05/27/12 136 lb 3.2 oz (61.78 kg)    Usual Body Weight: per records 136 lb 2/14  % Usual Body Weight: 97%  BMI:  Body mass index is 21.41 kg/(m^2).  Estimated Nutritional Needs: Kcal: 1600-1800 Protein: 70-80 grams Fluid: >1.6 L/day  Skin: no issues noted  Diet Order: Cardiac Meal Completion: 100%   EDUCATION NEEDS: -No education needs identified at this time   Intake/Output Summary (Last 24 hours) at 02/14/13 1016 Last data filed at 02/14/13 0618  Gross per 24 hour  Intake    345 ml  Output    375 ml  Net    -30 ml    Last BM: PTA   Labs:   Recent Labs Lab 02/13/13 1921 02/14/13 0500  NA 137 140  K 3.2* 2.9*  CL 102 106  CO2 24 23  BUN 8 7  CREATININE 0.89 0.74  CALCIUM 9.1 8.5  GLUCOSE 94 92    CBG (last 3)   Recent Labs  02/14/13 0638  GLUCAP 92    Scheduled Meds: . aspirin  81 mg Oral Daily  . atorvastatin  40 mg Oral q1800  . baclofen  10 mg Oral TID  . cefTRIAXone (ROCEPHIN)  IV  1 g Intravenous Q24H  . clopidogrel  75 mg  Oral Q breakfast  . donepezil  5 mg Oral QHS  . feeding supplement (ENSURE COMPLETE)  237 mL Oral BID BM  . insulin aspart  0-9 Units Subcutaneous TID WC  . metoprolol tartrate  25 mg Oral BID  . potassium chloride  40 mEq Oral Q4H  . QUEtiapine  50 mg Oral BID    Continuous Infusions: . 0.9 % NaCl with KCl 20 mEq / L 75 mL/hr at 02/14/13 1610    Past Medical History  Diagnosis Date  . Stroke 04-2011  . Irregular heart beat   . Dementia   . DVT (deep venous thrombosis)     L leg 04-2011  . Hypercholesteremia   . Arthritis   . Diabetes mellitus   . Infectious colitis 08/07/2012  . CAD (coronary artery disease)     stent 2005  . DJD (degenerative joint disease)   . H/O ETOH abuse     Past Surgical History  Procedure Laterality Date   . Left arm    . Rotator cuff repair  2000(L) 1999 (R)    Kendell Bane RD, LDN, CNSC [phone] Pager [phone] After Hours Pager

## 2013-02-14 NOTE — Progress Notes (Signed)
Patient seen and examined. Admitted after midnight secondary to acute change in mental status. Patient with underlying hx of dementia and previous stroke. CT head w/o acute intracranial abnormalities and stable chronic ischemic process. Found with UTI. Please referred to H&P by Dr. Toniann Fail for further info/details.  Plan: -follow urine cx; continue rocephin -will check TSH, B12 -PT evaluation  Zadok Holaway [phone]

## 2013-02-14 NOTE — Progress Notes (Signed)
Utilization review completed. Keeton Kassebaum, RN, BSN. 

## 2013-02-14 NOTE — Progress Notes (Signed)
Pt potassium level 2.9. Provider on call paged about critical value. Will continue to monitor.

## 2013-02-14 NOTE — Evaluation (Addendum)
Physical Therapy Evaluation Patient Details Name: Raymond Rangel. MRN: [id number] DOB: 1931-05-21 Today's Date: 02/14/2013 Time: 7829-5621 PT Time Calculation (min): 54 min  PT Assessment / Plan / Recommendation History of Present Illness  79 y.o. male with history of dementia, previous stroke with right-sided weakness, hyperlipidemia was brought to the ER after patient's family found that patient has been increasingly confused. Found to have UTI  Clinical Impression  Pt currently requires +2/max assist for sitting balance and transfers. Wife in room, a lot of time spent discussing pt's level of care (very heavy). Wife reports that she is "worn out" and has very little help at home. Says she feels "lifeless." Pt has significantly impaired cognition which makes transfers difficult and risky, wife admits pt had a controlled fall just this Monday. By end of session wife in agreement that SNF best option at this time but has concerns about financial aspects. Will follow pt acutely for deficits listed below.     PT Assessment  Patient needs continued PT services    Follow Up Recommendations  SNF;Supervision/Assistance - 24 hour       Barriers to Discharge Decreased caregiver support      Equipment Recommendations  None recommended by PT, assessment at SNF      Frequency Min 2X/week    Precautions / Restrictions Precautions Precautions: Fall Precaution Comments: h/o falls in the home with wife during transfers   Pertinent Vitals/Pain No c/o      Mobility  Bed Mobility Bed Mobility: Rolling Right;Rolling Left Rolling Right: 4: Min assist Rolling Left: 3: Mod assist Details for Bed Mobility Assistance: Cues and assist for sequencing to complete rolling while pt being cleaned for large bowel incontinence.  Transfers Transfers: Sit to Stand;Stand to Sit Sit to Stand: 2: Max assist;3: Mod assist;From chair/3-in-1;From bed Stand to Sit: 2: Max assist;To chair/3-in-1;To bed Details  for Transfer Assistance: Performed twice, Rt. foot must have pressure applied through foot/foot blocked to keep foot on floor during initiation of transition to standing. Simple cues to stand up tall work best. Pt requires cues to bring Lt. UE from bed to RW.  Stand pivot transfers 2: max assist Details for Transfer Assistance: Max cues with first attempt, pt unable to follow and had to be seated. Second attempt pt able to follow cues much better, able to step with Rt. To transfer to chair. Wife reports inconsistent ability to follow commands is one reason why transfers are so difficult.  Ambulation/Gait Ambulation/Gait Assistance:  (unsafe to attempt)        PT Diagnosis: Difficulty walking;Generalized weakness;Altered mental status  PT Problem List: Decreased strength;Decreased activity tolerance;Decreased range of motion;Decreased balance;Decreased mobility;Decreased coordination;Decreased cognition;Impaired sensation;Decreased safety awareness;Decreased knowledge of use of DME;Decreased skin integrity PT Treatment Interventions: DME instruction;Gait training;Functional mobility training;Therapeutic activities;Therapeutic exercise;Balance training;Neuromuscular re-education;Cognitive remediation;Patient/family education     PT Goals(Current goals can be found in the care plan section) Acute Rehab PT Goals Patient Stated Goal: Pt states "ok" when asked if he wanted to get OOB PT Goal Formulation: With patient/family Time For Goal Achievement: 02/21/13 Potential to Achieve Goals: Fair  Visit Information  Last PT Received On: 02/14/13 Assistance Needed: +2 History of Present Illness: 79 y.o. male with history of dementia, previous stroke with right-sided weakness, hyperlipidemia was brought to the ER after patient's family found that patient has been increasingly confused. Found to have UTI       Prior Functioning  Home Living Family/patient expects to be discharged to:: Skilled nursing  facility Additional Comments: pt unable to provide history Prior Function Level of Independence: Needs assistance Gait / Transfers Assistance Needed: mod/max ADL's / Homemaking Assistance Needed: total assist Comments: Pt was ambulating only a few steps, spent most of days in wheelchair. Able to transfer with RW and wife but was a difficult transfer for wife.  Communication Communication: Other (comment) (difficult to assess, has trouble following commands)    Cognition  Cognition Overall Cognitive Status: Impaired/Different from baseline (long standing cognitive deficits from CVA and dementia) Area of Impairment: Attention;Following commands (new impairements include) Current Attention Level: Sustained Memory: Decreased short-term memory Following Commands: Follows one step commands inconsistently General Comments: Pt requires repeated, simple commands to carry through with task    Extremity/Trunk Assessment Upper Extremity Assessment Upper Extremity Assessment: Defer to OT evaluation Lower Extremity Assessment Lower Extremity Assessment: RLE deficits/detail;LLE deficits/detail RLE Deficits / Details: Extensor tone exacerbated with activity, goes into full extension and adduction with attempt to stand. Foot must be blocked to keep it on the floor. Difficulty following commands so MMT not accurate, pt able to support self in standing.  LLE Deficits / Details: Generalized weakness but better motor control than Rt. LE.unable to accurately assess secondary to cognition Cervical / Trunk Assessment Cervical / Trunk Assessment: Kyphotic;Other exceptions Cervical / Trunk Exceptions: posterior pelvic tilt in sitting   Balance Balance Balance Assessed: Yes Static Sitting Balance Static Sitting - Balance Support: Bilateral upper extremity supported Static Sitting - Level of Assistance: 1: +2 Total assist;4: Min assist Static Sitting - Comment/# of Minutes: +2 assist needed for initial sitting  balance as pt has significant posterior lean, after facilitation through pelvis to promote anterior trunk weight shift pt able to maintaining sitting balance with min assist.  Static Standing Balance Static Standing - Balance Support: Bilateral upper extremity supported Static Standing - Level of Assistance: 2: Max assist;3: Mod assist Static Standing - Comment/# of Minutes: max progressing to mod assist due to posterior lean. Pt reverts to max assist when distracted or when he has difficulty following commands.   End of Session PT - End of Session Equipment Utilized During Treatment: Gait belt Activity Tolerance: Patient limited by fatigue;Patient tolerated treatment well;Other (comment) (limited by confusion) Patient left: in chair;with call bell/phone within reach;with family/visitor present Nurse Communication: Mobility status  GP     Wilhemina Bonito 02/14/2013, 3:13 PM

## 2013-02-14 NOTE — Telephone Encounter (Signed)
Received call from Raynelle Fanning, PT from [place], requesting an extension for PT for patient. The extension would be for 2 times per week for two additional weeks effective 01/18/2013.   *verbal order can be made by calling Raynelle Fanning at [phone]

## 2013-02-14 NOTE — Telephone Encounter (Signed)
That is okay.

## 2013-02-14 NOTE — Progress Notes (Addendum)
   CARE MANAGEMENT NOTE 02/14/2013  Patient:  Raymond Rangel, Raymond Rangel   Account Number:  [id number]  Date Initiated:  02/14/2013  Documentation initiated by:  [facility]  Subjective/Objective Assessment:   UTI, Acute delirium     Action/Plan:   lives at home with family, son Loraine Freid # [phone]   Anticipated DC Date:  02/15/2013   Anticipated DC Plan:  HOME W HOME HEALTH SERVICES      DC Planning Services  CM consult      [facility] Choice  HOME HEALTH  Resumption Of Svcs/PTA Provider   Choice offered to / List presented to:  C-3 Spouse           Status of service:  In process, will continue to follow Medicare Important Message given?   (If response is "NO", the following Medicare IM given date fields will be blank) Date Medicare IM given:   Date Additional Medicare IM given:    Discharge Disposition:    Per UR Regulation:    If discussed at Long Length of Stay Meetings, dates discussed:    Comments:  02/14/2013 1600 NCM attempted to speak with pt and he is not oriented. NCM spoke to son, [name]. And they plan to take him home post discharge. Pt lives at home with wife, and nephew. Dtr helps with care also.  Son states they had a very expensive nursing home bill in past. They have severall family members that assist with his care at home. They have wheelchair, rolling walker, bedside commode, and hospital  bed at home. Pt is active with [facility]. Isidoro Donning RN CCM Case Mgmt phone [phone]

## 2013-02-14 NOTE — Telephone Encounter (Signed)
Let Raymond Rangel know

## 2013-02-15 DIAGNOSIS — E785 Hyperlipidemia, unspecified: Secondary | ICD-10-CM

## 2013-02-15 DIAGNOSIS — I2510 Atherosclerotic heart disease of native coronary artery without angina pectoris: Secondary | ICD-10-CM

## 2013-02-15 DIAGNOSIS — E119 Type 2 diabetes mellitus without complications: Secondary | ICD-10-CM

## 2013-02-15 DIAGNOSIS — R5381 Other malaise: Secondary | ICD-10-CM

## 2013-02-15 LAB — CBC
MCH: 29.5 pg (ref 26.0–34.0)
MCV: 87.6 fL (ref 78.0–100.0)
Platelets: 235 10*3/uL (ref 150–400)
RBC: 4.27 MIL/uL (ref 4.22–5.81)
RDW: 15.9 % — ABNORMAL HIGH (ref 11.5–15.5)
WBC: 9.3 10*3/uL (ref 4.0–10.5)

## 2013-02-15 LAB — BASIC METABOLIC PANEL
CO2: 22 mEq/L (ref 19–32)
Calcium: 8.7 mg/dL (ref 8.4–10.5)
Creatinine, Ser: 0.78 mg/dL (ref 0.50–1.35)
GFR calc Af Amer: >90 mL/min (ref >90)
Potassium: 4.5 mEq/L (ref 3.5–5.1)
Sodium: 139 mEq/L (ref 135–145)

## 2013-02-15 LAB — URINE CULTURE

## 2013-02-15 LAB — GLUCOSE, CAPILLARY
Glucose-Capillary: 82 mg/dL (ref 70–99)
Glucose-Capillary: 107 mg/dL — ABNORMAL HIGH (ref 70–99)

## 2013-02-15 MED ORDER — POTASSIUM CHLORIDE 20 MEQ/15ML (10%) PO LIQD
40.0000 meq | Freq: Once | ORAL | Status: DC
  Filled 2013-02-15: qty 30

## 2013-02-15 MED ORDER — POTASSIUM CHLORIDE IN NACL 20-0.9 MEQ/L-% IV SOLN
INTRAVENOUS | Status: DC
  Administered 2013-02-15: 1 mL via INTRAVENOUS
  Administered 2013-02-16: 50 mL/h via INTRAVENOUS
  Filled 2013-02-15 (×6): qty 1000

## 2013-02-15 MED ORDER — POTASSIUM CHLORIDE CRYS ER 20 MEQ PO TBCR
30.0000 meq | EXTENDED_RELEASE_TABLET | Freq: Once | ORAL | Status: DC
  Filled 2013-02-15: qty 1

## 2013-02-15 MED ORDER — VITAMIN B-12 1000 MCG PO TABS
1000.0000 ug | ORAL_TABLET | Freq: Every day | ORAL | Status: DC
  Administered 2013-02-15 – 2013-02-18 (×4): 1000 ug via ORAL
  Filled 2013-02-15 (×4): qty 1

## 2013-02-15 NOTE — Progress Notes (Signed)
[facility] PROGRESS NOTE  Raymond Antis. ZOX:[id number] DOB: 19-Jun-1931 DOA: 02/13/2013 PCP: [name], MD  Assessment/Plan: 1-Acute toxic encephalopathy: due to UTI in patient with underlying dementia. -continue treatment for UTI -continue supportive care -B12 borderline low; will start PO repletion -PT recommending SNF; will follow with family as currently concerns for financial capacity to achieved that.  2-Gram neg rods UTI: waiting sensitivity. -continue rocephin for now -patient afebrile and with normal WBC's  3-Dementia: continue aricept  4-depression: continue seroquel  5-hx of CVA with right side hemiparesis: unchanged. Continue plavix  6-HLD: continue lipitor  7-diabetes: continue SSI  8-CAD: continue B-blocker and ASA. -no chest pain or SOB  9-physical deconditioning:PT recommending SNF; will continue treatment and follow dispo.  Code Status: Full Family Communication: wife at bedside Disposition Plan: to be determine   Consultants:  PT  Procedures:  See below for x-ray reports  Antibiotics:  Rocephin   HPI/Subjective: Afebrile, AAOX1; no acute complaints.  Objective: Filed Vitals:   02/15/13 0537  BP: 144/69  Pulse: 57  Temp: 97.6 F (36.4 C)  Resp: 16    Intake/Output Summary (Last 24 hours) at 02/15/13 1034 Last data filed at 02/15/13 0929  Gross per 24 hour  Intake    890 ml  Output    200 ml  Net    690 ml   Filed Weights   02/14/13 0900  Weight: 60.147 kg (132 lb 9.6 oz)    Exam:   General:  AAOX1, NAD, afebrile  Cardiovascular: S1 and S2, no rubs or gallops  Respiratory: CTA bilaterally  Abdomen: soft, NT, ND, positive BS  Musculoskeletal: no edema, cyanosis or clubbing  Data Reviewed: Basic Metabolic Panel:  Recent Labs Lab 02/13/13 1921 02/14/13 0500 02/15/13 0428  NA 137 140 139  K 3.2* 2.9* 4.5  CL 102 106 108  CO2 24 23 22   GLUCOSE 94 92 97  BUN 8 7 10   CREATININE 0.89 0.74 0.78  CALCIUM  9.1 8.5 8.7   Liver Function Tests:  Recent Labs Lab 02/13/13 1921 02/14/13 0500  AST 19 16  ALT 10 8  ALKPHOS 149* 126*  BILITOT 0.3 0.3  PROT 8.1 6.8  ALBUMIN 3.5 3.1*   CBC:  Recent Labs Lab 02/13/13 1921 02/14/13 0500 02/15/13 0428  WBC 9.9 7.2 9.3  NEUTROABS 5.8 4.2  --   HGB 10.9* 11.4* 12.6*  HCT 31.8* 32.8* 37.4*  MCV 86.4 84.8 87.6  PLT 297 214 235   CBG:  Recent Labs Lab 02/14/13 1100 02/14/13 1105 02/14/13 1607 02/14/13 2154 02/15/13 0633  GLUCAP 59* 82 80 90 82    Recent Results (from the past 240 hour(s))  URINE CULTURE     Status: None   Collection Time    02/13/13 10:11 PM      Result Value Range Status   Specimen Description URINE, RANDOM   Final   Special Requests NONE   Final   Culture  Setup Time     Final   Value: 02/14/2013 04:56     Performed at [facility] Count     Final   Value: >=100,000 COLONIES/ML     Performed at [facility]   Culture     Final   Value: GRAM NEGATIVE RODS     Performed at [facility]   Report Status PENDING   Incomplete     Studies: Ct Head Wo Contrast  02/14/2013   CLINICAL DATA:  81-year- male  with altered mental status. Agitated. Dementia. Initial encounter.  EXAM: CT HEAD WITHOUT CONTRAST  TECHNIQUE: Contiguous axial images were obtained from the base of the skull through the vertex without intravenous contrast.  COMPARISON:  Brain MRI 11/24/2012 and earlier.  FINDINGS: Study is intermittently degraded by motion artifact despite repeated imaging attempts.  Chronic left maxillary sinus mucosal thickening. Other sinuses appear clear.  No acute osseous abnormality identified. Visualized orbits and scalp soft tissues are within normal limits.  Stable cerebral volume. Stable ventricles. Chronic lacunar infarct in the left thalamus re- identified. Chronic left occipital pole infarct unchanged. No midline shift, mass effect, or evidence of intracranial mass lesion. No acute  intracranial hemorrhage identified. No evidence of cortically based acute infarction identified. No suspicious intracranial vascular hyperdensity. Calcified atherosclerosis at the skull base.  IMPRESSION: Stable chronic small and medium-sized vessel ischemia. No acute intracranial abnormality.   Electronically Signed   By: Augusto Gamble M.D.   On: 02/14/2013 00:46   Dg Chest Port 1 View  02/13/2013   CLINICAL DATA:  Altered mental status, history of strokes  EXAM: PORTABLE CHEST - 1 VIEW  COMPARISON:  11/24/2012  FINDINGS: Heart size and vascular pattern normal. Bilateral nipple shadows identified. Lungs clear. Elevated bilateral humeral heads with degenerative changes stable.  IMPRESSION: No active disease.   Electronically Signed   By: Esperanza Heir M.D.   On: 02/13/2013 20:22    Scheduled Meds: . aspirin  81 mg Oral Daily  . atorvastatin  40 mg Oral q1800  . baclofen  10 mg Oral TID  . cefTRIAXone (ROCEPHIN)  IV  1 g Intravenous Q24H  . clopidogrel  75 mg Oral Q breakfast  . donepezil  5 mg Oral QHS  . feeding supplement (RESOURCE BREEZE)  1 Container Oral TID BM  . insulin aspart  0-9 Units Subcutaneous TID WC  . metoprolol tartrate  25 mg Oral BID  . QUEtiapine  50 mg Oral BID   Continuous Infusions:   Principal Problem:   Acute encephalopathy Active Problems:   CAD (coronary artery disease)   Dementia   CVA (cerebral infarction)   Urinary tract infection   Anemia    Time spent: >30 minutes    Raymond Rangel  [facility] Pager [phone]. If 7PM-7AM, please contact night-coverage at [email or web address], password [employer] 02/15/2013, 10:34 AM  LOS: 2 days

## 2013-02-16 DIAGNOSIS — E46 Unspecified protein-calorie malnutrition: Secondary | ICD-10-CM

## 2013-02-16 LAB — GLUCOSE, CAPILLARY
Glucose-Capillary: 78 mg/dL (ref 70–99)
Glucose-Capillary: 106 mg/dL — ABNORMAL HIGH (ref 70–99)
Glucose-Capillary: 85 mg/dL (ref 70–99)

## 2013-02-16 MED ORDER — WHITE PETROLATUM GEL
Status: AC
  Administered 2013-02-16: 23:00:00
  Filled 2013-02-16: qty 5

## 2013-02-16 NOTE — Progress Notes (Signed)
[facility] PROGRESS NOTE  Raymond Rangel. ZOX:[id number] DOB: 07/23/1931 DOA: 02/13/2013 PCP: [name], MD  Assessment/Plan: 1-Acute toxic encephalopathy: due to klebsiella UTI in patient with underlying dementia. -continue treatment for UTI with IV antibiotics while inpatient -continue supportive care -B12 borderline low; started on PO repletion -PT recommending SNF and after discussed with family they are in agreement for placement. -SW consulted and patient stable for discharge when bed available  2-Klebsiella UTI: sensitivity to rocephin. -continue rocephin for now while inpatient -patient afebrile and with normal WBC's -will switch to ceftin BID at discharge (needs 10 days of abx's treatment)  3-Dementia: continue aricept  4-depression: continue seroquel  5-hx of CVA with right side hemiparesis: unchanged. Continue plavix  6-HLD: continue lipitor  7-diabetes: continue SSI  8-CAD: continue B-blocker and ASA. -no chest pain or SOB  9-physical deconditioning: PT recommending SNF; will continue treatment and follow dispo.  10-moderate protein calorie malnutrition: continue resource TID -nutritional service on board  Code Status: Full Family Communication: son by phone Disposition Plan: SNF at discharge   Consultants:  PT  Procedures:  See below for x-ray reports  Antibiotics:  Rocephin   HPI/Subjective: Afebrile, AAOX1; no acute complaints. Mentation at baseline as per family reports  Objective: Filed Vitals:   02/16/13 0558  BP: 128/73  Pulse: 61  Temp: 98 F (36.7 C)  Resp: 18    Intake/Output Summary (Last 24 hours) at 02/16/13 1416 Last data filed at 02/16/13 0800  Gross per 24 hour  Intake    600 ml  Output      0 ml  Net    600 ml   Filed Weights   02/14/13 0900  Weight: 60.147 kg (132 lb 9.6 oz)    Exam:   General:  AAOX1, NAD, afebrile  Cardiovascular: S1 and S2, no rubs or gallops  Respiratory: CTA  bilaterally  Abdomen: soft, NT, ND, positive BS  Musculoskeletal: no edema, cyanosis or clubbing  Data Reviewed: Basic Metabolic Panel:  Recent Labs Lab 02/13/13 1921 02/14/13 0500 02/15/13 0428  NA 137 140 139  K 3.2* 2.9* 4.5  CL 102 106 108  CO2 24 23 22   GLUCOSE 94 92 97  BUN 8 7 10   CREATININE 0.89 0.74 0.78  CALCIUM 9.1 8.5 8.7   Liver Function Tests:  Recent Labs Lab 02/13/13 1921 02/14/13 0500  AST 19 16  ALT 10 8  ALKPHOS 149* 126*  BILITOT 0.3 0.3  PROT 8.1 6.8  ALBUMIN 3.5 3.1*   CBC:  Recent Labs Lab 02/13/13 1921 02/14/13 0500 02/15/13 0428  WBC 9.9 7.2 9.3  NEUTROABS 5.8 4.2  --   HGB 10.9* 11.4* 12.6*  HCT 31.8* 32.8* 37.4*  MCV 86.4 84.8 87.6  PLT 297 214 235   CBG:  Recent Labs Lab 02/15/13 1133 02/15/13 1626 02/15/13 2145 02/16/13 0645 02/16/13 1057  GLUCAP 107* 114* 110* 78 106*    Recent Results (from the past 240 hour(s))  URINE CULTURE     Status: None   Collection Time    02/13/13 10:11 PM      Result Value Range Status   Specimen Description URINE, RANDOM   Final   Special Requests NONE   Final   Culture  Setup Time     Final   Value: 02/14/2013 04:56     Performed at [facility] Count     Final   Value: >=100,000 COLONIES/ML     Performed at [facility]   Culture     Final   Value: KLEBSIELLA PNEUMONIAE     Performed at [facility]   Report Status 02/15/2013 FINAL   Final   Organism ID, Bacteria KLEBSIELLA PNEUMONIAE   Final     Studies: No results found.  Scheduled Meds: . aspirin  81 mg Oral Daily  . atorvastatin  40 mg Oral q1800  . baclofen  10 mg Oral TID  . cefTRIAXone (ROCEPHIN)  IV  1 g Intravenous Q24H  . clopidogrel  75 mg Oral Q breakfast  . donepezil  5 mg Oral QHS  . feeding supplement (RESOURCE BREEZE)  1 Container Oral TID BM  . insulin aspart  0-9 Units Subcutaneous TID WC  . metoprolol tartrate  25 mg Oral BID  . QUEtiapine  50 mg Oral BID  .  vitamin B-12  1,000 mcg Oral Daily   Continuous Infusions: . 0.9 % NaCl with KCl 20 mEq / L 1 mL (02/15/13 1141)    Principal Problem:   Acute encephalopathy Active Problems:   CAD (coronary artery disease)   Dementia   CVA (cerebral infarction)   Urinary tract infection   Anemia    Time spent: >30 minutes    Raymond Rangel  [facility] Pager [phone]. If 7PM-7AM, please contact night-coverage at [email or web address], password [facility] 02/16/2013, 2:16 PM  LOS: 3 days

## 2013-02-17 DIAGNOSIS — E43 Unspecified severe protein-calorie malnutrition: Secondary | ICD-10-CM

## 2013-02-17 LAB — GLUCOSE, CAPILLARY
Glucose-Capillary: 95 mg/dL (ref 70–99)
Glucose-Capillary: 104 mg/dL — ABNORMAL HIGH (ref 70–99)
Glucose-Capillary: 96 mg/dL (ref 70–99)

## 2013-02-17 NOTE — Progress Notes (Signed)
Clinical Social Work Department BRIEF PSYCHOSOCIAL ASSESSMENT 02/17/2013  Patient:  Raymond Rangel, Raymond Rangel     Account Number:  [id number]     Admit date:  02/13/2013  Clinical Social Worker:  Hadley Pen  Date/Time:  02/17/2013 03:53 PM  Referred by:  Physician  Date Referred:  02/17/2013 Referred for  SNF Placement   Other Referral:   Interview type:  Family Other interview type:   CSW spoke with patient's son Raymond Rangel [phone] via telephone    PSYCHOSOCIAL DATA Living Status:  SIGNIFICANT OTHER Admitted from facility:   Level of care:   Primary support name:  Raymond Rangel [phone] Primary support relationship to patient:  SPOUSE Degree of support available:   Good    CURRENT CONCERNS Current Concerns  Post-Acute Placement   Other Concerns:    SOCIAL WORK ASSESSMENT / PLAN Weekend CSW received referal for SNF placement. CSW contacted patient's wife via telephone, who then directed CSW to speak to son Raymond Rangel [phone]. CSW contacted son Raymond Rangel to discuss SNF. Son was agreeable, stated that his father has been to [facility] before. Son Raymond Rangel would prefer another facility but is agreeable to [facility] as a back up. Weekend CSW will fax out patient information for bed offers. Weekday CSW to follow for discharge planning.   Assessment/plan status:  Information/Referral to [facility] Other assessment/ plan:   Information/referral to community resources:    PATIENT'S/FAMILY'S RESPONSE TO PLAN OF CARE: Son Raymond Rangel is agreeable to SNF placement, preferably NOT [facility] as first choice.    Samuella Bruin, MSW, LCSWA Clinical Social Worker [facility]. [phone]

## 2013-02-17 NOTE — Progress Notes (Addendum)
Clinical Social Work Department CLINICAL SOCIAL WORK PLACEMENT NOTE 02/17/2013  Patient:  Raymond Rangel, Raymond Rangel  Account Number:  [id number] Admit date:  02/13/2013  Clinical Social Worker:  Samuella Bruin, Theresia Majors  Date/time:  02/17/2013 04:34 PM  Clinical Social Work is seeking post-discharge placement for this patient at the following level of care:   SKILLED NURSING   (*CSW will update this form in Epic as items are completed)     Patient/family provided with [facility] Department of Clinical Social Work's list of facilities offering this level of care within the geographic area requested by the patient (or if unable, by the patient's family).  02/17/2013  Patient/family informed of their freedom to choose among providers that offer the needed level of care, that participate in Medicare, Medicaid or managed care program needed by the patient, have an available bed and are willing to accept the patient.    Patient/family informed of [facility]' ownership interest in [facility], as well as of the fact that they are under no obligation to receive care at this facility.  PASARR submitted to EDS on  PASARR number received from EDS on   FL2 transmitted to all facilities in geographic area requested by pt/family on  02/17/2013 FL2 transmitted to all facilities within larger geographic area on   Patient informed that his/her managed care company has contracts with or will negotiate with  certain facilities, including the following:     Patient/family informed of bed offers received: 02/18/13  Patient chooses bed at [facility] Physician recommends and patient chooses bed at    Patient to be transferred to [facility] on 02/18/13   Patient to be transferred to facility by ambulance  The following physician request were entered in Epic:   Additional Comments:  Samuella Bruin, MSW, LCSWA Clinical Social Worker [facility] Emergency Dept. [phone]

## 2013-02-17 NOTE — Progress Notes (Signed)
[facility] PROGRESS NOTE  Raymond Rangel. ZOX:[id number] DOB: 08/20/1931 DOA: 02/13/2013 PCP: [name], MD  Assessment/Plan: 1-Acute toxic encephalopathy: due to klebsiella UTI in patient with underlying dementia. -continue treatment for UTI with IV antibiotics while inpatient -continue supportive care -B12 borderline low; started on PO repletion -PT recommending SNF and after discussed with family they are in agreement for placement. -SW consulted and patient stable for discharge when bed available  2-Klebsiella UTI: sensitivity to rocephin. -continue rocephin for now while inpatient -patient afebrile and with normal WBC's -will switch to ceftin BID at discharge (needs 10 days of abx's treatment)  3-Dementia: continue aricept  4-depression: continue seroquel  5-hx of CVA with right side hemiparesis: unchanged. Continue plavix  6-HLD: continue lipitor  7-diabetes: continue SSI  8-CAD: continue B-blocker and ASA. -no chest pain or SOB  9-physical deconditioning: PT recommending SNF; will continue treatment and follow dispo.  10-moderate protein calorie malnutrition: continue resource TID -nutritional service on board  Code Status: Full Family Communication: son by phone Disposition Plan: SNF at discharge   Consultants:  PT  Procedures:  See below for x-ray reports  Antibiotics:  Rocephin   HPI/Subjective: Afebrile, AAOX1; no acute complaints. Mentation at baseline as per family reports. Waiting to bed availability for discharge to SNF.  Objective: Filed Vitals:   02/17/13 1532  BP: 114/62  Pulse: 62  Temp: 98.2 F (36.8 C)  Resp: 18    Intake/Output Summary (Last 24 hours) at 02/17/13 1658 Last data filed at 02/17/13 0809  Gross per 24 hour  Intake    530 ml  Output    450 ml  Net     80 ml   Filed Weights   02/14/13 0900  Weight: 60.147 kg (132 lb 9.6 oz)    Exam:   General:  AAOX1, NAD, afebrile  Cardiovascular: S1 and S2, no  rubs or gallops  Respiratory: CTA bilaterally  Abdomen: soft, NT, ND, positive BS  Musculoskeletal: no edema, cyanosis or clubbing  Data Reviewed: Basic Metabolic Panel:  Recent Labs Lab 02/13/13 1921 02/14/13 0500 02/15/13 0428  NA 137 140 139  K 3.2* 2.9* 4.5  CL 102 106 108  CO2 24 23 22   GLUCOSE 94 92 97  BUN 8 7 10   CREATININE 0.89 0.74 0.78  CALCIUM 9.1 8.5 8.7   Liver Function Tests:  Recent Labs Lab 02/13/13 1921 02/14/13 0500  AST 19 16  ALT 10 8  ALKPHOS 149* 126*  BILITOT 0.3 0.3  PROT 8.1 6.8  ALBUMIN 3.5 3.1*   CBC:  Recent Labs Lab 02/13/13 1921 02/14/13 0500 02/15/13 0428  WBC 9.9 7.2 9.3  NEUTROABS 5.8 4.2  --   HGB 10.9* 11.4* 12.6*  HCT 31.8* 32.8* 37.4*  MCV 86.4 84.8 87.6  PLT 297 214 235   CBG:  Recent Labs Lab 02/16/13 1623 02/16/13 2123 02/17/13 0637 02/17/13 1228 02/17/13 1625  GLUCAP 85 82 95 102* 104*    Recent Results (from the past 240 hour(s))  URINE CULTURE     Status: None   Collection Time    02/13/13 10:11 PM      Result Value Range Status   Specimen Description URINE, RANDOM   Final   Special Requests NONE   Final   Culture  Setup Time     Final   Value: 02/14/2013 04:56     Performed at [facility] Count     Final   Value: >=100,000 COLONIES/ML  Performed at [facility]     Final   Value: KLEBSIELLA PNEUMONIAE     Performed at [facility]   Report Status 02/15/2013 FINAL   Final   Organism ID, Bacteria KLEBSIELLA PNEUMONIAE   Final     Studies: No results found.  Scheduled Meds: . aspirin  81 mg Oral Daily  . atorvastatin  40 mg Oral q1800  . baclofen  10 mg Oral TID  . cefTRIAXone (ROCEPHIN)  IV  1 g Intravenous Q24H  . clopidogrel  75 mg Oral Q breakfast  . donepezil  5 mg Oral QHS  . feeding supplement (RESOURCE BREEZE)  1 Container Oral TID BM  . insulin aspart  0-9 Units Subcutaneous TID WC  . metoprolol tartrate  25 mg Oral BID  .  QUEtiapine  50 mg Oral BID  . vitamin B-12  1,000 mcg Oral Daily   Continuous Infusions: . 0.9 % NaCl with KCl 20 mEq / L 50 mL/hr (02/16/13 2231)    Principal Problem:   Acute encephalopathy Active Problems:   CAD (coronary artery disease)   Dementia   CVA (cerebral infarction)   Urinary tract infection   Anemia    Time spent: >30 minutes    Alpha Mysliwiec  [facility] Pager [phone]. If 7PM-7AM, please contact night-coverage at [email or web address], password [facility] 02/17/2013, 4:58 PM  LOS: 4 days

## 2013-02-18 LAB — GLUCOSE, CAPILLARY
Glucose-Capillary: 99 mg/dL (ref 70–99)
Glucose-Capillary: 145 mg/dL — ABNORMAL HIGH (ref 70–99)

## 2013-02-18 MED ORDER — ENSURE COMPLETE PO LIQD: 237.0000 mL | Freq: Three times a day (TID) | ORAL | Status: DC

## 2013-02-18 MED ORDER — CYANOCOBALAMIN 1000 MCG PO TABS: 1000.0000 ug | ORAL_TABLET | Freq: Every day | ORAL | Status: DC

## 2013-02-18 MED ORDER — CEFUROXIME AXETIL 500 MG PO TABS: 500.0000 mg | ORAL_TABLET | Freq: Two times a day (BID) | ORAL | Status: AC

## 2013-02-18 NOTE — Progress Notes (Signed)
   CARE MANAGEMENT NOTE 02/18/2013  Patient:  Raymond Rangel, Raymond Rangel   Account Number:  [id number]  Date Initiated:  02/14/2013  Documentation initiated by:  [facility]  Subjective/Objective Assessment:   UTI, Acute delirium     Action/Plan:   lives at home with family, son Trevionne Advani # [phone]   Anticipated DC Date:  02/15/2013   Anticipated DC Plan:  HOME W HOME HEALTH SERVICES  In-house referral  Clinical Social Worker      DC Planning Services  CM consult      [facility] Choice  HOME HEALTH  Resumption Of Svcs/PTA Provider   Choice offered to / List presented to:  C-4 Adult Children           Status of service:  Completed, signed off Medicare Important Message given?   (If response is "NO", the following Medicare IM given date fields will be blank) Date Medicare IM given:   Date Additional Medicare IM given:    Discharge Disposition:  SKILLED NURSING FACILITY  Per UR Regulation:    If discussed at Long Length of Stay Meetings, dates discussed:    Comments:  02/18/2013 1145 Scheduled dc to SNF when medically stable. No additional NCM needs identified.  Isidoro Donning RN CCM Case Mgmt phone [phone]  02/14/2013 1600 NCM attempted to speak with pt and he is not oriented. NCM spoke to son, [name]. And they plan to take him home post discharge. Pt lives at home with wife, and nephew. Dtr helps with care also.  Son states they had a very expensive nursing home bill in past. They have severall family members that assist with his care at home. They have wheelchair, rolling walker, bedside commode, and hospital  bed at home. Pt is active with [facility].  Isidoro Donning RN CCM Case Mgmt phone [phone]

## 2013-02-18 NOTE — Discharge Summary (Signed)
Physician Discharge Summary  Raymond Rangel. ZOX:[id number] DOB: [birth year] DOA: 02/13/2013  PCP: [name], MD  Admit date: 02/13/2013 Discharge date: 02/18/2013  Time spent: >30 minutes  Recommendations for Outpatient Follow-up:  1. Reassess blood pressure and adjust medications as needed 2. Basic metabolic panel to check electrolytes and renal function 3. Repeat B12 level in 12 weeks to follow response to oral repletion 4. Check hemoglobin A1c and follow CBGs logs to determine needs of long-term hypoglycemic regiments.  Discharge Diagnoses:  Principal Problem:   Acute encephalopathy Active Problems:   CAD (coronary artery disease)   Dementia   CVA (cerebral infarction)   Urinary tract infection   Anemia  borderline low B12 level  Discharge Condition: Pleasantly confused, stable and improved. Will discharge to nursing facility for physical rehabilitation, supportive care and assistance. Patient will finish antibiotics as instructed and will follow with primary care physician in one week.  Diet recommendation: Modified carbohydrates diet  Filed Weights   02/14/13 0900  Weight: 60.147 kg (132 lb 9.6 oz)    History of present illness:  79 y.o. male with known history of dementia, CVA with right-sided hemiparesis, CAD was brought to the ER after patient's son found that the patient was cleaning increasingly confused over the last 2 days. Patient otherwise, as per the son did not have any nausea vomiting abdominal pain diarrhea chest pain shortness of breath fever chills. In the ER patient was found to be confused. Afebrile. UA shows features of UTI and has been admitted for acute encephalopathy most likely secondary to UTI.    Hospital Course:  1-Acute toxic encephalopathy: due to klebsiella UTI in a patient with underlying dementia.  -continue treatment for UTI with IV antibiotics while inpatient  -continue supportive care  -B12 borderline low; started on PO repletion  -PT  recommending SNF and after discussed with family they are in agreement for placement.  -SW consulted and patient stable for discharge when bed available   2-Klebsiella UTI: sensitivity to rocephin.  -Receive rocephin for 4 days now while inpatient  -patient afebrile and with normal WBC's  -will switch to ceftin BID at discharge (to complete a total of  10 days of abx's treatment)   3-Dementia: continue aricept   4-depression: continue seroquel   5-hx of CVA with right side hemiparesis: unchanged. Continue plavix   6-HLD: continue lipitor   7-diabetes: Currently not taking any medications in outpatient setting. Latest A1c 6.7 -Given patient decrease in PO intake and in order to avoid hypoglycemia, plan is to follow his blood sugar uncontrolled diabetes with diet. -CBGs well controlled during hospitalization with the use of sliding scale insulin. -Patient will follow with PCP and for the decision for long-term hypoglycemic regimen will be decided.  8-CAD: continue B-blocker, statins and ASA.  -no chest pain or SOB   9-physical deconditioning: PT recommending SNF. -family in agreement with plan; will discharge to nursing facility for rehabilitation, supportive care and assistance.  10-Moderate protein calorie malnutrition: continue ensure TID  -encourage patient to increase PO intake   Procedures: See below for x-ray reports  Consultations:  PT/OT  Discharge Exam: Filed Vitals:   02/18/13 0620  BP: 170/89  Pulse: 91  Temp: 98.2 F (36.8 C)  Resp: 20   General: pleasantly confused; AAOX1, NAD, afebrile  Cardiovascular: S1 and S2, no rubs or gallops  Respiratory: CTA bilaterally  Abdomen: soft, NT, ND, positive BS  Musculoskeletal: no edema, cyanosis or clubbing   Discharge Instructions  Discharge Orders  Future Orders Complete By Expires   Discharge instructions  As directed    Comments:     Take medication as prescribed Keep patient well hydrated next line  arrange followup with primary care physician in 7-10 days. Encourage by mouth intake and use feeding supplements as instructed.       Medication List         aspirin 81 MG chewable tablet  Chew 1 tablet (81 mg total) by mouth daily.     atorvastatin 40 MG tablet  Commonly known as:  LIPITOR  Take 1 tablet (40 mg total) by mouth daily.     baclofen 10 MG tablet  Commonly known as:  LIORESAL  Take 10 mg by mouth 3 (three) times daily.     cefUROXime 500 MG tablet  Commonly known as:  CEFTIN  Take 1 tablet (500 mg total) by mouth 2 (two) times daily with a meal. 1 tablet by mouth twice a day for the next 6 days (last day for antibiotics 02/24/2013)     clopidogrel 75 MG tablet  Commonly known as:  PLAVIX  Take 75 mg by mouth daily.     cyanocobalamin 1000 MCG tablet  Take 1 tablet (1,000 mcg total) by mouth daily.     donepezil 5 MG tablet  Commonly known as:  ARICEPT  Take 1 tablet (5 mg total) by mouth at bedtime.     feeding supplement (ENSURE COMPLETE) Liqd  Take 237 mLs by mouth 3 (three) times daily between meals.     metoprolol tartrate 25 MG tablet  Commonly known as:  LOPRESSOR  Take 25 mg by mouth 2 (two) times daily.     QUEtiapine 50 MG tablet  Commonly known as:  SEROQUEL  Take 1 tablet (50 mg total) by mouth 2 (two) times daily.       No Known Allergies     Follow-up Information   Follow up with [name], MD. Schedule an appointment as soon as possible for a visit in 1 week.   Specialty:  Internal Medicine   Contact information:   [phone] [address] [facility] [address] [place] [address] [phone]        The results of significant diagnostics from this hospitalization (including imaging, microbiology, ancillary and laboratory) are listed below for reference.    Significant Diagnostic Studies: Ct Head Wo Contrast  02/14/2013   CLINICAL DATA:  81-year- male with altered mental status. Agitated. Dementia. Initial encounter.  EXAM: CT  HEAD WITHOUT CONTRAST  TECHNIQUE: Contiguous axial images were obtained from the base of the skull through the vertex without intravenous contrast.  COMPARISON:  Brain MRI 11/24/2012 and earlier.  FINDINGS: Study is intermittently degraded by motion artifact despite repeated imaging attempts.  Chronic left maxillary sinus mucosal thickening. Other sinuses appear clear.  No acute osseous abnormality identified. Visualized orbits and scalp soft tissues are within normal limits.  Stable cerebral volume. Stable ventricles. Chronic lacunar infarct in the left thalamus re- identified. Chronic left occipital pole infarct unchanged. No midline shift, mass effect, or evidence of intracranial mass lesion. No acute intracranial hemorrhage identified. No evidence of cortically based acute infarction identified. No suspicious intracranial vascular hyperdensity. Calcified atherosclerosis at the skull base.  IMPRESSION: Stable chronic small and medium-sized vessel ischemia. No acute intracranial abnormality.   Electronically Signed   By: Augusto Gamble M.D.   On: 02/14/2013 00:46   Dg Chest Port 1 View  02/13/2013   CLINICAL DATA:  Altered  mental status, history of strokes  EXAM: PORTABLE CHEST - 1 VIEW  COMPARISON:  11/24/2012  FINDINGS: Heart size and vascular pattern normal. Bilateral nipple shadows identified. Lungs clear. Elevated bilateral humeral heads with degenerative changes stable.  IMPRESSION: No active disease.   Electronically Signed   By: Esperanza Heir M.D.   On: 02/13/2013 20:22    Microbiology: Recent Results (from the past 240 hour(s))  URINE CULTURE     Status: None   Collection Time    02/13/13 10:11 PM      Result Value Range Status   Specimen Description URINE, RANDOM   Final   Special Requests NONE   Final   Culture  Setup Time     Final   Value: 02/14/2013 04:56     Performed at [facility] Count     Final   Value: >=100,000 COLONIES/ML     Performed at [facility]    Culture     Final   Value: KLEBSIELLA PNEUMONIAE     Performed at [facility]   Report Status 02/15/2013 FINAL   Final   Organism ID, Bacteria KLEBSIELLA PNEUMONIAE   Final     Labs: Basic Metabolic Panel:  Recent Labs Lab 02/13/13 1921 02/14/13 0500 02/15/13 0428  NA 137 140 139  K 3.2* 2.9* 4.5  CL 102 106 108  CO2 24 23 22   GLUCOSE 94 92 97  BUN 8 7 10   CREATININE 0.89 0.74 0.78  CALCIUM 9.1 8.5 8.7   Liver Function Tests:  Recent Labs Lab 02/13/13 1921 02/14/13 0500  AST 19 16  ALT 10 8  ALKPHOS 149* 126*  BILITOT 0.3 0.3  PROT 8.1 6.8  ALBUMIN 3.5 3.1*   CBC:  Recent Labs Lab 02/13/13 1921 02/14/13 0500 02/15/13 0428  WBC 9.9 7.2 9.3  NEUTROABS 5.8 4.2  --   HGB 10.9* 11.4* 12.6*  HCT 31.8* 32.8* 37.4*  MCV 86.4 84.8 87.6  PLT 297 214 235   CBG:  Recent Labs Lab 02/17/13 1228 02/17/13 1625 02/17/13 2135 02/18/13 0618 02/18/13 1119  GLUCAP 102* 104* 96 99 145*    Signed:  Elorah Dewing  [facility] 02/18/2013, 12:50 PM

## 2013-02-18 NOTE — Progress Notes (Signed)
Physical Therapy Treatment Patient Details Name: Raymond Rangel. MRN: [id number] DOB: November 15, 1931 Today's Date: 02/18/2013 Time: 4098-1191 PT Time Calculation (min): 25 min  PT Assessment / Plan / Recommendation  History of Present Illness 79 y.o. male with history of dementia, previous stroke with right-sided weakness, hyperlipidemia was brought to the ER after patient's family found that patient has been increasingly confused. Found to have UTI   PT Comments   Pt is showing improvement towards physical therapy goals. Confusion and difficulty following commands was limiting factor during session, however pt appears to be improving with functional mobility this session. Pt was able to perform a stand pivot transfer with less assistance this session. During static standing, pt appeared to fatigue quickly and was not able to maintain an upright posture for >1 minute.   Follow Up Recommendations  SNF;Supervision/Assistance - 24 hour     Does the patient have the potential to tolerate intense rehabilitation     Barriers to Discharge        Equipment Recommendations  None recommended by PT    Recommendations for Other Services    Frequency Min 2X/week   Progress towards PT Goals Progress towards PT goals: Progressing toward goals  Plan Current plan remains appropriate    Precautions / Restrictions Precautions Precautions: Fall Precaution Comments: h/o falls in the home with wife during transfers Restrictions Weight Bearing Restrictions: No   Pertinent Vitals/Pain Pt did not report any pain throughout session.    Mobility  Bed Mobility Bed Mobility: Supine to Sit;Sitting - Scoot to Edge of Bed Supine to Sit: 1: +2 Total assist;HOB elevated;With rails Supine to Sit: Patient Percentage: 40% Sitting - Scoot to Edge of Bed: 3: Mod assist Details for Bed Mobility Assistance: VC's for sequencing and technique to transition to EOB. Pt had difficulty following commands to reach for the  bed rails for support, and tactile cueing was necessary for pt to open his hand in order to grab and hold the bed rail. Transfers Transfers: Sit to Stand;Stand to Sit;Stand Pivot Transfers Sit to Stand: 1: +2 Total assist;From chair/3-in-1;With upper extremity assist Sit to Stand: Patient Percentage: 50% Stand to Sit: 3: Mod assist;To chair/3-in-1;With upper extremity assist Stand Pivot Transfers: 1: +2 Total assist Stand Pivot Transfers: Patient Percentage: 50% Details for Transfer Assistance: x3 transfers to standing throughout session. Pt requires blocking of knees and feet as he extends the legs as he is trying to stand up. With blocking, pt was able to stand with +2 assist for trunk support as well as to place hands on walker and assist him to open the hand to grip the walker on the right side.  Ambulation/Gait Ambulation/Gait Assistance: Not tested (comment)    Exercises General Exercises - Lower Extremity Long Arc Quad: 10 reps Hip ABduction/ADduction: 10 reps   PT Diagnosis:    PT Problem List:   PT Treatment Interventions:     PT Goals (current goals can now be found in the care plan section) Acute Rehab PT Goals Patient Stated Goal: To get out of bed better PT Goal Formulation: With patient/family Time For Goal Achievement: 02/21/13 Potential to Achieve Goals: Fair  Visit Information  Last PT Received On: 02/18/13 Assistance Needed: +2 History of Present Illness: 79 y.o. male with history of dementia, previous stroke with right-sided weakness, hyperlipidemia was brought to the ER after patient's family found that patient has been increasingly confused. Found to have UTI    Subjective Data  Subjective: "I'm doing fine ma'am,  and how are you today?" Patient Stated Goal: To get out of bed better   Cognition  Cognition Arousal/Alertness: Awake/alert Behavior During Therapy: WFL for tasks assessed/performed Overall Cognitive Status: Impaired/Different from baseline (long  standing cognitive deficits from CVA and dementia) Area of Impairment: Attention;Following commands Current Attention Level: Sustained Memory: Decreased short-term memory Following Commands: Follows one step commands inconsistently General Comments: Pt requires repeated, simple commands to carry through with task    Balance  Balance Balance Assessed: Yes Static Sitting Balance Static Sitting - Balance Support: Bilateral upper extremity supported Static Sitting - Level of Assistance: 5: Stand by assistance Static Sitting - Comment/# of Minutes: 5 minutes EOB Static Standing Balance Static Standing - Balance Support: Bilateral upper extremity supported Static Standing - Level of Assistance: 1: +2 Total assist;Patient percentage (comment) (40%) Static Standing - Comment/# of Minutes: Pt standing while getting cleaned up by NT. VC's for improved posture and pt was leaning heavily to the right for support.  End of Session PT - End of Session Equipment Utilized During Treatment: Gait belt Activity Tolerance: Patient tolerated treatment well (Limited by confusion) Patient left: in chair;with call bell/phone within reach Nurse Communication: Mobility status   GP     Ruthann Cancer 02/18/2013, 1:24 PM  Ruthann Cancer, PT, DPT [phone]

## 2013-02-19 ENCOUNTER — Encounter: Payer: Self-pay | Admitting: Internal Medicine

## 2013-03-22 ENCOUNTER — Encounter: Payer: Self-pay | Admitting: Internal Medicine

## 2013-03-29 ENCOUNTER — Encounter: Payer: Self-pay | Admitting: Internal Medicine

## 2013-03-29 ENCOUNTER — Ambulatory Visit (INDEPENDENT_AMBULATORY_CARE_PROVIDER_SITE_OTHER): Payer: Medicare Other | Admitting: Internal Medicine

## 2013-03-29 VITALS — BP 129/80 | HR 77 | Temp 98.0°F

## 2013-03-29 DIAGNOSIS — E538 Deficiency of other specified B group vitamins: Secondary | ICD-10-CM

## 2013-03-29 DIAGNOSIS — F0390 Unspecified dementia without behavioral disturbance: Secondary | ICD-10-CM

## 2013-03-29 DIAGNOSIS — E119 Type 2 diabetes mellitus without complications: Secondary | ICD-10-CM

## 2013-03-29 DIAGNOSIS — N390 Urinary tract infection, site not specified: Secondary | ICD-10-CM

## 2013-03-29 LAB — HEMOGLOBIN A1C: Hgb A1c MFr Bld: 6.3 % — ABNORMAL HIGH (ref <5.7)

## 2013-03-29 MED ORDER — QUETIAPINE FUMARATE 50 MG PO TABS: 50.0000 mg | ORAL_TABLET | Freq: Three times a day (TID) | ORAL | Status: DC

## 2013-03-29 NOTE — Progress Notes (Signed)
Pre visit review using our clinic review tool, if applicable. No additional management support is needed unless otherwise documented below in the visit note. 

## 2013-03-29 NOTE — Assessment & Plan Note (Addendum)
Recently admitted with mental status changes believed to be encephalopathy, since then but wife noted the patient is a little more erratic and aggressive. Plan: Increase Seroquel from 50 mg twice a day to 50 mg 3 times a day, consider benzos as caregiver is "reaching her limit"
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
## 2013-03-29 NOTE — Assessment & Plan Note (Addendum)
Mild B12 deficiency, not taking supplements, encouraged wife to provide supplements qd, labs on return to the office

## 2013-03-29 NOTE — Assessment & Plan Note (Addendum)
Has been taking his CBGs regularly and they're  Reportedly  normal, will check a A1c

## 2013-03-29 NOTE — Assessment & Plan Note (Signed)
Clinically resolved.  

## 2013-03-29 NOTE — Patient Instructions (Signed)
Get your blood work before you leave  Next visit for a follow up (30 minutes)    no fasting, in 2 months  Please make an appointment

## 2013-04-01 ENCOUNTER — Telehealth: Payer: Self-pay | Admitting: *Deleted

## 2013-04-01 DIAGNOSIS — M6281 Muscle weakness (generalized): Secondary | ICD-10-CM

## 2013-04-01 DIAGNOSIS — I672 Cerebral atherosclerosis: Secondary | ICD-10-CM

## 2013-04-01 DIAGNOSIS — N390 Urinary tract infection, site not specified: Secondary | ICD-10-CM

## 2013-04-01 DIAGNOSIS — F0390 Unspecified dementia without behavioral disturbance: Secondary | ICD-10-CM

## 2013-04-01 NOTE — Telephone Encounter (Signed)
04/01/2013  Received fax from [facility] of Home Health Certification and POC.  Attached Billing Form and put in [place] folder.  bw

## 2013-04-08 ENCOUNTER — Telehealth: Payer: Self-pay | Admitting: *Deleted

## 2013-04-08 NOTE — Telephone Encounter (Signed)
OT from [facility] called and stated that patient may have another UTI. He had me speak with the patient's wife.  Spoke with patients wife and she states that he is combative, confused and lethargic at times. She stated she thinks he may have an UTI.  Nurse from [facility] is going to collect urine and test for UTI. Will fax/call with results to treat. JG//CMA

## 2013-04-12 NOTE — Telephone Encounter (Signed)
04/01/2013 Completed and signed by Dr Drue NovelPaz.   04/12/2013 Faxed to Suezanne [facility]winna Swan [facility].  Sent copy to batch, sent copy to billing.  bw

## 2013-04-15 ENCOUNTER — Telehealth: Payer: Self-pay | Admitting: Internal Medicine

## 2013-04-15 NOTE — Telephone Encounter (Signed)
I spoke with the patient's daughter today, the patient has been somehow combative x 2-3 days ; no fever chills, had diarrhea once, no nausea or vomiting, no cough.  The patient's daughter has been seen today with a viral syndrome. We recently requested a  urine culture------> please call [facility], , I need to see the results

## 2013-04-16 NOTE — Telephone Encounter (Signed)
Called and spoke with RN team lead at [facility]. She stated that the specimen that was collected on 04/08/13 was contaminated. She stated that she went out on 04/13/2013 and collected a specimen via catheter and sent for culture. The results were received this morning and she stated it was negative with no growth. She is going to fax the results to us. She stated to disregard the results from 04/08/13 since it was contaminated. JG//CMA

## 2013-04-16 NOTE — Telephone Encounter (Signed)
Advise patient's family (daughter or wife) urine culture negative, if he is still not doing well needs to be seen

## 2013-04-17 ENCOUNTER — Telehealth: Payer: Self-pay | Admitting: Internal Medicine

## 2013-04-17 NOTE — Telephone Encounter (Signed)
Sree with [facility] is calling to request verbal orders for the patient to receive physical therapy 1x a week for 3 weeks. Please advise. He can be reached back at [phone].

## 2013-04-17 NOTE — Telephone Encounter (Signed)
Please do

## 2013-04-23 NOTE — Telephone Encounter (Signed)
Please call the patient or his family, see below

## 2013-04-25 NOTE — Telephone Encounter (Signed)
Called and left 2nd message for patient. JG//CMA

## 2013-04-26 ENCOUNTER — Telehealth: Payer: Self-pay | Admitting: *Deleted

## 2013-04-26 NOTE — Telephone Encounter (Signed)
Received phone call from Hughes Betterynthia Stephens, RN from [facility]. She stated that Lopressor 25 mg is on patient med list, but is not taking it. She states that his blood pressures have been really good. Can this be taken off his med list or should he resume taking medication. Please advise. JG//CMA  *Cynthia's call back number is [phone]

## 2013-04-26 NOTE — Telephone Encounter (Signed)
Nurse notified  

## 2013-04-26 NOTE — Telephone Encounter (Signed)
Okay to DC. Also d/c rom our med list

## 2013-04-30 ENCOUNTER — Telehealth: Payer: Self-pay | Admitting: Internal Medicine

## 2013-04-30 NOTE — Telephone Encounter (Signed)
Pts daughter states the patient has congestion and severe diarrhea since Friday. They state they left a message yesterday and no one returned their call. Please call pts wife ASAP at [phone].

## 2013-04-30 NOTE — Telephone Encounter (Signed)
Try a Robitussin-DM OTC, could the  home health agency nurse check on the patient?  get some vital signs? Depending on all her eval, he may need a chest x-ray, etc .

## 2013-04-30 NOTE — Telephone Encounter (Signed)
Called and spoke with patient's wife. She states he is doing better today then over the weekend. No fever, does have cough. Diarrhea has subsided. No sleeping well at night, he is getting up by himself at night and falling. She states she has been pushing fluids. She would like to know what to do with the cough and insomnia. Please advise.

## 2013-04-30 NOTE — Telephone Encounter (Signed)
Called and spoke with [place] from [facility]. She agreed to go out tomorrow morning to evaluate patient.   Patient's wife was called and given Dr. Leta JunglingPaz's recommendations of Robitussin-DM. She was informed that Aram BeechamCynthia would be by in the morning to evaluate patient. JG//CMA

## 2013-05-01 NOTE — Telephone Encounter (Signed)
Aram Beech[facility]Cynthia from [facility] called and stated that she went out to evaluate patient today. She stated his lungs were clear. He did have clear discharge from nose and a cough. His wife started giving him Robitussin-DM yesterday as recommended and it seems to help. His BP was 138/80, pulse 76, respirations 22, and temp was 97.7 orally.

## 2013-05-01 NOTE — Telephone Encounter (Signed)
Thank you, no further action at this time

## 2013-05-21 ENCOUNTER — Telehealth: Payer: Self-pay | Admitting: *Deleted

## 2013-05-21 DIAGNOSIS — Z0279 Encounter for issue of other medical certificate: Secondary | ICD-10-CM

## 2013-05-21 NOTE — Telephone Encounter (Signed)
05/21/2013 Pt son, [name] dropped off an [facility] Examination Report to be filled out by Dr. Drue NovelPaz.  Please fax completed form to Laretta BolsterCarmen Richardson at [phone].  Name and fax are on the top of the front page. Please call [name] when form has been faxed and he would like to come by office and pick up a copy.  Form placed in [employer] Inc[employer]ica Glover's folder at the front desk.  bw

## 2013-05-22 NOTE — Telephone Encounter (Signed)
Form copied, billing sheet attached and placed in folder for [name], RN. JG//CMA

## 2013-05-23 NOTE — Telephone Encounter (Signed)
Forms placed for PCP review.

## 2013-05-24 ENCOUNTER — Ambulatory Visit: Payer: Medicare Other

## 2013-05-30 ENCOUNTER — Ambulatory Visit: Payer: Medicare Other | Admitting: Internal Medicine

## 2013-05-30 DIAGNOSIS — Z0289 Encounter for other administrative examinations: Secondary | ICD-10-CM

## 2013-05-31 ENCOUNTER — Telehealth: Payer: Self-pay | Admitting: *Deleted

## 2013-05-31 NOTE — Telephone Encounter (Signed)
Patient received a copy of the form and a copy was sent to billing./cm

## 2013-06-10 ENCOUNTER — Telehealth: Payer: Self-pay | Admitting: *Deleted

## 2013-06-10 ENCOUNTER — Telehealth: Payer: Self-pay | Admitting: Internal Medicine

## 2013-06-10 NOTE — Telephone Encounter (Signed)
Received verbal orders. Placed in quick signature folder for Dr. Drue NovelPaz to review and sign. JG//CMA

## 2013-06-10 NOTE — Telephone Encounter (Signed)
Letter written, signed and placed at front desk.   Spouse notified

## 2013-06-10 NOTE — Telephone Encounter (Signed)
Patient wife called and stated that she is putting Raymond Rangel in [facility] for a couple of hours on Wednesday and fridays. Clarene DukeWille needs dr Drue Novelpaz to write a letter for the nurses at the faclity to be abe to give him his QUEtiapine (SEROQUEL) 50 MG tablet by mouth 3 times a day. Wife would like to be able to pick this letter up tomorrow.

## 2013-06-17 ENCOUNTER — Encounter: Payer: Self-pay | Admitting: Internal Medicine

## 2013-06-18 ENCOUNTER — Encounter: Payer: Self-pay | Admitting: Internal Medicine

## 2013-07-06 ENCOUNTER — Encounter (HOSPITAL_COMMUNITY): Payer: Self-pay | Admitting: Emergency Medicine

## 2013-07-06 ENCOUNTER — Emergency Department (HOSPITAL_COMMUNITY)
Admission: EM | Admit: 2013-07-06 | Discharge: 2013-07-06 | Disposition: A | Discharge status: Home / Self Care | Payer: Medicare Other | Attending: Emergency Medicine | Admitting: Emergency Medicine

## 2013-07-06 ENCOUNTER — Emergency Department (HOSPITAL_COMMUNITY): Payer: Medicare Other

## 2013-07-06 DIAGNOSIS — Z87891 Personal history of nicotine dependence: Secondary | ICD-10-CM | POA: Insufficient documentation

## 2013-07-06 DIAGNOSIS — I2510 Atherosclerotic heart disease of native coronary artery without angina pectoris: Secondary | ICD-10-CM | POA: Insufficient documentation

## 2013-07-06 DIAGNOSIS — F0390 Unspecified dementia without behavioral disturbance: Secondary | ICD-10-CM | POA: Insufficient documentation

## 2013-07-06 DIAGNOSIS — Z79899 Other long term (current) drug therapy: Secondary | ICD-10-CM | POA: Insufficient documentation

## 2013-07-06 DIAGNOSIS — E7800 Pure hypercholesterolemia, unspecified: Secondary | ICD-10-CM | POA: Insufficient documentation

## 2013-07-06 DIAGNOSIS — Z8673 Personal history of transient ischemic attack (TIA), and cerebral infarction without residual deficits: Secondary | ICD-10-CM | POA: Insufficient documentation

## 2013-07-06 DIAGNOSIS — E119 Type 2 diabetes mellitus without complications: Secondary | ICD-10-CM | POA: Insufficient documentation

## 2013-07-06 DIAGNOSIS — Z7982 Long term (current) use of aspirin: Secondary | ICD-10-CM | POA: Insufficient documentation

## 2013-07-06 DIAGNOSIS — Z87440 Personal history of urinary (tract) infections: Secondary | ICD-10-CM | POA: Insufficient documentation

## 2013-07-06 DIAGNOSIS — M129 Arthropathy, unspecified: Secondary | ICD-10-CM | POA: Insufficient documentation

## 2013-07-06 DIAGNOSIS — Z86718 Personal history of other venous thrombosis and embolism: Secondary | ICD-10-CM | POA: Insufficient documentation

## 2013-07-06 DIAGNOSIS — Z7902 Long term (current) use of antithrombotics/antiplatelets: Secondary | ICD-10-CM | POA: Insufficient documentation

## 2013-07-06 DIAGNOSIS — Z8739 Personal history of other diseases of the musculoskeletal system and connective tissue: Secondary | ICD-10-CM | POA: Insufficient documentation

## 2013-07-06 LAB — URINALYSIS, ROUTINE W REFLEX MICROSCOPIC
Bilirubin Urine: NEGATIVE
Glucose, UA: NEGATIVE mg/dL
Hgb urine dipstick: NEGATIVE
KETONES UR: NEGATIVE mg/dL
Nitrite: NEGATIVE
PH: 7 (ref 5.0–8.0)
PROTEIN: NEGATIVE mg/dL
Specific Gravity, Urine: 1.015 (ref 1.005–1.030)
Urobilinogen, UA: 0.2 mg/dL (ref 0.0–1.0)

## 2013-07-06 LAB — COMPREHENSIVE METABOLIC PANEL
ALBUMIN: 4.2 g/dL (ref 3.5–5.2)
ALT: 9 U/L (ref 0–53)
AST: 19 U/L (ref 0–37)
Alkaline Phosphatase: 155 U/L — ABNORMAL HIGH (ref 39–117)
BILIRUBIN TOTAL: 0.5 mg/dL (ref 0.3–1.2)
BUN: 6 mg/dL (ref 6–23)
CO2: 23 meq/L (ref 19–32)
Calcium: 9.8 mg/dL (ref 8.4–10.5)
Chloride: 100 mEq/L (ref 96–112)
Creatinine, Ser: 0.66 mg/dL (ref 0.50–1.35)
GFR calc Af Amer: >90 mL/min (ref >90)
GFR calc non Af Amer: 90 mL/min — ABNORMAL LOW (ref >90)
Glucose, Bld: 98 mg/dL (ref 70–99)
POTASSIUM: 3.5 meq/L — AB (ref 3.7–5.3)
SODIUM: 142 meq/L (ref 137–147)
Total Protein: 9 g/dL — ABNORMAL HIGH (ref 6.0–8.3)

## 2013-07-06 LAB — I-STAT CG4 LACTIC ACID, ED
Lactic Acid, Venous: 2.96 mmol/L — ABNORMAL HIGH (ref 0.5–2.2)
Lactic Acid, Venous: 1.74 mmol/L (ref 0.5–2.2)

## 2013-07-06 LAB — CBC WITH DIFFERENTIAL/PLATELET
BASOS ABS: 0.1 10*3/uL (ref 0.0–0.1)
BASOS PCT: 1 % (ref 0–1)
Eosinophils Absolute: 0.2 10*3/uL (ref 0.0–0.7)
Eosinophils Relative: 2 % (ref 0–5)
HCT: 45.4 % (ref 39.0–52.0)
Hemoglobin: 15.8 g/dL (ref 13.0–17.0)
Lymphocytes Relative: 18 % (ref 12–46)
Lymphs Abs: 1.5 10*3/uL (ref 0.7–4.0)
MCH: 29.5 pg (ref 26.0–34.0)
MCHC: 34.8 g/dL (ref 30.0–36.0)
MCV: 84.9 fL (ref 78.0–100.0)
Monocytes Absolute: 0.7 10*3/uL (ref 0.1–1.0)
Monocytes Relative: 8 % (ref 3–12)
NEUTROS PCT: 71 % (ref 43–77)
Neutro Abs: 5.7 10*3/uL (ref 1.7–7.7)
Platelets: 204 10*3/uL (ref 150–400)
RBC: 5.35 MIL/uL (ref 4.22–5.81)
RDW: 15.5 % (ref 11.5–15.5)
WBC: 8.1 10*3/uL (ref 4.0–10.5)

## 2013-07-06 LAB — URINE MICROSCOPIC-ADD ON

## 2013-07-06 LAB — I-STAT TROPONIN, ED: Troponin i, poc: 0.01 ng/mL (ref 0.00–0.08)

## 2013-07-06 MED ORDER — SODIUM CHLORIDE 0.9 % IV BOLUS (SEPSIS)
1000.0000 mL | Freq: Once | INTRAVENOUS | Status: AC
  Administered 2013-07-06: 1000 mL via INTRAVENOUS

## 2013-07-06 NOTE — ED Provider Notes (Signed)
CSN: [id number]     Arrival date & time 07/06/13  1331 History   First MD Initiated Contact with Patient 07/06/13 1343     Chief Complaint  Patient presents with  . URI     (Consider location/radiation/quality/duration/timing/severity/associated sxs/prior Treatment) HPI Comments: Patient is a 80 year old male with history of stroke, irregular heartbeat, dementia, DVT, hypercholesteremia, arthritis, dibetes, infectious colitis, CAD, h/o alcohol abuse, UTI who presents today with worsening mental status. His son provides the history and states he lives at home with his wife. His wife is also elderly and struggles to care for Mr. Ringstad. The son reports that the patient's mental status has been worsening over the past 3 days. He has become progressively more confused. His urine has a strong odor to it. This is the way the patient generally acts when he has a UTI. He is unable to tell me if the patient has had a fever, cough, chest pain, shortness of breath, vomiting, or diarrhea as he has not seen the patient in 3 days.   Patient is a 80 y.o. male presenting with URI. The history is provided by a relative. No language interpreter was used.  URI   Past Medical History  Diagnosis Date  . Stroke 04-2011  . Irregular heart beat   . Dementia   . DVT (deep venous thrombosis)     L leg 04-2011  . Hypercholesteremia   . Arthritis   . Diabetes mellitus   . Infectious colitis 08/07/2012  . CAD (coronary artery disease)     stent 2005  . DJD (degenerative joint disease)   . H/O ETOH abuse   . UTI (lower urinary tract infection)     UTI, MS changes admited 02-2013   Past Surgical History  Procedure Laterality Date  . Left arm    . Rotator cuff repair  2000(L) 1999 (R)   Family History  Problem Relation Age of Onset  . Heart disease Mother   . Diabetes Mother   . Colon cancer Neg Hx   . Prostate cancer Neg Hx   . Heart disease Father    History  Substance Use Topics  . Smoking status:  Former Smoker -- 1.00 packs/day for 20 years    Types: Cigarettes    Quit date: 06/07/1958  . Smokeless tobacco: Never Used  . Alcohol Use: No     Comment: former abuse    Review of Systems  Unable to perform ROS: Dementia      Allergies  Review of patient's allergies indicates no known allergies.  Home Medications   Current Outpatient Rx  Name  Route  Sig  Dispense  Refill  . aspirin 81 MG chewable tablet   Oral   Chew 1 tablet (81 mg total) by mouth daily.         Marland Kitchen. atorvastatin (LIPITOR) 40 MG tablet   Oral   Take 1 tablet (40 mg total) by mouth daily.   30 tablet   6   . baclofen (LIORESAL) 10 MG tablet   Oral   Take 10 mg by mouth 3 (three) times daily.         . clopidogrel (PLAVIX) 75 MG tablet   Oral   Take 75 mg by mouth daily.         Marland Kitchen. donepezil (ARICEPT) 5 MG tablet   Oral   Take 1 tablet (5 mg total) by mouth at bedtime.   30 tablet   6   .  feeding supplement, ENSURE COMPLETE, (ENSURE COMPLETE) LIQD   Oral   Take 237 mLs by mouth 3 (three) times daily between meals.         Marland Kitchen QUEtiapine (SEROQUEL) 50 MG tablet   Oral   Take 1 tablet (50 mg total) by mouth 3 (three) times daily.   90 tablet   6   . vitamin B-12 1000 MCG tablet   Oral   Take 1 tablet (1,000 mcg total) by mouth daily.          BP 120/76  Pulse 91  Temp(Src) 97.8 F (36.6 C) (Oral)  Resp 16  SpO2 100% Physical Exam  Nursing note and vitals reviewed. Constitutional: He appears well-developed and well-nourished.  Non-toxic appearance. He does not have a sickly appearance. He does not appear ill. No distress.  Patient is laying comfortably in the bed  HENT:  Head: Normocephalic and atraumatic.  Right Ear: External ear normal.  Left Ear: External ear normal.  Nose: Nose normal.  Eyes: Conjunctivae and EOM are normal. Pupils are equal, round, and reactive to light.  Neck: Normal range of motion. No tracheal deviation present.  No nuchal rigidity or meningeal  signs  Cardiovascular: Normal rate, regular rhythm, normal heart sounds, intact distal pulses and normal pulses.   Pulses:      Radial pulses are 2+ on the right side, and 2+ on the left side.       Posterior tibial pulses are 2+ on the right side, and 2+ on the left side.  Pulmonary/Chest: Effort normal and breath sounds normal. No stridor.  Abdominal: Soft. He exhibits no distension. There is no tenderness. There is no rebound.  Musculoskeletal: Normal range of motion.  Neurological: He is alert.  Oriented to person  Skin: Skin is warm and dry. He is not diaphoretic.  No bed sore or ulcerations. No breaks in skin.   Psychiatric: He has a normal mood and affect. His behavior is normal.    ED Course  Procedures (including critical care time) Labs Review Labs Reviewed  URINALYSIS, ROUTINE W REFLEX MICROSCOPIC - Abnormal; Notable for the following:    APPearance CLOUDY (*)    Leukocytes, UA TRACE (*)    All other components within normal limits  COMPREHENSIVE METABOLIC PANEL - Abnormal; Notable for the following:    Potassium 3.5 (*)    Total Protein 9.0 (*)    Alkaline Phosphatase 155 (*)    GFR calc non Af Amer 90 (*)    All other components within normal limits  I-STAT CG4 LACTIC ACID, ED - Abnormal; Notable for the following:    Lactic Acid, Venous 2.96 (*)    All other components within normal limits  URINE CULTURE  CBC WITH DIFFERENTIAL  URINE MICROSCOPIC-ADD ON  Rosezena Sensor, ED  I-STAT CG4 LACTIC ACID, ED   Imaging Review Dg Chest Port 1 View  07/06/2013   CLINICAL DATA:  Altered mental status and dementia.  EXAM: PORTABLE CHEST - 1 VIEW  COMPARISON:  02/13/2013  FINDINGS: Lungs are clear bilaterally. No focal airspace disease or edema. Heart and mediastinum are within normal limits. Postsurgical changes in the right shoulder. Few densities at the right lung base suggest atelectasis.  IMPRESSION: No acute cardiopulmonary disease.   Electronically Signed   By: Richarda Overlie M.D.   On: 07/06/2013 14:40     EKG Interpretation   Date/Time:  Saturday July 06 2013 17:31:19 EDT Ventricular Rate:  67 PR Interval:  203 QRS  Duration: 141 QT Interval:  462 QTC Calculation: 488 R Axis:   -61 Text Interpretation:  Sinus rhythm Right bundle branch block No  significant change since last tracing Confirmed by BEATON  MD, ROBERT  ([id number]) on 07/06/2013 5:35:06 PM      MDM   Final diagnoses:  Dementia    Patient presents to ED for evaluation of worsening confusion. His son reports this is the way he normally acts with UTI, but later admits that the patient's wife and primary caregiver really just needs a break "before she loses her mind". Patient is afebrile, no elevated WBC. UA shows no infection. Chest XR is negative for acute disease. EKG is unchanged. Social work will speak to patient about SNF placement in the future. Dr. Jeraldine Loots evaluated patient and agrees with plan. Return instructions given. Vital signs stable for discharge. Patient / Family / Caregiver informed of clinical course, understand medical decision-making process, and agree with plan.     Mora Bellman, PA-C 07/06/13 1739

## 2013-07-06 NOTE — ED Notes (Signed)
Junious SilkHannah Merrell PA shown results of Istat lactic Acid.ED-Lab

## 2013-07-06 NOTE — ED Notes (Signed)
Results of lactic acid given to nurse Clydie BraunKaren

## 2013-07-06 NOTE — ED Notes (Signed)
Saa- phlebotomy made aware of istat sts will draw

## 2013-07-06 NOTE — ED Notes (Signed)
Social work re-paged and message left.

## 2013-07-06 NOTE — Progress Notes (Signed)
Weekend CSW received referral to speak with patient and family about nursing home placement. CSW met with patient and son Aziel Morgan [phone], explained CSW role- both agreeable to speaking. Patient has dementia and appeared disoriented at time of assessment, CSW spoke with son Simona Huh [facility]). Patient lives at home with wife, who is having difficulty caring for patient as he has recently become more confused and combative. Son believes patient would benefit from short term rehab at [facility]- prefers [facility] or [facility]. CSW offered to make referral to facilities and encouraged son to follow up with facilities on Monday. CSW also encouraged son to see patient's PCP for any other information needed for SNF placement. Son agreeable and thanked CSW for assistance.  Tilden Fossa, MSW, [place] Clinical Social Worker [facility]. [phone]

## 2013-07-06 NOTE — Discharge Instructions (Signed)
Today you were evaluated for increased confusion. All of your lab work looked really good. You were slightly dehydrated. Please try to drink more water and eat regularly. A social worker has come to talk to you and your son about possible nursing home placement. At this time you do not have a urinary tract infection. Please return with new or worsening symptoms.   Dementia Dementia is a word that is used to describe problems with the brain and how it works. People with dementia have memory loss. They may also have problems with thinking, speaking, or solving problems. It can affect how they act around people, how they do their job, their mood, and their personality. These changes may not show up for a long time. Family or friends may not notice problems in the early part of this disease. HOME CARE The following tips are for the person living with, or caring for, the person with dementia. Make the home safe.  Remove locks on bathroom doors.  Use childproof locks on cabinets where alcohol, cleaning supplies, or chemicals are stored.  Put outlet covers in electrical outlets.  Put in childproof locks to keep doors and windows safe.  Remove stove knobs, or put in safety knobs that shut off on their own.  Lower the temperature on water heaters.  Label medicines. Lock them in a safe place.  Keep knives, lighters, matches, power tools, and guns out of reach or in a safe place.  Remove objects that might break or can hurt the person.  Make sure lighting is good inside and outside.  Put in grab bars if needed.  Use a device that detects falls or other needs for help. Lessen confusion.  Keep familiar objects and people around.  Use night lights or low lit (dim) lights at night.  Label objects or areas.  Use reminders, notes, or directions for daily activities or tasks.  Keep a simple routine that is the same for waking, meals, bathing, dressing, and bedtime.  Create a calm and quiet  home.  Put up clocks and calendars.  Keep emergency numbers and the home address near all phones.  Help show the different times of day. Open the curtains during the day to let light in. Speak clearly and directly.  Choose simple words and short sentences.  Use a gentle, calm voice.  Do not interrupt.  If the person has a hard time finding a word to use, give them the word or thought.  Ask 1 question at a time. Give enough time for the person to answer. Repeat the question if the person does not answer. Do things that lessen restlessness.  Provide a comfortable bed.  Have the same bedtime routine every night.  Have a regular walking and activity schedule.  Lessen naps during the day.  Do not let the person drink a lot of caffeine.  Go to events that are not overwhelming. Eat well and drink fluids.  Lessen distractions during meal times and snacks.  Avoid foods that are too hot or too cold.  Watch how the person chews and swallows. This is to make sure they do not choke. Other  Keep all vision, hearing, dental, and medical visits with the doctor.  Only give medicines as told by the doctor.  Watch the person's driving ability. Do not let the person drive if he or she cannot drive safely.  Use a program that helps find a person if they become missing. You may need to register with this  program. GET HELP RIGHT AWAY IF:   A fever of 102 F (38.9 C) develops.  Confusion develops or gets worse.  Sleepiness develops or gets worse.  Staying awake is hard to do.  New behavior problems start like mood swings, aggression, and seeing things that are not there.  Problems with balance, speech, or falling develop.  Problems swallowing develop.  Any problems of another sickness develop. MAKE SURE YOU:  Understand these instructions.  Will watch his or her condition.  Will get help right away if he or she is not doing well or gets worse. Document Released:  03/10/2008 Document Revised: 06/20/2011 Document Reviewed: 08/23/2010 [facility] Patient Information 2014 [place], [place].   Emergency Department Resource Guide 1) Find a Doctor and Pay Out of Pocket Although you won't have to find out who is covered by your insurance plan, it is a good idea to ask around and get recommendations. You will then need to call the office and see if the doctor you have chosen will accept you as a new patient and what types of options they offer for patients who are self-pay. Some doctors offer discounts or will set up payment plans for their patients who do not have insurance, but you will need to ask so you aren't surprised when you get to your appointment.  2) Contact Your Local Health Department Not all health departments have doctors that can see patients for sick visits, but many do, so it is worth a call to see if yours does. If you don't know where your local health department is, you can check in your phone book. The CDC also has a tool to help you locate your state's health department, and many state websites also have listings of all of their local health departments.  3) Find a Walk-in Clinic If your illness is not likely to be very severe or complicated, you may want to try a walk in clinic. These are popping up all over the country in pharmacies, drugstores, and shopping centers. They're usually staffed by nurse practitioners or physician assistants that have been trained to treat common illnesses and complaints. They're usually fairly quick and inexpensive. However, if you have serious medical issues or chronic medical problems, these are probably not your best option.  No Primary Care Doctor: - Call Health Connect at  [phone] - they can help you locate a primary care doctor that  accepts your insurance, provides certain services, etc. - Physician Referral Service- [phone]  Chronic Pain Problems: Organization         Address  Phone   Notes  [facility]  [phone] Patients need to be referred by their primary care doctor.   Medication Assistance: Organization         Address  Phone   Notes  [facility] Medication [facility] [address] [place], [place] [address] [phone] --Must be a resident of [facility] -- Must have NO insurance coverage whatsoever (no Medicaid/ Medicare, etc.) -- The pt. MUST have a primary care doctor that directs their care regularly and follows them in the community   [facility]ist  [phone]   [employer]  [phone]    Agencies that provide inexpensive medical care: Organization         Address  Phone   Notes  [facility]  [phone]   [facility]    [phone]   [facility]  [address] [place], [place] [address] [phone]   [facility] [address] [place] [place] [phone]   Planned Parenthood    [phone]   [facility]    [phone]   [facility]  [address], [place] Phone:  [phone], Fax:  [phone] Hours of Operation:  9 am - 6 pm, M-F.  Also accepts Medicaid/Medicare and self-pay.  [facility]  [address], [place] Phone: [phone], Fax: [phone]. Hours of Operation:  8:30 am - 5:30 pm, M-F.  Also accepts Medicaid and self-pay.  [facility] [place] [address], [place] Phone: [phone]   [facility] [address] Natasha Bence [place], [place] [phone], Ext. [phone] Mondays & Thursdays: 7-9 AM.  First 15 patients are seen on a first come, first serve basis.    Medicaid-accepting [facility] Providers:  Organization         Address  Phone   Notes  [facility] [address], [place] [phone] Also accepts self-pay patients.  [facility] [address] Laurell Josephs [place], [place]  [phone]   [facility] [address], [place] [phone]   [facility] [address] [place] [phone]   Renaye Rakers [address], [place]   [phone] Only accepts [place] patients after they have their name applied to their card.   Self-Pay (no insurance) in [facility]:  Organization         Address  Phone   Notes  Sickle Cell Patients, [facility] [address] [place], [place] [phone]   [facility] [address] [place], [place] [phone]   [facility] [place]  [address], [place] [phone]   [facility]/Dr. Osei-Bonsu  [address] [place] or [address], [place] [phone] Phone number for both [place] and [place] locations is the same.  Urgent Medical and [facility] [address] [place] [phone]   [facility] [address], [place] or [address] [phone]   [facility] [address] [place] [phone], phone; [phone], fax Sees patients 1st and 3rd Saturday of every month.  Must not qualify for public or private insurance (i.e. Medicaid, Medicare, [place] Health Choice, Veterans' Benefits)  Household income should be no more than 200% of the poverty level The clinic cannot treat you if you are pregnant or think you are pregnant  Sexually transmitted diseases are not treated at the clinic.    Dental Care: Organization         Address  Phone  Notes  [facility] Department of [facility] [address] [place], [place] [phone] Accepts children up to age 68 who are enrolled in [place] or [place] Health Choice; pregnant women with a Medicaid card; and children who have applied for  Medicaid or [place] Health Choice, but were declined, whose parents can pay a reduced fee at time of service.  [facility] Department of [facility]  [address], [place] [phone] Accepts children up to age 79 who are enrolled in [place]  or [place] Health Choice; pregnant women with a Medicaid card; and children who have applied for Medicaid or [place] Health Choice, but were declined, whose parents can pay a reduced fee at time of service.  [facility]  [address] [place], [place] [phone] Patients are seen by appointment only. Walk-ins are not accepted. [facility] will see patients 2 years of age and older. Monday - Tuesday (8am-5pm) Most Wednesdays (8:30-5pm) $30 per visit, cash only  [facility] Access [facility]  [address], [facility] [phone] Patients are seen by appointment only. Walk-ins are not accepted. [facility] will see patients 34 years of age and older. One Wednesday Evening (Monthly: Volunteer Based).  $30 per visit, cash only  [employer]  [phone] for adults; Children under age 68, call Graduate Pediatric Dentistry at [phone]. Children aged 68-14, please call [phone] to request a pediatric application.  Dental services are provided in all areas of dental care including fillings, crowns and bridges, complete and partial dentures, implants, gum treatment, root canals, and extractions. Preventive care is also provided. Treatment is provided to both adults and children. Patients are selected via a lottery and there is often a waiting list.   [facility] [address], [place]  [phone] [email or web address]   [facility] [address] [place], [place] [phone], Ext. [phone] Second and Fourth Thursday of each month, opens at 6:30 AM; Clinic ends at 9 AM.  Patients are seen on a first-come first-served basis, and a limited number  are seen during each clinic.   [facility]  [address] [place], [place] [phone]   Eligibility Requirements You must have lived in [place], [place], or [place] counties for at least the last three months.   You cannot be eligible for state or federal sponsored [place], including [employer], [place], or [employer].   You generally cannot be eligible for healthcare insurance through your employer.    How to apply: Eligibility screenings are held every Tuesday and Wednesday afternoon from 1:00 pm until 4:00 pm. You do not need an appointment for the interview!  [facility] [address], [place], [place] [phone]   [facility] Health Department  [phone]   [facility] Health Department  [phone]   [facility] Health Department  [phone]    Behavioral Health Resources in the Community: Intensive Outpatient Programs Organization         Address  Phone  Notes  [facility] [address] [place], [place] [phone]   [facility] Outpatient [address] [place], [place] [phone]   [facility] [address], [place], [place]  [phone]   [facility] [address]  [place], [place] [phone] or [phone]   Substance Abuse Resources Organization         Address  Phone  Notes  Al[facility] Drug Services  [phone]   [facility]  [phone]   [facility] [place]  [phone]   Floydene Flock  [phone]   Residential & Outpatient Substance Abuse Program  [phone]   Psychological Services Organization         Address  Phone  Notes  [facility]  [phone]   [facility]  [phone]   [facility] [address] [place] [phone] or [phone]  Mobile Crisis Teams Organization         Address  Phone  Notes  Therapeutic  Alternatives, Mobile Crisis Care Unit  [phone]   [facility]  [address] [place], [place] [phone]   Doristine LocksSharon DeEsch [address] [place] [phone]    Self-Help/Support Groups Organization         Address  Phone             Notes  [facility] [place] - variety of support groups  336- [phone] Call for more information  Narcotics Anonymous (NA), [facility] [address], [place]  2 meetings at this location   Statisticianesidential Treatment Programs Organization         Address  Phone  Notes  [facility] [address],    [place]  [phone]   [facility]  [address], [place] [phone], [place], [place] [phone]   [facility] [address], [place] [phone] Admissions: 8am-3pm M-F  [facility] [address]    [place], [place] [phone]   [facility] [address], [place], [place] [phone]   [facility] [address]  [place], [place] [phone]   [facility] - Intensive Outpatient [address], [place], [place] [phone]   [facility] ([facility]iction Recovery Care [facility].) [address]  [place], [place] [phone] or [phone]   [facility] ([facility]) [address] [place] [address] [place], [place] [phone] Accepts Medicaid  [facility] [address]  [place] [phone] Substance Abuse/Addiction Treatment   [facility] Resources Organization         Address  Phone  Notes  [facility]  [phone]) [phone]   Angie FavaJulie Brannon, PhD [address] [place], [place]   [phone]) [phone] or [phone]   [facility]   [address] [place], [place] [phone]   [facility] [address], [place], [place] [phone] Insurance/Medicaid/sponsorship through [facility]  Faith and Families  [address]                                    [place], [place] [phone] Therapy/tele-psych/case  [facility] [address]   [place], [place] [phone]    Dr. Lolly MustacheArfeen  [phone]   [facility]  [employer] [facility] 1) [address], [place] 2) [address], [place] 3)  [address], [place] [phone]) [phone](336) [phone]  [phone](336) [phone]   [facility] Child Abuse Hotline [phone](336) [phone] or [phone]) [phone] (After Hours)

## 2013-07-06 NOTE — ED Notes (Signed)
States he woke this am with a runny nose, a sneeze and a cough. States "it feels like when i had hay fever. i think its my allergies."

## 2013-07-07 NOTE — ED Provider Notes (Signed)
  This was a shared visit with a mid-level provided (NP or PA).  Throughout the patient's course I was available for consultation/collaboration.  I saw the ECG (if appropriate), relevant labs and studies - I agree with the interpretation.  On my exam the patient was in no distress.  He had no ongoing complaints.  He specifically denied pain, nausea, SOB, or other focal complaints. SW consulted to provide assistance with services. Patient d/c home.     Gerhard Munchobert Chritopher Coster, MD 07/07/13 [id number]

## 2013-07-08 LAB — URINE CULTURE: Colony Count: >=100000

## 2013-07-09 ENCOUNTER — Telehealth (HOSPITAL_COMMUNITY): Payer: Self-pay | Admitting: *Deleted

## 2013-07-09 NOTE — ED Notes (Signed)
+   Urine Rx for Septra DS 1 tab po BID x 7 days needs to be called to pharmacy.

## 2013-07-09 NOTE — Progress Notes (Signed)
ED Antimicrobial Stewardship Positive Culture Follow Up   Minna AntisWillie Hofstra Jr. is an 80 y.o. male who [facility] with a chief complaint of  Chief Complaint  Patient presents with  . URI    Recent Results (from the past 720 hour(s))  URINE CULTURE     Status: None   Collection Time    07/06/13  3:03 PM      Result Value Ref Range Status   Specimen Description URINE, CATHETERIZED   Final   Special Requests NONE   Final   Culture  Setup Time     Final   Value: 07/06/2013 19:59     Performed at [facility]   Colony Count     Final   Value: >=100,000 COLONIES/ML     Performed at [facility]   Culture     Final   Value: ESCHERICHIA COLI     Performed at [facility]   Report Status 07/08/2013 FINAL   Final   Organism ID, Bacteria ESCHERICHIA COLI   Final    [x]  Patient discharged originally without antimicrobial agent and treatment is now indicated  New antibiotic prescription: Septra DS 1 tab PO BID x 7 days  ED Provider: Teressa LowerVrinda Pickering, NP   Sallee Provencalurner, Laneah Luft S 07/09/2013, 10:28 AM Infectious Diseases Pharmacist Phone# [phone]

## 2013-07-17 ENCOUNTER — Telehealth: Payer: Self-pay

## 2013-07-17 NOTE — Telephone Encounter (Signed)
Pharmacy request sent to refill Donepezil 5 mg. Please advise.

## 2013-07-18 MED ORDER — DONEPEZIL HCL 5 MG PO TABS: 10.0000 mg | ORAL_TABLET | Freq: Every day | ORAL | Status: DC

## 2013-07-18 NOTE — Telephone Encounter (Signed)
Donepezil refill sent to pharmacy per Dr. Wynn BankerKirsteins.

## 2013-07-18 NOTE — Telephone Encounter (Signed)
Ok to refill donepezil x 2

## 2013-07-22 ENCOUNTER — Other Ambulatory Visit: Payer: Self-pay

## 2013-07-29 ENCOUNTER — Ambulatory Visit (INDEPENDENT_AMBULATORY_CARE_PROVIDER_SITE_OTHER): Payer: Medicare Other | Admitting: Internal Medicine

## 2013-07-29 ENCOUNTER — Encounter: Payer: Self-pay | Admitting: Internal Medicine

## 2013-07-29 VITALS — BP 105/65 | HR 90 | Temp 98.0°F

## 2013-07-29 DIAGNOSIS — R5381 Other malaise: Secondary | ICD-10-CM

## 2013-07-29 DIAGNOSIS — N390 Urinary tract infection, site not specified: Secondary | ICD-10-CM

## 2013-07-29 DIAGNOSIS — F0390 Unspecified dementia without behavioral disturbance: Secondary | ICD-10-CM
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
## 2013-07-29 NOTE — Patient Instructions (Signed)
Next visit is for routine check up in 2 months  Call or come back if fever, chills, difficulty urinating, mental changes, nausea, vomiting  Desitin OTC ointment

## 2013-07-29 NOTE — Progress Notes (Signed)
Spoke with pts wife and son who is the POA, they are requesting PT and OT services for strength training. Referral order placed.

## 2013-07-29 NOTE — Assessment & Plan Note (Signed)
Currently stable, the patient definitely needs help as taking care of him at home is difficult. Will prepare a letter supporting that Will ask [facility] to send a nurse to check on him every 2 weeks

## 2013-07-29 NOTE — Assessment & Plan Note (Addendum)
Chart reviewed it's, multiple + urine cultures: 06/2012, 11-2012, 16-109610-2014,  02-2013 and 06/2013. He has recurrent/persistent UTI, probably a neurogenic bladder. We talked about a urology referral and possibly doing daily bladder catheterization but the family thinks that that would be very harsh on the patient. I don't recommend to treat with antibiotics every abnormal UCX rather treat based on  symptoms such as fever, chills or mental status. Risk of C. Difficile diarrhea and other complications from antibiotics discussed. We agreed to monitor symptoms and if needed recheck a urine culture and treat with antibiotics. The patientIs asymptomatic today.

## 2013-07-30 ENCOUNTER — Encounter: Payer: Self-pay | Admitting: *Deleted

## 2013-07-30 ENCOUNTER — Telehealth: Payer: Self-pay | Admitting: *Deleted

## 2013-07-30 ENCOUNTER — Other Ambulatory Visit: Payer: Self-pay | Admitting: Internal Medicine

## 2013-07-30 DIAGNOSIS — F03C0 Unspecified dementia, severe, without behavioral disturbance, psychotic disturbance, mood disturbance, and anxiety: Secondary | ICD-10-CM

## 2013-07-30 DIAGNOSIS — F0390 Unspecified dementia without behavioral disturbance: Secondary | ICD-10-CM

## 2013-07-30 NOTE — Telephone Encounter (Signed)
Referral ordered for [facility] to check pt every two weeks per Dr. Drue NovelPAZ. Ordered faxed.

## 2013-08-03 IMAGING — CR DG CHEST 1V PORT
1 series · 1 of 1 positions shown · non-contrast
Comparison: 08/07/2012.

CLINICAL DATA: Evaluate endotracheal tube placement.

PORTABLE CHEST - 1 VIEW

[AP]
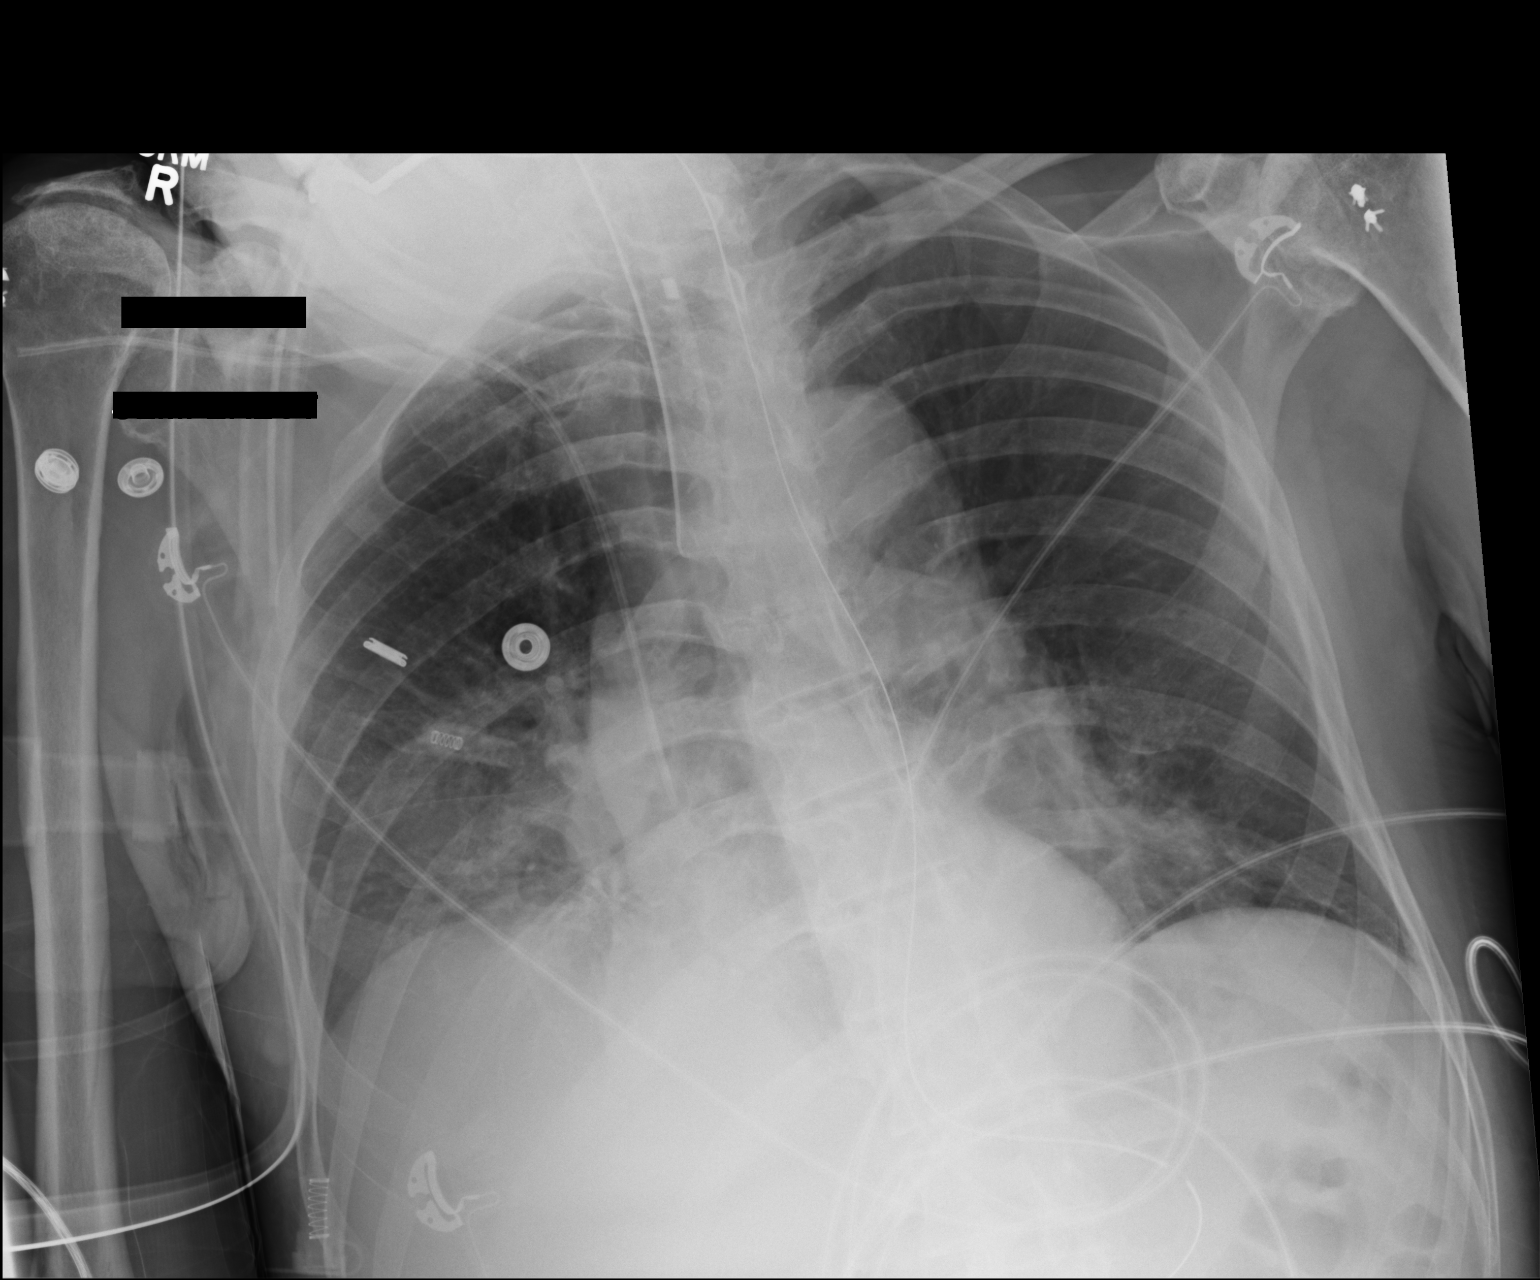

[1 of 1 positions shown; findings below may reference images not displayed]

FINDINGS: Endotracheal tube tip 3.4 cm above the carina.  Right
central line tip mid to distal superior vena cava level.

No gross pneumothorax.

Elevated right hemidiaphragm.  Right perihilar/lower lobe
consolidation and medial aspect left base consolidation with
minimal change.

Central pulmonary vascular prominence.

Nasogastric tube has been placed.  Tip not included on present
exam.  Side hole at the level of the gastric fundus.

Bilateral shoulder joint degenerative changes.

Tortuous aorta.
IMPRESSION: Similar appearance of right perihilar/bibasilar regions of
consolidation.

Central pulmonary vascular prominence.

Tortuous aorta.

Nasogastric tube placement with decompression of previously noted
gas distended stomach.

## 2013-08-16 ENCOUNTER — Telehealth: Payer: Self-pay | Admitting: Internal Medicine

## 2013-08-16 NOTE — Telephone Encounter (Signed)
Caller name:Scott Relation to WU:JWJXBpt:nurse Call back [phone] Pharmacy:  Reason for call: Scott from [facility] called and requested a order for a UA.

## 2013-08-16 NOTE — Telephone Encounter (Signed)
Left message for Lorin PicketScott to CB if needs additional info. Requested to send results to our office.

## 2013-08-16 NOTE — Telephone Encounter (Signed)
Spoke with Lorin PicketScott who states that wife is concerned due to increased odor to the patients urine.  Gave a verbal ok for UA.

## 2013-08-16 NOTE — Telephone Encounter (Signed)
Agree, needs a UA and urine culture, fax the results to us

## 2013-08-16 NOTE — Telephone Encounter (Signed)
Scott, from [facility] called back.  Please call asap

## 2013-08-21 ENCOUNTER — Telehealth: Payer: Self-pay | Admitting: Internal Medicine

## 2013-08-21 NOTE — Telephone Encounter (Signed)
LM on VM for CB

## 2013-08-21 NOTE — Telephone Encounter (Signed)
Caller name:Kelly Relation to OZ:HYQMVpt:nurse Call back number:[phone] Pharmacy:  Reason for call: Tresa EndoKelly from [facility] called to request a verbal order to extend PT for [name]. Please advise

## 2013-08-21 NOTE — Telephone Encounter (Signed)
PT states they she feels that patient and caregiver need more teaching. Would like to continue with care plan to extend treatement.  Gave verbal ok

## 2013-08-21 NOTE — Telephone Encounter (Signed)
Agree, thx! 

## 2013-08-22 ENCOUNTER — Telehealth: Payer: Self-pay | Admitting: *Deleted

## 2013-08-22 NOTE — Telephone Encounter (Signed)
Received home health certification and plan of care via fax from [facility]. Billing sheet attached and forms placed in folder for Dr. Drue NovelPaz. JG//CMA

## 2013-08-23 DIAGNOSIS — E119 Type 2 diabetes mellitus without complications: Secondary | ICD-10-CM

## 2013-08-23 DIAGNOSIS — M1990 Unspecified osteoarthritis, unspecified site: Secondary | ICD-10-CM

## 2013-08-23 DIAGNOSIS — N319 Neuromuscular dysfunction of bladder, unspecified: Secondary | ICD-10-CM

## 2013-08-23 DIAGNOSIS — F0390 Unspecified dementia without behavioral disturbance: Secondary | ICD-10-CM

## 2013-08-27 ENCOUNTER — Telehealth: Payer: Self-pay | Admitting: Internal Medicine

## 2013-08-27 DIAGNOSIS — M62838 Other muscle spasm: Secondary | ICD-10-CM

## 2013-08-27 MED ORDER — BACLOFEN 10 MG PO TABS: 5.0000 mg | ORAL_TABLET | Freq: Two times a day (BID) | ORAL | Status: DC

## 2013-08-27 NOTE — Telephone Encounter (Signed)
RX for Baclofen sent to [facility] on [address]

## 2013-08-27 NOTE — Telephone Encounter (Signed)
Caller name:Kelly Rn Relation to ZO:XWRUEpt:nurse  Call back [phone] Pharmacy:  Reason for call: Tresa EndoKelly from [facility] called to inform Dr Drue NovelPaz patient has an increase spasticity in his right arm and leg. Also to see is there a medication dr Drue NovelPaz would like to put him on to decrease it. Please advise

## 2013-08-27 NOTE — Telephone Encounter (Signed)
Left message for Tresa EndoKelly, PT to return my call regarding pts condition.

## 2013-08-27 NOTE — Telephone Encounter (Signed)
Please advise if you want the patient in for an office visit or prescribe med without being seen.

## 2013-08-27 NOTE — Telephone Encounter (Signed)
We can start baclofen 10 mg half tablet twice a day. Rx ready to be send They need to let us know if he gets too sedated or if the medicine is not working, we can increase the dose.

## 2013-10-28 ENCOUNTER — Other Ambulatory Visit: Payer: Self-pay | Admitting: Physical Medicine & Rehabilitation

## 2013-10-29 ENCOUNTER — Other Ambulatory Visit: Payer: Self-pay | Admitting: Physical Medicine & Rehabilitation

## 2013-10-30 ENCOUNTER — Ambulatory Visit (INDEPENDENT_AMBULATORY_CARE_PROVIDER_SITE_OTHER): Payer: Medicare Other | Admitting: Internal Medicine

## 2013-10-30 ENCOUNTER — Telehealth: Payer: Self-pay

## 2013-10-30 ENCOUNTER — Encounter: Payer: Self-pay | Admitting: Internal Medicine

## 2013-10-30 VITALS — BP 110/60 | HR 70 | Temp 98.0°F

## 2013-10-30 DIAGNOSIS — N390 Urinary tract infection, site not specified: Secondary | ICD-10-CM

## 2013-10-30 DIAGNOSIS — E1159 Type 2 diabetes mellitus with other circulatory complications: Secondary | ICD-10-CM

## 2013-10-30 DIAGNOSIS — R197 Diarrhea, unspecified: Secondary | ICD-10-CM

## 2013-10-30 LAB — BASIC METABOLIC PANEL
BUN: 11 mg/dL (ref 6–23)
CO2: 25 mEq/L (ref 19–32)
Calcium: 9.4 mg/dL (ref 8.4–10.5)
Chloride: 104 mEq/L (ref 96–112)
Creatinine, Ser: 0.8 mg/dL (ref 0.4–1.5)
GFR: 112.99 mL/min (ref >60.00)
GLUCOSE: 79 mg/dL (ref 70–99)
POTASSIUM: 3.7 meq/L (ref 3.5–5.1)
SODIUM: 138 meq/L (ref 135–145)

## 2013-10-30 LAB — URINALYSIS, ROUTINE W REFLEX MICROSCOPIC
Bilirubin Urine: NEGATIVE
Hgb urine dipstick: NEGATIVE
Ketones, ur: NEGATIVE
Nitrite: POSITIVE — AB
RBC / HPF: NONE SEEN (ref 0–?)
SPECIFIC GRAVITY, URINE: 1.01 (ref 1.000–1.030)
TOTAL PROTEIN, URINE-UPE24: NEGATIVE
Urine Glucose: NEGATIVE
Urobilinogen, UA: 0.2 (ref 0.0–1.0)
pH: 7.5 (ref 5.0–8.0)

## 2013-10-30 LAB — POCT URINALYSIS DIPSTICK
Bilirubin, UA: NEGATIVE
Glucose, UA: NEGATIVE
Ketones, UA: NEGATIVE
Nitrite, UA: POSITIVE
PH UA: 7.5
Protein, UA: 30
SPEC GRAV UA: 1.01
UROBILINOGEN UA: 0.2

## 2013-10-30 LAB — HEMOGLOBIN A1C: Hgb A1c MFr Bld: 6.6 % — ABNORMAL HIGH (ref 4.6–6.5)

## 2013-10-30 MED ORDER — DONEPEZIL HCL 5 MG PO TABS: 10.0000 mg | ORAL_TABLET | Freq: Every day | ORAL | Status: DC

## 2013-10-30 NOTE — Telephone Encounter (Signed)
[facility] called to verify patient is to take Seroquel and Aricept. Per Dr Drue NovelPaz verbal ok advised pharmacy.
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
## 2013-10-30 NOTE — Patient Instructions (Signed)
Get your blood work before you leave   Also get a container and bring stool samples: Stool culture, C. diff---------dx Diarrhea  Drink plenty of clear fluids Imodium OTC as needed for diarrhea  Call any time if high fever , you get worse or if not better in few days

## 2013-10-30 NOTE — Assessment & Plan Note (Signed)
See previous entry, Presents with five-day history of not feeling well, strong urine odor  but also diarrhea. Plan:  Check a urine culture, treat with results;  decision making complicated by the fact he has diarrhea

## 2013-10-30 NOTE — Progress Notes (Signed)
Pre visit review using our clinic review tool, if applicable. No additional management support is needed unless otherwise documented below in the visit note. 

## 2013-10-30 NOTE — Assessment & Plan Note (Signed)
A1c. Also wife concerned about the patient drink enough fluids, will check a BMP

## 2013-11-01 ENCOUNTER — Telehealth: Payer: Self-pay | Admitting: Physician Assistant

## 2013-11-01 DIAGNOSIS — N3000 Acute cystitis without hematuria: Secondary | ICD-10-CM

## 2013-11-01 LAB — URINE CULTURE: Colony Count: >=100000

## 2013-11-01 MED ORDER — CIPROFLOXACIN HCL 250 MG PO TABS: 250.0000 mg | ORAL_TABLET | Freq: Two times a day (BID) | ORAL | Status: DC

## 2013-11-01 MED ORDER — CEFDINIR 300 MG PO CAPS: 300.0000 mg | ORAL_CAPSULE | Freq: Two times a day (BID) | ORAL | Status: DC

## 2013-11-01 NOTE — Telephone Encounter (Signed)
Urine sensitivities finally in.  UTI sensitive to 3rd generation cephalosporins.  Will STOP augmentin given.  Start Cefdinir twice daily x 7 days.  Follow-up if symptoms are not improving.

## 2013-11-01 NOTE — Telephone Encounter (Signed)
Urine culture preliminary results are in showing UTI.  Sensitivities are pending.  Will empirically treat with Cipro BID x 7 days.  Will alter treatment if indicated by sensitivities.  Stay hydrated.  Daily cranberry supplement and Probiotic. Rx sent to [facility].

## 2013-11-01 NOTE — Telephone Encounter (Signed)
Wife has been made aware and voiced understanding. She will pick up an Rx and wait for call if we need to change script.     KP

## 2013-11-01 NOTE — Telephone Encounter (Signed)
Message copied by Marcelline MatesMARTIN, Jeronimo Hellberg on Fri Nov 01, 2013  3:02 PM ------      Message from: Arnette NorrisPAYNE, KIMBERLY P      Created: Fri Nov 01, 2013  2:36 PM       Please advise in MD's absence    KP ------

## 2013-11-04 NOTE — Telephone Encounter (Signed)
Spoke with Maximino Greenlandoyko Jones (daughter) and I advised her of the Urine culture results, she voiced understanding and has agreed to change medications, she said she figured the medication was not working because the patient was not getting better. She is [facility]     KP

## 2013-11-05 ENCOUNTER — Other Ambulatory Visit: Payer: Self-pay

## 2013-11-05 DIAGNOSIS — N3000 Acute cystitis without hematuria: Secondary | ICD-10-CM

## 2013-11-05 MED ORDER — CEFDINIR 300 MG PO CAPS: 300.0000 mg | ORAL_CAPSULE | Freq: Two times a day (BID) | ORAL | Status: DC

## 2013-12-26 ENCOUNTER — Other Ambulatory Visit: Payer: Self-pay | Admitting: Internal Medicine

## 2013-12-27 ENCOUNTER — Telehealth: Payer: Self-pay

## 2013-12-27 NOTE — Telephone Encounter (Signed)
Received paperwork from [facility] Dept for in-home aid for Pt. Paperwork given to [name] for completion.    Paperwork completed and faxed back to [facility].

## 2013-12-31 ENCOUNTER — Other Ambulatory Visit: Payer: Self-pay

## 2013-12-31 MED ORDER — ATORVASTATIN CALCIUM 40 MG PO TABS: ORAL_TABLET | ORAL | Status: DC

## 2014-02-24 ENCOUNTER — Telehealth: Payer: Self-pay | Admitting: Internal Medicine

## 2014-02-24 DIAGNOSIS — N390 Urinary tract infection, site not specified: Secondary | ICD-10-CM

## 2014-02-24 NOTE — Telephone Encounter (Signed)
Spoke with Pts wife, informed her that she can bring a sample or urine to the office for a UA and culture. I did inform her that if Pt begins running fever, chills, he will need to be seen. Pts wife verbalized understanding.

## 2014-02-24 NOTE — Telephone Encounter (Signed)
Okay to bring a sample  in a sterile container which we can provide. I enter the order for a UA and urine culture, will treat based on results. If fever, chills or severe symptoms -------needs to be seen.

## 2014-02-24 NOTE — Telephone Encounter (Signed)
Caller name:Raymond Rangel Relation to pt:son Call back number:[phone] Pharmacy:  Reason for call: pt's son states pt has another UTI and the pt is very weak and he struggles trying to get the pt around, son would like to know if dr. Drue NovelPaz can allow him to bring in the urine sample without having to bring the pt in first. Call son at [phone].

## 2014-02-24 NOTE — Telephone Encounter (Signed)
Please advise 

## 2014-03-24 ENCOUNTER — Other Ambulatory Visit: Payer: Self-pay | Admitting: Internal Medicine

## 2014-05-08 ENCOUNTER — Emergency Department (HOSPITAL_COMMUNITY): Payer: Medicare Other

## 2014-05-08 ENCOUNTER — Observation Stay (HOSPITAL_COMMUNITY)
Admission: EM | Admit: 2014-05-08 | Discharge: 2014-05-12 | Disposition: A | Discharge status: Skilled Nursing Facility | Payer: Medicare Other | Attending: Internal Medicine | Admitting: Internal Medicine

## 2014-05-08 ENCOUNTER — Encounter (HOSPITAL_COMMUNITY): Payer: Self-pay | Admitting: Emergency Medicine

## 2014-05-08 ENCOUNTER — Observation Stay (HOSPITAL_COMMUNITY): Payer: Medicare Other

## 2014-05-08 DIAGNOSIS — R531 Weakness: Secondary | ICD-10-CM | POA: Diagnosis not present

## 2014-05-08 DIAGNOSIS — I69393 Ataxia following cerebral infarction: Secondary | ICD-10-CM | POA: Insufficient documentation

## 2014-05-08 DIAGNOSIS — R4182 Altered mental status, unspecified: Secondary | ICD-10-CM | POA: Diagnosis not present

## 2014-05-08 DIAGNOSIS — Z7982 Long term (current) use of aspirin: Secondary | ICD-10-CM | POA: Diagnosis not present

## 2014-05-08 DIAGNOSIS — I69351 Hemiplegia and hemiparesis following cerebral infarction affecting right dominant side: Secondary | ICD-10-CM | POA: Diagnosis not present

## 2014-05-08 DIAGNOSIS — R404 Transient alteration of awareness: Secondary | ICD-10-CM | POA: Diagnosis not present

## 2014-05-08 DIAGNOSIS — R627 Adult failure to thrive: Secondary | ICD-10-CM | POA: Insufficient documentation

## 2014-05-08 DIAGNOSIS — M5136 Other intervertebral disc degeneration, lumbar region: Secondary | ICD-10-CM | POA: Diagnosis not present

## 2014-05-08 DIAGNOSIS — R630 Anorexia: Secondary | ICD-10-CM | POA: Diagnosis not present

## 2014-05-08 DIAGNOSIS — I6782 Cerebral ischemia: Secondary | ICD-10-CM | POA: Diagnosis not present

## 2014-05-08 DIAGNOSIS — E876 Hypokalemia: Secondary | ICD-10-CM | POA: Insufficient documentation

## 2014-05-08 DIAGNOSIS — F0390 Unspecified dementia without behavioral disturbance: Secondary | ICD-10-CM | POA: Diagnosis not present

## 2014-05-08 DIAGNOSIS — E780 Pure hypercholesterolemia: Secondary | ICD-10-CM | POA: Insufficient documentation

## 2014-05-08 DIAGNOSIS — Z86718 Personal history of other venous thrombosis and embolism: Secondary | ICD-10-CM | POA: Insufficient documentation

## 2014-05-08 DIAGNOSIS — R410 Disorientation, unspecified: Secondary | ICD-10-CM

## 2014-05-08 DIAGNOSIS — R102 Pelvic and perineal pain: Secondary | ICD-10-CM | POA: Diagnosis not present

## 2014-05-08 DIAGNOSIS — G9349 Other encephalopathy: Secondary | ICD-10-CM | POA: Diagnosis not present

## 2014-05-08 DIAGNOSIS — Z87891 Personal history of nicotine dependence: Secondary | ICD-10-CM | POA: Diagnosis not present

## 2014-05-08 DIAGNOSIS — E119 Type 2 diabetes mellitus without complications: Secondary | ICD-10-CM | POA: Diagnosis not present

## 2014-05-08 DIAGNOSIS — J9811 Atelectasis: Secondary | ICD-10-CM | POA: Diagnosis not present

## 2014-05-08 DIAGNOSIS — M1990 Unspecified osteoarthritis, unspecified site: Secondary | ICD-10-CM | POA: Insufficient documentation

## 2014-05-08 DIAGNOSIS — M160 Bilateral primary osteoarthritis of hip: Secondary | ICD-10-CM | POA: Diagnosis not present

## 2014-05-08 DIAGNOSIS — Z87440 Personal history of urinary (tract) infections: Secondary | ICD-10-CM | POA: Diagnosis not present

## 2014-05-08 DIAGNOSIS — Z23 Encounter for immunization: Secondary | ICD-10-CM | POA: Insufficient documentation

## 2014-05-08 DIAGNOSIS — I2510 Atherosclerotic heart disease of native coronary artery without angina pectoris: Secondary | ICD-10-CM | POA: Insufficient documentation

## 2014-05-08 DIAGNOSIS — I69993 Ataxia following unspecified cerebrovascular disease: Secondary | ICD-10-CM

## 2014-05-08 LAB — CBC WITH DIFFERENTIAL/PLATELET
Basophils Absolute: 0 10*3/uL (ref 0.0–0.1)
Basophils Relative: 0 % (ref 0–1)
EOS PCT: 4 % (ref 0–5)
Eosinophils Absolute: 0.4 10*3/uL (ref 0.0–0.7)
HEMATOCRIT: 36.5 % — AB (ref 39.0–52.0)
Hemoglobin: 11.9 g/dL — ABNORMAL LOW (ref 13.0–17.0)
Lymphocytes Relative: 18 % (ref 12–46)
Lymphs Abs: 1.7 10*3/uL (ref 0.7–4.0)
MCH: 28 pg (ref 26.0–34.0)
MCHC: 32.6 g/dL (ref 30.0–36.0)
MCV: 85.9 fL (ref 78.0–100.0)
MONO ABS: 0.6 10*3/uL (ref 0.1–1.0)
Monocytes Relative: 6 % (ref 3–12)
NEUTROS ABS: 7 10*3/uL (ref 1.7–7.7)
Neutrophils Relative %: 72 % (ref 43–77)
Platelets: 164 10*3/uL (ref 150–400)
RBC: 4.25 MIL/uL (ref 4.22–5.81)
RDW: 15.2 % (ref 11.5–15.5)
WBC: 9.7 10*3/uL (ref 4.0–10.5)

## 2014-05-08 LAB — URINALYSIS, ROUTINE W REFLEX MICROSCOPIC
Bilirubin Urine: NEGATIVE
Glucose, UA: NEGATIVE mg/dL
Hgb urine dipstick: NEGATIVE
Ketones, ur: NEGATIVE mg/dL
LEUKOCYTES UA: NEGATIVE
NITRITE: NEGATIVE
PROTEIN: 30 mg/dL — AB
Specific Gravity, Urine: 1.023 (ref 1.005–1.030)
UROBILINOGEN UA: 2 mg/dL — AB (ref 0.0–1.0)
pH: 6 (ref 5.0–8.0)

## 2014-05-08 LAB — URINE MICROSCOPIC-ADD ON

## 2014-05-08 LAB — COMPREHENSIVE METABOLIC PANEL
ALK PHOS: 106 U/L (ref 39–117)
ALT: 18 U/L (ref 0–53)
AST: 30 U/L (ref 0–37)
Albumin: 3 g/dL — ABNORMAL LOW (ref 3.5–5.2)
Anion gap: 9 (ref 5–15)
BUN: 9 mg/dL (ref 6–23)
CALCIUM: 8.6 mg/dL (ref 8.4–10.5)
CHLORIDE: 105 mmol/L (ref 96–112)
CO2: 25 mmol/L (ref 19–32)
Creatinine, Ser: 0.89 mg/dL (ref 0.50–1.35)
GFR calc Af Amer: >90 mL/min (ref >90)
GFR, EST NON AFRICAN AMERICAN: 79 mL/min — AB (ref >90)
Glucose, Bld: 137 mg/dL — ABNORMAL HIGH (ref 70–99)
POTASSIUM: 3.1 mmol/L — AB (ref 3.5–5.1)
Sodium: 139 mmol/L (ref 135–145)
Total Bilirubin: 0.7 mg/dL (ref 0.3–1.2)
Total Protein: 7.3 g/dL (ref 6.0–8.3)

## 2014-05-08 LAB — I-STAT CG4 LACTIC ACID, ED: LACTIC ACID, VENOUS: 1.99 mmol/L (ref 0.5–2.0)

## 2014-05-08 MED ORDER — POTASSIUM CHLORIDE CRYS ER 20 MEQ PO TBCR
40.0000 meq | EXTENDED_RELEASE_TABLET | Freq: Once | ORAL | Status: AC
  Administered 2014-05-08: 40 meq via ORAL
  Filled 2014-05-08: qty 2

## 2014-05-08 NOTE — ED Notes (Signed)
Per EMS: pt from home with caregiver, who reports weakness and loss of appetite x several days. Pt denies any pain, denies any neuro deficits. Pt family reports that pt has hx of UTI and has same symptoms when he gets UTI. NAD noted. Pt with hx of dementia.

## 2014-05-08 NOTE — ED Provider Notes (Signed)
CSN: [id number]     Arrival date & time 05/08/14  2015 History   First MD Initiated Contact with Patient 05/08/14 2016     Chief Complaint  Patient presents with  . Weakness     (Consider location/radiation/quality/duration/timing/severity/associated sxs/prior Treatment) Patient is a 81 y.o. male presenting with altered mental status. The history is provided by a relative. No language interpreter was used.  Altered Mental Status Presenting symptoms: behavior changes and confusion   Severity:  Mild Most recent episode:  Today Timing:  Constant Progression:  Unchanged Chronicity:  New Context: not alcohol use, not dementia, not drug use, taking medications as prescribed and not a recent change in medication   Associated symptoms: decreased appetite   Associated symptoms: no abdominal pain, no agitation, no eye deviation, no fever and no weakness     Past Medical History  Diagnosis Date  . Stroke 04-2011  . Irregular heart beat   . Dementia   . DVT (deep venous thrombosis)     L leg 04-2011  . Hypercholesteremia   . Arthritis   . Diabetes mellitus   . Infectious colitis 08/07/2012  . CAD (coronary artery disease)     stent 2005  . DJD (degenerative joint disease)   . H/O ETOH abuse   . Recurrent UTI     UTI, MS changes admited 02-2013   Past Surgical History  Procedure Laterality Date  . Left arm    . Rotator cuff repair  2000(L) 1999 (R)   Family History  Problem Relation Age of Onset  . Heart disease Mother   . Diabetes Mother   . Colon cancer Neg Hx   . Prostate cancer Neg Hx   . Heart disease Father    History  Substance Use Topics  . Smoking status: Former Smoker -- 1.00 packs/day for 20 years    Types: Cigarettes    Quit date: 06/07/1958  . Smokeless tobacco: Never Used  . Alcohol Use: No     Comment: former abuse    Review of Systems  Unable to perform ROS: Dementia  Constitutional: Positive for decreased appetite. Negative for fever.   Gastrointestinal: Negative for abdominal pain.  Neurological: Negative for weakness.  Psychiatric/Behavioral: Positive for confusion. Negative for agitation.      Allergies  Review of patient's allergies indicates no known allergies.  Home Medications   Prior to Admission medications   Medication Sig Start Date End Date Taking? Authorizing Provider  atorvastatin (LIPITOR) 40 MG tablet TAKE ONE TABLET BY MOUTH ONCE DAILY Patient taking differently: Take 40 mg by mouth daily.  12/31/13   Wanda Plump, MD  baclofen (LIORESAL) 10 MG tablet Take 0.5 tablets (5 mg total) by mouth 2 (two) times daily. 08/27/13   Wanda Plump, MD  cefdinir (OMNICEF) 300 MG capsule Take 1 capsule (300 mg total) by mouth 2 (two) times daily. 11/05/13   Waldon Merl, PA-C  cyanocobalamin 500 MCG tablet Take 500 mcg by mouth daily.    Historical Provider, MD  donepezil (ARICEPT) 5 MG tablet Take 2 tablets (10 mg total) by mouth at bedtime. 10/30/13   Wanda Plump, MD  QUEtiapine (SEROQUEL) 50 MG tablet Take 1 tablet (50 mg total) by mouth 3 (three) times daily. DUE FOR APPT WITH DR [name] [phone]. 03/24/14   Wanda Plump, MD   BP 150/73 mmHg  Pulse 79  Temp(Src) 98.1 F (36.7 C) (Oral)  Resp 13  Ht 5\' 8"  (1.727 m)  Wt  121 lb 4.1 oz (55 kg)  BMI 18.44 kg/m2  SpO2 98% Physical Exam  Constitutional: He appears well-developed and well-nourished. No distress.  HENT:  Head: Normocephalic and atraumatic.  Eyes: Pupils are equal, round, and reactive to light.  Neck: Normal range of motion.  Cardiovascular: Normal rate, regular rhythm and normal heart sounds.   Pulmonary/Chest: Effort normal and breath sounds normal. No respiratory distress. He has no decreased breath sounds. He has no wheezes. He has no rhonchi. He has no rales.  Abdominal: Soft. He exhibits no distension. There is no tenderness. There is no rebound and no guarding.  Musculoskeletal: He exhibits no edema or tenderness.  Neurological: He is alert. He is  disoriented. He exhibits normal muscle tone.  Rigidity of the right arm and leg.  Slightly increased rigidity of the R leg per family.  No weakness.  Responds to pain x4 extremities.    Skin: Skin is warm and dry.  Nursing note and vitals reviewed.   ED Course  Procedures (including critical care time) Labs Review Labs Reviewed  CBC WITH DIFFERENTIAL/PLATELET - Abnormal; Notable for the following:    Hemoglobin 11.9 (*)    HCT 36.5 (*)    All other components within normal limits  COMPREHENSIVE METABOLIC PANEL - Abnormal; Notable for the following:    Potassium 3.1 (*)    Glucose, Bld 137 (*)    Albumin 3.0 (*)    GFR calc non Af Amer 79 (*)    All other components within normal limits  URINALYSIS, ROUTINE W REFLEX MICROSCOPIC - Abnormal; Notable for the following:    Protein, ur 30 (*)    Urobilinogen, UA 2.0 (*)    All other components within normal limits  URINE MICROSCOPIC-ADD ON - Abnormal; Notable for the following:    Casts HYALINE CASTS (*)    All other components within normal limits  SEDIMENTATION RATE - Abnormal; Notable for the following:    Sed Rate 63 (*)    All other components within normal limits  COMPREHENSIVE METABOLIC PANEL - Abnormal; Notable for the following:    Glucose, Bld 105 (*)    Albumin 2.6 (*)    GFR calc non Af Amer 81 (*)    Anion gap 4 (*)    All other components within normal limits  CBC - Abnormal; Notable for the following:    RBC 4.07 (*)    Hemoglobin 11.2 (*)    HCT 35.1 (*)    All other components within normal limits  GLUCOSE, CAPILLARY - Abnormal; Notable for the following:    Glucose-Capillary 106 (*)    All other components within normal limits  GLUCOSE, CAPILLARY - Abnormal; Notable for the following:    Glucose-Capillary 108 (*)    All other components within normal limits  TSH  GLUCOSE, CAPILLARY  HEMOGLOBIN A1C  VITAMIN B12  ANA  RPR  HIV ANTIBODY (ROUTINE TESTING)  I-STAT CG4 LACTIC ACID, ED    Imaging  Review Dg Chest 2 View  05/08/2014   CLINICAL DATA:  Weakness and loss of appetite for several days.  EXAM: CHEST  2 VIEW  COMPARISON:  Single view of the chest 07/06/2013 and 02/13/2013.  FINDINGS: Mild left basilar atelectasis is noted. The lungs are otherwise clear. No pneumothorax or pleural effusion. Heart size is normal. Postoperative change both shoulders is noted.  IMPRESSION: No acute disease.   Electronically Signed   By: Drusilla Kannerhomas  Dalessio M.D.   On: 05/08/2014 21:19   Dg  Pelvis 1-2 Views  05/08/2014   CLINICAL DATA:  Pelvic pain and weakness.  EXAM: PELVIS - 1-2 VIEW  COMPARISON:  Abdomen 04/06/2009  FINDINGS: There is no evidence of pelvic fracture or diastasis. No pelvic bone lesions are seen. Evaluation of the sacrum is limited due to overlying bowel gas. Degenerative changes in the lower lumbar spine and hips.  IMPRESSION: Degenerative changes in the lower lumbar spine and hips. No acute bony abnormalities.   Electronically Signed   By: Burman Nieves M.D.   On: 05/08/2014 23:53   Ct Head Wo Contrast  05/08/2014   CLINICAL DATA:  Weakness and loss of appetite for several days. Right-sided weakness.  EXAM: CT HEAD WITHOUT CONTRAST  TECHNIQUE: Contiguous axial images were obtained from the base of the skull through the vertex without intravenous contrast.  COMPARISON:  02/14/2013  FINDINGS: Diffuse cerebral atrophy. Prominent ventricular dilatation consistent with central atrophy. Low-attenuation change throughout the deep white matter consistent small vessel ischemia. Old lacunar infarcts in the deep white matter. Focal old area of encephalomalacia in the left occipital region. No significant change since prior study. No mass effect or midline shift. No abnormal extra-axial fluid collections. Gray-white matter junctions are distinct. Basal cisterns are not effaced. No evidence of acute intracranial hemorrhage. No depressed skull fractures. Mucosal thickening and partial opacification in the  left maxillary antrum. Small retention cyst in the right maxillary antrum. Vascular calcifications. Mastoid air cells are not opacified.  IMPRESSION: No acute intracranial abnormalities. Chronic atrophy and small vessel ischemic changes with multiple old lacunar infarcts and old infarct in the left occipital region. Inflammatory changes in the paranasal sinuses.   Electronically Signed   By: Burman Nieves M.D.   On: 05/08/2014 22:06     EKG Interpretation None      MDM   Final diagnoses:  Altered mental status, unspecified altered mental status type    The patient is an 81 year-old [place] American male with pertinent past medical history of dementia.  His primary caregiver is his 65 year old wife who comes to the emergency department today with altered mental status for the past 24 hours. Physical exam as above. Patient's son is concerned because he feels this is a similar presentation to previous UTIs. Initial workup included a UA, lactic acid, CBC, CMP, chest x-ray, and a CT of his head. CT of the head demonstrated no acute intracranial abnormalities. Patient has had multiple CVAs however he does not have any new focal neurologic deficits as a result I doubt a CVA at this time. His son was concerned because he was holding his right knee flexed which he has done after previous strokes. Patient's UA demonstrated no leukocytes no nitrites no bacteria as resulted doubt UTI.  Lactic acid was 1.9. CBC was unremarkable. CMP had a potassium of 3.1 this was repleted with 40 mg of potassium by mouth. Patient does not have any history of liver disease as result I do not feel he requires a pneumonia. Patient's care was further discussed with the patient's son who is very concerned about the patient's change in mental status.  As a result I feel he requires admission to the hospital for his altered mental status with no known cause. Patient's care was discussed with the hospitalist to admit the patient for  observation. With the patient's potential hip pain an x-ray of the pelvis was obtained to evaluate for fractures. Patient does not ambulate and has not had any injuries.  As a result I feel  fracture is unlikely. The patient was admitted to the hospitalist service in a good condition. Labs and imaging were reviewed by myself and considered in medical decision making. Imaging was interpreted by Radiology. Care was discussed with my attending Dr. Criss Alvine.      Bethann Berkshire, MD 05/09/14 1251  Audree Camel, MD 05/13/14 [phone]

## 2014-05-08 NOTE — H&P (Signed)
Raymond Rangel. is an 81 y.o. male.    Pcp:  Joretta Bachelor Complaint: altered ms HPI: 81 yo male with hx of CVA and dementia apparently seemed to have more confusion and also generalized weakness and FTT for the past couple of days.  Pt was brought to ED by his family for evaluation of being less responsive and CT brain was negative for any acute process.  Pt was noted to be hypokalemic with potassium of 3.4  Pt will be admitted for FTT, and ataxia likely related to worsening dementia, and hypokalemia.   Past Medical History  Diagnosis Date  . Stroke 04-2011  . Irregular heart beat   . Dementia   . DVT (deep venous thrombosis)     L leg 04-2011  . Hypercholesteremia   . Arthritis   . Diabetes mellitus   . Infectious colitis 08/07/2012  . CAD (coronary artery disease)     stent 2005  . DJD (degenerative joint disease)   . H/O ETOH abuse   . Recurrent UTI     UTI, MS changes admited 02-2013    Past Surgical History  Procedure Laterality Date  . Left arm    . Rotator cuff repair  2000(L) 1999 (R)    Family History  Problem Relation Age of Onset  . Heart disease Mother   . Diabetes Mother   . Colon cancer Neg Hx   . Prostate cancer Neg Hx   . Heart disease Father    Social History:  reports that he quit smoking about 55 years ago. His smoking use included Cigarettes. He has a 20 pack-year smoking history. He has never used smokeless tobacco. He reports that he does not drink alcohol or use illicit drugs.  Allergies: No Known Allergies   (Not in a hospital admission)  Results for orders placed or performed during the hospital encounter of 05/08/14 (from the past 48 hour(s))  CBC with Differential     Status: Abnormal   Collection Time: 05/08/14  8:45 PM  Result Value Ref Range   WBC 9.7 4.0 - 10.5 K/uL   RBC 4.25 4.22 - 5.81 MIL/uL   Hemoglobin 11.9 (L) 13.0 - 17.0 g/dL   HCT 36.5 (L) 39.0 - 52.0 %   MCV 85.9 78.0 - 100.0 fL   MCH 28.0 26.0 - 34.0 pg   MCHC 32.6  30.0 - 36.0 g/dL   RDW 15.2 11.5 - 15.5 %   Platelets 164 150 - 400 K/uL   Neutrophils Relative % 72 43 - 77 %   Neutro Abs 7.0 1.7 - 7.7 K/uL   Lymphocytes Relative 18 12 - 46 %   Lymphs Abs 1.7 0.7 - 4.0 K/uL   Monocytes Relative 6 3 - 12 %   Monocytes Absolute 0.6 0.1 - 1.0 K/uL   Eosinophils Relative 4 0 - 5 %   Eosinophils Absolute 0.4 0.0 - 0.7 K/uL   Basophils Relative 0 0 - 1 %   Basophils Absolute 0.0 0.0 - 0.1 K/uL  Comprehensive metabolic panel     Status: Abnormal   Collection Time: 05/08/14  8:45 PM  Result Value Ref Range   Sodium 139 135 - 145 mmol/L   Potassium 3.1 (L) 3.5 - 5.1 mmol/L   Chloride 105 96 - 112 mmol/L   CO2 25 19 - 32 mmol/L   Glucose, Bld 137 (H) 70 - 99 mg/dL   BUN 9 6 - 23 mg/dL   Creatinine, Ser 0.89 0.50 -  1.35 mg/dL   Calcium 8.6 8.4 - 10.5 mg/dL   Total Protein 7.3 6.0 - 8.3 g/dL   Albumin 3.0 (L) 3.5 - 5.2 g/dL   AST 30 0 - 37 U/L   ALT 18 0 - 53 U/L   Alkaline Phosphatase 106 39 - 117 U/L   Total Bilirubin 0.7 0.3 - 1.2 mg/dL   GFR calc non Af Amer 79 (L) >90 mL/min   GFR calc Af Amer >90 >90 mL/min    Comment: (NOTE) The eGFR has been calculated using the CKD EPI equation. This calculation has not been validated in all clinical situations. eGFR's persistently <90 mL/min signify possible Chronic Kidney Disease.    Anion gap 9 5 - 15  I-Stat CG4 Lactic Acid, ED     Status: None   Collection Time: 05/08/14  8:53 PM  Result Value Ref Range   Lactic Acid, Venous 1.99 0.5 - 2.0 mmol/L  Urinalysis, Routine w reflex microscopic     Status: Abnormal   Collection Time: 05/08/14  9:40 PM  Result Value Ref Range   Color, Urine YELLOW YELLOW   APPearance CLEAR CLEAR   Specific Gravity, Urine 1.023 1.005 - 1.030   pH 6.0 5.0 - 8.0   Glucose, UA NEGATIVE NEGATIVE mg/dL   Hgb urine dipstick NEGATIVE NEGATIVE   Bilirubin Urine NEGATIVE NEGATIVE   Ketones, ur NEGATIVE NEGATIVE mg/dL   Protein, ur 30 (A) NEGATIVE mg/dL   Urobilinogen, UA  2.0 (H) 0.0 - 1.0 mg/dL   Nitrite NEGATIVE NEGATIVE   Leukocytes, UA NEGATIVE NEGATIVE  Urine microscopic-add on     Status: Abnormal   Collection Time: 05/08/14  9:40 PM  Result Value Ref Range   WBC, UA 0-2 <3 WBC/hpf   RBC / HPF 0-2 <3 RBC/hpf   Bacteria, UA RARE RARE   Casts HYALINE CASTS (A) NEGATIVE   Urine-Other MUCOUS PRESENT    Dg Chest 2 View  05/08/2014   CLINICAL DATA:  Weakness and loss of appetite for several days.  EXAM: CHEST  2 VIEW  COMPARISON:  Single view of the chest 07/06/2013 and 02/13/2013.  FINDINGS: Mild left basilar atelectasis is noted. The lungs are otherwise clear. No pneumothorax or pleural effusion. Heart size is normal. Postoperative change both shoulders is noted.  IMPRESSION: No acute disease.   Electronically Signed   By: Inge Rise M.D.   On: 05/08/2014 21:19   Dg Pelvis 1-2 Views  05/08/2014   CLINICAL DATA:  Pelvic pain and weakness.  EXAM: PELVIS - 1-2 VIEW  COMPARISON:  Abdomen 04/06/2009  FINDINGS: There is no evidence of pelvic fracture or diastasis. No pelvic bone lesions are seen. Evaluation of the sacrum is limited due to overlying bowel gas. Degenerative changes in the lower lumbar spine and hips.  IMPRESSION: Degenerative changes in the lower lumbar spine and hips. No acute bony abnormalities.   Electronically Signed   By: Lucienne Capers M.D.   On: 05/08/2014 23:53   Ct Head Wo Contrast  05/08/2014   CLINICAL DATA:  Weakness and loss of appetite for several days. Right-sided weakness.  EXAM: CT HEAD WITHOUT CONTRAST  TECHNIQUE: Contiguous axial images were obtained from the base of the skull through the vertex without intravenous contrast.  COMPARISON:  02/14/2013  FINDINGS: Diffuse cerebral atrophy. Prominent ventricular dilatation consistent with central atrophy. Low-attenuation change throughout the deep white matter consistent small vessel ischemia. Old lacunar infarcts in the deep white matter. Focal old area of encephalomalacia in the  left occipital region. No significant change since prior study. No mass effect or midline shift. No abnormal extra-axial fluid collections. Gray-white matter junctions are distinct. Basal cisterns are not effaced. No evidence of acute intracranial hemorrhage. No depressed skull fractures. Mucosal thickening and partial opacification in the left maxillary antrum. Small retention cyst in the right maxillary antrum. Vascular calcifications. Mastoid air cells are not opacified.  IMPRESSION: No acute intracranial abnormalities. Chronic atrophy and small vessel ischemic changes with multiple old lacunar infarcts and old infarct in the left occipital region. Inflammatory changes in the paranasal sinuses.   Electronically Signed   By: Lucienne Capers M.D.   On: 05/08/2014 22:06    Review of Systems  Constitutional: Negative for fever, chills, weight loss, malaise/fatigue and diaphoresis.  HENT: Negative for congestion, ear discharge, ear pain, hearing loss, nosebleeds, sore throat and tinnitus.   Eyes: Negative for blurred vision, double vision, photophobia, pain, discharge and redness.  Respiratory: Negative for cough, hemoptysis, sputum production, shortness of breath, wheezing and stridor.   Cardiovascular: Negative for chest pain, palpitations, orthopnea, claudication, leg swelling and PND.  Gastrointestinal: Negative for heartburn, nausea, vomiting, abdominal pain, diarrhea, constipation, blood in stool and melena.  Genitourinary: Negative for dysuria, urgency, frequency, hematuria and flank pain.  Musculoskeletal: Negative for myalgias, back pain, joint pain, falls and neck pain.  Skin: Negative for itching and rash.  Neurological: Positive for weakness. Negative for dizziness, tingling, tremors, sensory change, speech change, focal weakness, seizures, loss of consciousness and headaches.  Endo/Heme/Allergies: Negative for environmental allergies and polydipsia. Does not bruise/bleed easily.   Psychiatric/Behavioral: Negative for depression, suicidal ideas, hallucinations, memory loss and substance abuse. The patient is not nervous/anxious and does not have insomnia.     Blood pressure 141/76, pulse 80, temperature 98 F (36.7 C), temperature source Oral, resp. rate 23, SpO2 100 %. Physical Exam  Constitutional: He appears well-developed and well-nourished.  HENT:  Head: Normocephalic and atraumatic.  Eyes: Conjunctivae and EOM are normal. Pupils are equal, round, and reactive to light. No scleral icterus.  Neck: Normal range of motion. Neck supple. No JVD present. No tracheal deviation present. No thyromegaly present.  Cardiovascular: Normal rate and regular rhythm.  Exam reveals no gallop and no friction rub.   No murmur heard. Respiratory: Effort normal and breath sounds normal. No respiratory distress. He has no wheezes. He has no rales.  GI: Soft. Bowel sounds are normal. He exhibits no distension. There is no tenderness. There is no rebound and no guarding.  Musculoskeletal: Normal range of motion. He exhibits no edema or tenderness.  Lymphadenopathy:    He has no cervical adenopathy.  Neurological: He is alert. He has normal reflexes. He displays normal reflexes. No cranial nerve deficit. He exhibits normal muscle tone. Coordination normal.  Skin: Skin is warm and dry. No rash noted. No erythema. No pallor.  Psychiatric: He has a normal mood and affect. His behavior is normal. Judgment and thought content normal.     Assessment/Plan Weakness Replete potassium   AMS, FTT Observe, check tsh esr, b12, rpr, ana Probably de to worsening dementia.   Dementia Cont aricept  Hypokalemia Replete Check cmp in am  Dm2 fsbs ac and qhs, iss  CVA Aspirin $RemoveBef'81mg'[id number]$  po qday, lipitor  DVT prophylaxis: scd, lovenox    Jani Gravel 05/08/2014, 11:57 PM

## 2014-05-09 ENCOUNTER — Encounter (HOSPITAL_COMMUNITY): Payer: Self-pay | Admitting: *Deleted

## 2014-05-09 ENCOUNTER — Observation Stay (HOSPITAL_COMMUNITY): Payer: Medicare Other

## 2014-05-09 ENCOUNTER — Ambulatory Visit: Payer: Self-pay | Admitting: Medical

## 2014-05-09 DIAGNOSIS — R531 Weakness: Secondary | ICD-10-CM

## 2014-05-09 DIAGNOSIS — R4182 Altered mental status, unspecified: Secondary | ICD-10-CM | POA: Diagnosis not present

## 2014-05-09 DIAGNOSIS — I6782 Cerebral ischemia: Secondary | ICD-10-CM | POA: Diagnosis not present

## 2014-05-09 DIAGNOSIS — F0390 Unspecified dementia without behavioral disturbance: Secondary | ICD-10-CM | POA: Diagnosis not present

## 2014-05-09 DIAGNOSIS — F0391 Unspecified dementia with behavioral disturbance: Secondary | ICD-10-CM

## 2014-05-09 DIAGNOSIS — G9349 Other encephalopathy: Secondary | ICD-10-CM | POA: Diagnosis not present

## 2014-05-09 DIAGNOSIS — E119 Type 2 diabetes mellitus without complications: Secondary | ICD-10-CM | POA: Diagnosis not present

## 2014-05-09 LAB — COMPREHENSIVE METABOLIC PANEL
ALBUMIN: 2.6 g/dL — AB (ref 3.5–5.2)
ALT: 15 U/L (ref 0–53)
AST: 24 U/L (ref 0–37)
Alkaline Phosphatase: 88 U/L (ref 39–117)
Anion gap: 4 — ABNORMAL LOW (ref 5–15)
BILIRUBIN TOTAL: 0.4 mg/dL (ref 0.3–1.2)
BUN: 7 mg/dL (ref 6–23)
CALCIUM: 8.5 mg/dL (ref 8.4–10.5)
CHLORIDE: 108 mmol/L (ref 96–112)
CO2: 30 mmol/L (ref 19–32)
Creatinine, Ser: 0.84 mg/dL (ref 0.50–1.35)
GFR, EST NON AFRICAN AMERICAN: 81 mL/min — AB (ref >90)
Glucose, Bld: 105 mg/dL — ABNORMAL HIGH (ref 70–99)
POTASSIUM: 3.5 mmol/L (ref 3.5–5.1)
Sodium: 142 mmol/L (ref 135–145)
Total Protein: 6.9 g/dL (ref 6.0–8.3)

## 2014-05-09 LAB — GLUCOSE, CAPILLARY
GLUCOSE-CAPILLARY: 97 mg/dL (ref 70–99)
GLUCOSE-CAPILLARY: 99 mg/dL (ref 70–99)
Glucose-Capillary: 106 mg/dL — ABNORMAL HIGH (ref 70–99)
Glucose-Capillary: 108 mg/dL — ABNORMAL HIGH (ref 70–99)
Glucose-Capillary: 99 mg/dL (ref 70–99)

## 2014-05-09 LAB — CBC
HCT: 35.1 % — ABNORMAL LOW (ref 39.0–52.0)
HEMOGLOBIN: 11.2 g/dL — AB (ref 13.0–17.0)
MCH: 27.5 pg (ref 26.0–34.0)
MCHC: 31.9 g/dL (ref 30.0–36.0)
MCV: 86.2 fL (ref 78.0–100.0)
PLATELETS: 190 10*3/uL (ref 150–400)
RBC: 4.07 MIL/uL — AB (ref 4.22–5.81)
RDW: 15.1 % (ref 11.5–15.5)
WBC: 7.9 10*3/uL (ref 4.0–10.5)

## 2014-05-09 LAB — VITAMIN B12: VITAMIN B 12: 335 pg/mL (ref 211–911)

## 2014-05-09 LAB — TSH: TSH: 2.981 u[IU]/mL (ref 0.350–4.500)

## 2014-05-09 LAB — SEDIMENTATION RATE: Sed Rate: 63 mm/hr — ABNORMAL HIGH (ref 0–16)

## 2014-05-09 MED ORDER — ASPIRIN EC 81 MG PO TBEC
81.0000 mg | DELAYED_RELEASE_TABLET | Freq: Every day | ORAL | Status: DC
  Administered 2014-05-09 – 2014-05-12 (×4): 81 mg via ORAL
  Filled 2014-05-09 (×4): qty 1

## 2014-05-09 MED ORDER — CYANOCOBALAMIN 500 MCG PO TABS
500.0000 ug | ORAL_TABLET | Freq: Every day | ORAL | Status: DC
  Administered 2014-05-09 – 2014-05-12 (×4): 500 ug via ORAL
  Filled 2014-05-09 (×4): qty 1

## 2014-05-09 MED ORDER — INSULIN ASPART 100 UNIT/ML ~~LOC~~ SOLN: 0.0000 [IU] | Freq: Every day | SUBCUTANEOUS | Status: DC

## 2014-05-09 MED ORDER — DONEPEZIL HCL 10 MG PO TABS
10.0000 mg | ORAL_TABLET | Freq: Every day | ORAL | Status: DC
  Administered 2014-05-09 – 2014-05-11 (×4): 10 mg via ORAL
  Filled 2014-05-09 (×5): qty 1

## 2014-05-09 MED ORDER — AMLODIPINE BESYLATE 5 MG PO TABS
5.0000 mg | ORAL_TABLET | Freq: Every day | ORAL | Status: DC
  Administered 2014-05-09 – 2014-05-12 (×4): 5 mg via ORAL
  Filled 2014-05-09 (×4): qty 1

## 2014-05-09 MED ORDER — ACETAMINOPHEN 325 MG PO TABS: 650.0000 mg | ORAL_TABLET | Freq: Four times a day (QID) | ORAL | Status: DC | PRN

## 2014-05-09 MED ORDER — SODIUM CHLORIDE 0.9 % IJ SOLN
3.0000 mL | Freq: Two times a day (BID) | INTRAMUSCULAR | Status: DC
  Administered 2014-05-09 – 2014-05-12 (×7): 3 mL via INTRAVENOUS

## 2014-05-09 MED ORDER — POTASSIUM CHLORIDE IN NACL 40-0.9 MEQ/L-% IV SOLN
INTRAVENOUS | Status: AC
  Administered 2014-05-09: 50 mL/h via INTRAVENOUS
  Administered 2014-05-09: 75 mL/h via INTRAVENOUS
  Filled 2014-05-09 (×2): qty 1000

## 2014-05-09 MED ORDER — ACETAMINOPHEN 650 MG RE SUPP: 650.0000 mg | Freq: Four times a day (QID) | RECTAL | Status: DC | PRN

## 2014-05-09 MED ORDER — QUETIAPINE FUMARATE 50 MG PO TABS
50.0000 mg | ORAL_TABLET | Freq: Three times a day (TID) | ORAL | Status: DC
  Administered 2014-05-09 – 2014-05-12 (×10): 50 mg via ORAL
  Filled 2014-05-09 (×12): qty 1

## 2014-05-09 MED ORDER — INSULIN ASPART 100 UNIT/ML ~~LOC~~ SOLN
0.0000 [IU] | Freq: Three times a day (TID) | SUBCUTANEOUS | Status: DC
  Administered 2014-05-12: 1 [IU] via SUBCUTANEOUS

## 2014-05-09 MED ORDER — ATORVASTATIN CALCIUM 40 MG PO TABS
40.0000 mg | ORAL_TABLET | Freq: Every day | ORAL | Status: DC
  Administered 2014-05-09 – 2014-05-12 (×4): 40 mg via ORAL
  Filled 2014-05-09 (×4): qty 1

## 2014-05-09 MED ORDER — BACLOFEN 5 MG HALF TABLET
5.0000 mg | ORAL_TABLET | Freq: Two times a day (BID) | ORAL | Status: DC
  Administered 2014-05-09 – 2014-05-12 (×8): 5 mg via ORAL
  Filled 2014-05-09 (×9): qty 1

## 2014-05-09 MED ORDER — ENOXAPARIN SODIUM 40 MG/0.4ML ~~LOC~~ SOLN
40.0000 mg | SUBCUTANEOUS | Status: DC
  Administered 2014-05-09 – 2014-05-12 (×4): 40 mg via SUBCUTANEOUS
  Filled 2014-05-09 (×4): qty 0.4

## 2014-05-09 NOTE — Progress Notes (Signed)
[facility] PROGRESS NOTE  Raymond AntisWillie Cogar Jr. ZOX:[id number] DOB: 12/30/1931 DOA: 05/08/2014 PCP: Raymond OraJose Paz, MD  Assessment/Plan: 1-Encephalopathy, FTT;  B 12 pending. TSH normal.  Check MRI.  Work up for infection negative. Negative UA, chest x ray.   2-History stroke with resultant right hemiparesis, rigidity. Continue with baclofen.  Follow up outpatient with Dr Pearlean BrownieSethi for Botox injection.  Continue with aspirin.   3-Dementia Cont aricept  Diabetes: SSI Hypokalemia; replaced.   Code Status: DNR Family Communication: care discussed with son Disposition Plan: to be dtermine   Consultants: None  Procedures:  none  Antibiotics:  none  HPI/Subjective: No complaints. Confuse.  Relates decrease appetitive.   Objective: Filed Vitals:   05/09/14 0554  BP: 150/73  Pulse: 79  Temp: 98.1 F (36.7 C)  Resp: 13    Intake/Output Summary (Last 24 hours) at 05/09/14 1352 Last data filed at 05/09/14 0239  Gross per 24 hour  Intake      3 ml  Output      0 ml  Net      3 ml   Filed Weights   05/09/14 0106 05/09/14 0554  Weight: 54.9 kg (121 lb 0.5 oz) 55 kg (121 lb 4.1 oz)    Exam:   General:  Alert in no distress.   Cardiovascular: S 1, S 2 RRR  Respiratory: CTA  Abdomen: bs present, soft, nt  Musculoskeletal: rigidity right side  Neuro; chronic left hemiparesis, rigidity. Alert follow command  Data Reviewed: Basic Metabolic Panel:  Recent Labs Lab 05/08/14 2045 05/09/14 0610  NA 139 142  K 3.1* 3.5  CL 105 108  CO2 25 30  GLUCOSE 137* 105*  BUN 9 7  CREATININE 0.89 0.84  CALCIUM 8.6 8.5   Liver Function Tests:  Recent Labs Lab 05/08/14 2045 05/09/14 0610  AST 30 24  ALT 18 15  ALKPHOS 106 88  BILITOT 0.7 0.4  PROT 7.3 6.9  ALBUMIN 3.0* 2.6*   No results for input(s): LIPASE, AMYLASE in the last 168 hours. No results for input(s): AMMONIA in the last 168 hours. CBC:  Recent Labs Lab 05/08/14 2045 05/09/14 0610  WBC  9.7 7.9  NEUTROABS 7.0  --   HGB 11.9* 11.2*  HCT 36.5* 35.1*  MCV 85.9 86.2  PLT 164 190   Cardiac Enzymes: No results for input(s): CKTOTAL, CKMB, CKMBINDEX, TROPONINI in the last 168 hours. BNP (last 3 results) No results for input(s): PROBNP in the last 8760 hours. CBG:  Recent Labs Lab 05/09/14 0137 05/09/14 0737 05/09/14 1233  GLUCAP 97 106* 108*    No results found for this or any previous visit (from the past 240 hour(s)).   Studies: Dg Chest 2 View  05/08/2014   CLINICAL DATA:  Weakness and loss of appetite for several days.  EXAM: CHEST  2 VIEW  COMPARISON:  Single view of the chest 07/06/2013 and 02/13/2013.  FINDINGS: Mild left basilar atelectasis is noted. The lungs are otherwise clear. No pneumothorax or pleural effusion. Heart size is normal. Postoperative change both shoulders is noted.  IMPRESSION: No acute disease.   Electronically Signed   By: Drusilla Kannerhomas  Dalessio M.D.   On: 05/08/2014 21:19   Dg Pelvis 1-2 Views  05/08/2014   CLINICAL DATA:  Pelvic pain and weakness.  EXAM: PELVIS - 1-2 VIEW  COMPARISON:  Abdomen 04/06/2009  FINDINGS: There is no evidence of pelvic fracture or diastasis. No pelvic bone lesions are seen. Evaluation of the sacrum is limited due  to overlying bowel gas. Degenerative changes in the lower lumbar spine and hips.  IMPRESSION: Degenerative changes in the lower lumbar spine and hips. No acute bony abnormalities.   Electronically Signed   By: Burman Nieves M.D.   On: 05/08/2014 23:53   Ct Head Wo Contrast  05/08/2014   CLINICAL DATA:  Weakness and loss of appetite for several days. Right-sided weakness.  EXAM: CT HEAD WITHOUT CONTRAST  TECHNIQUE: Contiguous axial images were obtained from the base of the skull through the vertex without intravenous contrast.  COMPARISON:  02/14/2013  FINDINGS: Diffuse cerebral atrophy. Prominent ventricular dilatation consistent with central atrophy. Low-attenuation change throughout the deep white matter  consistent small vessel ischemia. Old lacunar infarcts in the deep white matter. Focal old area of encephalomalacia in the left occipital region. No significant change since prior study. No mass effect or midline shift. No abnormal extra-axial fluid collections. Gray-white matter junctions are distinct. Basal cisterns are not effaced. No evidence of acute intracranial hemorrhage. No depressed skull fractures. Mucosal thickening and partial opacification in the left maxillary antrum. Small retention cyst in the right maxillary antrum. Vascular calcifications. Mastoid air cells are not opacified.  IMPRESSION: No acute intracranial abnormalities. Chronic atrophy and small vessel ischemic changes with multiple old lacunar infarcts and old infarct in the left occipital region. Inflammatory changes in the paranasal sinuses.   Electronically Signed   By: Burman Nieves M.D.   On: 05/08/2014 22:06    Scheduled Meds: . aspirin EC  81 mg Oral Daily  . atorvastatin  40 mg Oral q1800  . baclofen  5 mg Oral BID  . cyanocobalamin  500 mcg Oral Daily  . donepezil  10 mg Oral QHS  . enoxaparin (LOVENOX) injection  40 mg Subcutaneous Q24H  . insulin aspart  0-5 Units Subcutaneous QHS  . insulin aspart  0-9 Units Subcutaneous TID WC  . QUEtiapine  50 mg Oral TID  . sodium chloride  3 mL Intravenous Q12H   Continuous Infusions: . 0.9 % NaCl with KCl 40 mEq / L 75 mL/hr (05/09/14 0239)    Active Problems:   Dementia   Diabetes mellitus, type 2   Ataxia, late effect of cerebrovascular disease   Altered mental status   Weakness    Time spent: 35 minutes.     Hartley Barefoot A  [facility] Pager [phone] If 7PM-7AM, please contact night-coverage at [email or web address], password [facility] 05/09/2014, 1:52 PM  LOS: 1 day

## 2014-05-09 NOTE — Progress Notes (Signed)
Raymond AntisWillie Theisen Jr. [id number] Code Status: DNR   Admission Data: 05/09/2014 5:17 AM Attending Provider:  Pixie CasinoKim, J MD WUJ:WJXBPCP:Jose Drue NovelPaz, MD Consults/ Treatment Team:    Raymond AntisWillie Lindsley Jr. is a 81 y.o. male patient admitted from ED awake, alert - oriented  X 1 - no acute distress noted.  VSS - Blood pressure 131/65, pulse 77, temperature 98.5 F (36.9 C), temperature source Oral, resp. rate 24, height 5\' 8"  (1.727 m), weight 54.9 kg (121 lb 0.5 oz), SpO2 99 %.    IV in place, occlusive dsg intact without redness.  Orientation to room, and floor completed with information packet given to patient/family.  Patient declined safety video at this time.  Admission INP armband ID verified with patient/family, and in place.   SR up x 2, fall assessment complete, with patient and family able to verbalize understanding of risk associated with falls, and verbalized understanding to call nsg before up out of bed.  Call light within reach, patient able to voice, and demonstrate understanding.  Skin, clean-dry- intact without evidence of bruising, or skin tears.   No evidence of skin break down noted on exam.     Will cont to eval and treat per MD orders.  Sharilyn SitesJeter, Tallula Grindle M, RN 05/09/2014 5:17 AM

## 2014-05-09 NOTE — Evaluation (Signed)
Occupational Therapy Evaluation Patient Details Name: Raymond AntisWillie Giambra Jr. MRN: [id number] DOB: 06/21/1931 Today's Date: 05/09/2014    History of Present Illness Pt is a 81 y.o. Male admitted 05/08/14 with confusion, generalized weakness, and FTT for several days. CT negative for acute abnormalities. Chronic atrophy and small vessel ischemic changes with multiple old lacunar infarcts and old infarct in the left occipital region.  PMH: CVA, Dementia, EtOH, DM, DJD.    Clinical Impression   Pt unable to report PLOF. Pt with decreased ability to follow commands and currently requires max (A) for ADLs and functional mobility. Pending progress, pt may need SNF at d/c. Pt will benefit from acute OT to address ADLs and functional transfers.     Follow Up Recommendations  SNF;Supervision/Assistance - 24 hour (pending progress)    Equipment Recommendations  None recommended by OT    Recommendations for Other Services       Precautions / Restrictions Precautions Precautions: Fall Restrictions Weight Bearing Restrictions: No      Mobility Bed Mobility Overal bed mobility: Needs Assistance Bed Mobility: Supine to Sit     Supine to sit: Mod assist     General bed mobility comments: Mod A to bring LEs off bed and elevate trunk off bed. Pt had difficulty maintaining EOB sitting balance without support.   Transfers Overall transfer level: Needs assistance Equipment used: None Transfers: Sit to/from UGI CorporationStand;Stand Pivot Transfers Sit to Stand: Max assist Stand pivot transfers: Total assist       General transfer comment: Max (A) to rise and total (A) to pivot to recliner chair. Pt with difficulty following directions and has posterior lean.          ADL Overall ADL's : Needs assistance/impaired     Grooming: Set up;Bed level;Wash/dry face Grooming Details (indicate cue type and reason): setup with VC's Upper Body Bathing: Maximal assistance;Sitting   Lower Body Bathing: Maximal  assistance;Sit to/from stand;+2 for physical assistance   Upper Body Dressing : Sitting;Total assistance   Lower Body Dressing: Total assistance;+2 for physical assistance;Sit to/from stand   Toilet Transfer: Stand-pivot;Total assistance Toilet Transfer Details (indicate cue type and reason): gait belt assisted transfer from bed>recliner. Max (A) to stand. Pt with difficulty following commands.            General ADL Comments: Pt with difficulty following commands. Requires max (A) for ADLs and transfers at this time.      Vision                 Additional Comments: Unable to assess due to cognition.          Pertinent Vitals/Pain Pain Assessment: No/denies pain     Hand Dominance Right   Extremity/Trunk Assessment Upper Extremity Assessment Upper Extremity Assessment: RUE deficits/detail RUE Deficits / Details: difficult to assess due to decreased cognition. Pt with minimal ROM, increased tone, holds against his body.    Lower Extremity Assessment Lower Extremity Assessment: Defer to PT evaluation   Cervical / Trunk Assessment Cervical / Trunk Assessment: Normal   Communication Communication Communication: No difficulties   Cognition Arousal/Alertness: Awake/alert Behavior During Therapy: Flat affect Overall Cognitive Status: No family/caregiver present to determine baseline cognitive functioning                                Home Living Family/patient expects to be discharged to:: Unsure  Additional Comments: pt unable to provide history or PLOF      Prior Functioning/Environment          Comments: Pt unable to provide information    OT Diagnosis: Generalized weakness;Cognitive deficits;Altered mental status   OT Problem List: Impaired balance (sitting and/or standing);Decreased cognition   OT Treatment/Interventions: Self-care/ADL training;Therapeutic exercise;Energy conservation;DME  and/or AE instruction;Therapeutic activities;Patient/family education;Balance training    OT Goals(Current goals can be found in the care plan section) Acute Rehab OT Goals Patient Stated Goal: none stated OT Goal Formulation: Patient unable to participate in goal setting Time For Goal Achievement: 05/23/14 Potential to Achieve Goals: Good ADL Goals Pt Will Perform Grooming: with supervision;standing Pt Will Transfer to Toilet: with supervision;ambulating Pt Will Perform Toileting - Clothing Manipulation and hygiene: with supervision;sit to/from stand  OT Frequency: Min 2X/week    End of Session Equipment Utilized During Treatment: Gait belt  Activity Tolerance: Patient tolerated treatment well Patient left: in chair;with call bell/phone within reach;with chair alarm set   Time: 1610-9604 OT Time Calculation (min): 19 min Charges:  OT General Charges $OT Visit: 1 Procedure OT Evaluation $Initial OT Evaluation Tier I: 1 Procedure G-Codes: OT G-codes **NOT FOR INPATIENT CLASS** Functional Assessment Tool Used: clinical judgment Functional Limitation: Self care Self Care Current Status (V4098): At least 60 percent but less than 80 percent impaired, limited or restricted Self Care Goal Status (J1914): At least 20 percent but less than 40 percent impaired, limited or restricted  Rae Lips 05/09/2014, 5:39 PM  Carney Living, OTR/L Occupational Therapist [phone] (pager)

## 2014-05-09 NOTE — Evaluation (Signed)
Clinical/Bedside Swallow Evaluation Patient Details  Name: Raymond Rangel. MRN: [id number] Date of Birth: 12/20/1931  Today's Date: 05/09/2014 Time: SLP Start Time (ACUTE ONLY): 1357 SLP Stop Time (ACUTE ONLY): 1410 SLP Time Calculation (min) (ACUTE ONLY): 13 min  Past Medical History:  Past Medical History  Diagnosis Date  . Stroke 04-2011  . Irregular heart beat   . Dementia   . DVT (deep venous thrombosis)     L leg 04-2011  . Hypercholesteremia   . Arthritis   . Diabetes mellitus   . Infectious colitis 08/07/2012  . CAD (coronary artery disease)     stent 2005  . DJD (degenerative joint disease)   . H/O ETOH abuse   . Recurrent UTI     UTI, MS changes admited 02-2013   Past Surgical History:  Past Surgical History  Procedure Laterality Date  . Left arm    . Rotator cuff repair  2000(L) 1999 (R)   HPI:  81 yo male with hx of CVA, dementia, ETOH, DM, degenerative joint disease admitted with confusion, generalized weakness and FTT for the past couple of days. Pt found to be hypokalemic. CT no acute intracranial abnormalities. Chronic atrophy and small vessel ischemic changes with multiple old lacunar infarcts and old infarct in the left occipital region. CXR no acute disease. MBS 08/09/12 revealing mild cervical esophageal phase (? osteophytes, decreased UES patency) and oropharyngeal dysphagia. Delayed initiation, mild residue, no penetration/aspiration. Regular/thin recommended.   Assessment / Plan / Recommendation Clinical Impression  Pt' s wife reported no difficulties with appetite or swallow function prior to admission and prepares a soft texture at home. Oral and oropharyngeal swallow function WFL's. SLP educated/reviewed prior MBS results from 2014 and swallow strategies to mitigate difficulty from cervical esophageal dysphagia and potential challenges with po intake as dementia progresses. Recommend continue regular texture (do not suspect significant difficulty  masticating), thin liquids, straws allowed and full supervision due to prior impulsivity with po's. No f/u presently needed.    Aspiration Risk  Moderate    Diet Recommendation Regular;Thin liquid   Liquid Administration via: Cup;Straw Medication Administration: Whole meds with liquid Supervision: Full supervision/cueing for compensatory strategies;Staff to assist with self feeding Compensations: Slow rate;Small sips/bites Postural Changes and/or Swallow Maneuvers: Seated upright 90 degrees;Upright 30-60 min after meal    Other  Recommendations Oral Care Recommendations: Oral care BID   Follow Up Recommendations  None    Frequency and Duration        Pertinent Vitals/Pain No pain observed         Swallow Study          Oral/Motor/Sensory Function Overall Oral Motor/Sensory Function: Appears within functional limits for tasks assessed   Ice Chips Ice chips: Not tested   Thin Liquid Thin Liquid: Within functional limits Presentation: Straw    Nectar Thick Nectar Thick Liquid: Not tested   Honey Thick Honey Thick Liquid: Not tested   Puree Puree: Within functional limits   Solid   GO Functional Assessment Tool Used: skilled clinical judgement Functional Limitations: Swallowing Swallow Current Status ([id number]): At least 20 percent but less than 40 percent impaired, limited or restricted Swallow Goal Status [phone]): At least 20 percent but less than 40 percent impaired, limited or restricted Swallow Discharge Status [phone]): At least 20 percent but less than 40 percent impaired, limited or restricted  Solid: Within functional limits       Roque CashLitaker, Breck CoonsLisa Willis 05/09/2014,2:25 PM  Breck CoonsLisa Willis Lonell FaceLitaker M.Ed [employer] Pager [phone]

## 2014-05-09 NOTE — Progress Notes (Signed)
UR completed 

## 2014-05-10 DIAGNOSIS — E11319 Type 2 diabetes mellitus with unspecified diabetic retinopathy without macular edema: Secondary | ICD-10-CM | POA: Diagnosis not present

## 2014-05-10 DIAGNOSIS — R531 Weakness: Secondary | ICD-10-CM | POA: Diagnosis not present

## 2014-05-10 DIAGNOSIS — E11311 Type 2 diabetes mellitus with unspecified diabetic retinopathy with macular edema: Secondary | ICD-10-CM

## 2014-05-10 DIAGNOSIS — R410 Disorientation, unspecified: Secondary | ICD-10-CM | POA: Diagnosis not present

## 2014-05-10 DIAGNOSIS — F0390 Unspecified dementia without behavioral disturbance: Secondary | ICD-10-CM | POA: Diagnosis not present

## 2014-05-10 LAB — GLUCOSE, CAPILLARY
GLUCOSE-CAPILLARY: 108 mg/dL — AB (ref 70–99)
Glucose-Capillary: 94 mg/dL (ref 70–99)
Glucose-Capillary: 107 mg/dL — ABNORMAL HIGH (ref 70–99)
Glucose-Capillary: 107 mg/dL — ABNORMAL HIGH (ref 70–99)

## 2014-05-10 LAB — BASIC METABOLIC PANEL WITH GFR
Anion gap: 8 (ref 5–15)
BUN: 6 mg/dL (ref 6–23)
CO2: 24 mmol/L (ref 19–32)
Calcium: 8.2 mg/dL — ABNORMAL LOW (ref 8.4–10.5)
Chloride: 106 mmol/L (ref 96–112)
Creatinine, Ser: 0.74 mg/dL (ref 0.50–1.35)
GFR calc Af Amer: >90 mL/min
GFR calc non Af Amer: 85 mL/min — ABNORMAL LOW
Glucose, Bld: 94 mg/dL (ref 70–99)
Potassium: 3.5 mmol/L (ref 3.5–5.1)
Sodium: 138 mmol/L (ref 135–145)

## 2014-05-10 LAB — HEMOGLOBIN A1C
Hgb A1c MFr Bld: 6.6 % — ABNORMAL HIGH (ref 4.8–5.6)
MEAN PLASMA GLUCOSE: 143 mg/dL

## 2014-05-10 MED ORDER — GLUCERNA SHAKE PO LIQD
237.0000 mL | Freq: Three times a day (TID) | ORAL | Status: DC
  Administered 2014-05-10 – 2014-05-12 (×7): 237 mL via ORAL

## 2014-05-10 NOTE — Progress Notes (Signed)
Physical Therapy Treatment Patient Details Name: Raymond AntisWillie Hassebrock Jr. MRN: [id number] DOB: 01/20/1932 Today's Date: 05/10/2014    History of Present Illness Pt is a 81 y.o. Male admitted 05/08/14 with confusion, generalized weakness, and FTT for several days. CT negative for acute abnormalities. Chronic atrophy and small vessel ischemic changes with multiple old lacunar infarcts and old infarct in the left occipital region.  PMH: CVA, Dementia, EtOH, DM, DJD.     PT Comments    Assist nursing back to bed.  Daughter present, reports baseline function at dependent care, but was able to perform simple tasks with L hand (feeding, shaving (electric razor)).  L hand and leg function changed in past few days.  Family would like to take him back home again after hospital.   Follow Up Recommendations  SNF;Supervision/Assistance - 24 hour     Equipment Recommendations       Recommendations for Other Services       Precautions / Restrictions Precautions Precautions: Fall Restrictions Weight Bearing Restrictions: No    Mobility  Bed Mobility Overal bed mobility: Needs Assistance Bed Mobility: Supine to Sit     Supine to sit: Mod assist     General bed mobility comments: Mod A to bring LEs off bed and elevate trunk off bed. Pt had difficulty maintaining EOB sitting balance without support.   Transfers Overall transfer level: Needs assistance Equipment used: 1 person hand held assist   Sit to Stand: Max assist Stand pivot transfers: Total assist       General transfer comment: Max (A) to rise and total (A) to pivot to recliner chair. Pt with difficulty following directions and has posterior lean.   Ambulation/Gait Ambulation/Gait assistance: Total assist               Stairs            Wheelchair Mobility    Modified Rankin (Stroke Patients Only)       Balance     Sitting balance-Leahy Scale: Poor       Standing balance-Leahy Scale: Zero                       Cognition Arousal/Alertness: Awake/alert Behavior During Therapy: Flat affect Overall Cognitive Status: History of cognitive impairments - at baseline                      Exercises      General Comments        Pertinent Vitals/Pain      Home Living Family/patient expects to be discharged to:: Private residence Living Arrangements: Spouse/significant other Available Help at Discharge: Family           Additional Comments: Daughter reports dependent care at home, transfers only to w/c.  Spouse primary caregiver, family assists, some have medical training.     Prior Function Level of Independence: Needs assistance  Gait / Transfers Assistance Needed: Max/Dependent   Comments: Dependent care at home with spouse/family   PT Goals (current goals can now be found in the care plan section) Acute Rehab PT Goals Patient Stated Goal: none stated Time For Goal Achievement: 05/24/14 Potential to Achieve Goals: Fair    Frequency  Min 3X/week    PT Plan      Co-evaluation             End of Session Equipment Utilized During Treatment: Gait belt Activity Tolerance: Patient tolerated treatment well Patient left: in bed;with call  bell/phone within reach;with nursing/sitter in room;with family/visitor present     Time: 1530-1540 PT Time Calculation (min) (ACUTE ONLY): 10 min  Charges:  $Therapeutic Activity: 8-22 mins                    G Codes:      Andrzej Scully L May 12, 2014, 3:48 PM

## 2014-05-10 NOTE — Progress Notes (Signed)
UR completed 

## 2014-05-10 NOTE — Progress Notes (Signed)
[facility] PROGRESS NOTE  Raymond AntisWillie Bloodworth Jr. [id number] DOB: 04/25/1931 DOA: 05/08/2014 PCP: Raymond Rangel  Assessment/Plan: 1-Encephalopathy, FTT;  B 12: 335 . TSH normal.  MRI negative for acute stroke.  Work up for infection negative. Negative UA, chest x ray.  ANA, RPR pending.   2-History stroke with resultant right hemiparesis, rigidity. Continue with baclofen.  Follow up outpatient with Raymond Rangel for Botox injection.  Continue with aspirin.   3-Dementia Cont aricept  Diabetes: SSI Hypokalemia; replaced.   Code Status: DNR Family Communication: care discussed with son Disposition Plan: to be dtermine, PT recommending SNF, SW consulted.    Consultants: None  Procedures:  none  Antibiotics:  none  HPI/Subjective: No complaints. Confuse.  Relates decrease appetitive.   Objective: Filed Vitals:   05/10/14 1304  BP: 126/62  Pulse: 57  Temp: 97.7 F (36.5 C)  Resp: 20    Intake/Output Summary (Last 24 hours) at 05/10/14 1406 Last data filed at 05/09/14 2223  Gross per 24 hour  Intake    778 ml  Output    300 ml  Net    478 ml   Filed Weights   05/09/14 0106 05/09/14 0554 05/10/14 0500  Weight: 54.9 kg (121 lb 0.5 oz) 55 kg (121 lb 4.1 oz) 55.7 kg (122 lb 12.7 oz)    Exam:   General:  Alert in no distress.   Cardiovascular: S 1, S 2 RRR  Respiratory: CTA  Abdomen: bs present, soft, nt  Musculoskeletal: rigidity right side  Neuro; chronic left hemiparesis, rigidity. Alert follow command  Data Reviewed: Basic Metabolic Panel:  Recent Labs Lab 05/08/14 2045 05/09/14 0610 05/10/14 0525  NA 139 142 138  K 3.1* 3.5 3.5  CL 105 108 106  CO2 25 30 24   GLUCOSE 137* 105* 94  BUN 9 7 6   CREATININE 0.89 0.84 0.74  CALCIUM 8.6 8.5 8.2*   Liver Function Tests:  Recent Labs Lab 05/08/14 2045 05/09/14 0610  AST 30 24  ALT 18 15  ALKPHOS 106 88  BILITOT 0.7 0.4  PROT 7.3 6.9  ALBUMIN 3.0* 2.6*   No results for input(s):  LIPASE, AMYLASE in the last 168 hours. No results for input(s): AMMONIA in the last 168 hours. CBC:  Recent Labs Lab 05/08/14 2045 05/09/14 0610  WBC 9.7 7.9  NEUTROABS 7.0  --   HGB 11.9* 11.2*  HCT 36.5* 35.1*  MCV 85.9 86.2  PLT 164 190   Cardiac Enzymes: No results for input(s): CKTOTAL, CKMB, CKMBINDEX, TROPONINI in the last 168 hours. BNP (last 3 results) No results for input(s): PROBNP in the last 8760 hours. CBG:  Recent Labs Lab 05/09/14 1233 05/09/14 1718 05/09/14 2159 05/10/14 0757 05/10/14 1150  GLUCAP 108* 99 99 94 107*    No results found for this or any previous visit (from the past 240 hour(s)).   Studies: Dg Chest 2 View  05/08/2014   CLINICAL DATA:  Weakness and loss of appetite for several days.  EXAM: CHEST  2 VIEW  COMPARISON:  Single view of the chest 07/06/2013 and 02/13/2013.  FINDINGS: Mild left basilar atelectasis is noted. The lungs are otherwise clear. No pneumothorax or pleural effusion. Heart size is normal. Postoperative change both shoulders is noted.  IMPRESSION: No acute disease.   Electronically Signed   By: Drusilla Kannerhomas  Dalessio M.D.   On: 05/08/2014 21:19   Dg Pelvis 1-2 Views  05/08/2014   CLINICAL DATA:  Pelvic pain and weakness.  EXAM: PELVIS -  1-2 VIEW  COMPARISON:  Abdomen 04/06/2009  FINDINGS: There is no evidence of pelvic fracture or diastasis. No pelvic bone lesions are seen. Evaluation of the sacrum is limited due to overlying bowel gas. Degenerative changes in the lower lumbar spine and hips.  IMPRESSION: Degenerative changes in the lower lumbar spine and hips. No acute bony abnormalities.   Electronically Signed   By: Burman Nieves M.D.   On: 05/08/2014 23:53   Ct Head Wo Contrast  05/08/2014   CLINICAL DATA:  Weakness and loss of appetite for several days. Right-sided weakness.  EXAM: CT HEAD WITHOUT CONTRAST  TECHNIQUE: Contiguous axial images were obtained from the base of the skull through the vertex without intravenous  contrast.  COMPARISON:  02/14/2013  FINDINGS: Diffuse cerebral atrophy. Prominent ventricular dilatation consistent with central atrophy. Low-attenuation change throughout the deep white matter consistent small vessel ischemia. Old lacunar infarcts in the deep white matter. Focal old area of encephalomalacia in the left occipital region. No significant change since prior study. No mass effect or midline shift. No abnormal extra-axial fluid collections. Gray-white matter junctions are distinct. Basal cisterns are not effaced. No evidence of acute intracranial hemorrhage. No depressed skull fractures. Mucosal thickening and partial opacification in the left maxillary antrum. Small retention cyst in the right maxillary antrum. Vascular calcifications. Mastoid air cells are not opacified.  IMPRESSION: No acute intracranial abnormalities. Chronic atrophy and small vessel ischemic changes with multiple old lacunar infarcts and old infarct in the left occipital region. Inflammatory changes in the paranasal sinuses.   Electronically Signed   By: Burman Nieves M.D.   On: 05/08/2014 22:06   Mr Brain Wo Contrast  05/09/2014   CLINICAL DATA:  Encephalopathy, stroke with resultant RIGHT hemi paresis. History of dementia, stroke, diabetes.  EXAM: MRI HEAD WITHOUT CONTRAST  TECHNIQUE: Multiplanar, multiecho pulse sequences of the brain and surrounding structures were obtained without intravenous contrast.  COMPARISON:  CT of the head May 08, 2014 and MRI of the head November 24, 2012  FINDINGS: Mildly motion degraded examination. No reduced diffusion to suggest acute ischemia. No susceptibility artifact to suggest hemorrhage; LEFT corona radiata developmental venous anomaly suspected.  Moderate to severe ventriculomegaly, predominantly on the basis of global parenchymal brain volume loss as there is overall commensurate enlargement of the cerebral sulci and cerebellar folia. LEFT mesial occipital lobe encephalomalacia with  ex vacuo dilatation the LEFT occipital horn. Remote bilateral thalamus, bilateral basal ganglial lacunar infarcts. Confluent predominately periventricular white matter T2 hyperintensities. Small LEFT greater than RIGHT cerebellar remote infarcts.  No abnormal extra-axial fluid collections. Normal major intracranial vascular flow voids seen at the skull base.  Near complete opacification LEFT maxillary sinus with air fluid level. LEFT ethmoid mucosal thickening. Ocular globes and orbital contents are unremarkable. Mastoid air cells are well aerated. No abnormal sellar expansion. No cerebellar tonsillar ectopia. Severe degenerative change of the included cervical spine though, motion degrades sensitivity.  IMPRESSION: No acute intracranial process.  Specifically, no stroke.  Relatively stable appearance of the head from November 24, 2012: Moderate to severe global brain atrophy. Remote basal ganglia and thalamus lacunar infarcts.  Remote LEFT posterior cerebral artery territory infarct. Remote small cerebellar infarcts. Moderate white matter changes can be seen with chronic small vessel ischemic disease.  Acute on chronic sinusitis.   Electronically Signed   By: Awilda Metro   On: 05/09/2014 21:10    Scheduled Meds: . amLODipine  5 mg Oral Daily  . aspirin EC  81 mg  Oral Daily  . atorvastatin  40 mg Oral q1800  . baclofen  5 mg Oral BID  . cyanocobalamin  500 mcg Oral Daily  . donepezil  10 mg Oral QHS  . enoxaparin (LOVENOX) injection  40 mg Subcutaneous Q24H  . feeding supplement (GLUCERNA SHAKE)  237 mL Oral TID BM  . insulin aspart  0-5 Units Subcutaneous QHS  . insulin aspart  0-9 Units Subcutaneous TID WC  . QUEtiapine  50 mg Oral TID  . sodium chloride  3 mL Intravenous Q12H   Continuous Infusions:    Active Problems:   Dementia   Diabetes mellitus, type 2   Ataxia, late effect of cerebrovascular disease   Altered mental status   Weakness    Time spent: 35 minutes.      Hartley Barefoot A  [facility] Pager [phone] If 7PM-7AM, please contact night-coverage at [email or web address], password [facility] 05/10/2014, 2:06 PM  LOS: 2 days

## 2014-05-10 NOTE — Progress Notes (Signed)
INITIAL NUTRITION ASSESSMENT  DOCUMENTATION CODES Per approved criteria  -Severe malnutrition in the context of chronic illness   INTERVENTION: 1.  Supplements;  Glucerna Shake po TID, each supplement provides 220 kcal and 10 grams of protein 2.  General healthful diet; encourage intake of foods and beverages as able.  RD to follow and assess for nutritional adequacy.  3.  Nutrition-related medications; pt's son asks about appetite stimulant.  Recommended discussion to be had with medical team once patient's baseline is assessed.    NUTRITION DIAGNOSIS: Inadequate oral intake related to anorexia as evidenced by family report.   Monitor:  1.  Food/Beverage; pt meeting >/=90% estimated needs with tolerance. 2.  Wt/wt change; monitor trends  Reason for Assessment: MST  81 y.o. male  Admitting Dx: AMS, FTT  ASSESSMENT: Patient admitted with AMS and FTT.  Patient and family report decreased LOC and appetite over the past few days.  Weight is currently stable, however note patient with chronic low weight.    RD met with patient and son at bedside.  Patient is currently lethargic.  Son is attempting to feed patient.  Patient accepting some food, however RD notes pocketing of bolus when asked to open mouth.  Patient has been assessed by SLP who determined patient appropriate for Dysphagia 3, thin liquids. Discussed this with son at bedside. Son also asked about an appetite stimulant for patient.  Discussed this as well and will note in recommendations son's request.  Discussed role of appetite stimulants in acute management of anorexia. Note patient with dementia and FTT.  Addressed son's immediate concerns re: adequate nutrition and will place orders.  Also placed recommendations in chart to be considered based on patient goals of care.  Also discussed patient's baseline which per son is with appropriate response, able to converse, and eating well with assistance.   Nutrition Focused  Physical Exam: Subcutaneous Fat:  Orbital Region: mild wasting Upper Arm Region: severe wasting Thoracic and Lumbar Region: severe wasting  Muscle:  Temple Region: severe wasting Clavicle Bone Region: severe wasting Clavicle and Acromion Bone Region: severe wasting Scapular Bone Region: severe wasting Dorsal Hand: severe wasting Patellar Region: not assessed Anterior Thigh Region: notassessed Posterior Calf Region: not assessed  Edema: none present  Pt meets criteria for severe MALNUTRITION in the context of chronic illness as evidenced by severe muscle and fat wasting, poor PO PTA.   Height: Ht Readings from Last 1 Encounters:  05/09/14 $RemoveB'5\' 8"'[id number]$  (1.727 m)    Weight: Wt Readings from Last 1 Encounters:  05/10/14 122 lb 12.7 oz (55.7 kg)    Ideal Body Weight: 70 kg  % Ideal Body Weight: 79%  Wt Readings from Last 10 Encounters:  05/10/14 122 lb 12.7 oz (55.7 kg)  02/14/13 132 lb 9.6 oz (60.147 kg)  01/01/13 128 lb (58.06 kg)  12/11/12 127 lb (57.607 kg)  11/29/12 126 lb (57.153 kg)  11/26/12 123 lb 6.4 oz (55.974 kg)  03/22/13 125 lb (56.7 kg)  08/16/12 125 lb (56.7 kg)  08/09/12 135 lb 9.3 oz (61.5 kg)  07/06/12 136 lb (61.689 kg)    Usual Body Weight: 130 lbs, last known weight from 2014  % Usual Body Weight: 93%  BMI:  Body mass index is 18.68 kg/(m^2).  Estimated Nutritional Needs: Kcal: 1500-1650 Protein: 50-60g Fluid: >1.8 L/day  Skin: intact  Diet Order: Diet heart healthy/carb modified  EDUCATION NEEDS: -Education needs addressed   Intake/Output Summary (Last 24 hours) at 05/10/14 1201 Last data filed  at 05/09/14 2223  Gross per 24 hour  Intake    778 ml  Output    300 ml  Net    478 ml    Last BM: 1/30  Labs:   Recent Labs Lab 05/08/14 2045 05/09/14 0610 05/10/14 0525  NA 139 142 138  K 3.1* 3.5 3.5  CL 105 108 106  CO2 $Re'25 30 24  'LCP$ BUN $R'9 7 6  'Ur$ CREATININE 0.89 0.84 0.74  CALCIUM 8.6 8.5 8.2*  GLUCOSE 137* 105* 94    CBG  (last 3)   Recent Labs  05/09/14 2159 05/10/14 0757 05/10/14 1150  GLUCAP 99 94 107*    Scheduled Meds: . amLODipine  5 mg Oral Daily  . aspirin EC  81 mg Oral Daily  . atorvastatin  40 mg Oral q1800  . baclofen  5 mg Oral BID  . cyanocobalamin  500 mcg Oral Daily  . donepezil  10 mg Oral QHS  . enoxaparin (LOVENOX) injection  40 mg Subcutaneous Q24H  . insulin aspart  0-5 Units Subcutaneous QHS  . insulin aspart  0-9 Units Subcutaneous TID WC  . QUEtiapine  50 mg Oral TID  . sodium chloride  3 mL Intravenous Q12H    Continuous Infusions:   Past Medical History  Diagnosis Date  . Stroke 04-2011  . Irregular heart beat   . Dementia   . DVT (deep venous thrombosis)     L leg 04-2011  . Hypercholesteremia   . Arthritis   . Diabetes mellitus   . Infectious colitis 08/07/2012  . CAD (coronary artery disease)     stent 2005  . DJD (degenerative joint disease)   . H/O ETOH abuse   . Recurrent UTI     UTI, MS changes admited 02-2013    Past Surgical History  Procedure Laterality Date  . Left arm    . Rotator cuff repair  2000(L) 1999 (R)    Brynda Greathouse, MS RD LDN Clinical Inpatient Dietitian Weekend/After hours pager: [phone]

## 2014-05-10 NOTE — Progress Notes (Signed)
Physical Therapy Evaluation Patient Details Name: Raymond Rangel. MRN: [id number] DOB: [birth year] Today's Date: 05/10/2014   History of Present Illness  Pt is a 81 y.o. Male admitted 05/08/14 with confusion, generalized weakness, and FTT for several days. CT negative for acute abnormalities. Chronic atrophy and small vessel ischemic changes with multiple old lacunar infarcts and old infarct in the left occipital region.  PMH: CVA, Dementia, EtOH, DM, DJD.   Clinical Impression  Patient unable to provide functional history, family not present to know prior level and plan for care.  Currently patient is pleasantly confused, with R side hemiparesis, requiring Max/Total assistance to transfer to bedside chair, unable to ambulate.  Patient may require skilled nursing stay depending on family ability to care for patient and baseline function.  Patient is appropriate to continue with skilled PT services for transfers and balance to decrease burden of care.    Follow Up Recommendations SNF;Supervision/Assistance - 24 hour    Equipment Recommendations       Recommendations for Other Services       Precautions / Restrictions Precautions Precautions: Fall Restrictions Weight Bearing Restrictions: No      Mobility  Bed Mobility Overal bed mobility: Needs Assistance Bed Mobility: Supine to Sit     Supine to sit: Mod assist     General bed mobility comments: Mod A to bring LEs off bed and elevate trunk off bed. Pt had difficulty maintaining EOB sitting balance without support.   Transfers Overall transfer level: Needs assistance Equipment used: 1 person hand held assist Transfers: Sit to/from [employer] Sit to Stand: Max assist Stand pivot transfers: Total assist       General transfer comment: Max (A) to rise and total (A) to pivot to recliner chair. Pt with difficulty following directions and has posterior lean.   Ambulation/Gait Ambulation/Gait assistance: Total  assist              Stairs            Wheelchair Mobility    Modified Rankin (Stroke Patients Only)       Balance Overall balance assessment: Needs assistance Sitting-balance support: Single extremity supported Sitting balance-Leahy Scale: Poor       Standing balance-Leahy Scale: Zero Standing balance comment: Extension tone increases, feet slide forward with knees extended.                             Pertinent Vitals/Pain Pain Assessment: No/denies pain    Home Living Family/patient expects to be discharged to:: Unsure Living Arrangements: Spouse/significant other               Additional Comments: pt unable to provide history or PLOF    Prior Function Level of Independence: Needs assistance   Gait / Transfers Assistance Needed: Max/Dependent     Comments: Pt unable to provide information     Hand Dominance   Dominant Hand: Right    Extremity/Trunk Assessment   Upper Extremity Assessment: Defer to OT evaluation;Difficult to assess due to impaired cognition RUE Deficits / Details: difficult to assess due to decreased cognition. Pt with minimal ROM, increased tone, holds against his body.          Lower Extremity Assessment: RLE deficits/detail;Difficult to assess due to impaired cognition RLE Deficits / Details: Residual from CVA, LE held mostly in extension    Cervical / Trunk Assessment: Normal (extension tone increases during transfer)  Communication  Communication: No difficulties  Cognition Arousal/Alertness: Awake/alert Behavior During Therapy: Flat affect Overall Cognitive Status: No family/caregiver present to determine baseline cognitive functioning                      General Comments      Exercises        Assessment/Plan    PT Assessment Patient needs continued PT services  PT Diagnosis Hemiplegia dominant side;Altered mental status;Difficulty walking   PT Problem List Decreased  mobility;Decreased balance;Decreased cognition;Impaired tone  PT Treatment Interventions Functional mobility training;Balance training;Neuromuscular re-education;Patient/family education   PT Goals (Current goals can be found in the Care Plan section) Acute Rehab PT Goals Patient Stated Goal: none stated Time For Goal Achievement: 05/24/14 Potential to Achieve Goals: Fair    Frequency Min 3X/week   Barriers to discharge   Unknown at this time    Co-evaluation               End of Session Equipment Utilized During Treatment: Gait belt Activity Tolerance: Patient tolerated treatment well Patient left: in chair;with call bell/phone within reach;with chair alarm set Nurse Communication: Mobility status    Functional Limitation: Mobility: Walking and moving around Mobility: Walking and Moving Around Current Status [phone](G8978): 100 percent impaired, limited or restricted Mobility: Walking and Moving Around Goal Status [phone](G8979): At least 40 percent but less than 60 percent impaired, limited or restricted    Time: 1040-1111 PT Time Calculation (min) (ACUTE ONLY): 31 min   Charges:   PT Evaluation $Initial PT Evaluation Tier I: 1 Procedure PT Treatments $Therapeutic Activity: 8-22 mins   PT G Codes:   PT G-Codes **NOT FOR INPATIENT CLASS** Functional Limitation: Mobility: Walking and moving around Mobility: Walking and Moving Around Current Status (U9811(G8978): 100 percent impaired, limited or restricted Mobility: Walking and Moving Around Goal Status (B1478(G8979): At least 40 percent but less than 60 percent impaired, limited or restricted    Freida BusmanAllen, Ewing Fandino L 05/10/2014, 11:19 AM

## 2014-05-11 DIAGNOSIS — R011 Cardiac murmur, unspecified: Secondary | ICD-10-CM

## 2014-05-11 DIAGNOSIS — F0390 Unspecified dementia without behavioral disturbance: Secondary | ICD-10-CM | POA: Diagnosis not present

## 2014-05-11 DIAGNOSIS — E11311 Type 2 diabetes mellitus with unspecified diabetic retinopathy with macular edema: Secondary | ICD-10-CM | POA: Diagnosis not present

## 2014-05-11 DIAGNOSIS — R531 Weakness: Secondary | ICD-10-CM | POA: Diagnosis not present

## 2014-05-11 DIAGNOSIS — R410 Disorientation, unspecified: Secondary | ICD-10-CM | POA: Diagnosis not present

## 2014-05-11 LAB — GLUCOSE, CAPILLARY
GLUCOSE-CAPILLARY: 97 mg/dL (ref 70–99)
Glucose-Capillary: 104 mg/dL — ABNORMAL HIGH (ref 70–99)
Glucose-Capillary: 109 mg/dL — ABNORMAL HIGH (ref 70–99)
Glucose-Capillary: 108 mg/dL — ABNORMAL HIGH (ref 70–99)

## 2014-05-11 MED ORDER — INFLUENZA VAC SPLIT QUAD 0.5 ML IM SUSY
0.5000 mL | PREFILLED_SYRINGE | INTRAMUSCULAR | Status: AC
  Administered 2014-05-12: 0.5 mL via INTRAMUSCULAR
  Filled 2014-05-11: qty 0.5

## 2014-05-11 NOTE — Progress Notes (Signed)
  Echocardiogram 2D Echocardiogram has been performed.  Janalyn HarderWest, Antonin Meininger R 05/11/2014, 10:25 AM

## 2014-05-11 NOTE — Progress Notes (Signed)
[facility] PROGRESS NOTE  Minna AntisWillie Gordy Jr. BJY:[id number] DOB: 02/22/1932 DOA: 05/08/2014 PCP: Willow OraJose Paz, MD  Assessment/Plan: 1-Encephalopathy, FTT: B 12: 335 . TSH normal.  MRI negative for acute stroke.  Work up for infection negative. Negative UA, chest x ray.  ANA, RPR, HIV  pending.  Awaiting SNF, insurance approval.   2-History stroke with resultant right hemiparesis, rigidity. Continue with baclofen.  Follow up outpatient with Dr Pearlean BrownieSethi for Botox injection.  Continue with aspirin.   3-Dementia Cont aricept  Diabetes: SSI. Hb A1c 6.6. Patient with poor oral intake.  Hypokalemia; replaced.   Code Status: DNR Family Communication: care discussed with son Disposition Plan: to be dtermine, PT recommending SNF, SW consulted.    Consultants: None  Procedures:  none  Antibiotics:  none  HPI/Subjective: No complaints. Confuse.  Relates decrease appetitive.   Objective: Filed Vitals:   05/11/14 1244  BP: 127/60  Pulse: 63  Temp: 97.8 F (36.6 C)  Resp: 20    Intake/Output Summary (Last 24 hours) at 05/11/14 1253 Last data filed at 05/11/14 0852  Gross per 24 hour  Intake    600 ml  Output    850 ml  Net   -250 ml   Filed Weights   05/09/14 0554 05/10/14 0500 05/11/14 0519  Weight: 55 kg (121 lb 4.1 oz) 55.7 kg (122 lb 12.7 oz) 56.019 kg (123 lb 8 oz)    Exam:   General:  Alert in no distress.   Cardiovascular: S 1, S 2 RRR  Respiratory: CTA  Abdomen: bs present, soft, nt  Musculoskeletal: rigidity right side  Neuro; chronic left hemiparesis, rigidity. Alert follow command  Data Reviewed: Basic Metabolic Panel:  Recent Labs Lab 05/08/14 2045 05/09/14 0610 05/10/14 0525  NA 139 142 138  K 3.1* 3.5 3.5  CL 105 108 106  CO2 25 30 24   GLUCOSE 137* 105* 94  BUN 9 7 6   CREATININE 0.89 0.84 0.74  CALCIUM 8.6 8.5 8.2*   Liver Function Tests:  Recent Labs Lab 05/08/14 2045 05/09/14 0610  AST 30 24  ALT 18 15  ALKPHOS 106  88  BILITOT 0.7 0.4  PROT 7.3 6.9  ALBUMIN 3.0* 2.6*   No results for input(s): LIPASE, AMYLASE in the last 168 hours. No results for input(s): AMMONIA in the last 168 hours. CBC:  Recent Labs Lab 05/08/14 2045 05/09/14 0610  WBC 9.7 7.9  NEUTROABS 7.0  --   HGB 11.9* 11.2*  HCT 36.5* 35.1*  MCV 85.9 86.2  PLT 164 190   Cardiac Enzymes: No results for input(s): CKTOTAL, CKMB, CKMBINDEX, TROPONINI in the last 168 hours. BNP (last 3 results) No results for input(s): PROBNP in the last 8760 hours. CBG:  Recent Labs Lab 05/10/14 1150 05/10/14 1641 05/10/14 2308 05/11/14 0738 05/11/14 1117  GLUCAP 107* 108* 107* 97 104*    No results found for this or any previous visit (from the past 240 hour(s)).   Studies: Mr Brain Wo Contrast  05/09/2014   CLINICAL DATA:  Encephalopathy, stroke with resultant RIGHT hemi paresis. History of dementia, stroke, diabetes.  EXAM: MRI HEAD WITHOUT CONTRAST  TECHNIQUE: Multiplanar, multiecho pulse sequences of the brain and surrounding structures were obtained without intravenous contrast.  COMPARISON:  CT of the head May 08, 2014 and MRI of the head November 24, 2012  FINDINGS: Mildly motion degraded examination. No reduced diffusion to suggest acute ischemia. No susceptibility artifact to suggest hemorrhage; LEFT corona radiata developmental venous anomaly suspected.  Moderate to severe ventriculomegaly, predominantly on the basis of global parenchymal brain volume loss as there is overall commensurate enlargement of the cerebral sulci and cerebellar folia. LEFT mesial occipital lobe encephalomalacia with ex vacuo dilatation the LEFT occipital horn. Remote bilateral thalamus, bilateral basal ganglial lacunar infarcts. Confluent predominately periventricular white matter T2 hyperintensities. Small LEFT greater than RIGHT cerebellar remote infarcts.  No abnormal extra-axial fluid collections. Normal major intracranial vascular flow voids seen at the  skull base.  Near complete opacification LEFT maxillary sinus with air fluid level. LEFT ethmoid mucosal thickening. Ocular globes and orbital contents are unremarkable. Mastoid air cells are well aerated. No abnormal sellar expansion. No cerebellar tonsillar ectopia. Severe degenerative change of the included cervical spine though, motion degrades sensitivity.  IMPRESSION: No acute intracranial process.  Specifically, no stroke.  Relatively stable appearance of the head from November 24, 2012: Moderate to severe global brain atrophy. Remote basal ganglia and thalamus lacunar infarcts.  Remote LEFT posterior cerebral artery territory infarct. Remote small cerebellar infarcts. Moderate white matter changes can be seen with chronic small vessel ischemic disease.  Acute on chronic sinusitis.   Electronically Signed   By: Awilda Metro   On: 05/09/2014 21:10    Scheduled Meds: . amLODipine  5 mg Oral Daily  . aspirin EC  81 mg Oral Daily  . atorvastatin  40 mg Oral q1800  . baclofen  5 mg Oral BID  . cyanocobalamin  500 mcg Oral Daily  . donepezil  10 mg Oral QHS  . enoxaparin (LOVENOX) injection  40 mg Subcutaneous Q24H  . feeding supplement (GLUCERNA SHAKE)  237 mL Oral TID BM  . insulin aspart  0-5 Units Subcutaneous QHS  . insulin aspart  0-9 Units Subcutaneous TID WC  . QUEtiapine  50 mg Oral TID  . sodium chloride  3 mL Intravenous Q12H   Continuous Infusions:    Active Problems:   Dementia   Diabetes mellitus, type 2   Ataxia, late effect of cerebrovascular disease   Altered mental status   Weakness    Time spent: 35 minutes.     Hartley Barefoot A  [facility] Pager [phone] If 7PM-7AM, please contact night-coverage at [email or web address], password [facility] 05/11/2014, 12:53 PM  LOS: 3 days

## 2014-05-11 NOTE — Plan of Care (Signed)
Problem: Phase III Progression Outcomes Goal: Foley discontinued Outcome: Not Applicable Date Met:  64/35/39 Pt has no foley, voids

## 2014-05-11 NOTE — Progress Notes (Signed)
UR completed 

## 2014-05-11 NOTE — Social Work (Signed)
Clinical Social Work Department BRIEF PSYCHOSOCIAL ASSESSMENT 05/11/2014  Patient:  MCKOY,Neven     Account Number:  [id number]     Admit date:  01/22/2014  Clinical Social Worker:  Lenord Fellers  Date/Time:  05/11/2014 02:01 PM  Referred by:  Physician  Date Referred:  05/10/2014 Referred for  SNF Placement   Other Referral:   Interview type:  Family Other interview type:    PSYCHOSOCIAL DATA Living Status:  FAMILY Admitted from facility:   Level of care:   Primary support name:  Heidi Lemay Primary support relationship to patient:  CHILD, ADULT Degree of support available:   Patient lives with wife.  Son, Sharla Kidney and youngest daughter are identified of POA according to son. This CSW conferred with son for this assessment.    CURRENT CONCERNS Current Concerns  Post-Acute Placement   Other Concerns:    SOCIAL WORK ASSESSMENT / PLAN CSW met with patient and attempted interview but patient was not able . CSW later attempted to call patient's wife, but sdid not get an answer. CSW called son who answered and identified that he would remain available at [phone]. Family is hoping for SNF placement according to son and patient has had placement in the past.   Assessment/plan status:  Information/Referral to [employer] Other assessment/ plan:   Information/referral to community resources:    PATIENT'S/FAMILY'S RESPONSE TO PLAN OF CARE: Patient's sonm stated that the fmaily's first choice has always ben [facility] but son will discuss further with family about other back ups. CSW will attempt placement as soon as possible.    Christene Lye MSW, [place]

## 2014-05-12 DIAGNOSIS — I69393 Ataxia following cerebral infarction: Secondary | ICD-10-CM | POA: Diagnosis not present

## 2014-05-12 DIAGNOSIS — M1990 Unspecified osteoarthritis, unspecified site: Secondary | ICD-10-CM | POA: Diagnosis not present

## 2014-05-12 DIAGNOSIS — I4891 Unspecified atrial fibrillation: Secondary | ICD-10-CM | POA: Diagnosis not present

## 2014-05-12 DIAGNOSIS — R531 Weakness: Secondary | ICD-10-CM | POA: Diagnosis not present

## 2014-05-12 DIAGNOSIS — E11319 Type 2 diabetes mellitus with unspecified diabetic retinopathy without macular edema: Secondary | ICD-10-CM

## 2014-05-12 DIAGNOSIS — M6281 Muscle weakness (generalized): Secondary | ICD-10-CM | POA: Diagnosis not present

## 2014-05-12 DIAGNOSIS — Z8673 Personal history of transient ischemic attack (TIA), and cerebral infarction without residual deficits: Secondary | ICD-10-CM | POA: Diagnosis not present

## 2014-05-12 DIAGNOSIS — E139 Other specified diabetes mellitus without complications: Secondary | ICD-10-CM | POA: Diagnosis not present

## 2014-05-12 DIAGNOSIS — G9340 Encephalopathy, unspecified: Secondary | ICD-10-CM | POA: Diagnosis not present

## 2014-05-12 DIAGNOSIS — G9349 Other encephalopathy: Secondary | ICD-10-CM | POA: Diagnosis not present

## 2014-05-12 DIAGNOSIS — Z86718 Personal history of other venous thrombosis and embolism: Secondary | ICD-10-CM | POA: Diagnosis not present

## 2014-05-12 DIAGNOSIS — Z87440 Personal history of urinary (tract) infections: Secondary | ICD-10-CM | POA: Diagnosis not present

## 2014-05-12 DIAGNOSIS — R410 Disorientation, unspecified: Secondary | ICD-10-CM

## 2014-05-12 DIAGNOSIS — E46 Unspecified protein-calorie malnutrition: Secondary | ICD-10-CM | POA: Diagnosis not present

## 2014-05-12 DIAGNOSIS — I69351 Hemiplegia and hemiparesis following cerebral infarction affecting right dominant side: Secondary | ICD-10-CM | POA: Diagnosis not present

## 2014-05-12 DIAGNOSIS — E119 Type 2 diabetes mellitus without complications: Secondary | ICD-10-CM | POA: Diagnosis not present

## 2014-05-12 DIAGNOSIS — Z23 Encounter for immunization: Secondary | ICD-10-CM | POA: Diagnosis not present

## 2014-05-12 DIAGNOSIS — Z7982 Long term (current) use of aspirin: Secondary | ICD-10-CM | POA: Diagnosis not present

## 2014-05-12 DIAGNOSIS — Z87891 Personal history of nicotine dependence: Secondary | ICD-10-CM | POA: Diagnosis not present

## 2014-05-12 DIAGNOSIS — E780 Pure hypercholesterolemia: Secondary | ICD-10-CM | POA: Diagnosis not present

## 2014-05-12 DIAGNOSIS — R464 Slowness and poor responsiveness: Secondary | ICD-10-CM | POA: Diagnosis not present

## 2014-05-12 DIAGNOSIS — R627 Adult failure to thrive: Secondary | ICD-10-CM | POA: Diagnosis not present

## 2014-05-12 DIAGNOSIS — F0390 Unspecified dementia without behavioral disturbance: Secondary | ICD-10-CM | POA: Diagnosis not present

## 2014-05-12 DIAGNOSIS — E559 Vitamin D deficiency, unspecified: Secondary | ICD-10-CM | POA: Diagnosis not present

## 2014-05-12 DIAGNOSIS — I2510 Atherosclerotic heart disease of native coronary artery without angina pectoris: Secondary | ICD-10-CM | POA: Diagnosis not present

## 2014-05-12 DIAGNOSIS — F0391 Unspecified dementia with behavioral disturbance: Secondary | ICD-10-CM | POA: Diagnosis not present

## 2014-05-12 DIAGNOSIS — E11311 Type 2 diabetes mellitus with unspecified diabetic retinopathy with macular edema: Secondary | ICD-10-CM | POA: Diagnosis not present

## 2014-05-12 DIAGNOSIS — E876 Hypokalemia: Secondary | ICD-10-CM | POA: Diagnosis not present

## 2014-05-12 DIAGNOSIS — R1311 Dysphagia, oral phase: Secondary | ICD-10-CM | POA: Diagnosis not present

## 2014-05-12 DIAGNOSIS — I6340 Cerebral infarction due to embolism of unspecified cerebral artery: Secondary | ICD-10-CM | POA: Diagnosis not present

## 2014-05-12 LAB — GLUCOSE, CAPILLARY
Glucose-Capillary: 107 mg/dL — ABNORMAL HIGH (ref 70–99)
Glucose-Capillary: 139 mg/dL — ABNORMAL HIGH (ref 70–99)
Glucose-Capillary: 120 mg/dL — ABNORMAL HIGH (ref 70–99)

## 2014-05-12 LAB — HIV ANTIBODY (ROUTINE TESTING W REFLEX): HIV Screen 4th Generation wRfx: NONREACTIVE

## 2014-05-12 LAB — RPR: RPR Ser Ql: NONREACTIVE

## 2014-05-12 LAB — ANA: ANA: NEGATIVE

## 2014-05-12 MED ORDER — PNEUMOCOCCAL VAC POLYVALENT 25 MCG/0.5ML IJ INJ: 0.5000 mL | INJECTION | INTRAMUSCULAR | Status: DC

## 2014-05-12 MED ORDER — AMLODIPINE BESYLATE 5 MG PO TABS: 5.0000 mg | ORAL_TABLET | Freq: Every day | ORAL | Status: DC

## 2014-05-12 MED ORDER — BACLOFEN 10 MG PO TABS: 5.0000 mg | ORAL_TABLET | Freq: Two times a day (BID) | ORAL | Status: DC

## 2014-05-12 MED ORDER — PNEUMOCOCCAL VAC POLYVALENT 25 MCG/0.5ML IJ INJ
0.5000 mL | INJECTION | Freq: Once | INTRAMUSCULAR | Status: AC
  Administered 2014-05-12: 0.5 mL via INTRAMUSCULAR
  Filled 2014-05-12: qty 0.5

## 2014-05-12 MED ORDER — GLUCERNA SHAKE PO LIQD: 237.0000 mL | Freq: Three times a day (TID) | ORAL | Status: AC

## 2014-05-12 NOTE — Discharge Summary (Signed)
Physician Discharge Summary  Raymond Rangel. ZOX:[id number] DOB: 1931-11-17 DOA: 05/08/2014  PCP: Raymond Ora, MD  Admit date: 05/08/2014 Discharge date: 05/12/2014  Time spent: 35 minutes  Recommendations for Outpatient Follow-up:   Discharge Diagnoses:    Encephalopathy, FTT   Dementia   Diabetes mellitus, type 2   Ataxia, late effect of cerebrovascular disease   Altered mental status   Weakness   Discharge Condition: Stable.   Diet recommendation: Heart Healthy diet, Carb modified.   Filed Weights   05/10/14 0500 05/11/14 0519 05/12/14 0518  Weight: 55.7 kg (122 lb 12.7 oz) 56.019 kg (123 lb 8 oz) 54.658 kg (120 lb 8 oz)    History of present illness:  81 yo male with hx of CVA and dementia apparently seemed to have more confusion and also generalized weakness and FTT for the past couple of days. Pt was brought to ED by his family for evaluation of being less responsive and CT brain was negative for any acute process. Pt was noted to be hypokalemic with potassium of 3.4 Pt will be admitted for FTT, and ataxia likely related to worsening dementia, and hypokalemia.    Hospital Course:  1-Encephalopathy, FTT: B 12: 335 . TSH normal; 2.9 MRI negative for acute stroke.  Work up for infection negative. Negative UA, chest x ray.  ANA, RPR, HIV pending.  Transfer to SNF today.   2-History stroke with resultant right hemiparesis, rigidity. Continue with baclofen.  Follow up outpatient with Dr Pearlean Brownie for Botox injection.  Continue with aspirin.   3-Dementia Cont aricept  Diabetes: SSI. Hb A1c 6.6. Patient with poor oral intake.  Hypokalemia; replaced.   Procedures:  none  Consultations:  none  Discharge Exam: Filed Vitals:   05/12/14 0518  BP: 137/67  Pulse: 58  Temp: 98 F (36.7 C)  Resp: 16    General: Alert no distress.  Cardiovascular: S 1, S 2 RRR Respiratory: CTA Neurology; alert, right hemiparesis.   Discharge Instructions   Discharge  Instructions    Diet - low sodium heart healthy    Complete by:  As directed      Increase activity slowly    Complete by:  As directed           Current Discharge Medication List    START taking these medications   Details  amLODipine (NORVASC) 5 MG tablet Take 1 tablet (5 mg total) by mouth daily. Qty: 30 tablet, Refills: 0    feeding supplement, GLUCERNA SHAKE, (GLUCERNA SHAKE) LIQD Take 237 mLs by mouth 3 (three) times daily between meals. Qty: 30 Can, Refills: 0      CONTINUE these medications which have CHANGED   Details  baclofen (LIORESAL) 10 MG tablet Take 0.5 tablets (5 mg total) by mouth 2 (two) times daily. Qty: 30 each, Refills: 0      CONTINUE these medications which have NOT CHANGED   Details  aspirin EC 81 MG tablet Take 81 mg by mouth daily.    atorvastatin (LIPITOR) 40 MG tablet TAKE ONE TABLET BY MOUTH ONCE DAILY Qty: 90 tablet, Refills: 1    Cholecalciferol (VITAMIN D PO) Take 1 tablet by mouth daily with lunch.    donepezil (ARICEPT) 5 MG tablet Take 2 tablets (10 mg total) by mouth at bedtime. Qty: 60 tablet, Refills: 11    QUEtiapine (SEROQUEL) 50 MG tablet Take 1 tablet (50 mg total) by mouth 3 (three) times daily. DUE FOR APPT WITH DR [name] [phone]. Qty: 90 tablet,  Refills: 2      STOP taking these medications     cyanocobalamin 500 MCG tablet        No Known Allergies Follow-up Information    Follow up with Raymond OraJose Paz, MD.   Specialty:  Internal Medicine   Contact information:   [address] [place] [address] [phone]       Follow up with Raymond Rangel,PRAMOD, MD.   Specialties:  Neurology, Radiology   Contact information:   [address] [place] [address] [phone]        The results of significant diagnostics from this hospitalization (including imaging, microbiology, ancillary and laboratory) are listed below for reference.    Significant Diagnostic Studies: Dg Chest 2 View  05/08/2014    CLINICAL DATA:  Weakness and loss of appetite for several days.  EXAM: CHEST  2 VIEW  COMPARISON:  Single view of the chest 07/06/2013 and 02/13/2013.  FINDINGS: Mild left basilar atelectasis is noted. The lungs are otherwise clear. No pneumothorax or pleural effusion. Heart size is normal. Postoperative change both shoulders is noted.  IMPRESSION: No acute disease.   Electronically Signed   By: Drusilla Kannerhomas  Dalessio M.D.   On: 05/08/2014 21:19   Dg Pelvis 1-2 Views  05/08/2014   CLINICAL DATA:  Pelvic pain and weakness.  EXAM: PELVIS - 1-2 VIEW  COMPARISON:  Abdomen 04/06/2009  FINDINGS: There is no evidence of pelvic fracture or diastasis. No pelvic bone lesions are seen. Evaluation of the sacrum is limited due to overlying bowel gas. Degenerative changes in the lower lumbar spine and hips.  IMPRESSION: Degenerative changes in the lower lumbar spine and hips. No acute bony abnormalities.   Electronically Signed   By: Burman NievesWilliam  Stevens M.D.   On: 05/08/2014 23:53   Ct Head Wo Contrast  05/08/2014   CLINICAL DATA:  Weakness and loss of appetite for several days. Right-sided weakness.  EXAM: CT HEAD WITHOUT CONTRAST  TECHNIQUE: Contiguous axial images were obtained from the base of the skull through the vertex without intravenous contrast.  COMPARISON:  02/14/2013  FINDINGS: Diffuse cerebral atrophy. Prominent ventricular dilatation consistent with central atrophy. Low-attenuation change throughout the deep white matter consistent small vessel ischemia. Old lacunar infarcts in the deep white matter. Focal old area of encephalomalacia in the left occipital region. No significant change since prior study. No mass effect or midline shift. No abnormal extra-axial fluid collections. Gray-white matter junctions are distinct. Basal cisterns are not effaced. No evidence of acute intracranial hemorrhage. No depressed skull fractures. Mucosal thickening and partial opacification in the left maxillary antrum. Small retention  cyst in the right maxillary antrum. Vascular calcifications. Mastoid air cells are not opacified.  IMPRESSION: No acute intracranial abnormalities. Chronic atrophy and small vessel ischemic changes with multiple old lacunar infarcts and old infarct in the left occipital region. Inflammatory changes in the paranasal sinuses.   Electronically Signed   By: Burman NievesWilliam  Stevens M.D.   On: 05/08/2014 22:06   Mr Brain Wo Contrast  05/09/2014   CLINICAL DATA:  Encephalopathy, stroke with resultant RIGHT hemi paresis. History of dementia, stroke, diabetes.  EXAM: MRI HEAD WITHOUT CONTRAST  TECHNIQUE: Multiplanar, multiecho pulse sequences of the brain and surrounding structures were obtained without intravenous contrast.  COMPARISON:  CT of the head May 08, 2014 and MRI of the head November 24, 2012  FINDINGS: Mildly motion degraded examination. No reduced diffusion to suggest acute ischemia. No susceptibility artifact to suggest hemorrhage; LEFT  corona radiata developmental venous anomaly suspected.  Moderate to severe ventriculomegaly, predominantly on the basis of global parenchymal brain volume loss as there is overall commensurate enlargement of the cerebral sulci and cerebellar folia. LEFT mesial occipital lobe encephalomalacia with ex vacuo dilatation the LEFT occipital horn. Remote bilateral thalamus, bilateral basal ganglial lacunar infarcts. Confluent predominately periventricular white matter T2 hyperintensities. Small LEFT greater than RIGHT cerebellar remote infarcts.  No abnormal extra-axial fluid collections. Normal major intracranial vascular flow voids seen at the skull base.  Near complete opacification LEFT maxillary sinus with air fluid level. LEFT ethmoid mucosal thickening. Ocular globes and orbital contents are unremarkable. Mastoid air cells are well aerated. No abnormal sellar expansion. No cerebellar tonsillar ectopia. Severe degenerative change of the included cervical spine though, motion  degrades sensitivity.  IMPRESSION: No acute intracranial process.  Specifically, no stroke.  Relatively stable appearance of the head from November 24, 2012: Moderate to severe global brain atrophy. Remote basal ganglia and thalamus lacunar infarcts.  Remote LEFT posterior cerebral artery territory infarct. Remote small cerebellar infarcts. Moderate white matter changes can be seen with chronic small vessel ischemic disease.  Acute on chronic sinusitis.   Electronically Signed   By: Awilda Metro   On: 05/09/2014 21:10    Microbiology: No results found for this or any previous visit (from the past 240 hour(s)).   Labs: Basic Metabolic Panel:  Recent Labs Lab 05/08/14 2045 05/09/14 0610 05/10/14 0525  NA 139 142 138  K 3.1* 3.5 3.5  CL 105 108 106  CO2 GLUCOSE 137* 105* 94  BUN CREATININE 0.89 0.84 0.74  CALCIUM 8.6 8.5 8.2*   Liver Function Tests:  Recent Labs Lab 05/08/14 2045 05/09/14 0610  AST 30 24  ALT 18 15  ALKPHOS 106 88  BILITOT 0.7 0.4  PROT 7.3 6.9  ALBUMIN 3.0* 2.6*   No results for input(s): LIPASE, AMYLASE in the last 168 hours. No results for input(s): AMMONIA in the last 168 hours. CBC:  Recent Labs Lab 05/08/14 2045 05/09/14 0610  WBC 9.7 7.9  NEUTROABS 7.0  --   HGB 11.9* 11.2*  HCT 36.5* 35.1*  MCV 85.9 86.2  PLT 164 190   Cardiac Enzymes: No results for input(s): CKTOTAL, CKMB, CKMBINDEX, TROPONINI in the last 168 hours. BNP: BNP (last 3 results) No results for input(s): PROBNP in the last 8760 hours. CBG:  Recent Labs Lab 05/11/14 0738 05/11/14 1117 05/11/14 1601 05/11/14 2103 05/12/14 0808  GLUCAP 97 104* 109* 108* 107*       Signed:  Satoru Milich A  [facility] 05/12/2014, 10:06 AM

## 2014-05-12 NOTE — Progress Notes (Signed)
PIV removed.  Transport here for pt.

## 2014-05-12 NOTE — Progress Notes (Signed)
Report given to Antoinette at [facility]. 

## 2014-05-12 NOTE — Clinical Social Work Placement (Signed)
Clinical Social Work Department CLINICAL SOCIAL WORK PLACEMENT NOTE 05/12/2014  Patient:  Raymond Rangel,Raymond Rangel  Account Number:  [id number] Admit date:  05/08/2014  Clinical Social Worker:  Lavell LusterJOSEPH BRYANT Lashawne Dura, LCSWA  Date/time:  05/12/2014 02:11 PM  Clinical Social Work is seeking post-discharge placement for this patient at the following level of care:   SKILLED NURSING   (*CSW will update this form in Epic as items are completed)   05/12/2014  Patient/family provided with [facility] Department of Clinical Social Work's list of facilities offering this level of care within the geographic area requested by the patient (or if unable, by the patient's family).  05/12/2014  Patient/family informed of their freedom to choose among providers that offer the needed level of care, that participate in Medicare, Medicaid or managed care program needed by the patient, have an available bed and are willing to accept the patient.  05/12/2014  Patient/family informed of [facility]' ownership interest in [facility], as well as of the fact that they are under no obligation to receive care at this facility.  PASARR submitted to EDS on 05/12/2014 PASARR number received on 05/12/2014  FL2 transmitted to all facilities in geographic area requested by pt/family on  05/12/2014 FL2 transmitted to all facilities within larger geographic area on   Patient informed that his/her managed care company has contracts with or will negotiate with  certain facilities, including the following:     Patient/family informed of bed offers received:  05/12/2014 [facility] Physician recommends and patient chooses bed at    [facility] on  05/12/2014 Patient to be transferred to facility by Ambulance Patient and family notified of transfer on 05/12/2014 Name of family member notified:  Maurine Ministerennis  The following  physician request were entered in Epic:   Additional Comments:    Per MD patient ready for DC to [facility]. RN, patient, patient's family, and facility notified of DC. RN given number for report. DC packet on chart. AMbulance transport requested for patient for 2:30PM. CSW signing off.    Roddie McBryant Rafal Archuleta MSW, [place], [place], [phone]

## 2014-05-13 ENCOUNTER — Telehealth: Payer: Self-pay

## 2014-05-13 DIAGNOSIS — Z8673 Personal history of transient ischemic attack (TIA), and cerebral infarction without residual deficits: Secondary | ICD-10-CM | POA: Diagnosis not present

## 2014-05-13 DIAGNOSIS — E876 Hypokalemia: Secondary | ICD-10-CM | POA: Diagnosis not present

## 2014-05-13 DIAGNOSIS — M6281 Muscle weakness (generalized): Secondary | ICD-10-CM | POA: Diagnosis not present

## 2014-05-13 DIAGNOSIS — F0390 Unspecified dementia without behavioral disturbance: Secondary | ICD-10-CM | POA: Diagnosis not present

## 2014-05-13 NOTE — Telephone Encounter (Signed)
Admit date: 05/08/2014 Discharge date: 05/12/2014  Reason for admission:  AMS, Dementia  Pt was transferred to SNF [facility]).  Nurse caring for Raymond Rangel stated that in house physician will be follow up with patient.  She also said that they will schedule appointment with Dr. Pearlean BrownieSethi as recommended in patient's discharge paperwork.     Family concern: Spoke with family and they were told that patient was a diabetic.  As a result, pt is on a diabetic diet at SNF.  According to daughter, pt is not eating this diet well and wants to know if this restriction could be lifted.  She also wants to know whether or not patient is a diabetic given that the family was told twice by Dr. Drue NovelPaz that he was not.   Please advise.

## 2014-05-13 NOTE — Telephone Encounter (Signed)
A1c in 2014 was 6.3, he did have pre- diabetes but needed no specific treatment. His A1c is now 6.6 (mild diabetes) Recommend to be seen periodically by the doctors at the SNF if he is unable to come to the office

## 2014-05-14 NOTE — Telephone Encounter (Signed)
Spoke with family regarding Dr. Leta JunglingPaz's note.  They stated understanding and plan to follow patient's care in SNF.  According to patient's daughter, patient is suppose to be in SNF x 20-21 days.

## 2014-05-16 DIAGNOSIS — I69351 Hemiplegia and hemiparesis following cerebral infarction affecting right dominant side: Secondary | ICD-10-CM | POA: Diagnosis not present

## 2014-05-16 DIAGNOSIS — I4891 Unspecified atrial fibrillation: Secondary | ICD-10-CM | POA: Diagnosis not present

## 2014-05-16 DIAGNOSIS — F0391 Unspecified dementia with behavioral disturbance: Secondary | ICD-10-CM | POA: Diagnosis not present

## 2014-05-16 DIAGNOSIS — E139 Other specified diabetes mellitus without complications: Secondary | ICD-10-CM | POA: Diagnosis not present

## 2014-05-16 DIAGNOSIS — E46 Unspecified protein-calorie malnutrition: Secondary | ICD-10-CM | POA: Diagnosis not present

## 2014-05-20 ENCOUNTER — Ambulatory Visit (INDEPENDENT_AMBULATORY_CARE_PROVIDER_SITE_OTHER): Payer: Medicare Other | Admitting: Diagnostic Neuroimaging

## 2014-05-20 ENCOUNTER — Encounter: Payer: Self-pay | Admitting: Diagnostic Neuroimaging

## 2014-05-20 VITALS — BP 147/86 | HR 87

## 2014-05-20 DIAGNOSIS — I639 Cerebral infarction, unspecified: Secondary | ICD-10-CM

## 2014-05-20 DIAGNOSIS — I6340 Cerebral infarction due to embolism of unspecified cerebral artery: Secondary | ICD-10-CM | POA: Diagnosis not present

## 2014-05-20 DIAGNOSIS — F03C0 Unspecified dementia, severe, without behavioral disturbance, psychotic disturbance, mood disturbance, and anxiety: Secondary | ICD-10-CM

## 2014-05-20 DIAGNOSIS — F0390 Unspecified dementia without behavioral disturbance: Secondary | ICD-10-CM | POA: Diagnosis not present

## 2014-05-20 NOTE — Patient Instructions (Signed)
Follow up with Dr. Donette LarryHusain and Dr. Drue NovelPaz or palliative care team.
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
## 2014-05-21 ENCOUNTER — Telehealth: Payer: Self-pay | Admitting: *Deleted

## 2014-05-21 NOTE — Telephone Encounter (Signed)
Letter,patient needs a dictated  letter, sent to Dr Marjory LiesPenumalli and Lelon MastSamantha 05-21-13.

## 2014-05-27 ENCOUNTER — Encounter: Payer: Self-pay | Admitting: *Deleted

## 2014-05-28 DIAGNOSIS — F0390 Unspecified dementia without behavioral disturbance: Secondary | ICD-10-CM | POA: Diagnosis not present

## 2014-05-28 DIAGNOSIS — Z9181 History of falling: Secondary | ICD-10-CM | POA: Diagnosis not present

## 2014-05-28 DIAGNOSIS — I10 Essential (primary) hypertension: Secondary | ICD-10-CM | POA: Diagnosis not present

## 2014-05-28 DIAGNOSIS — Z823 Family history of stroke: Secondary | ICD-10-CM | POA: Diagnosis not present

## 2014-05-28 DIAGNOSIS — G4089 Other seizures: Secondary | ICD-10-CM | POA: Diagnosis not present

## 2014-05-28 DIAGNOSIS — N390 Urinary tract infection, site not specified: Secondary | ICD-10-CM | POA: Diagnosis not present

## 2014-05-29 ENCOUNTER — Telehealth: Payer: Self-pay | Admitting: Internal Medicine

## 2014-05-29 NOTE — Telephone Encounter (Signed)
I would say a 30 minute appt, just to be sure.

## 2014-05-29 NOTE — Telephone Encounter (Signed)
Caller name: Randa Ngotoyka jones Relation to pt: daughter Call back number: [phone] Pharmacy:  Reason for call:   Patient being discharged from nursing facility. Does this require 15 or 30 min? Only available on M,W,and F.

## 2014-05-30 NOTE — Telephone Encounter (Signed)
Appointment scheduled for 06/02/14

## 2014-06-02 ENCOUNTER — Ambulatory Visit (INDEPENDENT_AMBULATORY_CARE_PROVIDER_SITE_OTHER): Payer: Medicare Other | Admitting: Internal Medicine

## 2014-06-02 ENCOUNTER — Encounter: Payer: Self-pay | Admitting: Internal Medicine

## 2014-06-02 VITALS — BP 130/68 | HR 78 | Temp 98.0°F | Resp 14

## 2014-06-02 DIAGNOSIS — F0390 Unspecified dementia without behavioral disturbance: Secondary | ICD-10-CM | POA: Diagnosis not present

## 2014-06-02 DIAGNOSIS — D649 Anemia, unspecified: Secondary | ICD-10-CM | POA: Diagnosis not present

## 2014-06-02 DIAGNOSIS — E039 Hypothyroidism, unspecified: Secondary | ICD-10-CM

## 2014-06-02 DIAGNOSIS — N390 Urinary tract infection, site not specified: Secondary | ICD-10-CM

## 2014-06-02 DIAGNOSIS — E538 Deficiency of other specified B group vitamins: Secondary | ICD-10-CM | POA: Diagnosis not present

## 2014-06-02 LAB — CBC WITH DIFFERENTIAL/PLATELET
BASOS ABS: 0.1 10*3/uL (ref 0.0–0.1)
Basophils Relative: 1.7 % (ref 0.0–3.0)
Eosinophils Absolute: 0.3 10*3/uL (ref 0.0–0.7)
Eosinophils Relative: 3.6 % (ref 0.0–5.0)
HEMATOCRIT: 44.1 % (ref 39.0–52.0)
HEMOGLOBIN: 14.6 g/dL (ref 13.0–17.0)
Lymphocytes Relative: 21.1 % (ref 12.0–46.0)
Lymphs Abs: 1.8 10*3/uL (ref 0.7–4.0)
MCHC: 33 g/dL (ref 30.0–36.0)
MCV: 84.5 fl (ref 78.0–100.0)
MONOS PCT: 6.9 % (ref 3.0–12.0)
Monocytes Absolute: 0.6 10*3/uL (ref 0.1–1.0)
NEUTROS ABS: 5.6 10*3/uL (ref 1.4–7.7)
Neutrophils Relative %: 66.7 % (ref 43.0–77.0)
Platelets: 228 10*3/uL (ref 150.0–400.0)
RBC: 5.22 Mil/uL (ref 4.22–5.81)
RDW: 16.4 % — AB (ref 11.5–15.5)
WBC: 8.5 10*3/uL (ref 4.0–10.5)

## 2014-06-02 MED ORDER — BACLOFEN 10 MG PO TABS: 5.0000 mg | ORAL_TABLET | Freq: Two times a day (BID) | ORAL | Status: DC

## 2014-06-02 NOTE — Assessment & Plan Note (Signed)
Currently on daily antibiotic. Afebrile

## 2014-06-02 NOTE — Assessment & Plan Note (Signed)
Started thyroid supplements few days ago at the SNF, apparently d/t slightly elevated TSH. For now,we'll continue with thyroid supplements,recheck levels on return to theoffice.

## 2014-06-02 NOTE — Assessment & Plan Note (Addendum)
Advise dementia, now on failure to thrive. Recently admitted with a UTI, then went to a SNF, now is back home. At the SNF, Seroquel [place] Dep[place]. With Depakote he is extremely sleepy however with Seroquel was about the same. I had a long conversation with the family, we are walking a fine line, we like him to be calm but not too sleepy. Plan:  Depakote level Decrease dose to Depakote once a day, twice a day optional Continue Aricept Consult palliative care for symptom management (agitation)
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
## 2014-06-02 NOTE — Assessment & Plan Note (Signed)
Last B12 satisfactory, continue with oral B12

## 2014-06-02 NOTE — Progress Notes (Signed)
Pre visit review using our clinic review tool, if applicable. No additional management support is needed unless otherwise documented below in the visit note. 

## 2014-06-02 NOTE — Assessment & Plan Note (Addendum)
History of mild anemia, on iron. Wife reports today that had dark stools this weekend, stools are back to normal today. Certainly he could be having a GI bleed, we talk about his options including further eval versus conservative treatment. We both agree that he does not need aggressive treatment, endoscopies, etc. For now we'll check a CBC to be sure his hemoglobin is not dropping, then the family will call if stools continue to be dark.

## 2014-06-02 NOTE — Patient Instructions (Signed)
Get your blood work before you leave    Take out B12 supplement daily  Decrease Depakote to 1 tablet daily, okay to take it twice a day if needed.  Call if the stools are dark again or bloody.  Continue iron supplements   Come back in 2 months  for a  office visit

## 2014-06-03 DIAGNOSIS — N390 Urinary tract infection, site not specified: Secondary | ICD-10-CM | POA: Diagnosis not present

## 2014-06-03 DIAGNOSIS — I10 Essential (primary) hypertension: Secondary | ICD-10-CM | POA: Diagnosis not present

## 2014-06-03 DIAGNOSIS — Z823 Family history of stroke: Secondary | ICD-10-CM | POA: Diagnosis not present

## 2014-06-03 DIAGNOSIS — Z9181 History of falling: Secondary | ICD-10-CM | POA: Diagnosis not present

## 2014-06-03 DIAGNOSIS — G4089 Other seizures: Secondary | ICD-10-CM | POA: Diagnosis not present

## 2014-06-03 DIAGNOSIS — F0390 Unspecified dementia without behavioral disturbance: Secondary | ICD-10-CM | POA: Diagnosis not present

## 2014-06-03 LAB — VALPROIC ACID LEVEL: VALPROIC ACID LVL: 20.9 ug/mL — AB (ref 50.0–100.0)

## 2014-06-04 DIAGNOSIS — N390 Urinary tract infection, site not specified: Secondary | ICD-10-CM | POA: Diagnosis not present

## 2014-06-04 DIAGNOSIS — I10 Essential (primary) hypertension: Secondary | ICD-10-CM | POA: Diagnosis not present

## 2014-06-04 DIAGNOSIS — G4089 Other seizures: Secondary | ICD-10-CM | POA: Diagnosis not present

## 2014-06-04 DIAGNOSIS — Z823 Family history of stroke: Secondary | ICD-10-CM | POA: Diagnosis not present

## 2014-06-04 DIAGNOSIS — F0390 Unspecified dementia without behavioral disturbance: Secondary | ICD-10-CM | POA: Diagnosis not present

## 2014-06-04 DIAGNOSIS — Z9181 History of falling: Secondary | ICD-10-CM | POA: Diagnosis not present

## 2014-06-06 DIAGNOSIS — N390 Urinary tract infection, site not specified: Secondary | ICD-10-CM | POA: Diagnosis not present

## 2014-06-06 DIAGNOSIS — I10 Essential (primary) hypertension: Secondary | ICD-10-CM | POA: Diagnosis not present

## 2014-06-06 DIAGNOSIS — G4089 Other seizures: Secondary | ICD-10-CM | POA: Diagnosis not present

## 2014-06-06 DIAGNOSIS — F0390 Unspecified dementia without behavioral disturbance: Secondary | ICD-10-CM | POA: Diagnosis not present

## 2014-06-06 DIAGNOSIS — Z9181 History of falling: Secondary | ICD-10-CM | POA: Diagnosis not present

## 2014-06-06 DIAGNOSIS — Z823 Family history of stroke: Secondary | ICD-10-CM | POA: Diagnosis not present

## 2014-06-10 DIAGNOSIS — I10 Essential (primary) hypertension: Secondary | ICD-10-CM | POA: Diagnosis not present

## 2014-06-10 DIAGNOSIS — G4089 Other seizures: Secondary | ICD-10-CM | POA: Diagnosis not present

## 2014-06-10 DIAGNOSIS — Z9181 History of falling: Secondary | ICD-10-CM | POA: Diagnosis not present

## 2014-06-10 DIAGNOSIS — N390 Urinary tract infection, site not specified: Secondary | ICD-10-CM | POA: Diagnosis not present

## 2014-06-10 DIAGNOSIS — Z823 Family history of stroke: Secondary | ICD-10-CM | POA: Diagnosis not present

## 2014-06-10 DIAGNOSIS — F0390 Unspecified dementia without behavioral disturbance: Secondary | ICD-10-CM | POA: Diagnosis not present

## 2014-06-11 ENCOUNTER — Telehealth: Payer: Self-pay | Admitting: Internal Medicine

## 2014-06-11 DIAGNOSIS — F0390 Unspecified dementia without behavioral disturbance: Secondary | ICD-10-CM | POA: Diagnosis not present

## 2014-06-11 DIAGNOSIS — N390 Urinary tract infection, site not specified: Secondary | ICD-10-CM | POA: Diagnosis not present

## 2014-06-11 DIAGNOSIS — G4089 Other seizures: Secondary | ICD-10-CM | POA: Diagnosis not present

## 2014-06-11 DIAGNOSIS — I10 Essential (primary) hypertension: Secondary | ICD-10-CM | POA: Diagnosis not present

## 2014-06-11 DIAGNOSIS — Z9181 History of falling: Secondary | ICD-10-CM | POA: Diagnosis not present

## 2014-06-11 DIAGNOSIS — Z823 Family history of stroke: Secondary | ICD-10-CM | POA: Diagnosis not present

## 2014-06-11 MED ORDER — LEVOTHYROXINE SODIUM 25 MCG PO TABS: 25.0000 ug | ORAL_TABLET | Freq: Every day | ORAL | Status: AC

## 2014-06-11 MED ORDER — ATORVASTATIN CALCIUM 40 MG PO TABS: 40.0000 mg | ORAL_TABLET | Freq: Every day | ORAL | Status: AC

## 2014-06-11 MED ORDER — AMLODIPINE BESYLATE 5 MG PO TABS: 5.0000 mg | ORAL_TABLET | Freq: Every day | ORAL | Status: DC

## 2014-06-11 MED ORDER — CEPHALEXIN 250 MG PO CAPS
250.0000 mg | ORAL_CAPSULE | Freq: Every day | ORAL | Status: DC
Start: 2014-06-11 — End: 2014-11-03

## 2014-06-11 NOTE — Telephone Encounter (Signed)
Okay verbals?

## 2014-06-11 NOTE — Telephone Encounter (Signed)
[facility] called back stating that patient is not completely out of keflex.

## 2014-06-11 NOTE — Telephone Encounter (Signed)
Atorvastatin, Amlodipine, Levothyroxine and Keflex refilled to [facility]. Vitamin D listed as an over-the-counter medication not an Rx.

## 2014-06-11 NOTE — Telephone Encounter (Signed)
Yes

## 2014-06-11 NOTE — Telephone Encounter (Signed)
Caller name: Kassie Mendsokay Jones (power of attorney) Relation to pt: daughter  Call back number: [phone] [facility] [address] - [place], [place] - [address] [phone] (Phone) [phone] (Fax)        Reason for call: requesting a refill  atorvastatin (LIPITOR) 40 MG tablet (completely out) amLODipine (NORVASC) 5 MG tablet Cholecalciferol (VITAMIN D PO) 1000MG  levothyroxine (SYNTHROID, LEVOTHROID) 25 MCG tablet  cephALEXin (KEFLEX) 250 MG capsule  (completely out)

## 2014-06-11 NOTE — Telephone Encounter (Signed)
LMOM informing Wes okay for Pt to have PT one time a week for 3 weeks. Informed him to call if he has any further questions.

## 2014-06-11 NOTE — Telephone Encounter (Signed)
Caller name: [place]es from [facility] Relation to pt: Call back number: [phone] Pharmacy:  Reason for call:   Needs verbal orders on PT one time a week for three weeks.

## 2014-06-12 DIAGNOSIS — I10 Essential (primary) hypertension: Secondary | ICD-10-CM | POA: Diagnosis not present

## 2014-06-12 DIAGNOSIS — N390 Urinary tract infection, site not specified: Secondary | ICD-10-CM | POA: Diagnosis not present

## 2014-06-12 DIAGNOSIS — F0390 Unspecified dementia without behavioral disturbance: Secondary | ICD-10-CM | POA: Diagnosis not present

## 2014-06-12 DIAGNOSIS — G4089 Other seizures: Secondary | ICD-10-CM | POA: Diagnosis not present

## 2014-06-12 DIAGNOSIS — Z823 Family history of stroke: Secondary | ICD-10-CM | POA: Diagnosis not present

## 2014-06-12 DIAGNOSIS — Z9181 History of falling: Secondary | ICD-10-CM | POA: Diagnosis not present

## 2014-06-13 DIAGNOSIS — Z515 Encounter for palliative care: Secondary | ICD-10-CM | POA: Diagnosis not present

## 2014-06-13 DIAGNOSIS — I6930 Unspecified sequelae of cerebral infarction: Secondary | ICD-10-CM | POA: Diagnosis not present

## 2014-06-13 DIAGNOSIS — E440 Moderate protein-calorie malnutrition: Secondary | ICD-10-CM | POA: Diagnosis not present

## 2014-06-13 DIAGNOSIS — F0390 Unspecified dementia without behavioral disturbance: Secondary | ICD-10-CM | POA: Diagnosis not present

## 2014-06-13 DIAGNOSIS — Z87440 Personal history of urinary (tract) infections: Secondary | ICD-10-CM | POA: Diagnosis not present

## 2014-06-18 DIAGNOSIS — N390 Urinary tract infection, site not specified: Secondary | ICD-10-CM | POA: Diagnosis not present

## 2014-06-18 DIAGNOSIS — I10 Essential (primary) hypertension: Secondary | ICD-10-CM | POA: Diagnosis not present

## 2014-06-18 DIAGNOSIS — G4089 Other seizures: Secondary | ICD-10-CM | POA: Diagnosis not present

## 2014-06-18 DIAGNOSIS — Z823 Family history of stroke: Secondary | ICD-10-CM | POA: Diagnosis not present

## 2014-06-18 DIAGNOSIS — F0390 Unspecified dementia without behavioral disturbance: Secondary | ICD-10-CM | POA: Diagnosis not present

## 2014-06-18 DIAGNOSIS — Z9181 History of falling: Secondary | ICD-10-CM | POA: Diagnosis not present

## 2014-06-21 DIAGNOSIS — N390 Urinary tract infection, site not specified: Secondary | ICD-10-CM | POA: Diagnosis not present

## 2014-06-21 DIAGNOSIS — F0390 Unspecified dementia without behavioral disturbance: Secondary | ICD-10-CM | POA: Diagnosis not present

## 2014-06-21 DIAGNOSIS — Z823 Family history of stroke: Secondary | ICD-10-CM | POA: Diagnosis not present

## 2014-06-21 DIAGNOSIS — I10 Essential (primary) hypertension: Secondary | ICD-10-CM | POA: Diagnosis not present

## 2014-06-21 DIAGNOSIS — G4089 Other seizures: Secondary | ICD-10-CM | POA: Diagnosis not present

## 2014-06-21 DIAGNOSIS — Z9181 History of falling: Secondary | ICD-10-CM | POA: Diagnosis not present

## 2014-06-24 DIAGNOSIS — N390 Urinary tract infection, site not specified: Secondary | ICD-10-CM | POA: Diagnosis not present

## 2014-06-24 DIAGNOSIS — Z823 Family history of stroke: Secondary | ICD-10-CM | POA: Diagnosis not present

## 2014-06-24 DIAGNOSIS — I10 Essential (primary) hypertension: Secondary | ICD-10-CM | POA: Diagnosis not present

## 2014-06-24 DIAGNOSIS — G4089 Other seizures: Secondary | ICD-10-CM | POA: Diagnosis not present

## 2014-06-24 DIAGNOSIS — F0390 Unspecified dementia without behavioral disturbance: Secondary | ICD-10-CM | POA: Diagnosis not present

## 2014-06-24 DIAGNOSIS — Z9181 History of falling: Secondary | ICD-10-CM | POA: Diagnosis not present

## 2014-06-25 DIAGNOSIS — G4089 Other seizures: Secondary | ICD-10-CM | POA: Diagnosis not present

## 2014-06-25 DIAGNOSIS — F0390 Unspecified dementia without behavioral disturbance: Secondary | ICD-10-CM | POA: Diagnosis not present

## 2014-06-25 DIAGNOSIS — Z823 Family history of stroke: Secondary | ICD-10-CM | POA: Diagnosis not present

## 2014-06-25 DIAGNOSIS — N390 Urinary tract infection, site not specified: Secondary | ICD-10-CM | POA: Diagnosis not present

## 2014-06-25 DIAGNOSIS — Z9181 History of falling: Secondary | ICD-10-CM | POA: Diagnosis not present

## 2014-06-25 DIAGNOSIS — I10 Essential (primary) hypertension: Secondary | ICD-10-CM | POA: Diagnosis not present

## 2014-06-27 DIAGNOSIS — G4089 Other seizures: Secondary | ICD-10-CM | POA: Diagnosis not present

## 2014-06-27 DIAGNOSIS — Z9181 History of falling: Secondary | ICD-10-CM | POA: Diagnosis not present

## 2014-06-27 DIAGNOSIS — F0390 Unspecified dementia without behavioral disturbance: Secondary | ICD-10-CM | POA: Diagnosis not present

## 2014-06-27 DIAGNOSIS — I10 Essential (primary) hypertension: Secondary | ICD-10-CM | POA: Diagnosis not present

## 2014-06-27 DIAGNOSIS — N390 Urinary tract infection, site not specified: Secondary | ICD-10-CM | POA: Diagnosis not present

## 2014-06-27 DIAGNOSIS — Z823 Family history of stroke: Secondary | ICD-10-CM | POA: Diagnosis not present

## 2014-07-01 DIAGNOSIS — Z9181 History of falling: Secondary | ICD-10-CM | POA: Diagnosis not present

## 2014-07-01 DIAGNOSIS — N390 Urinary tract infection, site not specified: Secondary | ICD-10-CM | POA: Diagnosis not present

## 2014-07-01 DIAGNOSIS — F0390 Unspecified dementia without behavioral disturbance: Secondary | ICD-10-CM | POA: Diagnosis not present

## 2014-07-01 DIAGNOSIS — I10 Essential (primary) hypertension: Secondary | ICD-10-CM | POA: Diagnosis not present

## 2014-07-01 DIAGNOSIS — Z823 Family history of stroke: Secondary | ICD-10-CM | POA: Diagnosis not present

## 2014-07-01 DIAGNOSIS — G4089 Other seizures: Secondary | ICD-10-CM | POA: Diagnosis not present

## 2014-07-02 ENCOUNTER — Telehealth: Payer: Self-pay | Admitting: Internal Medicine

## 2014-07-02 NOTE — Telephone Encounter (Signed)
Spoke with [name], informed him okay to extend PT to one time a week for 4 weeks.

## 2014-07-02 NOTE — Telephone Encounter (Signed)
Caller name: Nitro from [facility] Relation to pt: Call back number: [phone] Pharmacy:  Reason for call:   Requesting extension for PT for one time a week for four weeks.

## 2014-07-03 DIAGNOSIS — I10 Essential (primary) hypertension: Secondary | ICD-10-CM | POA: Diagnosis not present

## 2014-07-03 DIAGNOSIS — Z823 Family history of stroke: Secondary | ICD-10-CM | POA: Diagnosis not present

## 2014-07-03 DIAGNOSIS — F0390 Unspecified dementia without behavioral disturbance: Secondary | ICD-10-CM | POA: Diagnosis not present

## 2014-07-03 DIAGNOSIS — G4089 Other seizures: Secondary | ICD-10-CM | POA: Diagnosis not present

## 2014-07-03 DIAGNOSIS — Z9181 History of falling: Secondary | ICD-10-CM | POA: Diagnosis not present

## 2014-07-03 DIAGNOSIS — N390 Urinary tract infection, site not specified: Secondary | ICD-10-CM | POA: Diagnosis not present

## 2014-07-04 DIAGNOSIS — I10 Essential (primary) hypertension: Secondary | ICD-10-CM | POA: Diagnosis not present

## 2014-07-04 DIAGNOSIS — G4089 Other seizures: Secondary | ICD-10-CM | POA: Diagnosis not present

## 2014-07-04 DIAGNOSIS — N390 Urinary tract infection, site not specified: Secondary | ICD-10-CM | POA: Diagnosis not present

## 2014-07-04 DIAGNOSIS — Z823 Family history of stroke: Secondary | ICD-10-CM | POA: Diagnosis not present

## 2014-07-04 DIAGNOSIS — Z9181 History of falling: Secondary | ICD-10-CM | POA: Diagnosis not present

## 2014-07-04 DIAGNOSIS — F0390 Unspecified dementia without behavioral disturbance: Secondary | ICD-10-CM | POA: Diagnosis not present

## 2014-07-08 ENCOUNTER — Telehealth: Payer: Self-pay

## 2014-07-08 NOTE — Telephone Encounter (Signed)
Needs to be sent by the  prescriber physician

## 2014-07-08 NOTE — Telephone Encounter (Signed)
[facility] on Divalproex (Depakote Sprinkle) 125 MG, do not see where has ever been refilled. Please advise.

## 2014-07-09 DIAGNOSIS — Z9181 History of falling: Secondary | ICD-10-CM | POA: Diagnosis not present

## 2014-07-09 DIAGNOSIS — N390 Urinary tract infection, site not specified: Secondary | ICD-10-CM | POA: Diagnosis not present

## 2014-07-09 DIAGNOSIS — F0390 Unspecified dementia without behavioral disturbance: Secondary | ICD-10-CM | POA: Diagnosis not present

## 2014-07-09 DIAGNOSIS — G4089 Other seizures: Secondary | ICD-10-CM | POA: Diagnosis not present

## 2014-07-09 DIAGNOSIS — Z823 Family history of stroke: Secondary | ICD-10-CM | POA: Diagnosis not present

## 2014-07-09 DIAGNOSIS — I10 Essential (primary) hypertension: Secondary | ICD-10-CM | POA: Diagnosis not present

## 2014-07-09 NOTE — Telephone Encounter (Signed)
[facility] with [facility].

## 2014-07-14 ENCOUNTER — Telehealth: Payer: Self-pay | Admitting: *Deleted

## 2014-07-14 NOTE — Telephone Encounter (Signed)
Dr Marjory LiesPenumalli dictated letter.

## 2014-07-14 NOTE — Telephone Encounter (Signed)
Dr. Drue NovelPaz refused to fill Depakote Sprinkle as he has never refilled, per Dr. Drue NovelPaz original prescriber needs to fill.

## 2014-07-14 NOTE — Telephone Encounter (Signed)
Pt following up, pharmacy has not received the rx for the (depakote sprinkle) pharmacy states they received the request and pt has all other meds except this one. Pt is completely out of meds and needs asap.

## 2014-07-15 MED ORDER — DIVALPROEX SODIUM 125 MG PO CPSP: ORAL_CAPSULE | ORAL | Status: DC

## 2014-07-15 NOTE — Telephone Encounter (Signed)
Pt's daughter states that when pt was discharged from the nursing home, dr. Drue NovelPaz approved everything that was on the discharge list for his meds at his last visit , states dr.paz informed him to continue the depakote but decreased it to twice daily verses 3 times daily that he was doing at the nursing home. Would like for someone to give her a call. Pt needs the meds.

## 2014-07-15 NOTE — Telephone Encounter (Signed)
Daughter has been advised. ?

## 2014-07-15 NOTE — Telephone Encounter (Signed)
Unsure of what to do as Dr. Drue NovelPaz states he has never prescribed Rx and original prescriber needs to fill. Please advise.

## 2014-07-15 NOTE — Addendum Note (Signed)
Addended by: Sandford Craze'SULLIVAN, Anneth Brunell on: 07/15/2014 12:40 PM   Modules accepted: Orders

## 2014-07-15 NOTE — Telephone Encounter (Signed)
Per 06/02/2014 office visit there are instructions for the patient to take the Depakote in lieu of the Seroquel that was making him to sleepy. Though we have never filled it, we approved for the patient to continue and we adjusted the dosing. The patient is no longer in the nursing facility where the doctor prescribed.   Please advise.

## 2014-07-15 NOTE — Telephone Encounter (Signed)
rx sent

## 2014-07-15 NOTE — Telephone Encounter (Signed)
Daughter is Randa Ngooyka Jones she is POA and her number is [phone]

## 2014-07-21 ENCOUNTER — Telehealth: Payer: Self-pay

## 2014-07-21 NOTE — Telephone Encounter (Signed)
Received fax from [facility] informing that "Pt's wife unable to be reached by phone on 07/16/2014 to schedule visit; declined visit on 07/17/2014 due to leaving home to visit son in hospital. Unable to be reached on 07/18/2014 by phone. PT to resume next week as per POC." Form placed in Dr. Leta JunglingPaz's folder to be signed when he returns to office.

## 2014-07-31 NOTE — Telephone Encounter (Signed)
Have you seen this from Dr. Drue NovelPaz?

## 2014-08-23 ENCOUNTER — Other Ambulatory Visit: Payer: Self-pay | Admitting: Family

## 2014-08-25 NOTE — Telephone Encounter (Signed)
See 06/02/14 office note re: depakote dose and advise quantity and refills?

## 2014-08-26 NOTE — Telephone Encounter (Signed)
Rx sent to pharmacy   

## 2014-08-26 NOTE — Telephone Encounter (Signed)
Pt is needing refill on Depakote Sprinkle.  Last OV: 06/02/2014 Last Fill: 07/15/2014 # 60 0RF By Efraim KaufmannMelissa in Dr. Drue NovelPaz absence (see phone note from 07/08/2014)  Please advise.

## 2014-08-26 NOTE — Telephone Encounter (Signed)
Ok 60 and 3 RF 

## 2014-10-03 ENCOUNTER — Encounter: Payer: Self-pay | Admitting: Gastroenterology

## 2014-11-03 ENCOUNTER — Other Ambulatory Visit: Payer: Self-pay | Admitting: Internal Medicine

## 2014-11-17 ENCOUNTER — Other Ambulatory Visit: Payer: Self-pay | Admitting: Internal Medicine

## 2015-01-02 ENCOUNTER — Other Ambulatory Visit: Payer: Self-pay | Admitting: Internal Medicine

## 2015-01-02 NOTE — Telephone Encounter (Signed)
Pt is requesting refill on Keflex.  Last OV: 06/02/2014 Last Fill: 11/03/2014 #30 1RF  Please advise.

## 2015-01-05 ENCOUNTER — Encounter (HOSPITAL_COMMUNITY): Payer: Self-pay | Admitting: Emergency Medicine

## 2015-01-05 ENCOUNTER — Emergency Department (HOSPITAL_COMMUNITY)
Admission: EM | Admit: 2015-01-05 | Discharge: 2015-01-05 | Disposition: A | Discharge status: Home / Self Care | Payer: Medicare Other | Attending: Emergency Medicine | Admitting: Emergency Medicine

## 2015-01-05 ENCOUNTER — Emergency Department (HOSPITAL_COMMUNITY): Payer: Medicare Other

## 2015-01-05 DIAGNOSIS — Z87891 Personal history of nicotine dependence: Secondary | ICD-10-CM | POA: Insufficient documentation

## 2015-01-05 DIAGNOSIS — Z8673 Personal history of transient ischemic attack (TIA), and cerebral infarction without residual deficits: Secondary | ICD-10-CM | POA: Insufficient documentation

## 2015-01-05 DIAGNOSIS — R4189 Other symptoms and signs involving cognitive functions and awareness: Secondary | ICD-10-CM

## 2015-01-05 DIAGNOSIS — Z7982 Long term (current) use of aspirin: Secondary | ICD-10-CM | POA: Insufficient documentation

## 2015-01-05 DIAGNOSIS — F0390 Unspecified dementia without behavioral disturbance: Secondary | ICD-10-CM | POA: Diagnosis not present

## 2015-01-05 DIAGNOSIS — E782 Mixed hyperlipidemia: Secondary | ICD-10-CM | POA: Insufficient documentation

## 2015-01-05 DIAGNOSIS — E119 Type 2 diabetes mellitus without complications: Secondary | ICD-10-CM | POA: Insufficient documentation

## 2015-01-05 DIAGNOSIS — M1990 Unspecified osteoarthritis, unspecified site: Secondary | ICD-10-CM | POA: Diagnosis not present

## 2015-01-05 DIAGNOSIS — I2510 Atherosclerotic heart disease of native coronary artery without angina pectoris: Secondary | ICD-10-CM | POA: Insufficient documentation

## 2015-01-05 DIAGNOSIS — Z86718 Personal history of other venous thrombosis and embolism: Secondary | ICD-10-CM | POA: Diagnosis not present

## 2015-01-05 DIAGNOSIS — N390 Urinary tract infection, site not specified: Secondary | ICD-10-CM | POA: Diagnosis not present

## 2015-01-05 DIAGNOSIS — R4182 Altered mental status, unspecified: Secondary | ICD-10-CM | POA: Diagnosis present

## 2015-01-05 DIAGNOSIS — Z79899 Other long term (current) drug therapy: Secondary | ICD-10-CM | POA: Diagnosis not present

## 2015-01-05 LAB — COMPREHENSIVE METABOLIC PANEL
ALBUMIN: 3.4 g/dL — AB (ref 3.5–5.0)
ALT: 10 U/L — ABNORMAL LOW (ref 17–63)
AST: 20 U/L (ref 15–41)
Alkaline Phosphatase: 101 U/L (ref 38–126)
Anion gap: 10 (ref 5–15)
BUN: 6 mg/dL (ref 6–20)
CHLORIDE: 105 mmol/L (ref 101–111)
CO2: 25 mmol/L (ref 22–32)
Calcium: 9.2 mg/dL (ref 8.9–10.3)
Creatinine, Ser: 0.84 mg/dL (ref 0.61–1.24)
GFR calc Af Amer: >60 mL/min (ref >60)
GFR calc non Af Amer: >60 mL/min (ref >60)
GLUCOSE: 101 mg/dL — AB (ref 65–99)
POTASSIUM: 3.6 mmol/L (ref 3.5–5.1)
Sodium: 140 mmol/L (ref 135–145)
Total Bilirubin: 0.6 mg/dL (ref 0.3–1.2)
Total Protein: 7.3 g/dL (ref 6.5–8.1)

## 2015-01-05 LAB — URINALYSIS, ROUTINE W REFLEX MICROSCOPIC
Bilirubin Urine: NEGATIVE
GLUCOSE, UA: NEGATIVE mg/dL
Hgb urine dipstick: NEGATIVE
Ketones, ur: NEGATIVE mg/dL
LEUKOCYTES UA: NEGATIVE
Nitrite: NEGATIVE
PH: 7 (ref 5.0–8.0)
Protein, ur: NEGATIVE mg/dL
SPECIFIC GRAVITY, URINE: 1.015 (ref 1.005–1.030)
Urobilinogen, UA: 1 mg/dL (ref 0.0–1.0)

## 2015-01-05 LAB — CBC
HEMATOCRIT: 40.7 % (ref 39.0–52.0)
Hemoglobin: 13.6 g/dL (ref 13.0–17.0)
MCH: 29.3 pg (ref 26.0–34.0)
MCHC: 33.4 g/dL (ref 30.0–36.0)
MCV: 87.7 fL (ref 78.0–100.0)
Platelets: 171 10*3/uL (ref 150–400)
RBC: 4.64 MIL/uL (ref 4.22–5.81)
RDW: 14.9 % (ref 11.5–15.5)
WBC: 8.3 10*3/uL (ref 4.0–10.5)

## 2015-01-05 LAB — CBG MONITORING, ED: GLUCOSE-CAPILLARY: 94 mg/dL (ref 65–99)

## 2015-01-05 LAB — POC OCCULT BLOOD, ED: Fecal Occult Bld: NEGATIVE

## 2015-01-05 LAB — AMMONIA: Ammonia: 29 umol/L (ref 9–35)

## 2015-01-05 LAB — I-STAT CG4 LACTIC ACID, ED: Lactic Acid, Venous: 1.43 mmol/L (ref 0.5–2.0)

## 2015-01-05 LAB — VALPROIC ACID LEVEL: VALPROIC ACID LVL: 21 ug/mL — AB (ref 50.0–100.0)

## 2015-01-05 MED ORDER — SODIUM CHLORIDE 0.9 % IV BOLUS (SEPSIS): 500.0000 mL | Freq: Once | INTRAVENOUS | Status: DC

## 2015-01-05 MED ORDER — SODIUM CHLORIDE 0.9 % IV BOLUS (SEPSIS)
1000.0000 mL | Freq: Once | INTRAVENOUS | Status: AC
  Administered 2015-01-05: 1000 mL via INTRAVENOUS

## 2015-01-05 NOTE — ED Provider Notes (Signed)
CSN: [id number]     Arrival date & time 01/05/15  1129 History   First MD Initiated Contact with Patient 01/05/15 1132     Chief Complaint  Patient presents with  . Altered Mental Status     (Consider location/radiation/quality/duration/timing/severity/associated sxs/prior Treatment) HPI Comments: Home health aide found patient non-verbal, unresponsive to stimuli. Improved with EMS - responding to questions to me. Told me it is 61 and he's 81 years old. He doesn't know where he is.  Patient is a 81 y.o. male presenting with altered mental status. The history is provided by the EMS personnel.  Altered Mental Status Presenting symptoms: confusion   Severity:  Moderate Most recent episode:  Yesterday Episode history:  Single Timing:  Constant Progression:  Unchanged Chronicity:  New Context: dementia   Context: not a nursing home resident     Past Medical History  Diagnosis Date  . Stroke 04-2011  . Irregular heart beat   . Dementia   . DVT (deep venous thrombosis)     L leg 04-2011  . Hypercholesteremia   . Arthritis   . Diabetes mellitus   . Infectious colitis 08/07/2012  . CAD (coronary artery disease)     stent 2005  . DJD (degenerative joint disease)   . H/O ETOH abuse   . Recurrent UTI     UTI, MS changes admited 02-2013   Past Surgical History  Procedure Laterality Date  . Left arm    . Rotator cuff repair  2000(L) 1999 (R)   Family History  Problem Relation Age of Onset  . Heart disease Mother   . Diabetes Mother   . Colon cancer Neg Hx   . Prostate cancer Neg Hx   . Heart disease Father    Social History  Substance Use Topics  . Smoking status: Former Smoker -- 1.00 packs/day for 20 years    Types: Cigarettes    Quit date: 06/07/1958  . Smokeless tobacco: Never Used  . Alcohol Use: No     Comment: former abuse    Review of Systems  Unable to perform ROS: Dementia  Psychiatric/Behavioral: Positive for confusion.      Allergies  Review of  patient's allergies indicates no known allergies.  Home Medications   Prior to Admission medications   Medication Sig Start Date End Date Taking? Authorizing Provider  amLODipine (NORVASC) 5 MG tablet Take 1 tablet (5 mg total) by mouth daily. 06/11/14   Wanda Plump, MD  aspirin EC 81 MG tablet Take 81 mg by mouth daily.    Historical Provider, MD  atorvastatin (LIPITOR) 40 MG tablet Take 1 tablet (40 mg total) by mouth daily. 06/11/14   Wanda Plump, MD  baclofen (LIORESAL) 10 MG tablet Take 0.5 tablets (5 mg total) by mouth 2 (two) times daily. 06/02/14   Wanda Plump, MD  cephALEXin (KEFLEX) 250 MG capsule Take 1 capsule (250 mg total) by mouth at bedtime. 01/05/15   Wanda Plump, MD  Cholecalciferol (VITAMIN D PO) Take 1 tablet by mouth daily with lunch.    Historical Provider, MD  divalproex (DEPAKOTE SPRINKLE) 125 MG capsule Take 1 capsule by mouth every morning, may take 1 capsule in the evening as needed for agitation. 08/26/14   Wanda Plump, MD  donepezil (ARICEPT) 5 MG tablet Take 2 tablets (10 mg total) by mouth at bedtime. 11/17/14   Wanda Plump, MD  feeding supplement, GLUCERNA SHAKE, (GLUCERNA SHAKE) LIQD Take 237 mLs by mouth  3 (three) times daily between meals. Patient not taking: Reported on 06/02/2014 05/12/14   Belkys A Regalado, MD  ferrous sulfate 325 (65 FE) MG tablet Take 325 mg by mouth 2 (two) times daily with a meal.    Historical Provider, MD  levothyroxine (SYNTHROID, LEVOTHROID) 25 MCG tablet Take 1 tablet (25 mcg total) by mouth daily before breakfast. 06/11/14   Wanda Plump, MD   SpO2 98% Physical Exam  Constitutional: He appears well-developed and well-nourished. No distress.  HENT:  Head: Normocephalic and atraumatic.  Mouth/Throat: No oropharyngeal exudate.  Eyes: EOM are normal. Pupils are equal, round, and reactive to light.  Neck: Normal range of motion. Neck supple.  Cardiovascular: Normal rate and regular rhythm.  Exam reveals no friction rub.   No murmur  heard. Pulmonary/Chest: Effort normal and breath sounds normal. No respiratory distress. He has no wheezes. He has no rales.  Abdominal: Soft. He exhibits no distension. There is no tenderness. There is no rebound.  Musculoskeletal: Normal range of motion. He exhibits no edema.  Neurological: He is alert.  R arm and leg contracted  Skin: No rash noted. He is not diaphoretic.  Nursing note and vitals reviewed.   ED Course  Procedures (including critical care time) Labs Review Labs Reviewed - No data to display  Imaging Review Ct Head Wo Contrast  01/05/2015   CLINICAL DATA:  Altered mental status. Last seen normal 9 p.m. last evening  EXAM: CT HEAD WITHOUT CONTRAST  TECHNIQUE: Contiguous axial images were obtained from the base of the skull through the vertex without intravenous contrast.  COMPARISON:  05/09/2014  FINDINGS: Prominence of the sulci and ventricles consistent with brain atrophy. There is mild low attenuation within the subcortical and periventricular white matter compatible with chronic microvascular disease. No evidence for acute intracranial hemorrhage, cortical infarct or mass. There is asymmetric opacification of the left maxillary sinus. The remaining paranasal sinuses are clear.  IMPRESSION: 1. No acute intracranial abnormalities. 2. Small vessel ischemic change and brain atrophy. 3. Left maxillary sinus opacification.   Electronically Signed   By: Signa Kell M.D.   On: 01/05/2015 13:24   Dg Chest Portable 1 View  01/05/2015   CLINICAL DATA:  Altered mental status.  EXAM: PORTABLE CHEST 1 VIEW  COMPARISON:  May 08, 2014.  FINDINGS: The heart size and mediastinal contours are within normal limits. No pneumothorax or pleural effusion is noted. Degenerative changes seen involving the left shoulder. Left lung is clear. Mild right basilar subsegmental atelectasis is noted.  IMPRESSION: Mild right basilar subsegmental atelectasis.   Electronically Signed   By: Lupita Raider, M.D.   On: 01/05/2015 12:11   I have personally reviewed and evaluated these images and lab results as part of my medical decision-making.   EKG Interpretation None      MDM   Final diagnoses:  Unresponsive  Dementia, without behavioral disturbance    81 year old male with history of dementia here with altered mental status. Does have history of stroke with some right-sided contraction weakness. Last known normal was last night at 9 PM. He is nonverbal at home, but here he answers questions or follow commands. Vitals stable. No pressure ulcers on exam. Check labs and imaging. Labs okay. Imaging okay. No evidence of infection. I spoke with family, they feel he is back to baseline. He has been weaker recently. He's had admission to this before. Family does not want placement because they do not ever want to get  a nursing home again. I spoke with a hospitalist, they feel this patient does not want admission. I agree. Discussed with patient's family, and instructed to follow-up with primary care doctor. Stable for discharge.    Elwin Mocha, MD 01/05/15 [phone]

## 2015-01-05 NOTE — Telephone Encounter (Signed)
Rx sent 

## 2015-01-05 NOTE — ED Notes (Signed)
Pt here from home with altered mental status, LSN 9pm last night. Pt has previous CVA with right deficits. Pt normally a/o x 4 but only to person, place at this time. EMS reports pt was only responsive to painful stimuli when they first arrived, but now is talking. Pt is c/o no pain.

## 2015-01-05 NOTE — Telephone Encounter (Signed)
On keflex for recurrent UTIs. Okay #30 and 3 refills

## 2015-01-05 NOTE — Discharge Instructions (Signed)
Confusion °Confusion is the inability to think with your usual speed or clarity. Confusion may come on quickly or slowly over time. How quickly the confusion comes on depends on the cause. Confusion can be due to any number of causes. °CAUSES  °· Concussion, head injury, or head trauma. °· Seizures. °· Stroke. °· Fever. °· Brain tumor. °· Age related decreased brain function (dementia). °· Heightened emotional states like rage or terror. °· Mental illness in which the person loses the ability to determine what is real and what is not (hallucinations). °· Infections such as a urinary tract infection (UTI). °· Toxic effects from alcohol, drugs, or prescription medicines. °· Dehydration and an imbalance of salts in the body (electrolytes). °· Lack of sleep. °· Low blood sugar (diabetes). °· Low levels of oxygen from conditions such as chronic lung disorders. °· Drug interactions or other medicine side effects. °· Nutritional deficiencies, especially niacin, thiamine, vitamin C, or vitamin B. °· Sudden drop in body temperature (hypothermia). °· Change in routine, such as when traveling or hospitalized. °SIGNS AND SYMPTOMS  °People often describe their thinking as cloudy or unclear when they are confused. Confusion can also include feeling disoriented. That means you are unaware of where or who you are. You may also not know what the date or time is. If confused, you may also have difficulty paying attention, remembering, and making decisions. Some people also act aggressively when they are confused.  °DIAGNOSIS  °The medical evaluation of confusion may include: °· Blood and urine tests. °· X-rays. °· Brain and nervous system tests. °· Analyzing your brain waves (electroencephalogram or EEG). °· Magnetic resonance imaging (MRI) of your head. °· Computed tomography (CT) scan of your head. °· Mental status tests in which your health care provider may ask many questions. Some of these questions may seem silly or strange,  but they are a very important test to help diagnose and treat confusion. °TREATMENT  °An admission to the hospital may not be needed, but a person with confusion should not be left alone. Stay with a family member or friend until the confusion clears. Avoid alcohol, pain relievers, or sedative drugs until you have fully recovered. Do not drive until directed by your health care provider. °HOME CARE INSTRUCTIONS  °What family and friends can do: °· To find out if someone is confused, ask the person to state his or her name, age, and the date. If the person is unsure or answers incorrectly, he or she is confused. °· Always introduce yourself, no matter how well the person knows you. °· Often remind the person of his or her location. °· Place a calendar and clock near the confused person. °· Help the person with his or her medicines. You may want to use a pill box, an alarm as a reminder, or give the person each dose as prescribed. °· Talk about current events and plans for the day. °· Try to keep the environment calm, quiet, and peaceful. °· Make sure the person keeps follow-up visits with his or her health care provider. °PREVENTION  °Ways to prevent confusion: °· Avoid alcohol. °· Eat a balanced diet. °· Get enough sleep. °· Take medicine only as directed by your health care provider. °· Do not become isolated. Spend time with other people and make plans for your days. °· Keep careful watch on your blood sugar levels if you are diabetic. °SEEK IMMEDIATE MEDICAL CARE IF:  °· You develop severe headaches, repeated vomiting, seizures, blackouts, or   slurred speech. °· There is increasing confusion, weakness, numbness, restlessness, or personality changes. °· You develop a loss of balance, have marked dizziness, feel uncoordinated, or fall. °· You have delusions, hallucinations, or develop severe anxiety. °· Your family members think you need to be rechecked. °Document Released: 05/05/2004 Document Revised: 08/12/2013  Document Reviewed: 05/03/2013 °ExitCare® Patient Information ©2015 [employer]. This information is not intended to replace advice given to you by your health care provider. Make sure you discuss any questions you have with your health care provider. ° °Dementia °Dementia is a general term for problems with brain function. A person with dementia has memory loss and a hard time with at least one other brain function such as thinking, speaking, or problem solving. Dementia can affect social functioning, how you do your job, your mood, or your personality. The changes may be hidden for a long time. The earliest forms of this disease are usually not detected by family or friends. °Dementia can be: °· Irreversible. °· Potentially reversible. °· Partially reversible. °· Progressive. This means it can get worse over time. °CAUSES  °Irreversible dementia causes may include: °· Degeneration of brain cells (Alzheimer disease or Lewy body dementia). °· Multiple small strokes (vascular dementia). °· Infection (chronic meningitis or Creutzfeldt-Jakob disease). °· Frontotemporal dementia. This affects younger people, age 40 to 70, compared to those who have Alzheimer disease. °· Dementia associated with other disorders like Parkinson disease, Huntington disease, or HIV-associated dementia. °Potentially or partially reversible dementia causes may include: °· Medicines. °· Metabolic causes such as excessive alcohol intake, vitamin B12 deficiency, or thyroid disease. °· Masses or pressure in the brain such as a tumor, blood clot, or hydrocephalus. °SIGNS AND SYMPTOMS  °Symptoms are often hard to detect. Family members or coworkers may not notice them early in the disease process. Different people with dementia may have different symptoms. Symptoms can include: °· A hard time with memory, especially recent memory. Long-term memory may not be impaired. °· Asking the same question multiple times or forgetting something someone just  said. °· A hard time speaking your thoughts or finding certain words. °· A hard time solving problems or performing familiar tasks (such as how to use a telephone). °· Sudden changes in mood. °· Changes in personality, especially increasing moodiness or mistrust. °· Depression. °· A hard time understanding complex ideas that were never a problem in the past. °DIAGNOSIS  °There are no specific tests for dementia.  °· Your health care provider may recommend a thorough evaluation. This is because some forms of dementia can be reversible. The evaluation will likely include a physical exam and getting a detailed history from you and a family member. The history often gives the best clues and suggestions for a diagnosis. °· Memory testing may be done. A detailed brain function evaluation called neuropsychologic testing may be helpful. °· Lab tests and brain imaging (such as a CT scan or MRI scan) are sometimes important. °· Sometimes observation and re-evaluation over time is very helpful. °TREATMENT  °Treatment depends on the cause.  °· If the problem is a vitamin deficiency, it may be helped or cured with supplements. °· For dementias such as Alzheimer disease, medicines are available to stabilize or slow the course of the disease. There are no cures for this type of dementia. °· Your health care provider can help direct you to groups, organizations, and other health care providers to help with decisions in the care of you or your loved one. °HOME CARE   INSTRUCTIONS °The care of individuals with dementia is varied and dependent upon the progression of the dementia. The following suggestions are intended for the person living with, or caring for, the person with dementia. °· Create a safe environment. °¨ Remove the locks on bathroom doors to prevent the person from accidentally locking himself or herself in. °¨ Use childproof latches on kitchen cabinets and any place where cleaning supplies, chemicals, or alcohol are  kept. °¨ Use childproof covers in unused electrical outlets. °¨ Install childproof devices to keep doors and windows secured. °¨ Remove stove knobs or install safety knobs and an automatic shut-off on the stove. °¨ Lower the temperature on water heaters. °¨ Label medicines and keep them locked up. °¨ Secure knives, lighters, matches, power tools, and guns, and keep these items out of reach. °¨ Keep the house free from clutter. Remove rugs or anything that might contribute to a fall. °¨ Remove objects that might break and hurt the person. °¨ Make sure lighting is good, both inside and outside. °¨ Install grab rails as needed. °¨ Use a monitoring device to alert you to falls or other needs for help. °· Reduce confusion. °¨ Keep familiar objects and people around. °¨ Use night lights or dim lights at night. °¨ Label items or areas. °¨ Use reminders, notes, or directions for daily activities or tasks. °¨ Keep a simple, consistent routine for waking, meals, bathing, dressing, and bedtime. °¨ Create a calm, quiet environment. °¨ Place large clocks and calendars prominently. °¨ Display emergency numbers and home address near all telephones. °¨ Use cues to establish different times of the day. An example is to open curtains to let the natural light in during the day.   °· Use effective communication. °¨ Choose simple words and short sentences. °¨ Use a gentle, calm tone of voice. °¨ Be careful not to interrupt. °¨ If the person is struggling to find a word or communicate a thought, try to provide the word or thought. °¨ Ask one question at a time. Allow the person ample time to answer questions. Repeat the question again if the person does not respond. °· Reduce nighttime restlessness. °¨ Provide a comfortable bed. °¨ Have a consistent nighttime routine. °¨ Ensure a regular walking or physical activity schedule. Involve the person in daily activities as much as possible. °¨ Limit napping during the day. °¨ Limit  caffeine. °¨ Attend social events that stimulate rather than overwhelm the senses. °· Encourage good nutrition and hydration. °¨ Reduce distractions during meal times and snacks. °¨ Avoid foods that are too hot or too cold. °¨ Monitor chewing and swallowing ability. °· Continue with routine vision, hearing, dental, and medical screenings. °· Give medicines only as directed by the health care provider. °· Monitor driving abilities. Do not allow the person to drive when safe driving is no longer possible. °· Register with an identification program which could provide location assistance in the event of a missing person situation. °SEEK MEDICAL CARE IF:  °· New behavioral problems start such as moodiness, aggressiveness, or seeing things that are not there (hallucinations). °· Any new problem with brain function happens. This includes problems with balance, speech, or falling a lot. °· Problems with swallowing develop. °· Any symptoms of other illness happen. °Small changes or worsening in any aspect of brain function can be a sign that the illness is getting worse. It can also be a sign of another medical illness such as infection. Seeing a health care provider right   away is important. °SEEK IMMEDIATE MEDICAL CARE IF:  °· A fever develops. °· New or worsened confusion develops. °· New or worsened sleepiness develops. °· Staying awake becomes hard to do. °Document Released: 09/21/2000 Document Revised: 08/12/2013 Document Reviewed: 08/23/2010 °ExitCare® Patient Information ©2015 [employer]. This information is not intended to replace advice given to you by your health care provider. Make sure you discuss any questions you have with your health care provider. ° °

## 2015-01-06 LAB — URINE CULTURE
Culture: NO GROWTH
Special Requests: NORMAL

## 2015-02-05 ENCOUNTER — Other Ambulatory Visit: Payer: Self-pay | Admitting: Internal Medicine

## 2015-02-23 ENCOUNTER — Other Ambulatory Visit: Payer: Self-pay | Admitting: Internal Medicine

## 2015-03-02 ENCOUNTER — Other Ambulatory Visit: Payer: Self-pay

## 2015-03-02 MED ORDER — BACLOFEN 10 MG PO TABS: 5.0000 mg | ORAL_TABLET | Freq: Two times a day (BID) | ORAL | Status: DC

## 2015-03-09 ENCOUNTER — Other Ambulatory Visit: Payer: Self-pay | Admitting: Internal Medicine

## 2015-03-16 ENCOUNTER — Encounter (HOSPITAL_COMMUNITY): Payer: Self-pay | Admitting: *Deleted

## 2015-03-16 ENCOUNTER — Other Ambulatory Visit: Payer: Self-pay | Admitting: Internal Medicine

## 2015-03-16 ENCOUNTER — Encounter: Payer: Self-pay | Admitting: Internal Medicine

## 2015-03-16 ENCOUNTER — Telehealth: Payer: Self-pay | Admitting: Behavioral Health

## 2015-03-16 ENCOUNTER — Ambulatory Visit (HOSPITAL_BASED_OUTPATIENT_CLINIC_OR_DEPARTMENT_OTHER)
Admission: RE | Admit: 2015-03-16 | Discharge: 2015-03-16 | Disposition: A | Discharge status: Home / Self Care | Payer: Medicare Other | Source: Ambulatory Visit | Attending: Internal Medicine | Admitting: Internal Medicine

## 2015-03-16 ENCOUNTER — Ambulatory Visit (INDEPENDENT_AMBULATORY_CARE_PROVIDER_SITE_OTHER): Payer: Medicare Other | Admitting: Internal Medicine

## 2015-03-16 ENCOUNTER — Observation Stay (HOSPITAL_BASED_OUTPATIENT_CLINIC_OR_DEPARTMENT_OTHER)
Admission: AD | Admit: 2015-03-16 | Discharge: 2015-03-19 | Disposition: A | Discharge status: Skilled Nursing Facility | Payer: Medicare Other | Source: Ambulatory Visit | Attending: Internal Medicine | Admitting: Internal Medicine

## 2015-03-16 VITALS — BP 130/88 | HR 111 | Temp 98.3°F

## 2015-03-16 DIAGNOSIS — F0151 Vascular dementia with behavioral disturbance: Secondary | ICD-10-CM | POA: Diagnosis not present

## 2015-03-16 DIAGNOSIS — I82411 Acute embolism and thrombosis of right femoral vein: Secondary | ICD-10-CM | POA: Diagnosis not present

## 2015-03-16 DIAGNOSIS — I10 Essential (primary) hypertension: Secondary | ICD-10-CM

## 2015-03-16 DIAGNOSIS — Z86718 Personal history of other venous thrombosis and embolism: Secondary | ICD-10-CM | POA: Diagnosis not present

## 2015-03-16 DIAGNOSIS — E785 Hyperlipidemia, unspecified: Secondary | ICD-10-CM | POA: Diagnosis not present

## 2015-03-16 DIAGNOSIS — Z87891 Personal history of nicotine dependence: Secondary | ICD-10-CM | POA: Insufficient documentation

## 2015-03-16 DIAGNOSIS — Z7982 Long term (current) use of aspirin: Secondary | ICD-10-CM | POA: Diagnosis not present

## 2015-03-16 DIAGNOSIS — Z8673 Personal history of transient ischemic attack (TIA), and cerebral infarction without residual deficits: Secondary | ICD-10-CM | POA: Diagnosis not present

## 2015-03-16 DIAGNOSIS — R739 Hyperglycemia, unspecified: Secondary | ICD-10-CM

## 2015-03-16 DIAGNOSIS — M1990 Unspecified osteoarthritis, unspecified site: Secondary | ICD-10-CM | POA: Insufficient documentation

## 2015-03-16 DIAGNOSIS — F0391 Unspecified dementia with behavioral disturbance: Secondary | ICD-10-CM

## 2015-03-16 DIAGNOSIS — Z79899 Other long term (current) drug therapy: Secondary | ICD-10-CM | POA: Insufficient documentation

## 2015-03-16 DIAGNOSIS — E7800 Pure hypercholesterolemia, unspecified: Secondary | ICD-10-CM | POA: Insufficient documentation

## 2015-03-16 DIAGNOSIS — R809 Proteinuria, unspecified: Secondary | ICD-10-CM

## 2015-03-16 DIAGNOSIS — E119 Type 2 diabetes mellitus without complications: Secondary | ICD-10-CM | POA: Insufficient documentation

## 2015-03-16 DIAGNOSIS — I82401 Acute embolism and thrombosis of unspecified deep veins of right lower extremity: Principal | ICD-10-CM | POA: Diagnosis present

## 2015-03-16 DIAGNOSIS — F01518 Vascular dementia, unspecified severity, with other behavioral disturbance: Secondary | ICD-10-CM | POA: Diagnosis present

## 2015-03-16 DIAGNOSIS — I2510 Atherosclerotic heart disease of native coronary artery without angina pectoris: Secondary | ICD-10-CM | POA: Diagnosis not present

## 2015-03-16 DIAGNOSIS — E039 Hypothyroidism, unspecified: Secondary | ICD-10-CM | POA: Diagnosis not present

## 2015-03-16 DIAGNOSIS — E038 Other specified hypothyroidism: Secondary | ICD-10-CM

## 2015-03-16 DIAGNOSIS — M7989 Other specified soft tissue disorders: Secondary | ICD-10-CM

## 2015-03-16 DIAGNOSIS — Z09 Encounter for follow-up examination after completed treatment for conditions other than malignant neoplasm: Secondary | ICD-10-CM

## 2015-03-16 DIAGNOSIS — Z792 Long term (current) use of antibiotics: Secondary | ICD-10-CM | POA: Insufficient documentation

## 2015-03-16 DIAGNOSIS — I5030 Unspecified diastolic (congestive) heart failure: Secondary | ICD-10-CM | POA: Insufficient documentation

## 2015-03-16 DIAGNOSIS — E1129 Type 2 diabetes mellitus with other diabetic kidney complication: Secondary | ICD-10-CM | POA: Diagnosis not present

## 2015-03-16 DIAGNOSIS — I639 Cerebral infarction, unspecified: Secondary | ICD-10-CM | POA: Diagnosis present

## 2015-03-16 DIAGNOSIS — F0390 Unspecified dementia without behavioral disturbance: Secondary | ICD-10-CM | POA: Diagnosis present

## 2015-03-16 DIAGNOSIS — Z794 Long term (current) use of insulin: Secondary | ICD-10-CM

## 2015-03-16 LAB — CBC WITH DIFFERENTIAL/PLATELET
BASOS PCT: 0 %
Basophils Absolute: 0.1 10*3/uL (ref 0.0–0.1)
EOS ABS: 0.4 10*3/uL (ref 0.0–0.7)
EOS PCT: 4 %
HCT: 36.2 % — ABNORMAL LOW (ref 39.0–52.0)
HEMOGLOBIN: 11.7 g/dL — AB (ref 13.0–17.0)
LYMPHS ABS: 2.7 10*3/uL (ref 0.7–4.0)
Lymphocytes Relative: 24 %
MCH: 28.5 pg (ref 26.0–34.0)
MCHC: 32.3 g/dL (ref 30.0–36.0)
MCV: 88.3 fL (ref 78.0–100.0)
MONOS PCT: 7 %
Monocytes Absolute: 0.8 10*3/uL (ref 0.1–1.0)
NEUTROS PCT: 65 %
Neutro Abs: 7.6 10*3/uL (ref 1.7–7.7)
PLATELETS: 206 10*3/uL (ref 150–400)
RBC: 4.1 MIL/uL — AB (ref 4.22–5.81)
RDW: 15.3 % (ref 11.5–15.5)
WBC: 11.6 10*3/uL — AB (ref 4.0–10.5)

## 2015-03-16 LAB — COMPREHENSIVE METABOLIC PANEL
ALBUMIN: 3.6 g/dL (ref 3.5–5.0)
ALT: 23 U/L (ref 17–63)
ANION GAP: 9 (ref 5–15)
AST: 19 U/L (ref 15–41)
Alkaline Phosphatase: 119 U/L (ref 38–126)
BUN: 9 mg/dL (ref 6–20)
CHLORIDE: 107 mmol/L (ref 101–111)
CO2: 26 mmol/L (ref 22–32)
Calcium: 8.9 mg/dL (ref 8.9–10.3)
Creatinine, Ser: 0.76 mg/dL (ref 0.61–1.24)
GFR calc non Af Amer: >60 mL/min (ref >60)
GLUCOSE: 108 mg/dL — AB (ref 65–99)
POTASSIUM: 3.2 mmol/L — AB (ref 3.5–5.1)
SODIUM: 142 mmol/L (ref 135–145)
Total Bilirubin: 0.5 mg/dL (ref 0.3–1.2)
Total Protein: 7.7 g/dL (ref 6.5–8.1)

## 2015-03-16 LAB — APTT: APTT: 31 s (ref 24–37)

## 2015-03-16 LAB — PROTIME-INR
INR: 1.12 (ref 0.00–1.49)
Prothrombin Time: 14.6 seconds (ref 11.6–15.2)

## 2015-03-16 MED ORDER — SODIUM CHLORIDE 0.9 % IJ SOLN
3.0000 mL | Freq: Two times a day (BID) | INTRAMUSCULAR | Status: DC
  Administered 2015-03-16 – 2015-03-19 (×3): 3 mL via INTRAVENOUS

## 2015-03-16 MED ORDER — OXYCODONE-ACETAMINOPHEN 5-325 MG PO TABS: 1.0000 | ORAL_TABLET | Freq: Four times a day (QID) | ORAL | Status: DC | PRN

## 2015-03-16 MED ORDER — LEVOTHYROXINE SODIUM 25 MCG PO TABS
25.0000 ug | ORAL_TABLET | Freq: Every day | ORAL | Status: DC
  Administered 2015-03-17 – 2015-03-19 (×3): 25 ug via ORAL
  Filled 2015-03-16 (×3): qty 1

## 2015-03-16 MED ORDER — DONEPEZIL HCL 10 MG PO TABS
10.0000 mg | ORAL_TABLET | Freq: Every day | ORAL | Status: DC
  Administered 2015-03-16 – 2015-03-18 (×3): 10 mg via ORAL
  Filled 2015-03-16 (×4): qty 1

## 2015-03-16 MED ORDER — ACETAMINOPHEN 325 MG PO TABS: 650.0000 mg | ORAL_TABLET | Freq: Four times a day (QID) | ORAL | Status: DC | PRN

## 2015-03-16 MED ORDER — AMLODIPINE BESYLATE 5 MG PO TABS
5.0000 mg | ORAL_TABLET | Freq: Every day | ORAL | Status: DC
  Administered 2015-03-17 – 2015-03-19 (×3): 5 mg via ORAL
  Filled 2015-03-16 (×3): qty 1

## 2015-03-16 MED ORDER — HEPARIN (PORCINE) IN NACL 100-0.45 UNIT/ML-% IJ SOLN
700.0000 [IU]/h | INTRAMUSCULAR | Status: DC
Start: 2015-03-17 — End: 2015-03-17
  Administered 2015-03-17: 950 [IU]/h via INTRAVENOUS
  Filled 2015-03-16: qty 250

## 2015-03-16 MED ORDER — ONDANSETRON HCL 4 MG PO TABS: 4.0000 mg | ORAL_TABLET | Freq: Four times a day (QID) | ORAL | Status: DC | PRN

## 2015-03-16 MED ORDER — DIVALPROEX SODIUM 125 MG PO CSDR
125.0000 mg | DELAYED_RELEASE_CAPSULE | Freq: Two times a day (BID) | ORAL | Status: DC
  Administered 2015-03-16 – 2015-03-19 (×6): 125 mg via ORAL
  Filled 2015-03-16 (×7): qty 1

## 2015-03-16 MED ORDER — ONDANSETRON HCL 4 MG/2ML IJ SOLN: 4.0000 mg | Freq: Four times a day (QID) | INTRAMUSCULAR | Status: DC | PRN

## 2015-03-16 MED ORDER — HEPARIN BOLUS VIA INFUSION
3000.0000 [IU] | Freq: Once | INTRAVENOUS | Status: DC
  Administered 2015-03-17: 3000 [IU] via INTRAVENOUS
  Filled 2015-03-16: qty 3000

## 2015-03-16 MED ORDER — BACLOFEN 5 MG HALF TABLET
5.0000 mg | ORAL_TABLET | Freq: Two times a day (BID) | ORAL | Status: DC
  Administered 2015-03-16 – 2015-03-19 (×6): 5 mg via ORAL
  Filled 2015-03-16 (×7): qty 1

## 2015-03-16 MED ORDER — CHOLECALCIFEROL 10 MCG (400 UNIT) PO TABS
400.0000 [IU] | ORAL_TABLET | Freq: Every day | ORAL | Status: DC
  Administered 2015-03-17 – 2015-03-19 (×3): 400 [IU] via ORAL
  Filled 2015-03-16 (×3): qty 1

## 2015-03-16 MED ORDER — ATORVASTATIN CALCIUM 40 MG PO TABS
40.0000 mg | ORAL_TABLET | Freq: Every day | ORAL | Status: DC
  Administered 2015-03-16 – 2015-03-19 (×4): 40 mg via ORAL
  Filled 2015-03-16 (×4): qty 1

## 2015-03-16 MED ORDER — ACETAMINOPHEN 650 MG RE SUPP: 650.0000 mg | Freq: Four times a day (QID) | RECTAL | Status: DC | PRN

## 2015-03-16 MED ORDER — ASPIRIN EC 81 MG PO TBEC
81.0000 mg | DELAYED_RELEASE_TABLET | Freq: Every day | ORAL | Status: DC
  Administered 2015-03-17 – 2015-03-19 (×3): 81 mg via ORAL
  Filled 2015-03-16 (×3): qty 1

## 2015-03-16 MED ORDER — GLUCERNA SHAKE PO LIQD
237.0000 mL | Freq: Three times a day (TID) | ORAL | Status: DC
  Administered 2015-03-17 – 2015-03-19 (×7): 237 mL via ORAL
  Filled 2015-03-16 (×9): qty 237

## 2015-03-16 NOTE — Telephone Encounter (Signed)
Caller: Jasmine DecemberSharon from Ultrasound at [facility]  Reason for call: Results  Per Jasmine DecemberSharon, she reported that the patient has clots from the hip to ankle. Report is being sent at this time.

## 2015-03-16 NOTE — Progress Notes (Signed)
ANTICOAGULATION CONSULT NOTE - Initial Consult  Pharmacy Consult for Heparin Indication: DVT  No Known Allergies  Patient Measurements: Height: 5\' 7"  (170.2 cm) Weight: 126 lb (57.153 kg) IBW/kg (Calculated) : 66.1 Heparin Dosing Weight: actual body weight  Vital Signs: Temp: 97.9 F (36.6 C) (12/05 2120) Temp Source: Oral (12/05 2120) BP: 125/91 mmHg (12/05 2120) Pulse Rate: 76 (12/05 2120)  Labs: No results for input(s): HGB, HCT, PLT, APTT, LABPROT, INR, HEPARINUNFRC, CREATININE, CKTOTAL, CKMB, TROPONINI in the last 72 hours.  CrCl cannot be calculated (Patient has no serum creatinine result on file.).   Medical History: Past Medical History  Diagnosis Date  . Stroke (HCC) 04-2011  . Irregular heart beat   . Dementia   . DVT (deep venous thrombosis) (HCC)     L leg 04-2011  . Hypercholesteremia   . Arthritis   . Diabetes mellitus   . Infectious colitis 08/07/2012  . CAD (coronary artery disease)     stent 2005  . DJD (degenerative joint disease)   . H/O ETOH abuse   . Recurrent UTI     UTI, MS changes admited 02-2013    Assessment: [age] y/oM with PMH of dementia, stroke, L leg DVT, DM, CAD DJD who presented to MD office today with CC of R leg swelling and mild redness. Venous doppler ultrasound shows extensive, occlusive DVT of right lower extremity from the common femoral vein to the posterior tibial vein. [facility] and pharmacy consulted to assist with dosing of heparin infusion for DVT. Patient not on any anticoagulants PTA.  Today, 03/16/2015:  Baseline coags, CBC, CMET pending  Goal of Therapy:  Heparin level 0.3-0.7 units/ml Monitor platelets by anticoagulation protocol: Yes   Plan:   Heparin 3000 unit IV bolus x 1, then start heparin infusion at 950 units/hr.  F/u baseline labs.  Heparin level 8 hours after infusion started.  Daily heparin level and CBC while on heparin infusion.  Monitor closely for s/s of bleeding.   Greer PickerelJigna  Danise Dehne, PharmD, BCPS Pager: [phone] 03/16/2015 10:26 PM

## 2015-03-16 NOTE — Assessment & Plan Note (Signed)
Lower extremity swelling: DDX DVT, ? Gout ( the swelling is more noticeable distally), early cellulitis, etc. Plan: CBC, uric acid, stat ultrasound rule out DVT.  If + DVT, the patient family likes to start anticoagulation. Further advice w/ results Decreased pedal pulses : We'll order a ABI at a later time DM: Check A1c Hypothyroidism check a TSH RTC later on today after the ultrasound. Primary care-- had a flu shot   Addendum: Ultrasound show a large DVT right leg, I was discussing the treatment options with the patient's wife and daughter with oral anticoagulants but they strongly requested a hospital admission. The only caregiver is the patient's wife, she has a shoulder injury, monitor him at home given risks of anticoagulation is a challenge. I talked with the ER doctor and subsequently with Dr. Darnelle Catalanama. Dr. Darnelle Catalanama accepted the patient  a direct admission to telemetry, [facility].
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
## 2015-03-16 NOTE — H&P (Signed)
[facility] History and Physical  Dow ChemicalWillie Rangel Mcwatters Jr. WUJ:[id number] DOB: 05/08/1931 DOA: 03/16/2015  Referring physician: ED physician PCP: Willow OraJose Paz, MD  Specialists:   Chief Complaint: Right leg swelling and pain.  HPI: Raymond SoxWillie Rangel Raymond Rangel Jr. is a 81 y.o. male with PMH of dementia, hyperlipidemia, diet-controlled diabetes, stroke, left leg DVT 2013, CAD, DJD, diastolic congestive heart failure, hypothyroidism, who presents with right leg swelling and pain.  Direct admission from Dr. Drue NovelPaz.  Patient has dementia, is unable to provide accurate medical history, therefore, most of the history is from his PCP's note and partially from patient himself. Per PCP, Dr. Leta JunglingPaz's note, patient has been having right leg swelling and pain in the past 3 days, which has been worsened today. His R leg became mildly red ness since yesterday. No hx of injury, cut or bleeding. Dr. Drue NovelPaz ordered LE venous doppler, which showed extensive, occlusive deep venous thrombosis of the right lower extremity from the common femoral vein to the posterior tibial vein. Dr. Drue NovelPaz discussed the treatment options with the patient's wife and daughter with oral anticoagulants, but they strongly requested a hospital admission because they can not take care Raymond Rangel at home. The only caregiver is the patient's wife, who has a shoulder injury, monitoring Raymond Rangel him at home is too challenge for them. When I saw Raymond Rangel on the floor. Patient denies chest pain, shortness of breath, abdominal pain, diarrhea, symptoms of UTI. He moves all extremities.  Where does patient live?   At home    Can patient participate in ADLs?  None  Review of Systems:   General: no fevers, chills, no changes in body weight, has fatigue HEENT: no blurry vision, hearing changes or sore throat Pulm: no dyspnea, coughing, wheezing CV: no chest pain, palpitations Abd: no nausea, vomiting, abdominal pain, diarrhea, constipation GU: no dysuria, burning on urination, increased urinary  frequency, hematuria  Ext: R leg pain and swelling Neuro: no unilateral weakness, numbness, or tingling, no vision change or hearing loss Skin: no rash MSK: No muscle spasm, no deformity, no limitation of range of movement in spin Heme: No easy bruising.  Travel history: No recent long distant travel.  Allergy: No Known Allergies  Past Medical History  Diagnosis Date  . Stroke (HCC) 04-2011  . Irregular heart beat   . Dementia   . DVT (deep venous thrombosis) (HCC)     L leg 04-2011  . Hypercholesteremia   . Arthritis   . Diabetes mellitus   . Infectious colitis 08/07/2012  . CAD (coronary artery disease)     stent 2005  . DJD (degenerative joint disease)   . H/O ETOH abuse   . Recurrent UTI     UTI, MS changes admited 02-2013    Past Surgical History  Procedure Laterality Date  . Left arm    . Rotator cuff repair  2000(L) 1999 (R)    Social History:  reports that he quit smoking about 56 years ago. His smoking use included Cigarettes. He has a 20 pack-year smoking history. He has never used smokeless tobacco. He reports that he does not drink alcohol or use illicit drugs.  Family History:  Family History  Problem Relation Age of Onset  . Heart disease Mother   . Diabetes Mother   . Colon cancer Neg Hx   . Prostate cancer Neg Hx   . Heart disease Father      Prior to Admission medications   Medication Sig Start Date End Date Taking?  Authorizing Provider  amLODipine (NORVASC) 5 MG tablet Take 1 tablet (5 mg total) by mouth daily. 02/23/15  Yes Wanda Plump, MD  aspirin EC 81 MG tablet Take 81 mg by mouth daily.   Yes Historical Provider, MD  atorvastatin (LIPITOR) 40 MG tablet Take 1 tablet (40 mg total) by mouth daily. 06/11/14  Yes Wanda Plump, MD  baclofen (LIORESAL) 10 MG tablet Take 0.5 tablets (5 mg total) by mouth 2 (two) times daily. 03/02/15  Yes Wanda Plump, MD  cephALEXin (KEFLEX) 250 MG capsule Take 1 capsule (250 mg total) by mouth at bedtime. 01/05/15  Yes Wanda Plump, MD  Cholecalciferol (VITAMIN D PO) Take 1 tablet by mouth daily with lunch.   Yes Historical Provider, MD  divalproex (DEPAKOTE SPRINKLE) 125 MG capsule TAKE 1 CAPSULE BY MOUTH EVERY MORNING, MAY TAKE 1CAPSULE IN THE EVENING AS NEEDED FOR AGITATION. 02/23/15  Yes Wanda Plump, MD  donepezil (ARICEPT) 5 MG tablet Take 2 tablets (10 mg total) by mouth at bedtime. 11/17/14  Yes Wanda Plump, MD  FLUVIRIN 0.5 ML SUSY Inject 1 Dose as directed once. 02/12/15  Yes Historical Provider, MD  levothyroxine (SYNTHROID, LEVOTHROID) 25 MCG tablet Take 1 tablet (25 mcg total) by mouth daily before breakfast. 06/11/14  Yes Wanda Plump, MD  feeding supplement, GLUCERNA SHAKE, (GLUCERNA SHAKE) LIQD Take 237 mLs by mouth 3 (three) times daily between meals. 05/12/14   Alba Cory, MD    Physical Exam: Filed Vitals:   03/16/15 1911 03/16/15 2054 03/16/15 2120  BP: 133/71 136/82 125/91  Pulse: 95 80 76  Temp: 98.1 F (36.7 C) 98.5 F (36.9 C) 97.9 F (36.6 C)  TempSrc: Axillary Axillary Oral  Resp: Height:  (1.702 m)    Weight: 57.153 kg (126 lb)    SpO2:  95% 99%   General: Not in acute distress HEENT:       Eyes: PERRL, EOMI, no scleral icterus.       ENT: No discharge from the ears and nose, no pharynx injection, no tonsillar enlargement.        Neck: No JVD, no bruit, no mass felt. Heme: No neck lymph node enlargement. Cardiac: S1/S2, RRR, No murmurs, No gallops or rubs. Pulm: Good air movement bilaterally. No rales, wheezing, rhonchi or rubs. Abd: Soft, nondistended, nontender, no rebound pain, no organomegaly, BS present. Ext: 1+DP/Raymond Rangel pulse bilaterally. Right leg is swollen, slightly TTP. No redness or warmth to my examination. Musculoskeletal: No joint deformities, No joint redness or warmth, no limitation of ROM in spin. Skin: No rashes.  Neuro: Alert, oriented X3, cranial nerves II-XII grossly intact, muscle strength 5/5 in all extremities Psych: Patient is not psychotic, no  suicidal or hemocidal ideation.  Labs on Admission:  Basic Metabolic Panel:  Recent Labs Lab 03/16/15 2225  NA 142  K 3.2*  CL 107  CO2 26  GLUCOSE 108*  BUN 9  CREATININE 0.76  CALCIUM 8.9   Liver Function Tests:  Recent Labs Lab 03/16/15 2225  AST 19  ALT 23  ALKPHOS 119  BILITOT 0.5  PROT 7.7  ALBUMIN 3.6   No results for input(s): LIPASE, AMYLASE in the last 168 hours. No results for input(s): AMMONIA in the last 168 hours. CBC:  Recent Labs Lab 03/16/15 2225  WBC 11.6*  NEUTROABS 7.6  HGB 11.7*  HCT 36.2*  MCV 88.3  PLT 206   Cardiac Enzymes: No results for  input(s): CKTOTAL, CKMB, CKMBINDEX, TROPONINI in the last 168 hours.  BNP (last 3 results) No results for input(s): BNP in the last 8760 hours.  ProBNP (last 3 results) No results for input(s): PROBNP in the last 8760 hours.  CBG: No results for input(s): GLUCAP in the last 168 hours.  Radiological Exams on Admission: US Venous Img Lower Unilateral Right  03/16/2015  CLINICAL DATA:  Right lower extremity swelling and erythema EXAM: RIGHT LOWER EXTREMITY VENOUS DOPPLER ULTRASOUND TECHNIQUE: Gray-scale sonography with graded compression, as well as color Doppler and duplex ultrasound were performed to evaluate the lower extremity deep venous systems from the level of the common femoral vein and including the common femoral, femoral, profunda femoral, popliteal and calf veins including the posterior tibial, peroneal and gastrocnemius veins when visible. The superficial great saphenous vein was also interrogated. Spectral Doppler was utilized to evaluate flow at rest and with distal augmentation maneuvers in the common femoral, femoral and popliteal veins. COMPARISON:  None. FINDINGS: Contralateral Common Femoral Vein: Respiratory phasicity is normal and symmetric with the symptomatic side. No evidence of thrombus. Normal compressibility. Common Femoral Vein: Noncompressible with occlusive thrombus  present. Saphenofemoral Junction: Noncompressible with occlusive thrombus present. Profunda Femoral Vein: Noncompressible with occlusive thrombus present. Femoral Vein: Noncompressible with occlusive thrombus present. Popliteal Vein: Noncompressible with occlusive thrombus present. Calf Veins: Posterior tibial vein is noncompressible with occlusive thrombus present. Superficial Great Saphenous Vein: Not visualized. Other Findings:  None. IMPRESSION: Extensive, occlusive deep venous thrombosis of the right lower extremity from the common femoral vein to the posterior tibial vein. Electronically Signed   By: Elige Ko   On: 03/16/2015 16:53    EKG: Independently reviewed.  Abnormal findings:           Not done in ED, will get one.   Assessment/Plan Principal Problem:   Right leg DVT (HCC) Active Problems:   CAD (coronary artery disease)   Hyperlipidemia   Dementia   Diabetes mellitus, type 2 (HCC)   CVA (cerebral infarction)   Vascular dementia with behavior disturbance   Hypothyroidism  Right leg DVT (HCC): patient developed extensive DVT in the right leg as evidenced by LE venous Doppler. Patient does not have signs of pulmonary embolism. Hemodynamically stable.  -will admit for observation -start IV heparin now. will switch to oral med if no acute issues tomorrow. -observe respiratory function closely -when necessary Percocet for pain -check CBC and CMP  CAD (coronary artery disease): no chest pain -EKG -continue aspirin and Lipitor  HLD: Last LDL was 99 on 07/02/12 -Continue home medications: Lipitor -Check FLP  Dementia: -continue the omeprazole  Diet controlled Diabetes mellitus, type 2 (HCC): A1c was 6.6 on 05/09/14. -CBG every morning  History of CVA (cerebral infarction): -Aspirin and Lipitor  Hypothyroidism: Last TSH was 2.981 on 05/09/14 -Continue home Synthroid  Hypertension: -Amlodipine  DVT ppx: ON IV Heparin  Code Status: Full code (not family members at  the bedside, assume Full code) Family Communication: None at bed side. Disposition Plan: Admit to inpatient   Date of Service 03/16/2015    [name] [facility] Pager [phone]  If 7PM-7AM, please contact night-coverage [email or web address] Password [email or web address] 03/16/2015, 10:58 PM

## 2015-03-16 NOTE — Progress Notes (Signed)
Pre visit review using our clinic review tool, if applicable. No additional management support is needed unless otherwise documented below in the visit note. 

## 2015-03-16 NOTE — Patient Instructions (Addendum)
Get your blood work now  Get the ultrasound at 3:30 pm  today atd the first floor  After the ultrasound please come back to this office     [facility]

## 2015-03-17 DIAGNOSIS — E118 Type 2 diabetes mellitus with unspecified complications: Secondary | ICD-10-CM

## 2015-03-17 DIAGNOSIS — I2510 Atherosclerotic heart disease of native coronary artery without angina pectoris: Secondary | ICD-10-CM | POA: Diagnosis not present

## 2015-03-17 DIAGNOSIS — F0390 Unspecified dementia without behavioral disturbance: Secondary | ICD-10-CM | POA: Diagnosis not present

## 2015-03-17 DIAGNOSIS — I82401 Acute embolism and thrombosis of unspecified deep veins of right lower extremity: Secondary | ICD-10-CM | POA: Diagnosis not present

## 2015-03-17 LAB — GLUCOSE, CAPILLARY: Glucose-Capillary: 93 mg/dL (ref 65–99)

## 2015-03-17 LAB — LIPID PANEL
Cholesterol: 114 mg/dL (ref 0–200)
HDL: 42 mg/dL (ref >40)
LDL CALC: 64 mg/dL (ref 0–99)
TRIGLYCERIDES: 39 mg/dL (ref <150)
Total CHOL/HDL Ratio: 2.7 RATIO
VLDL: 8 mg/dL (ref 0–40)

## 2015-03-17 LAB — BASIC METABOLIC PANEL
ANION GAP: 9 (ref 5–15)
BUN: 8 mg/dL (ref 6–20)
CO2: 25 mmol/L (ref 22–32)
Calcium: 8.2 mg/dL — ABNORMAL LOW (ref 8.9–10.3)
Chloride: 105 mmol/L (ref 101–111)
Creatinine, Ser: 0.68 mg/dL (ref 0.61–1.24)
GFR calc Af Amer: >60 mL/min (ref >60)
GLUCOSE: 103 mg/dL — AB (ref 65–99)
POTASSIUM: 2.6 mmol/L — AB (ref 3.5–5.1)
SODIUM: 139 mmol/L (ref 135–145)

## 2015-03-17 LAB — CBC
HEMATOCRIT: 31.8 % — AB (ref 39.0–52.0)
HEMOGLOBIN: 10.6 g/dL — AB (ref 13.0–17.0)
MCH: 29.2 pg (ref 26.0–34.0)
MCHC: 33.3 g/dL (ref 30.0–36.0)
MCV: 87.6 fL (ref 78.0–100.0)
Platelets: 182 10*3/uL (ref 150–400)
RBC: 3.63 MIL/uL — AB (ref 4.22–5.81)
RDW: 15.4 % (ref 11.5–15.5)
WBC: 10.7 10*3/uL — AB (ref 4.0–10.5)

## 2015-03-17 LAB — HEPARIN LEVEL (UNFRACTIONATED)
HEPARIN UNFRACTIONATED: 0.78 [IU]/mL — AB (ref 0.30–0.70)
Heparin Unfractionated: 1.01 IU/mL — ABNORMAL HIGH (ref 0.30–0.70)

## 2015-03-17 LAB — BRAIN NATRIURETIC PEPTIDE: B Natriuretic Peptide: 22.2 pg/mL (ref 0.0–100.0)

## 2015-03-17 MED ORDER — POTASSIUM CHLORIDE CRYS ER 20 MEQ PO TBCR
40.0000 meq | EXTENDED_RELEASE_TABLET | Freq: Two times a day (BID) | ORAL | Status: AC
  Administered 2015-03-17 (×2): 40 meq via ORAL
  Filled 2015-03-17 (×2): qty 2

## 2015-03-17 MED ORDER — HEPARIN (PORCINE) IN NACL 100-0.45 UNIT/ML-% IJ SOLN
700.0000 [IU]/h | INTRAMUSCULAR | Status: DC
  Administered 2015-03-18: 600 [IU]/h via INTRAVENOUS
  Filled 2015-03-17 (×3): qty 250

## 2015-03-17 NOTE — Progress Notes (Signed)
ANTICOAGULATION CONSULT NOTE - Follow Up  Pharmacy Consult for Heparin Indication: DVT  No Known Allergies  Patient Measurements: Height: 5\' 7"  (170.2 cm) Weight: 126 lb (57.153 kg) IBW/kg (Calculated) : 66.1 Heparin Dosing Weight: actual body weight  Vital Signs: Temp: 97.5 F (36.4 C) (12/06 0538) Temp Source: Axillary (12/06 0538) BP: 124/88 mmHg (12/06 0538) Pulse Rate: 75 (12/06 0538)  Labs:  Recent Labs  03/16/15 2225 03/17/15 0654 03/17/15 0656  HGB 11.7*  --  10.6*  HCT 36.2*  --  31.8*  PLT 206  --  182  APTT 31  --   --   LABPROT 14.6  --   --   INR 1.12  --   --   HEPARINUNFRC  --  1.01*  --   CREATININE 0.76 0.68  --     Estimated Creatinine Clearance: 58.6 mL/min (by C-G formula based on Cr of 0.68).  Assessment: [age] y/oM with PMH of dementia, stroke, L leg DVT 2013, DM, CAD DJD who presented to MD office today with CC of R leg swelling and mild redness. Venous doppler ultrasound shows extensive, occlusive DVT of right lower extremity from the common femoral vein to the posterior tibial vein. [facility] and pharmacy consulted to assist with dosing of heparin infusion for DVT. Patient not on any anticoagulants PTA.  Today, 03/17/2015:  First heparin level supratherapeutic 1.01 on 950 units/hr - verified correct infusion rate at bedside with RN  Confirmed appropriate site of lab draw  No bleeding per discussion with RN  Hgb and plts low but stable from admit  Concurrent aspirin 81mg  daily noted   Goal of Therapy:  Heparin level 0.3-0.7 units/ml Monitor platelets by anticoagulation protocol: Yes   Plan:   Decrease IV heparin infusion to 700 units/hr  Recheck heparin level in 8 hours   Daily heparin level and CBC while on heparin infusion.  Monitor closely for s/s of bleeding.  Follow up plans for transition to oral anticoagulant  Loralee PacasErin Shaquita Fort, PharmD, BCPS Pager: [phone] 03/16/2015 10:26 PM

## 2015-03-17 NOTE — Progress Notes (Signed)
CRITICAL VALUE ALERT  Critical value received:  K 2.6 Date of notification: 03/17/15  Time of notification:  08.7 Critical value read back:yes  Nurse who received alert: Chancy MilroyG Agapita Savarino MD notified (1st page): Dr. Alen BleacherMikail  Time of first page:  0808  MD notified (2nd page):  Time of second page:  Responding MD:  Dr. Catha GosselinMikhail  Time MD responded:  [phone]

## 2015-03-17 NOTE — Progress Notes (Signed)
OT Cancellation Note  Patient Details Name: Raymond SoxWillie P Hugill Jr. MRN: [id number] DOB: 08/19/1931   Cancelled Treatment:     Reason Occupational Therapy Assessment held/not performed: Orders received and chart reviewed. Hold/Cx eval today secondary to pt heparin not at therapeutic level. Will assess as able for occupational therapy.  Charletta CousinBarnhill, Amy Beth Dixon, OTR/L  03/17/2015, 7:57 AM

## 2015-03-17 NOTE — Progress Notes (Signed)
PHARMACY - HEPARIN (brief note)  Patient on IV heparin infusing @ 700 units/hr for DVT  Heparin level = 0.78 (Goal 0.3-0.7)   Heparin level trending down however still supratherapeutic No complications of therapy noted  Plan:  Decrease Heparin to 600 units/hr           Check heparin level 8 hr after rate decreased  Terrilee FilesLeann Manette Doto, PharmD 03/16/16 @ 17:52

## 2015-03-17 NOTE — Progress Notes (Signed)
[facility]                                                                              Patient Demographics  Raymond Rangel, is a 81 y.o. male, DOB - 1932/03/08, WUJ:[id number]  Admit date - 03/16/2015   Admitting Physician [name], MD  Outpatient Primary MD for the patient is [name], MD  LOS - 1   No chief complaint on file.     HPI on 03/16/2015 by Dr. [name]. is a 81 y.o. male with PMH of dementia, hyperlipidemia, diet-controlled diabetes, stroke, left leg DVT 2013, CAD, DJD, diastolic congestive heart failure, hypothyroidism, who presents with right leg swelling and pain. Direct admission from [name]. Patient has dementia, is unable to provide accurate medical history, therefore, most of the history is from his PCP's note and partially from patient himself. Per PCP, Dr. Leta Rangel note, patient has been having right leg swelling and pain in the past 3 days, which has been worsened today. His R leg became mildly red ness since yesterday. No hx of injury, cut or bleeding. [name] ordered LE venous doppler, which showed extensive, occlusive deep venous thrombosis of the right lower extremity from the common femoral vein to the posterior tibial vein. [name] discussed the treatment options with the patient's wife and daughter with oral anticoagulants, but they strongly requested a hospital admission because they can not take care pt at home. The only caregiver is the patient's wife, who has a shoulder injury, monitoring pt him at home is too challenge for them. When I saw pt on the floor. Patient denies chest pain, shortness of breath, abdominal pain, diarrhea, symptoms of UTI. He moves all extremities.  Assessment & Plan  Acute RLE DVT -Lower extremity Doppler showed extensive, occlusive DVT of the right lower extremity from the common femoral vein to the posterior tibial vein -Continue heparin, patient will likely need to be transitioned to oral  anticoagulation -Pending PT evaluation before making decision regarding NOAC -Spoke with IR via phone regarding possible thrombolytic intervention however given patient's age, risk outweighs benefits at this time.  Coronary artery disease -Currently stable, continue statin, aspirin  Hyperlipidemia -Continue statin  Dementia -Continue PPI   Diet controlled Diabetes mellitus, type 2 -Continue CBG monitoring -Last hemoglobin A1c was 6.6 on 05/09/2004  History of CVA -Continue aspirin and statin -Patient is mainly nonverbal  Hypothyroidism -Continue Synthroid  Essential Hypertension -Continue norvasc  Dementia -Continue Aricept  Code Status: Full  Family Communication: None at bedside  Disposition Plan: Admitted.  Pending PT and OT.  Patient will likely need SNF.  Time Spent in minutes   30 minutes  Procedures  LE doppler  Consults   IR via phone  DVT Prophylaxis  Heparin  Lab Results  Component Value Date   PLT 182 03/17/2015    Medications  Scheduled Meds: . amLODipine  5 mg Oral Daily  . aspirin EC  81 mg Oral Daily  . atorvastatin  40 mg Oral Daily  . baclofen  5 mg Oral BID  . cholecalciferol  400 Units Oral Daily  . divalproex  125 mg Oral Q12H  .  donepezil  10 mg Oral QHS  . feeding supplement (GLUCERNA SHAKE)  237 mL Oral TID BM  . levothyroxine  25 mcg Oral QAC breakfast  . potassium chloride  40 mEq Oral BID  . sodium chloride  3 mL Intravenous Q12H   Continuous Infusions: . heparin 700 Units/hr (03/17/15 0850)   PRN Meds:.acetaminophen **OR** acetaminophen, ondansetron **OR** ondansetron (ZOFRAN) IV, oxyCODONE-acetaminophen  Antibiotics    Anti-infectives    None      Subjective:   Mattew Rangel seen and examined today.  Patient mainly nonverbal. Did say hello this morning however could not continue having a conversation.  Objective:   Filed Vitals:   03/16/15 1911 03/16/15 2054 03/16/15 2120 03/17/15 0538  BP: 133/71 136/82  125/91 124/88  Pulse: 95 80 76 75  Temp: 98.1 F (36.7 C) 98.5 F (36.9 C) 97.9 F (36.6 C) 97.5 F (36.4 C)  TempSrc: Axillary Axillary Oral Axillary  Resp: Height:  (1.702 m)     Weight: 57.153 kg (126 lb)     SpO2:  95% 99% 99%    Wt Readings from Last 3 Encounters:  03/16/15 57.153 kg (126 lb)  01/05/15 65.772 kg (145 lb)  05/12/14 54.658 kg (120 lb 8 oz)     Intake/Output Summary (Last 24 hours) at 03/17/15 1154 Last data filed at 03/17/15 6962  Gross per 24 hour  Intake  51.93 ml  Output    175 ml  Net -123.07 ml    Exam  General: Well developed, elderly, thin, NAD  HEENT: NCAT,  mucous membranes moist.   Cardiovascular: S1 S2 auscultated, RRR  Respiratory: Clear to auscultation bilaterally with equal chest rise  Abdomen: Soft, nontender, nondistended, + bowel sounds  Extremities: warm dry without cyanosis clubbing. RLE edema.   Neuro: Unable to fully assess   Data Review   Micro Results No results found for this or any previous visit (from the past 240 hour(s)).  Radiology Reports US Venous Img Lower Unilateral Right  03/16/2015  CLINICAL DATA:  Right lower extremity swelling and erythema EXAM: RIGHT LOWER EXTREMITY VENOUS DOPPLER ULTRASOUND TECHNIQUE: Gray-scale sonography with graded compression, as well as color Doppler and duplex ultrasound were performed to evaluate the lower extremity deep venous systems from the level of the common femoral vein and including the common femoral, femoral, profunda femoral, popliteal and calf veins including the posterior tibial, peroneal and gastrocnemius veins when visible. The superficial great saphenous vein was also interrogated. Spectral Doppler was utilized to evaluate flow at rest and with distal augmentation maneuvers in the common femoral, femoral and popliteal veins. COMPARISON:  None. FINDINGS: Contralateral Common Femoral Vein: Respiratory phasicity is normal and symmetric with the  symptomatic side. No evidence of thrombus. Normal compressibility. Common Femoral Vein: Noncompressible with occlusive thrombus present. Saphenofemoral Junction: Noncompressible with occlusive thrombus present. Profunda Femoral Vein: Noncompressible with occlusive thrombus present. Femoral Vein: Noncompressible with occlusive thrombus present. Popliteal Vein: Noncompressible with occlusive thrombus present. Calf Veins: Posterior tibial vein is noncompressible with occlusive thrombus present. Superficial Great Saphenous Vein: Not visualized. Other Findings:  None. IMPRESSION: Extensive, occlusive deep venous thrombosis of the right lower extremity from the common femoral vein to the posterior tibial vein. Electronically Signed   By: Elige Ko   On: 03/16/2015 16:53    CBC  Recent Labs Lab 03/16/15 2225 03/17/15 0656  WBC 11.6* 10.7*  HGB 11.7* 10.6*  HCT 36.2* 31.8*  PLT 206 182  MCV 88.3 87.6  MCH 28.5 29.2  MCHC 32.3 33.3  RDW 15.3 15.4  LYMPHSABS 2.7  --   MONOABS 0.8  --   EOSABS 0.4  --   BASOSABS 0.1  --     Chemistries   Recent Labs Lab 03/16/15 2225 03/17/15 0654  NA 142 139  K 3.2* 2.6*  CL 107 105  CO2 26 25  GLUCOSE 108* 103*  BUN 9 8  CREATININE 0.76 0.68  CALCIUM 8.9 8.2*  AST 19  --   ALT 23  --   ALKPHOS 119  --   BILITOT 0.5  --    ------------------------------------------------------------------------------------------------------------------ estimated creatinine clearance is 58.6 mL/min (by C-G formula based on Cr of 0.68). ------------------------------------------------------------------------------------------------------------------ No results for input(s): HGBA1C in the last 72 hours. ------------------------------------------------------------------------------------------------------------------  Recent Labs  03/17/15 0654  CHOL 114  HDL 42  LDLCALC 64  TRIG 39  CHOLHDL 2.7    ------------------------------------------------------------------------------------------------------------------ No results for input(s): TSH, T4TOTAL, T3FREE, THYROIDAB in the last 72 hours.  Invalid input(s): FREET3 ------------------------------------------------------------------------------------------------------------------ No results for input(s): VITAMINB12, FOLATE, FERRITIN, TIBC, IRON, RETICCTPCT in the last 72 hours.  Coagulation profile  Recent Labs Lab 03/16/15 2225  INR 1.12    No results for input(s): DDIMER in the last 72 hours.  Cardiac Enzymes No results for input(s): CKMB, TROPONINI, MYOGLOBIN in the last 168 hours.  Invalid input(s): CK ------------------------------------------------------------------------------------------------------------------ Invalid input(s): POCBNP    Theo Krumholz D.O. on 03/17/2015 at 11:54 AM  Between 7am to 7pm - Pager - [phone]  After 7pm go to [email or web address] - password [email or web address]  And look for the night coverage person covering for me after hours  [facility] Office  [phone]

## 2015-03-17 NOTE — Progress Notes (Signed)
PT Cancellation Note  Patient Details Name: Raymond SoxWillie P Houdeshell Jr. MRN: [id number] DOB: 04/08/1932   Cancelled Treatment:    Reason Eval/Treat Not Completed: Medical issues which prohibited therapy (awaiting therapeutic heparin level )   Kairah Leoni,KATHrine E 03/17/2015, 8:22 AM Zenovia JarredKati Marium Ragan, PT, DPT 03/17/2015 Pager: [phone]

## 2015-03-18 DIAGNOSIS — F0390 Unspecified dementia without behavioral disturbance: Secondary | ICD-10-CM | POA: Diagnosis not present

## 2015-03-18 DIAGNOSIS — I2584 Coronary atherosclerosis due to calcified coronary lesion: Secondary | ICD-10-CM

## 2015-03-18 DIAGNOSIS — E785 Hyperlipidemia, unspecified: Secondary | ICD-10-CM

## 2015-03-18 DIAGNOSIS — I82401 Acute embolism and thrombosis of unspecified deep veins of right lower extremity: Secondary | ICD-10-CM | POA: Diagnosis not present

## 2015-03-18 DIAGNOSIS — F0151 Vascular dementia with behavioral disturbance: Secondary | ICD-10-CM

## 2015-03-18 DIAGNOSIS — I2510 Atherosclerotic heart disease of native coronary artery without angina pectoris: Secondary | ICD-10-CM

## 2015-03-18 LAB — CBC
HCT: 35.8 % — ABNORMAL LOW (ref 39.0–52.0)
Hemoglobin: 11.7 g/dL — ABNORMAL LOW (ref 13.0–17.0)
MCH: 29 pg (ref 26.0–34.0)
MCHC: 32.7 g/dL (ref 30.0–36.0)
MCV: 88.6 fL (ref 78.0–100.0)
PLATELETS: 219 10*3/uL (ref 150–400)
RBC: 4.04 MIL/uL — AB (ref 4.22–5.81)
RDW: 15.4 % (ref 11.5–15.5)
WBC: 10 10*3/uL (ref 4.0–10.5)

## 2015-03-18 LAB — HEPARIN LEVEL (UNFRACTIONATED)
HEPARIN UNFRACTIONATED: 0.48 [IU]/mL (ref 0.30–0.70)
HEPARIN UNFRACTIONATED: 0.37 [IU]/mL (ref 0.30–0.70)

## 2015-03-18 LAB — GLUCOSE, CAPILLARY: Glucose-Capillary: 71 mg/dL (ref 65–99)

## 2015-03-18 MED ORDER — WARFARIN VIDEO: Freq: Once | Status: DC

## 2015-03-18 MED ORDER — WARFARIN SODIUM 3 MG PO TABS
3.0000 mg | ORAL_TABLET | Freq: Once | ORAL | Status: AC
  Administered 2015-03-18: 3 mg via ORAL
  Filled 2015-03-18: qty 1

## 2015-03-18 MED ORDER — COUMADIN BOOK
Freq: Once | Status: AC
  Administered 2015-03-18: 18:00:00
  Filled 2015-03-18: qty 1

## 2015-03-18 MED ORDER — WARFARIN - PHARMACIST DOSING INPATIENT
Freq: Every day | Status: DC
  Administered 2015-03-19: 16:00:00

## 2015-03-18 NOTE — Progress Notes (Signed)
ANTICOAGULATION CONSULT NOTE  Pharmacy Consult for Warfarin Indication: DVT  No Known Allergies  Patient Measurements: Height: 5\' 7"  (170.2 cm) Weight: 126 lb (57.153 kg) IBW/kg (Calculated) : 66.1 Heparin Dosing Weight: actual body weight  Vital Signs: Temp: 97.2 F (36.2 C) (12/07 0610) Temp Source: Axillary (12/07 0610) BP: 105/60 mmHg (12/07 0610) Pulse Rate: 67 (12/07 0610)  Labs:  Recent Labs  03/16/15 2225  03/17/15 0654 03/17/15 0656 03/17/15 1724 03/18/15 0208 03/18/15 1000  HGB 11.7*  --   --  10.6*  --  11.7*  --   HCT 36.2*  --   --  31.8*  --  35.8*  --   PLT 206  --   --  182  --  219  --   APTT 31  --   --   --   --   --   --   LABPROT 14.6  --   --   --   --   --   --   INR 1.12  --   --   --   --   --   --   HEPARINUNFRC  --   < > 1.01*  --  0.78* 0.48 0.37  CREATININE 0.76  --  0.68  --   --  0.71  --   < > = values in this interval not displayed.  Estimated Creatinine Clearance: 58.6 mL/min (by C-G formula based on Cr of 0.71).  Assessment: [age] y/oM with PMH of dementia, stroke, L leg DVT 2013, DM, CAD DJD who presented to MD office today with CC of R leg swelling and mild redness. Venous doppler ultrasound shows extensive, occlusive DVT of right lower extremity from the common femoral vein to the posterior tibial vein. [facility] and pharmacy consulted to assist with dosing of heparin infusion for DVT. Patient not on any anticoagulants PTA.  Today, 03/18/2015:   Pharmacy consulted to start Warfarin  Baseline INR 1.12  Cardiac diet ordered: Pt eating 75-100% of meals last 24 hr  Heparin level therapeutic x 2 on 600 units/hr   No bleeding per discussion with RN  Hgb low but stable from admit, plts improved  Concurrent aspirin 81mg  daily noted   Goal of Therapy:  Heparin level 0.3-0.7 units/ml Monitor platelets by anticoagulation protocol: Yes   Plan:   Start warfarin 3mg  po x 1 tonight  Daily PT/INR  Warfarin video  and book  Pharmacist will provide warfarin education prior to discharge  Continue IV heparin infusion at 600 units/hr  Daily heparin level and CBC while on heparin infusion.  Monitor closely for s/s of bleeding.  Haynes Hoehnolleen Zaya Kessenich, PharmD, BCPS 03/18/2015, 3:27 PM  Pager: [phone]

## 2015-03-18 NOTE — Progress Notes (Signed)
Spoke with pt's daughter at bedside concerning Home Health. At present time Pt has [facility] for 10 hrs a day. To increase hours pt will need to go to his PCP. Daughter do not want HHPT at present time. [facility] care [facility]

## 2015-03-18 NOTE — Progress Notes (Signed)
ANTICOAGULATION CONSULT NOTE - Follow Up  Pharmacy Consult for Heparin Indication: DVT  No Known Allergies  Patient Measurements: Height: 5\' 7"  (170.2 cm) Weight: 126 lb (57.153 kg) IBW/kg (Calculated) : 66.1 Heparin Dosing Weight: actual body weight  Vital Signs: Temp: 97.2 F (36.2 C) (12/07 0610) Temp Source: Axillary (12/07 0610) BP: 105/60 mmHg (12/07 0610) Pulse Rate: 67 (12/07 0610)  Labs:  Recent Labs  03/16/15 2225  03/17/15 0654 03/17/15 0656 03/17/15 1724 03/18/15 0208 03/18/15 1000  HGB 11.7*  --   --  10.6*  --  11.7*  --   HCT 36.2*  --   --  31.8*  --  35.8*  --   PLT 206  --   --  182  --  219  --   APTT 31  --   --   --   --   --   --   LABPROT 14.6  --   --   --   --   --   --   INR 1.12  --   --   --   --   --   --   HEPARINUNFRC  --   < > 1.01*  --  0.78* 0.48 0.37  CREATININE 0.76  --  0.68  --   --  0.71  --   < > = values in this interval not displayed.  Estimated Creatinine Clearance: 58.6 mL/min (by C-G formula based on Cr of 0.71).  Assessment: [age] y/oM with PMH of dementia, stroke, L leg DVT 2013, DM, CAD DJD who presented to MD office today with CC of R leg swelling and mild redness. Venous doppler ultrasound shows extensive, occlusive DVT of right lower extremity from the common femoral vein to the posterior tibial vein. [facility] and pharmacy consulted to assist with dosing of heparin infusion for DVT. Patient not on any anticoagulants PTA.  Today, 03/18/2015:  Heparin level therapeutic x 2 on 600 units/hr   No bleeding per discussion with RN  Hgb low but stable from admit, plts improved  Concurrent aspirin 81mg  daily noted   Goal of Therapy:  Heparin level 0.3-0.7 units/ml Monitor platelets by anticoagulation protocol: Yes   Plan:   Continue IV heparin infusion at 600 units/hr  Daily heparin level and CBC while on heparin infusion.  Monitor closely for s/s of bleeding.  Follow up plans for transition to  oral anticoagulant  Loralee PacasErin Mackenzy Eisenberg, PharmD, BCPS Pager: [phone] 03/16/2015 10:26 PM

## 2015-03-18 NOTE — Evaluation (Signed)
Occupational Therapy Evaluation Patient Details Name: Raymond Rangel. MRN: [id number] DOB: Sep 01, 1931 Today's Date: 03/18/2015    History of Present Illness 81 y.o. male with PMH of dementia, hyperlipidemia, diet-controlled diabetes, stroke, left leg DVT 2013, CAD, DJD, diastolic congestive heart failure, hypothyroidism, who presents with right leg swelling and pain and admitted for R LE DVT   Clinical Impression   Pt is currently +2 total assist for all ADL/self care tasks, chart review combined with OT assessment today, indicates that pt is most likely at baseline level. He is not able to follow commands/note h/o dementia. Pt has assist from spouse/family per chart review & discussion w/ PT. Will sign off acute OT at this time.     Follow Up Recommendations  No OT follow up;Supervision/Assistance - 24 hour    Equipment Recommendations  Other (comment) (No family present and pt unable.)    Recommendations for Other Services       Precautions / Restrictions Precautions Precautions: Fall      Mobility Bed Mobility Overal bed mobility: Needs Assistance;+2 for physical assistance Bed Mobility: Rolling;Supine to Sit Rolling: Total assist;+2 for physical assistance   Supine to sit: Total assist;+2 for physical assistance     General bed mobility comments: Pt was +2 total assist for rolling bed and repositioning after having had urinated and BM - pt unaware and unable to assist   Transfers Overall transfer level:  (NT - pt unable to follow commands. Chart review indicates pt is total assist for ADL/self care)                    Balance                                            ADL Overall ADL's : At baseline                                       General ADL Comments: Per assessment today and chart review, pt is total assist for ADL's and self care tasks. No family present during OT assessment however pt had BM & urinated in  bed while OT was in room. Pt kept eyes closed and was not able to follow commands - note dx dementia. Pt was total assist x2 to roll in bed and perform peri care/clean up. Pt does not follow commands. Chart review combined with assessment today, indicates that pt is most likely at baseline. Family/spouse assists PRN at home.     Vision   Unknown baseline, pt kept eyes closed throughout session, only opening for a couple of seconds at a time. Pt unable/no family present    Perception     Praxis      Pertinent Vitals/Pain Pain Assessment: No/denies pain     Hand Dominance Right   Extremity/Trunk Assessment Upper Extremity Assessment Upper Extremity Assessment: Difficult to assess due to impaired cognition (Pt keeps bilateral elbows flexed and w/ increased tone, unable to follow commands. Grossly 75% PROM bilateral shoulders/elbows)   Lower Extremity Assessment Lower Extremity Assessment: Defer to PT evaluation       Communication Communication Communication: No difficulties   Cognition Arousal/Alertness: Lethargic Behavior During Therapy: WFL for tasks assessed/performed Overall Cognitive Status: History of cognitive impairments - at baseline  General Comments: Per chart review/PT states that daughter reports pt is typically more alert and aware, hx of dementia (no family present during OT assessment and pt unable).   General Comments       Exercises       Shoulder Instructions      Home Living Family/patient expects to be discharged to:: Private residence Living Arrangements: Spouse/significant other Available Help at Discharge: Family   Home Access: Level entry     Home Layout: One level     Bathroom Shower/Tub: Walk-in shower;Door   Foot LockerBathroom Toilet: Standard     Home Equipment: Bedside commode;Hospital bed;Wheelchair - [employer] Corporationmanual;Walker - 2 wheels   Additional Comments: Daughter reports dependent care at home, transfers only to w/c.   Spouse primary caregiver, family assists, some have medical training.       Prior Functioning/Environment Level of Independence: Needs assistance  Gait / Transfers Assistance Needed: daughter reports pt requires assist for transfers bed to w/c, he is sometimes able to help with transfers ADL's / Homemaking Assistance Needed: Total Assist   Comments: Dependent care at home with spouse/family    OT Diagnosis: Altered mental status;Cognitive deficits   OT Problem List:     OT Treatment/Interventions:      OT Goals(Current goals can be found in the care plan section) Acute Rehab OT Goals OT Goal Formulation:  (No further OT recommended; PT is likely at baseline level of total assist for all self care tasks)  OT Frequency:     Barriers to D/C:            Co-evaluation              End of Session    Activity Tolerance: Patient tolerated treatment well Patient left: in bed;with call bell/phone within reach;with bed alarm set;with nursing/sitter in room   Time: [id number] OT Time Calculation (min): 19 min Charges:  OT General Charges $OT Visit: 1 Procedure OT Evaluation $Initial OT Evaluation Tier I: 1 Procedure OT Treatments $Self Care/Home Management : 8-22 mins G-Codes: OT G-codes **NOT FOR INPATIENT CLASS** Functional Assessment Tool Used: Clinical judgement Functional Limitation: Self care Self Care Current Status (Q6578(G8987): 100 percent impaired, limited or restricted Self Care Goal Status (I6962(G8988): 100 percent impaired, limited or restricted Self Care Discharge Status (X5284(G8989): 100 percent impaired, limited or restricted  Alm BustardBarnhill, Amy Beth Dixon, OTR/L 03/18/2015, 12:58 PM

## 2015-03-18 NOTE — Progress Notes (Signed)
Xarelto $454.52, Eliquis $454.09 copay. Wife and daughter made aware.  [facility].

## 2015-03-18 NOTE — Progress Notes (Signed)
PHARMACY - HEPARIN (brief note)  Patient on IV heparin infusing @ 600 units/hr for DVT  Heparin level = 0.48 (Goal 0.3-0.7)   Heparin level trending down however still therapeutic No complications of therapy noted  Plan:  Continue Heparin at 600 units/hr           Recheck HL at 10am today to ensure stays in therapeutic range  Lorenza EvangelistGreen, Kenzey Birkland R 03/18/2015 3:22 AM

## 2015-03-18 NOTE — Progress Notes (Signed)
[facility] PROGRESS NOTE  Raymond Rangel Raymond Rangel. ZOX:[id number] DOB: 14-Aug-1931 DOA: 03/16/2015 PCP: [name], MD  HPI/Brief narrative HPI on 03/16/2015 by Dr. [name]. is a 81 y.o. male with PMH of dementia, hyperlipidemia, diet-controlled diabetes, stroke, left leg DVT 2013, CAD, DJD, diastolic congestive heart failure, hypothyroidism, who presents with right leg swelling and pain. Direct admission from [name]. Patient has dementia, is unable to provide accurate medical history, therefore, most of the history is from his PCP's note and partially from patient himself. Per PCP, [name] note, patient has been having right leg swelling and pain in the past 3 days, which has been worsened today. His R leg became mildly red ness since yesterday. No hx of injury, cut or bleeding. [name] ordered LE venous doppler, which showed extensive, occlusive deep venous thrombosis of the right lower extremity from the common femoral vein to the posterior tibial vein. [name] discussed the treatment options with the patient's wife and daughter with oral anticoagulants, but they strongly requested a hospital admission because they can not take care pt at home. The only caregiver is the patient's wife, who has a shoulder injury, monitoring pt him at home is too challenge for them. When I saw pt on the floor. Patient denies chest pain, shortness of breath, abdominal pain, diarrhea, symptoms of UTI. He moves all extremities  Assessment/Plan: Acute RLE DVT -Lower extremity Doppler showed extensive, occlusive DVT of the right lower extremity from the common femoral vein to the posterior tibial vein -Continue heparin, patient will likely need to be transitioned to oral anticoagulation -Dr. Catha Gosselin had spoken with IR via phone regarding possible thrombolytic intervention however given patient's age, risk outweighs benefits at this time. -PT recs for SNF. Family to decide on SNF vs home with home with  home health. -Discussed with CM. Unfortunately cost for NOAC would likely to be too cost-prohibitive for patient. Therefore, will start on coumadin, pharmacy to dose  Coronary artery disease -Currently stable, continue statin, aspirin  Hyperlipidemia -Continue statin  Dementia -Continue PPI  Diet controlled Diabetes mellitus, type 2 -Continue CBG monitoring -Last hemoglobin A1c was 6.6 on 05/09/2004  History of CVA -Continue aspirin and statin -Patient is mainly nonverbal  Hypothyroidism -Continue Synthroid  Essential Hypertension -Continue norvasc for now  Dementia -Continue Aricept as tolerated  Code Status: Full Family Communication: Pt in room, Discussed with daughter, [name], over phone Disposition Plan: Dispo pending SNF vs home w/ home health. SW to discuss tomorrow   Consultants:    Procedures:    Antibiotics: Anti-infectives    None      HPI/Subjective: No complaints this AM  Objective: Filed Vitals:   03/16/15 2120 03/17/15 0538 03/17/15 1422 03/18/15 0610  BP: 125/91 124/88 107/59 105/60  Pulse: 76 75 61 67  Temp: 97.9 F (36.6 C) 97.5 F (36.4 C) 97.7 F (36.5 C) 97.2 F (36.2 C)  TempSrc: Oral Axillary Axillary Axillary  Resp: Height:      Weight:      SpO2: 99% 99% 99% 99%    Intake/Output Summary (Last 24 hours) at 03/18/15 1533 Last data filed at 03/18/15 1230  Gross per 24 hour  Intake 438.11 ml  Output    100 ml  Net 338.11 ml   Filed Weights   03/16/15 1911  Weight: 57.153 kg (126 lb)    Exam:   General:  Awake, in nad  Cardiovascular: regular, s1, s2  Respiratory: normal resp effort, no wheezing  Abdomen: soft, nondistended  Musculoskeletal: perfused, no clubbing   Data Reviewed: Basic Metabolic Panel:  Recent Labs Lab 03/16/15 2225 03/17/15 0654 03/18/15 0208  NA 142 139 138  K 3.2* 2.6* 3.6  CL 107 105 103  CO2 GLUCOSE 108* 103* 95  BUN CREATININE 0.76 0.68  0.71  CALCIUM 8.9 8.2* 8.8*   Liver Function Tests:  Recent Labs Lab 03/16/15 2225  AST 19  ALT 23  ALKPHOS 119  BILITOT 0.5  PROT 7.7  ALBUMIN 3.6   No results for input(s): LIPASE, AMYLASE in the last 168 hours. No results for input(s): AMMONIA in the last 168 hours. CBC:  Recent Labs Lab 03/16/15 2225 03/17/15 0656 03/18/15 0208  WBC 11.6* 10.7* 10.0  NEUTROABS 7.6  --   --   HGB 11.7* 10.6* 11.7*  HCT 36.2* 31.8* 35.8*  MCV 88.3 87.6 88.6  PLT 206 182 219   Cardiac Enzymes: No results for input(s): CKTOTAL, CKMB, CKMBINDEX, TROPONINI in the last 168 hours. BNP (last 3 results)  Recent Labs  03/17/15 0654  BNP 22.2    ProBNP (last 3 results) No results for input(s): PROBNP in the last 8760 hours.  CBG:  Recent Labs Lab 03/17/15 0916 03/18/15 0816  GLUCAP 93 71    No results found for this or any previous visit (from the past 240 hour(s)).   Studies: US Venous Img Lower Unilateral Right  03/16/2015  CLINICAL DATA:  Right lower extremity swelling and erythema EXAM: RIGHT LOWER EXTREMITY VENOUS DOPPLER ULTRASOUND TECHNIQUE: Gray-scale sonography with graded compression, as well as color Doppler and duplex ultrasound were performed to evaluate the lower extremity deep venous systems from the level of the common femoral vein and including the common femoral, femoral, profunda femoral, popliteal and calf veins including the posterior tibial, peroneal and gastrocnemius veins when visible. The superficial great saphenous vein was also interrogated. Spectral Doppler was utilized to evaluate flow at rest and with distal augmentation maneuvers in the common femoral, femoral and popliteal veins. COMPARISON:  None. FINDINGS: Contralateral Common Femoral Vein: Respiratory phasicity is normal and symmetric with the symptomatic side. No evidence of thrombus. Normal compressibility. Common Femoral Vein: Noncompressible with occlusive thrombus present. Saphenofemoral  Junction: Noncompressible with occlusive thrombus present. Profunda Femoral Vein: Noncompressible with occlusive thrombus present. Femoral Vein: Noncompressible with occlusive thrombus present. Popliteal Vein: Noncompressible with occlusive thrombus present. Calf Veins: Posterior tibial vein is noncompressible with occlusive thrombus present. Superficial Great Saphenous Vein: Not visualized. Other Findings:  None. IMPRESSION: Extensive, occlusive deep venous thrombosis of the right lower extremity from the common femoral vein to the posterior tibial vein. Electronically Signed   By: Elige Ko   On: 03/16/2015 16:53    Scheduled Meds: . amLODipine  5 mg Oral Daily  . aspirin EC  81 mg Oral Daily  . atorvastatin  40 mg Oral Daily  . baclofen  5 mg Oral BID  . cholecalciferol  400 Units Oral Daily  . coumadin book   Does not apply Once  . divalproex  125 mg Oral Q12H  . donepezil  10 mg Oral QHS  . feeding supplement (GLUCERNA SHAKE)  237 mL Oral TID BM  . levothyroxine  25 mcg Oral QAC breakfast  . sodium chloride  3 mL Intravenous Q12H  . warfarin  3 mg Oral ONCE-1800  . warfarin   Does not apply Once  . Warfarin - Pharmacist  Dosing Inpatient   Does not apply q1800   Continuous Infusions: . heparin 600 Units/hr (03/18/15 0234)    Principal Problem:   Right leg DVT (HCC) Active Problems:   CAD (coronary artery disease)   Hyperlipidemia   Dementia   Diabetes mellitus, type 2 (HCC)   CVA (cerebral infarction)   Vascular dementia with behavior disturbance   Hypothyroidism  CHIU, STEPHEN K  [facility] Pager [phone]. If 7PM-7AM, please contact night-coverage at [email or web address], password [email or web address] 03/18/2015, 3:33 PM  LOS: 2 days

## 2015-03-18 NOTE — Evaluation (Signed)
Physical Therapy Evaluation Patient Details Name: Raymond Rangel. MRN: [id number] DOB: 1931-12-07 Today's Date: 03/18/2015   History of Present Illness  81 y.o. male with PMH of dementia, hyperlipidemia, diet-controlled diabetes, stroke, left leg DVT 2013, CAD, DJD, diastolic congestive heart failure, hypothyroidism, who presents with right leg swelling and pain and admitted for R LE DVT  Clinical Impression  Pt admitted with above diagnosis. Pt currently with functional limitations due to the deficits listed below (see PT Problem List).  Pt will benefit from skilled PT to increase their independence and safety with mobility to allow discharge to the venue listed below.   Pt's daughter in room at beginning of session and reports pt not at cognitive baseline - usually more alert/awake.  Pt typically requires some assist for transfers.  If family is unable to physically assist pt then pt may need to d/c to SNF.  Pt does have hospital bed and w/c.  May benefit from lift for transfers if d/c home.     Follow Up Recommendations SNF;Supervision/Assistance - 24 hour    Equipment Recommendations  None recommended by PT  (hoyer lift if home)   Recommendations for Other Services       Precautions / Restrictions Precautions Precautions: Fall      Mobility  Bed Mobility Overal bed mobility: Needs Assistance;+2 for physical assistance Bed Mobility: Rolling;Supine to Sit Rolling: Total assist;+2 for physical assistance   Supine to sit: Total assist;+2 for physical assistance     General bed mobility comments: pt able to open eyes and talk a little however not initiating movement with multimodal cues  Transfers Overall transfer level: Needs assistance Equipment used: None Transfers: Squat Pivot Transfers     Squat pivot transfers: +2 physical assistance;Total assist     General transfer comment: pt not attempting to assist, lowered armrest and bed rail for close squat pivot  transfer  Ambulation/Gait                Stairs            Wheelchair Mobility    Modified Rankin (Stroke Patients Only)       Balance Overall balance assessment: Needs assistance Sitting-balance support: Feet supported;Bilateral upper extremity supported Sitting balance-Leahy Scale: Poor Sitting balance - Comments: requires external trunk support                                     Pertinent Vitals/Pain Pain Assessment: No/denies pain    Home Living Family/patient expects to be discharged to:: Private residence Living Arrangements: Spouse/significant other Available Help at Discharge: Family   Home Access: Level entry     Home Layout: One level Home Equipment: Bedside commode;Hospital bed;Wheelchair - [employer] - 2 wheels Additional Comments: Daughter reports dependent care at home, transfers only to w/c.  Spouse primary caregiver, family assists, some have medical training.     Prior Function Level of Independence: Needs assistance   Gait / Transfers Assistance Needed: daughter reports pt requires assist for transfers bed to w/c, he is sometimes able to help with transfers  ADL's / Homemaking Assistance Needed: Total Assist  Comments: Dependent care at home with spouse/family     Hand Dominance   Dominant Hand: Right    Extremity/Trunk Assessment   Upper Extremity Assessment: Difficult to assess due to impaired cognition (Pt keeps bilateral elbows flexed and w/ increased tone, unable to follow commands. Grossly 75%  PROM bilateral shoulders/elbows)           Lower Extremity Assessment: Difficult to assess due to impaired cognition (increased flexion tone and tends to keep LEs in increased adduction at rest, able to perform most ROM passively however did not meet end range)         Communication   Communication: No difficulties  Cognition Arousal/Alertness: Lethargic Behavior During Therapy: WFL for tasks  assessed/performed Overall Cognitive Status: Impaired/Different from baseline                 General Comments: daughter reports pt is typically more alert and aware, hx of dementia    General Comments      Exercises        Assessment/Plan    PT Assessment Patient needs continued PT services  PT Diagnosis Generalized weakness   PT Problem List Decreased activity tolerance;Decreased mobility;Decreased strength;Impaired tone  PT Treatment Interventions Balance training;Functional mobility training;Patient/family education;Therapeutic activities;Wheelchair mobility training;Therapeutic exercise   PT Goals (Current goals can be found in the Care Plan section) Acute Rehab PT Goals PT Goal Formulation: With patient/family Time For Goal Achievement: 04/01/15 Potential to Achieve Goals: Fair    Frequency Min 2X/week   Barriers to discharge        Co-evaluation               End of Session   Activity Tolerance: Patient limited by fatigue;Patient limited by lethargy Patient left: in chair;with call bell/phone within reach;with chair alarm set Nurse Communication: Mobility status;Need for lift equipment    Functional Assessment Tool Used: clinical judgement Functional Limitation: Mobility: Walking and moving around Mobility: Walking and Moving Around Current Status [phone](G8978): 100 percent impaired, limited or restricted Mobility: Walking and Moving Around Goal Status [phone](G8979): At least 60 percent but less than 80 percent impaired, limited or restricted    Time: 1150-1210 PT Time Calculation (min) (ACUTE ONLY): 20 min   Charges:   PT Evaluation $Initial PT Evaluation Tier I: 1 Procedure     PT G Codes:   PT G-Codes **NOT FOR INPATIENT CLASS** Functional Assessment Tool Used: clinical judgement Functional Limitation: Mobility: Walking and moving around Mobility: Walking and Moving Around Current Status ([id number](G8978): 100 percent impaired, limited or  restricted Mobility: Walking and Moving Around Goal Status ([id number](G8979): At least 60 percent but less than 80 percent impaired, limited or restricted    Kamali Nephew,KATHrine E 03/18/2015, 1:43 PM Zenovia JarredKati Lorna Strother, PT, DPT 03/18/2015 Pager: [phone]

## 2015-03-19 DIAGNOSIS — F0390 Unspecified dementia without behavioral disturbance: Secondary | ICD-10-CM | POA: Diagnosis not present

## 2015-03-19 DIAGNOSIS — I82401 Acute embolism and thrombosis of unspecified deep veins of right lower extremity: Secondary | ICD-10-CM | POA: Diagnosis not present

## 2015-03-19 DIAGNOSIS — F0151 Vascular dementia with behavioral disturbance: Secondary | ICD-10-CM | POA: Diagnosis not present

## 2015-03-19 DIAGNOSIS — E785 Hyperlipidemia, unspecified: Secondary | ICD-10-CM | POA: Diagnosis not present

## 2015-03-19 LAB — PROTIME-INR
INR: 1.13 (ref 0.00–1.49)
Prothrombin Time: 14.7 seconds (ref 11.6–15.2)

## 2015-03-19 LAB — BASIC METABOLIC PANEL
ANION GAP: 8 (ref 5–15)
BUN: 8 mg/dL (ref 6–20)
CO2: 27 mmol/L (ref 22–32)
Calcium: 8.8 mg/dL — ABNORMAL LOW (ref 8.9–10.3)
Chloride: 103 mmol/L (ref 101–111)
Creatinine, Ser: 0.71 mg/dL (ref 0.61–1.24)
GFR calc Af Amer: >60 mL/min (ref >60)
Glucose, Bld: 95 mg/dL (ref 65–99)
POTASSIUM: 3.6 mmol/L (ref 3.5–5.1)
SODIUM: 138 mmol/L (ref 135–145)

## 2015-03-19 LAB — CBC
HCT: 34.2 % — ABNORMAL LOW (ref 39.0–52.0)
HEMOGLOBIN: 11.2 g/dL — AB (ref 13.0–17.0)
MCH: 28.3 pg (ref 26.0–34.0)
MCHC: 32.7 g/dL (ref 30.0–36.0)
MCV: 86.4 fL (ref 78.0–100.0)
Platelets: 229 10*3/uL (ref 150–400)
RBC: 3.96 MIL/uL — AB (ref 4.22–5.81)
RDW: 15 % (ref 11.5–15.5)
WBC: 10.7 10*3/uL — AB (ref 4.0–10.5)

## 2015-03-19 LAB — GLUCOSE, CAPILLARY: GLUCOSE-CAPILLARY: 107 mg/dL — AB (ref 65–99)

## 2015-03-19 LAB — HEPARIN LEVEL (UNFRACTIONATED): HEPARIN UNFRACTIONATED: 0.29 [IU]/mL — AB (ref 0.30–0.70)

## 2015-03-19 MED ORDER — WARFARIN SODIUM 5 MG PO TABS: 5.0000 mg | ORAL_TABLET | Freq: Once | ORAL | Status: DC

## 2015-03-19 MED ORDER — ENOXAPARIN SODIUM 150 MG/ML ~~LOC~~ SOLN: 1.5000 mg/kg | SUBCUTANEOUS | Status: DC

## 2015-03-19 MED ORDER — OXYCODONE-ACETAMINOPHEN 5-325 MG PO TABS: 1.0000 | ORAL_TABLET | Freq: Four times a day (QID) | ORAL | Status: DC | PRN

## 2015-03-19 MED ORDER — ENOXAPARIN SODIUM 100 MG/ML ~~LOC~~ SOLN
1.5000 mg/kg | SUBCUTANEOUS | Status: DC
  Administered 2015-03-19: 85 mg via SUBCUTANEOUS
  Filled 2015-03-19: qty 1

## 2015-03-19 MED ORDER — WARFARIN SODIUM 5 MG PO TABS
5.0000 mg | ORAL_TABLET | Freq: Once | ORAL | Status: AC
Start: 2015-03-19 — End: 2015-03-19
  Administered 2015-03-19: 5 mg via ORAL
  Filled 2015-03-19: qty 1

## 2015-03-19 NOTE — Plan of Care (Signed)
Problem: Education: Goal: Knowledge of [facility] Education information/materials will improve Outcome: Not Applicable Date Met:  74/60/02 Patient was demented-education was difficult

## 2015-03-19 NOTE — Clinical Social Work Note (Signed)
Clinical Social Work Assessment  Patient Details  Name: Raymond SoxWillie P Loveall Jr. MRN: [id number] Date of Birth: 10/08/1931  Date of referral:  03/19/15               Reason for consult:  Facility Placement                Permission sought to share information with:  Family Supports Permission granted to share information::  No  Name::     wife and daughter  Agency::  SNF's  Relationship::     Contact Information:     Housing/Transportation Living arrangements for the past 2 months:  Single Family Home Source of Information:  Adult Children Patient Interpreter Needed:  None Criminal Activity/Legal Involvement Pertinent to Current Situation/Hospitalization:  No - Comment as needed Significant Relationships:  Adult Children, Spouse Lives with:  Spouse Do you feel safe going back to the place where you live?  No Need for family participation in patient care:  Yes (Comment)  Care giving concerns:  CSW advised by [facility] Catar[facility] And Laser Institute Inc PcRNCM family agreeable to SNF   Emotional Assessment Appearance:  Developmentally appropriate Attitude/Demeanor/Rapport:    Affect (typically observed):  Unable to Assess Orientation:  Oriented to Self Alcohol / Substance use:    Psych involvement (Current and /or in the community):  No (Comment)  Discharge Needs  Concerns to be addressed:  No discharge needs identified Readmission within the last 30 days:  No Current discharge risk:  None Barriers to Discharge:  No Barriers Identified   Liliana ClineCaldwell, Jaella Weinert Parks, LCSW 03/19/2015, 3:16 PM

## 2015-03-19 NOTE — Progress Notes (Signed)
ANTICOAGULATION CONSULT NOTE - Follow Up Consult  Pharmacy Consult for Heparin Indication: DVT  No Known Allergies  Patient Measurements: Height: 5\' 7"  (170.2 cm) Weight: 126 lb (57.153 kg) IBW/kg (Calculated) : 66.1 Heparin Dosing Weight:   Vital Signs: Temp: 97.6 F (36.4 C) (12/08 0601) Temp Source: Oral (12/08 0601) BP: 123/76 mmHg (12/08 0601) Pulse Rate: 69 (12/08 0601)  Labs:  Recent Labs  03/16/15 2225 03/17/15 0654 03/17/15 0656  03/18/15 0208 03/18/15 1000 03/19/15 0500  HGB 11.7*  --  10.6*  --  11.7*  --  11.2*  HCT 36.2*  --  31.8*  --  35.8*  --  34.2*  PLT 206  --  182  --  219  --  229  APTT 31  --   --   --   --   --   --   LABPROT 14.6  --   --   --   --   --  14.7  INR 1.12  --   --   --   --   --  1.13  HEPARINUNFRC  --  1.01*  --   < > 0.48 0.37 0.29*  CREATININE 0.76 0.68  --   --  0.71  --   --   < > = values in this interval not displayed.  Estimated Creatinine Clearance: 58.6 mL/min (by C-G formula based on Cr of 0.71).   Medications:  Infusions:  . heparin 600 Units/hr (03/18/15 0234)    Assessment: Patient with low heparin level.  No heparin issues noted.  Goal of Therapy:  Heparin level 0.3-0.7 units/ml Monitor platelets by anticoagulation protocol: Yes   Plan:  Increase heparin to 700 units/hr Check heparin level at [address]  Suhana Wilner Jr, [place] Crow[place] 03/19/2015,7:02 AM

## 2015-03-19 NOTE — Progress Notes (Signed)
[facility] Family agreeable to this plan- plan transfer via EMS. Reece LevyJanet Debi Cousin, MSW, Theresia MajorsLCSWA [phone]

## 2015-03-19 NOTE — Progress Notes (Signed)
Observation sheet given on 03/18/15

## 2015-03-19 NOTE — Discharge Instructions (Signed)

## 2015-03-19 NOTE — Discharge Summary (Signed)
Physician Discharge Summary  [facility] RUE:[id number] DOB: 08/31/1931 DOA: 03/16/2015  PCP: Willow OraJose Paz, MD  Admit date: 03/16/2015 Discharge date: 03/19/2015  Time spent: 20 minutes  Recommendations for Outpatient Follow-up:  1. Follow up with PCP in 1-2 weeks 2. Please re-check INR within 3-5 days of discharge and adjust coumadin accordingly to maintain INR 2-3 3. If/when patient's INR is therapeutic, would then stop lovenox   Discharge Diagnoses:  Principal Problem:   Right leg DVT (HCC) Active Problems:   CAD (coronary artery disease)   Hyperlipidemia   Dementia   Diabetes mellitus, type 2 (HCC)   CVA (cerebral infarction)   Vascular dementia with behavior disturbance   Hypothyroidism   Discharge Condition: Stable  Diet recommendation: Heart healthy  Filed Weights   03/16/15 1911  Weight: 57.153 kg (126 lb)    History of present illness:  Please review dictated H and P from 12/5 for details. Briefly, 81 y.o. male with PMH of dementia, hyperlipidemia, diet-controlled diabetes, stroke, left leg DVT 2013, CAD, DJD, diastolic congestive heart failure, hypothyroidism, who presents with right leg swelling and pain. Patient was found an acute RLE DVT.  Hospital Course:  Acute RLE DVT -Lower extremity Doppler showed extensive, occlusive DVT of the right lower extremity from the common femoral vein to the posterior tibial vein -Initially continued heparin gtt -Dr. Catha GosselinMikhail had spoken with IR via phone regarding possible thrombolytic intervention however given patient's age, risk outweighs benefits at this time. -PT recs for SNF.. -Discussed with CM. Unfortunately the price of NOAC would likely to be too cost-prohibitive for patient. Therefore, will start on coumadin, pharmacy to dose -Recommend bridging lovenox until coumadin therapeutic at INR 2-3. Recommend facility to recheck INR within 3-5 days of discharge  Coronary artery disease -Currently stable, continue statin,  aspirin  Hyperlipidemia -Continue statin  Dementia -Continue PPI  Diet controlled Diabetes mellitus, type 2 -Continue CBG monitoring -Last hemoglobin A1c was 6.6 on 05/09/2004  History of CVA -Continued aspirin and statin -Patient is mainly nonverbal  Hypothyroidism -Continue Synthroid  Essential Hypertension -Continue norvasc for now  Dementia -Continue Aricept as tolerated  Discharge Exam: Filed Vitals:   03/18/15 2107 03/19/15 0601 03/19/15 0931 03/19/15 1350  BP: 123/68 123/76 115/65 130/61  Pulse: 82 69  62  Temp: 98.3 F (36.8 C) 97.6 F (36.4 C)  97.5 F (36.4 C)  TempSrc: Oral Oral  Oral  Resp: 18 18  16   Height:      Weight:      SpO2: 100%   100%    General: Awake, in nad Cardiovascular: regular, s1, s2 Respiratory: normal resp effort, no wheezing  Discharge Instructions     Medication List    STOP taking these medications        cephALEXin 250 MG capsule  Commonly known as:  KEFLEX     FLUVIRIN 0.5 ML Susy  Generic drug:  Influenza Vac Typ A&B Surf Ant      TAKE these medications        amLODipine 5 MG tablet  Commonly known as:  NORVASC  Take 1 tablet (5 mg total) by mouth daily.     aspirin EC 81 MG tablet  Take 81 mg by mouth daily.     atorvastatin 40 MG tablet  Commonly known as:  LIPITOR  Take 1 tablet (40 mg total) by mouth daily.     baclofen 10 MG tablet  Commonly known as:  LIORESAL  Take 0.5 tablets (5 mg  total) by mouth 2 (two) times daily.     divalproex 125 MG capsule  Commonly known as:  DEPAKOTE SPRINKLE  TAKE 1 CAPSULE BY MOUTH EVERY MORNING, MAY TAKE 1CAPSULE IN THE EVENING AS NEEDED FOR AGITATION.     donepezil 5 MG tablet  Commonly known as:  ARICEPT  Take 2 tablets (10 mg total) by mouth at bedtime.     enoxaparin 150 MG/ML injection  Commonly known as:  LOVENOX  Inject 0.57 mLs (85 mg total) into the skin daily.     feeding supplement (GLUCERNA SHAKE) Liqd  Take 237 mLs by mouth 3 (three) times  daily between meals.     levothyroxine 25 MCG tablet  Commonly known as:  SYNTHROID, LEVOTHROID  Take 1 tablet (25 mcg total) by mouth daily before breakfast.     oxyCODONE-acetaminophen 5-325 MG tablet  Commonly known as:  PERCOCET/ROXICET  Take 1 tablet by mouth every 6 (six) hours as needed for moderate pain.     VITAMIN D PO  Take 1 tablet by mouth daily with lunch.     warfarin 5 MG tablet  Commonly known as:  COUMADIN  Take 1 tablet (5 mg total) by mouth one time only at 6 PM.       No Known Allergies Follow-up Information    [facility]   Specialty:  Skilled Nursing Facility   Why:  Hospital follow up   Contact information:   [address] [place] [address] [phone]       The results of significant diagnostics from this hospitalization (including imaging, microbiology, ancillary and laboratory) are listed below for reference.    Significant Diagnostic Studies: US Venous Img Lower Unilateral Right  03/16/2015  CLINICAL DATA:  Right lower extremity swelling and erythema EXAM: RIGHT LOWER EXTREMITY VENOUS DOPPLER ULTRASOUND TECHNIQUE: Gray-scale sonography with graded compression, as well as color Doppler and duplex ultrasound were performed to evaluate the lower extremity deep venous systems from the level of the common femoral vein and including the common femoral, femoral, profunda femoral, popliteal and calf veins including the posterior tibial, peroneal and gastrocnemius veins when visible. The superficial great saphenous vein was also interrogated. Spectral Doppler was utilized to evaluate flow at rest and with distal augmentation maneuvers in the common femoral, femoral and popliteal veins. COMPARISON:  None. FINDINGS: Contralateral Common Femoral Vein: Respiratory phasicity is normal and symmetric with the symptomatic side. No evidence of thrombus. Normal compressibility. Common Femoral Vein: Noncompressible with  occlusive thrombus present. Saphenofemoral Junction: Noncompressible with occlusive thrombus present. Profunda Femoral Vein: Noncompressible with occlusive thrombus present. Femoral Vein: Noncompressible with occlusive thrombus present. Popliteal Vein: Noncompressible with occlusive thrombus present. Calf Veins: Posterior tibial vein is noncompressible with occlusive thrombus present. Superficial Great Saphenous Vein: Not visualized. Other Findings:  None. IMPRESSION: Extensive, occlusive deep venous thrombosis of the right lower extremity from the common femoral vein to the posterior tibial vein. Electronically Signed   By: Elige Ko   On: 03/16/2015 16:53    Microbiology: No results found for this or any previous visit (from the past 240 hour(s)).   Labs: Basic Metabolic Panel:  Recent Labs Lab 03/16/15 2225 03/17/15 0654 03/18/15 0208  NA 142 139 138  K 3.2* 2.6* 3.6  CL 107 105 103  CO2 GLUCOSE 108* 103* 95  BUN CREATININE 0.76 0.68 0.71  CALCIUM 8.9 8.2* 8.8*   Liver Function Tests:  Recent  Labs Lab 03/16/15 2225  AST 19  ALT 23  ALKPHOS 119  BILITOT 0.5  PROT 7.7  ALBUMIN 3.6   No results for input(s): LIPASE, AMYLASE in the last 168 hours. No results for input(s): AMMONIA in the last 168 hours. CBC:  Recent Labs Lab 03/16/15 2225 03/17/15 0656 03/18/15 0208 03/19/15 0500  WBC 11.6* 10.7* 10.0 10.7*  NEUTROABS 7.6  --   --   --   HGB 11.7* 10.6* 11.7* 11.2*  HCT 36.2* 31.8* 35.8* 34.2*  MCV 88.3 87.6 88.6 86.4  PLT 206 182 219 229   Cardiac Enzymes: No results for input(s): CKTOTAL, CKMB, CKMBINDEX, TROPONINI in the last 168 hours. BNP: BNP (last 3 results)  Recent Labs  03/17/15 0654  BNP 22.2    ProBNP (last 3 results) No results for input(s): PROBNP in the last 8760 hours.  CBG:  Recent Labs Lab 03/17/15 0916 03/18/15 0816 03/19/15 0746  GLUCAP 93 71 107*     Signed:  Samaia Iwata K  [facility] 03/19/2015, 2:23 PM

## 2015-03-19 NOTE — Progress Notes (Signed)
Patient was [facility] by [facility] to [facility].

## 2015-03-19 NOTE — Clinical Social Work Placement (Signed)
   CLINICAL SOCIAL WORK PLACEMENT  NOTE  Date:  03/19/2015  Patient Details  Name: Raymond Sox[facility]ie P Binstock Jr. MRN: [id number] Date of Birth: 09/10/1931  Clinical Social Work is seeking post-discharge placement for this patient at the Skilled  Nursing Facility level of care (*CSW will initial, date and re-position this form in  chart as items are completed):  No   Patient/family provided with [place] Clinical Social Work Department's list of facilities offering this level of care within the geographic area requested by the patient (or if unable, by the patient's family).  Yes   Patient/family informed of their freedom to choose among providers that offer the needed level of care, that participate in Medicare, Medicaid or managed care program needed by the patient, have an available bed and are willing to accept the patient.  No   Patient/family informed of [facility] [facility]'s ownership interest in [facility] and [facility], as well as of the fact that they are under no obligation to receive care at these facilities.  PASRR submitted to EDS on       PASRR number received on       Existing PASRR number confirmed on 03/19/15     FL2 transmitted to all facilities in geographic area requested by pt/family on 03/19/15     FL2 transmitted to all facilities within larger geographic area on       Patient informed that his/her managed care company has contracts with or will negotiate with certain facilities, including the following:        Yes   Patient/family informed of bed offers received.  Patient chooses bed at  [facility]     Physician recommends and patient chooses bed at      Patient to be [facility] on 03/19/15.  Patient to be transferred to facility by  [name]([facility])     Patient family notified on 03/19/15 of transfer.  Name of family member notified:  daughterNoreene Filbert- [place]ak  and wife     PHYSICIAN       Additional Comment:     _______________________________________________ Liliana Clinealdwell, Aison Malveaux Parks, LCSW 03/19/2015, 3:21 PM

## 2015-03-19 NOTE — Progress Notes (Signed)
ANTICOAGULATION CONSULT NOTE - Initial Consult  Pharmacy Consult for warfarin and lovenox bridge Indication: DVT No Known Allergies  Patient Measurements: Height: 5\' 7"  (170.2 cm) Weight: 126 lb (57.153 kg) IBW/kg (Calculated) : 66.1   Vital Signs: Temp: 97.6 F (36.4 C) (12/08 0601) Temp Source: Oral (12/08 0601) BP: 115/65 mmHg (12/08 0931) Pulse Rate: 69 (12/08 0601)  Labs:  Recent Labs  03/16/15 2225 03/17/15 0654 03/17/15 0656  03/18/15 0208 03/18/15 1000 03/19/15 0500  HGB 11.7*  --  10.6*  --  11.7*  --  11.2*  HCT 36.2*  --  31.8*  --  35.8*  --  34.2*  PLT 206  --  182  --  219  --  229  APTT 31  --   --   --   --   --   --   LABPROT 14.6  --   --   --   --   --  14.7  INR 1.12  --   --   --   --   --  1.13  HEPARINUNFRC  --  1.01*  --   < > 0.48 0.37 0.29*  CREATININE 0.76 0.68  --   --  0.71  --   --   < > = values in this interval not displayed.  Estimated Creatinine Clearance: 58.6 mL/min (by C-G formula based on Cr of 0.71).   Medical History: Past Medical History  Diagnosis Date  . Stroke (HCC) 04-2011  . Irregular heart beat   . Dementia   . DVT (deep venous thrombosis) (HCC)     L leg 04-2011  . Hypercholesteremia   . Arthritis   . Diabetes mellitus   . Infectious colitis 08/07/2012  . CAD (coronary artery disease)     stent 2005  . DJD (degenerative joint disease)   . H/O ETOH abuse   . Recurrent UTI     UTI, MS changes admited 02-2013     Assessment: [age] y/oM with PMH of dementia, stroke, L leg DVT 2013, DM, CAD DJD who presented to MD office today with CC of R leg swelling and mild redness. Venous doppler ultrasound shows extensive, occlusive DVT of right lower extremity from the common femoral vein to the posterior tibial vein. [facility], and pharmacy consulted to assist with dosing of heparin infusion for DVT. Patient not on any anticoagulants PTA  03/19/2015 INR 1.13 -subtherapeutic Heparin drip @ 700 Units/hr Cardiac  diet ordered No bleeding per RN Hgb low but stable, plts improved Concurrent aspirin 81mg  daily noted  Goal of Therapy:  INR 2-3   Plan:   Stop heparin drip and start enoxparin 1.5mg /kg/dose (85mg ) once daily for bridge with warfarin, per guidelines will need 5 days of overlap and INR therapeutic x 2 consecutive days  warfarin 5mg  today @ 1800  Daily PT/INR  Warfarin education given to daughter and wife over phone on 12/8)  Arley Phenixllen Zebediah Beezley RPh 03/19/2015, 12:59 PM Pager [phone]

## 2015-03-19 NOTE — NC FL2 (Signed)
MEDICAID FL2 LEVEL OF CARE SCREENING TOOL     IDENTIFICATION  Patient Name: Raymond SoxWillie P Font Jr. Birthdate: 05/28/1931 Sex: male Admission Date (Current Location): 03/16/2015  [facility] [place]   Facility and Address:  [facility],  [address], [place] [id number]      Provider Number: [id number]  Attending Physician Name and Address:  Jerald KiefStephen K Chiu, MD  Relative Name and Phone Number:       Current Level of Care: Hospital Recommended Level of Care: Skilled Nursing Facility Prior Approval Number:    Date Approved/Denied:   PASRR Number:    Discharge Plan: SNF    Current Diagnoses: Patient Active Problem List   Diagnosis Date Noted  . PCP NOTES >>>>>> 03/16/2015  . Right leg DVT (HCC) 03/16/2015  . Hypothyroidism 06/02/2014  . Weakness 05/09/2014  . Altered mental status 05/08/2014  . Vitamin B 12 deficiency 03/29/2013  . Anemia 02/14/2013  . Physical deconditioning 12/11/2012  . Protein-calorie malnutrition, severe (HCC) 11/26/2012  . Acute encephalopathy 11/24/2012  . Recurrent urinary tract infection 11/24/2012  . Unspecified protein-calorie malnutrition (HCC) 11/24/2012  . Debility 09/07/2012  . Spasticity 09/07/2012  . Metabolic acidosis with normal anion gap and bicarbonate losses, also + Anion gap  08/07/2012  . Ataxia, late effect of cerebrovascular disease 07/06/2012  . Vascular dementia with behavior disturbance 07/06/2012  . Homonymous hemianopsia due to recent stroke 05/31/2012  . CVA (cerebral infarction) 05/26/2012  . Diabetes mellitus, type 2 (HCC) 07/29/2011  . Right shoulder pain 05/02/2011  . CAD (coronary artery disease) 05/02/2011  . Hyperlipidemia 05/02/2011  . Dementia 05/02/2011    Orientation ACTIVITIES/SOCIAL BLADDER RESPIRATION    Self  Active Continent Normal  BEHAVIORAL SYMPTOMS/MOOD NEUROLOGICAL BOWEL NUTRITION STATUS      Continent Diet  PHYSICIAN VISITS COMMUNICATION OF NEEDS  Height & Weight Skin    Verbally 5\' 7"  (170.2 cm) 126 lbs. Normal          AMBULATORY STATUS RESPIRATION    Assist extensive Normal      Personal Care Assistance Level of Assistance               Functional Limitations Info                SPECIAL CARE FACTORS FREQUENCY  PT (By licensed PT), OT (By licensed OT)     PT Frequency: 5 OT Frequency: 5           Additional Factors Info  Code Status, Allergies Code Status Info: Full Code Allergies Info: NKDA           Current Medications (03/19/2015):  This is the current hospital active medication list Current Facility-Administered Medications  Medication Dose Route Frequency Provider Last Rate Last Dose  . acetaminophen (TYLENOL) tablet 650 mg  650 mg Oral Q6H PRN Lorretta HarpXilin Niu, MD       Or  . acetaminophen (TYLENOL) suppository 650 mg  650 mg Rectal Q6H PRN Lorretta HarpXilin Niu, MD      . amLODipine (NORVASC) tablet 5 mg  5 mg Oral Daily Lorretta HarpXilin Niu, MD   5 mg at 03/19/15 0931  . aspirin EC tablet 81 mg  81 mg Oral Daily Lorretta HarpXilin Niu, MD   81 mg at 03/19/15 0931  . atorvastatin (LIPITOR) tablet 40 mg  40 mg Oral Daily Lorretta HarpXilin Niu, MD   40 mg at 03/19/15 0931  . baclofen (LIORESAL) tablet 5 mg  5 mg Oral BID Lorretta HarpXilin Niu,  MD   5 mg at 03/19/15 0931  . cholecalciferol (VITAMIN D) tablet 400 Units  400 Units Oral Daily [name], MD   400 Units at 03/19/15 [phone]  . divalproex (DEPAKOTE SPRINKLE) capsule 125 mg  125 mg Oral Q12H [name], MD   125 mg at 03/19/15 0931  . donepezil (ARICEPT) tablet 10 mg  10 mg Oral QHS [name], MD   10 mg at 03/18/15 2142  . feeding supplement (GLUCERNA SHAKE) (GLUCERNA SHAKE) liquid 237 mL  237 mL Oral TID BM [name], MD   237 mL at 03/19/15 0931  . heparin ADULT infusion 100 units/mL (25000 units/250 mL)  700 Units/hr Intravenous Continuous Jerald Kief, MD 7 mL/hr at 03/19/15 0803 700 Units/hr at 03/19/15 0803  . levothyroxine (SYNTHROID, LEVOTHROID) tablet 25 mcg  25 mcg Oral QAC breakfast [name],  MD   25 mcg at 03/19/15 0801  . ondansetron (ZOFRAN) tablet 4 mg  4 mg Oral Q6H PRN [name], MD       Or  . ondansetron [facility]) injection 4 mg  4 mg Intravenous Q6H PRN [name], MD      . oxyCODONE-acetaminophen (PERCOCET/ROXICET) 5-325 MG per tablet 1 tablet  1 tablet Oral Q6H PRN [name], MD      . sodium chloride 0.9 % injection 3 mL  3 mL Intravenous Q12H [name], MD   3 mL at 03/19/15 0000  . warfarin (COUMADIN) video   Does not apply Once Jerald Kief, MD      . Warfarin - Pharmacist Dosing Inpatient   Does not apply [id number] Jerald Kief, MD         Discharge Medications: Please see discharge summary for a list of discharge medications.  Relevant Imaging Results:  Relevant Lab Results:  Recent Labs    Additional Information SSN [id number]  Liliana Cline, LCSW

## 2015-03-19 NOTE — Progress Notes (Signed)
Report called to SNF spoke to [place] Jerrold Haskell LPN

## 2015-03-19 NOTE — Plan of Care (Signed)
Problem: Tissue Perfusion: Goal: Risk factors for ineffective tissue perfusion will decrease Outcome: Completed/Met Date Met:  03/19/15 Heparin Infusion completed

## 2015-03-20 ENCOUNTER — Telehealth: Payer: Self-pay | Admitting: Internal Medicine

## 2015-03-20 NOTE — Telephone Encounter (Signed)
Please check on the patient on Monday: Was discharged from hospital on Coumadin, I'm not sure if he is at home or a SNF. One way or the other needs a Coumadin check until therapeutic. Please verify an INR is done. Schedule a hospital follow-u at his convenience

## 2015-03-23 NOTE — Telephone Encounter (Signed)
Called [facility] at [phone] to follow up with patient regarding INR checks.  Spoke to receptionist who says that no one was available at the moment, but took message and says that someone would be calling me back shortly.  Awaiting call back.

## 2015-03-25 NOTE — Telephone Encounter (Signed)
Called [facility] at [phone] to follow up.  Spoke to [place], RN who says that INRs are being checked.  Last INR 03/23/15--PT 17.2 and INR 1.4.  Next check: 03/30/15.  Coumadin [facility].    Per Wilkie AyeKristy, Discharge planner, pt is scheduled to be discharged at the end of next week.  Will schedule hospital follow up appt at that time.

## 2015-03-31 ENCOUNTER — Emergency Department (HOSPITAL_COMMUNITY): Payer: Medicare Other

## 2015-03-31 ENCOUNTER — Emergency Department (HOSPITAL_COMMUNITY)
Admission: EM | Admit: 2015-03-31 | Discharge: 2015-04-01 | Disposition: A | Discharge status: Home / Self Care | Payer: Medicare Other | Attending: Emergency Medicine | Admitting: Emergency Medicine

## 2015-03-31 ENCOUNTER — Encounter (HOSPITAL_COMMUNITY): Payer: Self-pay | Admitting: Emergency Medicine

## 2015-03-31 DIAGNOSIS — Z7901 Long term (current) use of anticoagulants: Secondary | ICD-10-CM | POA: Insufficient documentation

## 2015-03-31 DIAGNOSIS — Z86718 Personal history of other venous thrombosis and embolism: Secondary | ICD-10-CM | POA: Insufficient documentation

## 2015-03-31 DIAGNOSIS — Y9389 Activity, other specified: Secondary | ICD-10-CM | POA: Diagnosis not present

## 2015-03-31 DIAGNOSIS — Y998 Other external cause status: Secondary | ICD-10-CM | POA: Diagnosis not present

## 2015-03-31 DIAGNOSIS — E119 Type 2 diabetes mellitus without complications: Secondary | ICD-10-CM | POA: Insufficient documentation

## 2015-03-31 DIAGNOSIS — I2510 Atherosclerotic heart disease of native coronary artery without angina pectoris: Secondary | ICD-10-CM | POA: Diagnosis not present

## 2015-03-31 DIAGNOSIS — Z8673 Personal history of transient ischemic attack (TIA), and cerebral infarction without residual deficits: Secondary | ICD-10-CM | POA: Diagnosis not present

## 2015-03-31 DIAGNOSIS — W19XXXA Unspecified fall, initial encounter: Secondary | ICD-10-CM

## 2015-03-31 DIAGNOSIS — Z7982 Long term (current) use of aspirin: Secondary | ICD-10-CM | POA: Insufficient documentation

## 2015-03-31 DIAGNOSIS — Z87891 Personal history of nicotine dependence: Secondary | ICD-10-CM | POA: Diagnosis not present

## 2015-03-31 DIAGNOSIS — S0181XA Laceration without foreign body of other part of head, initial encounter: Secondary | ICD-10-CM | POA: Diagnosis present

## 2015-03-31 DIAGNOSIS — Z8619 Personal history of other infectious and parasitic diseases: Secondary | ICD-10-CM | POA: Insufficient documentation

## 2015-03-31 DIAGNOSIS — Y92128 Other place in nursing home as the place of occurrence of the external cause: Secondary | ICD-10-CM | POA: Insufficient documentation

## 2015-03-31 DIAGNOSIS — Z87440 Personal history of urinary (tract) infections: Secondary | ICD-10-CM | POA: Diagnosis not present

## 2015-03-31 DIAGNOSIS — Z79899 Other long term (current) drug therapy: Secondary | ICD-10-CM | POA: Insufficient documentation

## 2015-03-31 DIAGNOSIS — T148XXA Other injury of unspecified body region, initial encounter: Secondary | ICD-10-CM

## 2015-03-31 DIAGNOSIS — M1990 Unspecified osteoarthritis, unspecified site: Secondary | ICD-10-CM | POA: Diagnosis not present

## 2015-03-31 DIAGNOSIS — E7800 Pure hypercholesterolemia, unspecified: Secondary | ICD-10-CM | POA: Insufficient documentation

## 2015-03-31 DIAGNOSIS — IMO0002 Reserved for concepts with insufficient information to code with codable children: Secondary | ICD-10-CM

## 2015-03-31 DIAGNOSIS — F0390 Unspecified dementia without behavioral disturbance: Secondary | ICD-10-CM | POA: Diagnosis not present

## 2015-03-31 DIAGNOSIS — W06XXXA Fall from bed, initial encounter: Secondary | ICD-10-CM | POA: Diagnosis not present

## 2015-03-31 LAB — CBG MONITORING, ED: Glucose-Capillary: 80 mg/dL (ref 65–99)

## 2015-03-31 MED ORDER — LIDOCAINE-EPINEPHRINE 2 %-1:100000 IJ SOLN: 20.0000 mL | Freq: Once | INTRAMUSCULAR | Status: DC

## 2015-03-31 MED ORDER — LIDOCAINE-EPINEPHRINE (PF) 2 %-1:200000 IJ SOLN
20.0000 mL | Freq: Once | INTRAMUSCULAR | Status: AC
  Administered 2015-04-01: 20 mL
  Filled 2015-03-31: qty 20

## 2015-03-31 NOTE — ED Notes (Signed)
[facility] after having a un witness fall, pt fell out of the bed was found by staff member lying on the floor. Pt has right side forehead laceration about 5-6 inches long. Pt taking coumadin on a regular basis, hx of dementia, unable to assess if he had a LOC. BP 130/76, HR 78, R 20, 98% on RA.

## 2015-03-31 NOTE — ED Provider Notes (Signed)
CSN: [id number]     Arrival date & time 03/31/15  2212 History   First MD Initiated Contact with Patient 03/31/15 2257     Chief Complaint  Patient presents with  . Fall     (Consider location/radiation/quality/duration/timing/severity/associated sxs/prior Treatment) HPI  Raymond Rangel. is a(n) 81 y.o. male who presents via EMS for follow-up. He has a past medical history of dementia, diabetes, CAD, on Coumadin. The patient had an unwitnessed fall and was found lying outside of the bed by a staff member. The large laceration to the right side of his forehead. Unknown if he had loss of consciousness. He has no complaints of pain. Patient is unable to give history. There is a level V caveat Past Medical History  Diagnosis Date  . Stroke (HCC) 04-2011  . Irregular heart beat   . Dementia   . DVT (deep venous thrombosis) (HCC)     L leg 04-2011  . Hypercholesteremia   . Arthritis   . Diabetes mellitus   . Infectious colitis 08/07/2012  . CAD (coronary artery disease)     stent 2005  . DJD (degenerative joint disease)   . H/O ETOH abuse   . Recurrent UTI     UTI, MS changes admited 02-2013   Past Surgical History  Procedure Laterality Date  . Left arm    . Rotator cuff repair  2000(L) 1999 (R)   Family History  Problem Relation Age of Onset  . Heart disease Mother   . Diabetes Mother   . Colon cancer Neg Hx   . Prostate cancer Neg Hx   . Heart disease Father    Social History  Substance Use Topics  . Smoking status: Former Smoker -- 1.00 packs/day for 20 years    Types: Cigarettes    Quit date: 06/07/1958  . Smokeless tobacco: Never Used  . Alcohol Use: No     Comment: former abuse    Review of Systems  All other systems reviewed and are negative.      Allergies  Review of patient's allergies indicates no known allergies.  Home Medications   Prior to Admission medications   Medication Sig Start Date End Date Taking? Authorizing Provider  amLODipine  (NORVASC) 5 MG tablet Take 1 tablet (5 mg total) by mouth daily. 02/23/15  Yes Wanda Plump, MD  aspirin EC 81 MG tablet Take 81 mg by mouth daily.   Yes Historical Provider, MD  atorvastatin (LIPITOR) 40 MG tablet Take 1 tablet (40 mg total) by mouth daily. 06/11/14  Yes Wanda Plump, MD  baclofen (LIORESAL) 10 MG tablet Take 0.5 tablets (5 mg total) by mouth 2 (two) times daily. 03/17/15  Yes Wanda Plump, MD  cephALEXin (KEFLEX) 250 MG capsule Take 250 mg by mouth at bedtime.   Yes Historical Provider, MD  Cholecalciferol (VITAMIN D PO) Take 1 tablet by mouth daily with lunch.   Yes Historical Provider, MD  divalproex (DEPAKOTE SPRINKLE) 125 MG capsule TAKE 1 CAPSULE BY MOUTH EVERY MORNING, MAY TAKE 1CAPSULE IN THE EVENING AS NEEDED FOR AGITATION. Patient taking differently: TAKES 1 CAPSULE BY MOUTH TWICE DAILY 02/23/15  Yes Wanda Plump, MD  donepezil (ARICEPT) 5 MG tablet Take 2 tablets (10 mg total) by mouth at bedtime. 03/17/15  Yes Wanda Plump, MD  feeding supplement, GLUCERNA SHAKE, (GLUCERNA SHAKE) LIQD Take 237 mLs by mouth 3 (three) times daily between meals. 05/12/14  Yes Alba Cory, MD  levothyroxine (SYNTHROID,  LEVOTHROID) 25 MCG tablet Take 1 tablet (25 mcg total) by mouth daily before breakfast. 06/11/14  Yes Wanda Plump, MD  oxyCODONE-acetaminophen (PERCOCET/ROXICET) 5-325 MG tablet Take 1 tablet by mouth every 6 (six) hours as needed for moderate pain. 03/19/15  Yes Jerald Kief, MD  promethazine (PHENERGAN) 25 MG/ML injection Inject 25 mg into the vein every 6 (six) hours as needed for nausea or vomiting.   Yes Historical Provider, MD  warfarin (COUMADIN) 5 MG tablet Take 1 tablet (5 mg total) by mouth one time only at 6 PM. Patient taking differently: Take 7.5 mg by mouth one time only at 6 PM.  03/19/15  Yes Jerald Kief, MD  enoxaparin (LOVENOX) 150 MG/ML injection Inject 0.57 mLs (85 mg total) into the skin daily. 03/19/15   Jerald Kief, MD   BP 133/78 mmHg  Pulse 108  Temp(Src)  98.3 F (36.8 C) (Oral)  Resp 24  Ht  (1.753 m)  Wt 57.153 kg  BMI 18.60 kg/m2  SpO2 100% Physical Exam  Constitutional: He is oriented to person, place, and time. He appears well-developed and well-nourished. No distress.  HENT:  Head: Normocephalic and atraumatic.  5 cm laceration to the R forehead- Stellate.  Eyes: Conjunctivae are normal. No scleral icterus.  Neck: Normal range of motion. Neck supple.  Cardiovascular: Normal rate, regular rhythm and normal heart sounds.   Pulmonary/Chest: Effort normal and breath sounds normal. No respiratory distress.  Abdominal: Soft. There is no tenderness.  Musculoskeletal: He exhibits no edema.  Neurological: He is alert and oriented to person, place, and time.  Skin: Skin is warm and dry. He is not diaphoretic.  Psychiatric: His behavior is normal.  Nursing note and vitals reviewed.   ED Course  Procedures (including critical care time) Labs Review Labs Reviewed  PROTIME-INR - Abnormal; Notable for the following:    Prothrombin Time 27.3 (*)    INR 2.58 (*)    All other components within normal limits  CBC - Abnormal; Notable for the following:    RBC 3.95 (*)    Hemoglobin 11.1 (*)    HCT 35.1 (*)    RDW 15.9 (*)    All other components within normal limits  COMPREHENSIVE METABOLIC PANEL - Abnormal; Notable for the following:    Potassium 3.4 (*)    Glucose, Bld 101 (*)    Albumin 3.2 (*)    All other components within normal limits  CK  CBG MONITORING, ED    Imaging Review No results found. I have personally reviewed and evaluated these images and lab results as part of my medical decision-making.   EKG Interpretation   Date/Time:  Tuesday March 31 2015 22:25:23 EST Ventricular Rate:  103 PR Interval:  164 QRS Duration: 141 QT Interval:  402 QTC Calculation: 526 R Axis:   -73 Text Interpretation:  Sinus tachycardia Left atrial enlargement Right  bundle branch block Probable inferior infarct, acute  Lateral leads are  also involved Artifact in lead(s) I II III aVR aVL aVF V1 V2 V3 V4 V5 V6  No significant change since last tracing Confirmed by Freida Busman  MD, ANTHONY  ([id number]) on 03/31/2015 10:42:07 PM      LACERATION REPAIR Performed by: Arthor Captain Authorized by: Arthor Captain Consent: Verbal consent obtained. Risks and benefits: risks, benefits and alternatives were discussed Consent given by: patient Patient identity confirmed: provided demographic data Prepped and Draped in normal sterile fashion Wound explored  Laceration Location: forehead  Laceration Length: 5 cm  No Foreign Bodies seen or palpated  Anesthesia: local infiltration  Local anesthetic: lidocaine 2 % w/o epinephrine  Anesthetic total: 10 ml  Irrigation method: syringe Amount of cleaning: standard  Skin closure: 5.0   Number of sutures: 7  Technique: SI  Patient tolerance: Patient tolerated the procedure well with no immediate complications.   MDM   Final diagnoses:  Fall    Elderly patient with dementia. Fall at nursing home. At baseline mental status. No signs of injury on PE.  Negative labs and imaging. INR therapeutic Laceration repaired with no bleeding. Appears safe to return to SNF     Arthor CaptainAbigail Jamaia Brum, PA-C 04/01/15 0156  Lorre NickAnthony Allen, MD 04/02/15 [phone]

## 2015-04-01 ENCOUNTER — Emergency Department (HOSPITAL_BASED_OUTPATIENT_CLINIC_OR_DEPARTMENT_OTHER)
Admission: EM | Admit: 2015-04-01 | Discharge: 2015-04-01 | Disposition: A | Discharge status: Home / Self Care | Payer: Medicare Other | Source: Home / Self Care | Attending: Emergency Medicine | Admitting: Emergency Medicine

## 2015-04-01 ENCOUNTER — Encounter (HOSPITAL_BASED_OUTPATIENT_CLINIC_OR_DEPARTMENT_OTHER): Payer: Self-pay | Admitting: *Deleted

## 2015-04-01 ENCOUNTER — Ambulatory Visit (INDEPENDENT_AMBULATORY_CARE_PROVIDER_SITE_OTHER): Payer: Medicare Other | Admitting: Internal Medicine

## 2015-04-01 ENCOUNTER — Encounter (HOSPITAL_COMMUNITY): Payer: Self-pay | Admitting: Radiology

## 2015-04-01 ENCOUNTER — Encounter: Payer: Self-pay | Admitting: Internal Medicine

## 2015-04-01 DIAGNOSIS — Z86718 Personal history of other venous thrombosis and embolism: Secondary | ICD-10-CM

## 2015-04-01 DIAGNOSIS — Z7982 Long term (current) use of aspirin: Secondary | ICD-10-CM | POA: Insufficient documentation

## 2015-04-01 DIAGNOSIS — M1990 Unspecified osteoarthritis, unspecified site: Secondary | ICD-10-CM

## 2015-04-01 DIAGNOSIS — E7800 Pure hypercholesterolemia, unspecified: Secondary | ICD-10-CM | POA: Insufficient documentation

## 2015-04-01 DIAGNOSIS — W06XXXD Fall from bed, subsequent encounter: Secondary | ICD-10-CM

## 2015-04-01 DIAGNOSIS — F0390 Unspecified dementia without behavioral disturbance: Secondary | ICD-10-CM | POA: Insufficient documentation

## 2015-04-01 DIAGNOSIS — Z87440 Personal history of urinary (tract) infections: Secondary | ICD-10-CM | POA: Insufficient documentation

## 2015-04-01 DIAGNOSIS — Z8673 Personal history of transient ischemic attack (TIA), and cerebral infarction without residual deficits: Secondary | ICD-10-CM | POA: Insufficient documentation

## 2015-04-01 DIAGNOSIS — Z79899 Other long term (current) drug therapy: Secondary | ICD-10-CM | POA: Insufficient documentation

## 2015-04-01 DIAGNOSIS — Z7901 Long term (current) use of anticoagulants: Secondary | ICD-10-CM | POA: Insufficient documentation

## 2015-04-01 DIAGNOSIS — I2510 Atherosclerotic heart disease of native coronary artery without angina pectoris: Secondary | ICD-10-CM | POA: Insufficient documentation

## 2015-04-01 DIAGNOSIS — S0181XD Laceration without foreign body of other part of head, subsequent encounter: Secondary | ICD-10-CM

## 2015-04-01 DIAGNOSIS — Z8619 Personal history of other infectious and parasitic diseases: Secondary | ICD-10-CM

## 2015-04-01 DIAGNOSIS — F17210 Nicotine dependence, cigarettes, uncomplicated: Secondary | ICD-10-CM

## 2015-04-01 LAB — COMPREHENSIVE METABOLIC PANEL
ALK PHOS: 101 U/L (ref 38–126)
ALT: 23 U/L (ref 17–63)
AST: 31 U/L (ref 15–41)
Albumin: 3.2 g/dL — ABNORMAL LOW (ref 3.5–5.0)
Anion gap: 10 (ref 5–15)
BUN: 10 mg/dL (ref 6–20)
CALCIUM: 9 mg/dL (ref 8.9–10.3)
CHLORIDE: 110 mmol/L (ref 101–111)
CO2: 24 mmol/L (ref 22–32)
CREATININE: 0.78 mg/dL (ref 0.61–1.24)
Glucose, Bld: 101 mg/dL — ABNORMAL HIGH (ref 65–99)
Potassium: 3.4 mmol/L — ABNORMAL LOW (ref 3.5–5.1)
Sodium: 144 mmol/L (ref 135–145)
Total Bilirubin: 0.5 mg/dL (ref 0.3–1.2)
Total Protein: 6.9 g/dL (ref 6.5–8.1)

## 2015-04-01 LAB — CBC
HCT: 35.1 % — ABNORMAL LOW (ref 39.0–52.0)
HEMOGLOBIN: 11.1 g/dL — AB (ref 13.0–17.0)
MCH: 28.1 pg (ref 26.0–34.0)
MCHC: 31.6 g/dL (ref 30.0–36.0)
MCV: 88.9 fL (ref 78.0–100.0)
PLATELETS: 197 10*3/uL (ref 150–400)
RBC: 3.95 MIL/uL — AB (ref 4.22–5.81)
RDW: 15.9 % — ABNORMAL HIGH (ref 11.5–15.5)
WBC: 9.6 10*3/uL (ref 4.0–10.5)

## 2015-04-01 LAB — PROTIME-INR
INR: 2.58 — ABNORMAL HIGH (ref 0.00–1.49)
Prothrombin Time: 27.3 seconds — ABNORMAL HIGH (ref 11.6–15.2)

## 2015-04-01 LAB — CK: CK TOTAL: 281 U/L (ref 49–397)

## 2015-04-01 NOTE — Discharge Instructions (Signed)
Follow-up for suture removal 7-10 days.

## 2015-04-01 NOTE — ED Notes (Signed)
[facility] called @0207 

## 2015-04-01 NOTE — ED Notes (Signed)
Pt family asking how long the ambulance will be. Explained to pt daughter that I could not give her a time. Also that Charge RN will call again to check with [facility].

## 2015-04-01 NOTE — ED Notes (Signed)
[place] dispatch phoned for update re: pt transport back to [facility]. Dispatcher states "you are on the list and there are a few convalescent transports in front of you." hazel, rn updated.

## 2015-04-01 NOTE — Discharge Instructions (Signed)
Fall Prevention in Hospitals, Adult °As a hospital patient, your condition and the treatments you receive can increase your risk for falls. Some additional risk factors for falls in a hospital include: °· Being in an unfamiliar environment. °· Being on bed rest. °· Your surgery. °· Taking certain medicines. °· Your tubing requirements, such as intravenous (IV) therapy or catheters. °It is important that you learn how to decrease fall risks while at the hospital. Below are important tips that can help prevent falls. °SAFETY TIPS FOR PREVENTING FALLS °Talk about your risk of falling. °· Ask your health care provider why you are at risk for falling. Is it your medicine, illness, tubing placement, or something else? °· Make a plan with your health care provider to keep you safe from falls. °· Ask your health care provider or pharmacist about side effects of your medicines. Some medicines can make you dizzy or affect your coordination. °Ask for help. °· Ask for help before getting out of bed. You may need to press your call button. °· Ask for assistance in getting safely to the toilet. °· Ask for a walker or cane to be put at your bedside. Ask that most of the side rails on your bed be placed up before your health care provider leaves the room. °· Ask family or friends to sit with you. °· Ask for things that are out of your reach, such as your glasses, hearing aids, telephone, bedside table, or call button. °Follow these tips to avoid falling: °· Stay lying or seated, rather than standing, while waiting for help. °· Wear rubber-soled slippers or shoes whenever you walk in the hospital. °· Avoid quick, sudden movements. °¨ Change positions slowly. °¨ Sit on the side of your bed before standing. °¨ Stand up slowly and wait before you start to walk. °· Let your health care provider know if there is a spill on the floor. °· Pay careful attention to the medical equipment, electrical cords, and tubes around you. °· When you  need help, use your call button by your bed or in the bathroom. Wait for one of your health care providers to help you. °· If you feel dizzy or unsure of your footing, return to bed and wait for assistance. °· Avoid being distracted by the TV, telephone, or another person in your room. °· Do not lean or support yourself on rolling objects, such as IV poles or bedside tables. °  °This information is not intended to replace advice given to you by your health care provider. Make sure you discuss any questions you have with your health care provider. °  °Document Released: 03/25/2000 Document Revised: 04/18/2014 Document Reviewed: 12/04/2011 °Elsevier Interactive Patient Education ©2016 [employer] ° °Laceration Care, Adult °A laceration is a cut that goes through all of the layers of the skin and into the tissue that is right under the skin. Some lacerations heal on their own. Others need to be closed with stitches (sutures), staples, skin adhesive strips, or skin glue. Proper laceration care minimizes the risk of infection and helps the laceration to heal better. °HOW TO CARE FOR YOUR LACERATION °If sutures or staples were used: °· Keep the wound clean and dry. °· If you were given a bandage (dressing), you should change it at least one time per day or as told by your health care provider. You should also change it if it becomes wet or dirty. °· Keep the wound completely dry for the first 24 hours or   as told by your health care provider. After that time, you may shower or bathe. However, make sure that the wound is not soaked in water until after the sutures or staples have been removed. °· Clean the wound one time each day or as told by your health care provider: °¨ Wash the wound with soap and water. °¨ Rinse the wound with water to remove all soap. °¨ Pat the wound dry with a clean towel. Do not rub the wound. °· After cleaning the wound, apply a thin layer of antibiotic ointment as told by your health care  provider. This will help to prevent infection and keep the dressing from sticking to the wound. °· Have the sutures or staples removed as told by your health care provider. °If skin adhesive strips were used: °· Keep the wound clean and dry. °· If you were given a bandage (dressing), you should change it at least one time per day or as told by your health care provider. You should also change it if it becomes dirty or wet. °· Do not get the skin adhesive strips wet. You may shower or bathe, but be careful to keep the wound dry. °· If the wound gets wet, pat it dry with a clean towel. Do not rub the wound. °· Skin adhesive strips fall off on their own. You may trim the strips as the wound heals. Do not remove skin adhesive strips that are still stuck to the wound. They will fall off in time. °If skin glue was used: °· Try to keep the wound dry, but you may briefly wet it in the shower or bath. Do not soak the wound in water, such as by swimming. °· After you have showered or bathed, gently pat the wound dry with a clean towel. Do not rub the wound. °· Do not do any activities that will make you sweat heavily until the skin glue has fallen off on its own. °· Do not apply liquid, cream, or ointment medicine to the wound while the skin glue is in place. Using those may loosen the film before the wound has healed. °· If you were given a bandage (dressing), you should change it at least one time per day or as told by your health care provider. You should also change it if it becomes dirty or wet. °· If a dressing is placed over the wound, be careful not to apply tape directly over the skin glue. Doing that may cause the glue to be pulled off before the wound has healed. °· Do not pick at the glue. The skin glue usually remains in place for 5-10 days, then it falls off of the skin. °General Instructions °· Take over-the-counter and prescription medicines only as told by your health care provider. °· If you were prescribed  an antibiotic medicine or ointment, take or apply it as told by your doctor. Do not stop using it even if your condition improves. °· To help prevent scarring, make sure to cover your wound with sunscreen whenever you are outside after stitches are removed, after adhesive strips are removed, or when glue remains in place and the wound is healed. Make sure to wear a sunscreen of at least 30 SPF. °· Do not scratch or pick at the wound. °· Keep all follow-up visits as told by your health care provider. This is important. °· Check your wound every day for signs of infection. Watch for: °¨ Redness, swelling, or pain. °¨ Fluid, blood, or   pus. °· Raise (elevate) the injured area above the level of your heart while you are sitting or lying down, if possible. °SEEK MEDICAL CARE IF: °· You received a tetanus shot and you have swelling, severe pain, redness, or bleeding at the injection site. °· You have a fever. °· A wound that was closed breaks open. °· You notice a bad smell coming from your wound or your dressing. °· You notice something coming out of the wound, such as wood or glass. °· Your pain is not controlled with medicine. °· You have increased redness, swelling, or pain at the site of your wound. °· You have fluid, blood, or pus coming from your wound. °· You notice a change in the color of your skin near your wound. °· You need to change the dressing frequently due to fluid, blood, or pus draining from the wound. °· You develop a new rash. °· You develop numbness around the wound. °SEEK IMMEDIATE MEDICAL CARE IF: °· You develop severe swelling around the wound. °· Your pain suddenly increases and is severe. °· You develop painful lumps near the wound or on skin that is anywhere on your body. °· You have a red streak going away from your wound. °· The wound is on your hand or foot and you cannot properly move a finger or toe. °· The wound is on your hand or foot and you notice that your fingers or toes look pale or  bluish. °  °This information is not intended to replace advice given to you by your health care provider. Make sure you discuss any questions you have with your health care provider. °  °Document Released: 03/28/2005 Document Revised: 08/12/2014 Document Reviewed: 03/24/2014 °Elsevier Interactive Patient Education ©2016 [employer] ° °

## 2015-04-01 NOTE — Progress Notes (Deleted)
Pre visit review using our clinic review tool, if applicable. No additional management support is needed unless otherwise documented below in the visit note. 

## 2015-04-01 NOTE — ED Notes (Signed)
Pt given macarroni and cheese per daughter request. Daughter to feed him. Crackers and cheese given to Daughter.

## 2015-04-01 NOTE — ED Notes (Signed)
Patient transported to CT 

## 2015-04-01 NOTE — ED Notes (Signed)
Family requested d/c instructions from last pm visit. Copies given to wife and daughter.
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
## 2015-04-01 NOTE — ED Notes (Signed)
Arrived via [facility] from upstairs MD office with c/o of low 02 sats. Pt hands are cold on assessment. Pulse ox to ear reading 100% on RA. Pt lips are pink and no distress is noted. PT was [facility] last pm after fall from bed. PT has hematoma to forehead with stitches noted. Also Family states pt has known blood clot in left leg. 2+ pedals in left foot.

## 2015-04-03 NOTE — Progress Notes (Signed)
Seen at the ER

## 2015-04-03 NOTE — Telephone Encounter (Signed)
Called to follow up on patient discharge.  Spoke to PearlingtonShalonda who says that patient is still at [facility].  She says due to insurance, he is scheduled for discharge on 04/07/15 and that she would be glad to contact office to make us aware of when he is discharged for TCM.

## 2015-04-08 NOTE — Telephone Encounter (Signed)
Caller name: Casimiro NeedleShalonda  [facility] pt[facility]  Call back number: [phone]   Reason for call:  Casimiro NeedleShalonda wanted to inform you patient discharged [facility] 04/07/15

## 2015-04-09 ENCOUNTER — Telehealth: Payer: Self-pay

## 2015-04-09 ENCOUNTER — Other Ambulatory Visit: Payer: Self-pay

## 2015-04-09 MED ORDER — WARFARIN SODIUM 5 MG PO TABS: 5.0000 mg | ORAL_TABLET | Freq: Every day | ORAL | Status: DC

## 2015-04-09 NOTE — Telephone Encounter (Signed)
Noted, thx.

## 2015-04-09 NOTE — Telephone Encounter (Signed)
Per Buzz with Home Health patient has PT of 49.0 and INR of 4.1. Patient takes Coumadin 7.5 mg every evening and is on Keflex 250 mg for UTI at hs. Please advise  Phone number [phone] and Daughters number [phone].

## 2015-04-09 NOTE — Telephone Encounter (Signed)
Hold the next 3 Coumadin doses. Restart Coumadin at 5 mg every night INR in 2 weeks, call with results. Please be sure the patient is not taking Lovenox

## 2015-04-09 NOTE — Telephone Encounter (Signed)
Called Buzz with Home Health. States patient is NOT taking Lovenox. Advised of new directions for Coumadin 5 mg tablets and Next INR check. Per Buzz home health nurse patient needs to be seen for post fall. States patient has stitches that needs to be removed. Patient scheduled for follow up visit. Nurse voiced understanding Coumadin 5 mg called in to pharmacy.

## 2015-04-14 ENCOUNTER — Telehealth: Payer: Self-pay | Admitting: *Deleted

## 2015-04-14 DIAGNOSIS — M6281 Muscle weakness (generalized): Secondary | ICD-10-CM | POA: Diagnosis not present

## 2015-04-14 DIAGNOSIS — I5030 Unspecified diastolic (congestive) heart failure: Secondary | ICD-10-CM | POA: Diagnosis not present

## 2015-04-14 DIAGNOSIS — I82491 Acute embolism and thrombosis of other specified deep vein of right lower extremity: Secondary | ICD-10-CM | POA: Diagnosis not present

## 2015-04-14 DIAGNOSIS — E119 Type 2 diabetes mellitus without complications: Secondary | ICD-10-CM | POA: Diagnosis not present

## 2015-04-14 DIAGNOSIS — I2510 Atherosclerotic heart disease of native coronary artery without angina pectoris: Secondary | ICD-10-CM | POA: Diagnosis not present

## 2015-04-14 DIAGNOSIS — I639 Cerebral infarction, unspecified: Secondary | ICD-10-CM | POA: Diagnosis not present

## 2015-04-14 NOTE — Telephone Encounter (Signed)
Forwarded to Dr. Paz. JG//CMA 

## 2015-04-16 NOTE — Telephone Encounter (Signed)
Pt has a follow up appt with Dr. Drue NovelPaz on Friday, 04/17/15

## 2015-04-17 ENCOUNTER — Other Ambulatory Visit: Payer: Self-pay | Admitting: Internal Medicine

## 2015-04-17 ENCOUNTER — Ambulatory Visit (INDEPENDENT_AMBULATORY_CARE_PROVIDER_SITE_OTHER): Payer: Medicare Other | Admitting: Internal Medicine

## 2015-04-17 ENCOUNTER — Encounter: Payer: Self-pay | Admitting: Internal Medicine

## 2015-04-17 VITALS — BP 122/74 | HR 74 | Temp 97.7°F | Resp 16 | Ht 66.0 in | Wt 123.0 lb

## 2015-04-17 DIAGNOSIS — E039 Hypothyroidism, unspecified: Secondary | ICD-10-CM | POA: Diagnosis not present

## 2015-04-17 DIAGNOSIS — E119 Type 2 diabetes mellitus without complications: Secondary | ICD-10-CM

## 2015-04-17 DIAGNOSIS — D649 Anemia, unspecified: Secondary | ICD-10-CM | POA: Diagnosis not present

## 2015-04-17 DIAGNOSIS — I824Z1 Acute embolism and thrombosis of unspecified deep veins of right distal lower extremity: Secondary | ICD-10-CM

## 2015-04-17 DIAGNOSIS — R0989 Other specified symptoms and signs involving the circulatory and respiratory systems: Secondary | ICD-10-CM

## 2015-04-17 DIAGNOSIS — E1159 Type 2 diabetes mellitus with other circulatory complications: Secondary | ICD-10-CM

## 2015-04-17 LAB — BASIC METABOLIC PANEL
BUN: 9 mg/dL (ref 7–25)
CALCIUM: 9.5 mg/dL (ref 8.6–10.3)
CHLORIDE: 105 mmol/L (ref 98–110)
CO2: 27 mmol/L (ref 20–31)
CREATININE: 0.78 mg/dL (ref 0.70–1.11)
Glucose, Bld: 101 mg/dL — ABNORMAL HIGH (ref 65–99)
Potassium: 3.8 mmol/L (ref 3.5–5.3)
Sodium: 144 mmol/L (ref 135–146)

## 2015-04-17 LAB — IRON: Iron: 54 ug/dL (ref 50–180)

## 2015-04-17 LAB — HEMOGLOBIN: HEMOGLOBIN: 13 g/dL (ref 13.0–17.0)

## 2015-04-17 LAB — HEMOGLOBIN A1C
Hgb A1c MFr Bld: 6.1 % — ABNORMAL HIGH (ref <5.7)
Mean Plasma Glucose: 128 mg/dL — ABNORMAL HIGH (ref <117)

## 2015-04-17 MED ORDER — DONEPEZIL HCL 10 MG PO TABS: 10.0000 mg | ORAL_TABLET | Freq: Every day | ORAL | Status: DC

## 2015-04-17 MED ORDER — DONEPEZIL HCL 10 MG PO TABS: 10.0000 mg | ORAL_TABLET | Freq: Every day | ORAL | Status: AC

## 2015-04-17 MED FILL — DONEPEZIL HCL 10 MG TABLET: 10 | 30 days supply | Qty: 30 | Fill #0

## 2015-04-17 NOTE — Telephone Encounter (Signed)
Awaiting OV later today at 4 PM.
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
## 2015-04-17 NOTE — Progress Notes (Signed)
Pre visit review using our clinic review tool, if applicable. No additional management support is needed unless otherwise documented below in the visit note. 

## 2015-04-17 NOTE — Telephone Encounter (Signed)
Received request for Donepezil 5 mg.  Last OV: 04/01/2015, appt today 04/17/2015 at 4 for s/p multiple falls Last Fill: 03/17/2015 #60 and 0 RF   Please advise.

## 2015-04-17 NOTE — Telephone Encounter (Signed)
Faxed to [facility] successfully, sent for scanning. JG//CMA

## 2015-04-17 NOTE — Telephone Encounter (Signed)
Ok 60 and 6 RF 

## 2015-04-17 NOTE — Patient Instructions (Addendum)
BEFORE YOU LEAVE THE OFFICE: GO TO THE LAB  Get the blood work    GO TO THE FRONT DESK  Schedule a routine office visit or check up to be done in  3 months  No  fasting     AFTER YOU LEAVE THE OFFICE:  keep the wound at the forehead covered with  over-the-counter antibiotic ointment such as Neosporin. If he bleeds: Apply pressure for 5-10 minutes If the bleeding does not stop --- let us know Okay to use a Band-Aid   Call anytime if you see evidence of infection such as redness, discharge, fever chills   Hold the next two  Coumadin doses Then take Coumadin 5 mg everyday except Tuesdays and Fridays take only 2.5 mg (half tablet) Come back to the office for a coumadin checkup in 2 weeks

## 2015-04-17 NOTE — Telephone Encounter (Signed)
Rx sent 

## 2015-04-18 LAB — FERRITIN: Ferritin: 115 ng/mL (ref 22–322)

## 2015-04-18 LAB — TSH: TSH: 2.305 u[IU]/mL (ref 0.350–4.500)

## 2015-04-21 ENCOUNTER — Telehealth: Payer: Self-pay | Admitting: *Deleted

## 2015-04-21 NOTE — Telephone Encounter (Signed)
Received fax from [facility] for PA on Donepezil, completed form and fax to [phone]; Awaiting decision/SLS 01/10

## 2015-04-22 ENCOUNTER — Telehealth: Payer: Self-pay | Admitting: Internal Medicine

## 2015-04-22 ENCOUNTER — Other Ambulatory Visit: Payer: Self-pay | Admitting: Internal Medicine

## 2015-04-22 DIAGNOSIS — R0989 Other specified symptoms and signs involving the circulatory and respiratory systems: Secondary | ICD-10-CM

## 2015-04-22 NOTE — Telephone Encounter (Signed)
ASHLEY, please call the patient's daughter: The reason I am always reluctant to allow the patient is to have INRs elsewhere is because often times the reports are faxed to me and only come to my attention days later no matter how hard we try. If the daughter is committed to call our office one day after the INR was drawn  to be sure there is no break in the communication then is okay with me. Please be sure to daughter verbalize understanding of this. Next Coumadin check is by 05/01/2015

## 2015-04-22 NOTE — Telephone Encounter (Signed)
Received fax from [facility] stating that medication was on the pt's Insurance plan's list of covered drugs, not requiring PA; called pharmacy with this ing=formation, Rx went through, unknown as to why PA was originally needed?/SLS

## 2015-04-22 NOTE — Telephone Encounter (Signed)
.  Caller name: Jessie Footoyka   Relationship to patient: daughter   Can be reached: [phone]    Reason for call: She says that it is difficult getting her father in and out of the car, bringing him to appt's for INR. Pt's daughter would like to know if she can have a referral / orders to have a nurse come out as often as his INR's are needed. She says that this was suggested by [facility] which has the [employer] [facility]. She says that they will fax over form to have PCP filled them out if he is okay with it.

## 2015-04-22 NOTE — Telephone Encounter (Signed)
Called daughter.  NO answer.  Unable to leave a message.  Mailbox full.  Will call back.

## 2015-04-22 NOTE — Telephone Encounter (Signed)
Please advise 

## 2015-04-23 DIAGNOSIS — I2510 Atherosclerotic heart disease of native coronary artery without angina pectoris: Secondary | ICD-10-CM | POA: Diagnosis not present

## 2015-04-23 DIAGNOSIS — I82491 Acute embolism and thrombosis of other specified deep vein of right lower extremity: Secondary | ICD-10-CM | POA: Diagnosis not present

## 2015-04-23 DIAGNOSIS — E119 Type 2 diabetes mellitus without complications: Secondary | ICD-10-CM | POA: Diagnosis not present

## 2015-04-23 DIAGNOSIS — M6281 Muscle weakness (generalized): Secondary | ICD-10-CM | POA: Diagnosis not present

## 2015-04-23 DIAGNOSIS — I639 Cerebral infarction, unspecified: Secondary | ICD-10-CM | POA: Diagnosis not present

## 2015-04-23 DIAGNOSIS — I5030 Unspecified diastolic (congestive) heart failure: Secondary | ICD-10-CM | POA: Diagnosis not present

## 2015-04-23 NOTE — Telephone Encounter (Signed)
Called and spoke to daughter regarding provider's note below.  She stated understanding and agreed to call to ensure no break down in communication.  She says that she will call nurse now to schedule next INR check.

## 2015-04-28 DIAGNOSIS — I639 Cerebral infarction, unspecified: Secondary | ICD-10-CM | POA: Diagnosis not present

## 2015-04-28 DIAGNOSIS — M6281 Muscle weakness (generalized): Secondary | ICD-10-CM | POA: Diagnosis not present

## 2015-04-28 DIAGNOSIS — E119 Type 2 diabetes mellitus without complications: Secondary | ICD-10-CM | POA: Diagnosis not present

## 2015-04-28 DIAGNOSIS — I5030 Unspecified diastolic (congestive) heart failure: Secondary | ICD-10-CM | POA: Diagnosis not present

## 2015-04-28 DIAGNOSIS — I82491 Acute embolism and thrombosis of other specified deep vein of right lower extremity: Secondary | ICD-10-CM | POA: Diagnosis not present

## 2015-04-28 DIAGNOSIS — I2510 Atherosclerotic heart disease of native coronary artery without angina pectoris: Secondary | ICD-10-CM | POA: Diagnosis not present

## 2015-04-29 ENCOUNTER — Telehealth: Payer: Self-pay | Admitting: Internal Medicine

## 2015-04-29 NOTE — Telephone Encounter (Signed)
Caller name: Marcelino Duster  Relationship to patient: [facility] Can be reached: [phone]  Reason for call: FYI: They do not have an INR machine to check the patients INR. States she wanted someone to be aware because patient is due to have INR check tomorrow. Has tried to get in touch with his daughter but can not reach her. Not sure if plan is already in place for patient to get this done. Plse call her if you have any questions.

## 2015-04-29 NOTE — Telephone Encounter (Signed)
See phone note from 04/22/2015, Pt's daughter has set up INR checks w/ [facility].

## 2015-04-30 ENCOUNTER — Ambulatory Visit (INDEPENDENT_AMBULATORY_CARE_PROVIDER_SITE_OTHER): Payer: Medicare Other | Admitting: *Deleted

## 2015-04-30 ENCOUNTER — Telehealth: Payer: Self-pay | Admitting: Internal Medicine

## 2015-04-30 ENCOUNTER — Other Ambulatory Visit: Payer: Self-pay | Admitting: *Deleted

## 2015-04-30 DIAGNOSIS — Z86718 Personal history of other venous thrombosis and embolism: Secondary | ICD-10-CM | POA: Diagnosis not present

## 2015-04-30 DIAGNOSIS — F0390 Unspecified dementia without behavioral disturbance: Secondary | ICD-10-CM

## 2015-04-30 DIAGNOSIS — R600 Localized edema: Secondary | ICD-10-CM

## 2015-04-30 LAB — POCT INR: INR: 1.3

## 2015-04-30 NOTE — Telephone Encounter (Signed)
FYI-   Reason for call:  Patient scheduled INR appointment with the Nurse today at 2:15pm based off the home health nurse INR machine not working .

## 2015-04-30 NOTE — Progress Notes (Signed)
Pre visit review using our clinic review tool, if applicable. No additional management support is needed unless otherwise documented below in the visit note.  Pt presents for INR check. INR today 1.3. Last INR 3.3 on 04/17/2015 per [name] visit note. Pt's daughter reports Mr. Rawles is on Keflex long term; pt findings otherwise negative. Results reported to [name]; instructed patient to take 5 mg daily and recheck INR in 10 days. Instructions given to pt's daughter who verbalized understanding. Pt's wife and daughter inquired about pt's diet and if he was allowed to eat green vegetables. Education provided regarding maintaining consistent day-to-day dietary intake and avoiding extra servings of green leafy vegetables in addition to what the pt would normally eat.   Pt's daughter voiced concerns regarding the difficulty of getting pt to the office for INR checks as pt is now wheelchair bound and requires assist x2 to transfer from car to wheelchair. Referral to home health for home INR checks placed per [name].

## 2015-04-30 NOTE — Patient Instructions (Signed)
Per [name]: Take 5 mg daily. Recheck INR in 10 days.

## 2015-04-30 NOTE — Telephone Encounter (Signed)
FYI. Please advise.

## 2015-04-30 NOTE — Telephone Encounter (Signed)
Caller name: Sunny Schlein with [facility]  Can be reached: [phone]  Reason for call: They are unable to do the Ultrasound requested at this facility because the daughter says pt is unable to stand. The order needs sent to the hospital or facility that has a lift.

## 2015-04-30 NOTE — Telephone Encounter (Signed)
Noted. Informing [name] and [place] as well.

## 2015-05-01 ENCOUNTER — Telehealth (HOSPITAL_COMMUNITY): Payer: Self-pay | Admitting: Internal Medicine

## 2015-05-01 ENCOUNTER — Inpatient Hospital Stay (HOSPITAL_COMMUNITY): Admission: RE | Admit: 2015-05-01 | Payer: Medicare Other | Source: Ambulatory Visit

## 2015-05-01 NOTE — Addendum Note (Signed)
Addended byConrad Redgranite D on: 05/01/2015 01:22 PM   Modules accepted: Orders

## 2015-05-01 NOTE — Telephone Encounter (Signed)
US Venous R leg re-ordered to [facility].

## 2015-05-01 NOTE — Telephone Encounter (Signed)
Change the order to [facility] please

## 2015-05-04 ENCOUNTER — Ambulatory Visit (HOSPITAL_COMMUNITY)
Admission: RE | Admit: 2015-05-04 | Discharge: 2015-05-04 | Disposition: A | Discharge status: Home / Self Care | Payer: Medicare Other | Source: Ambulatory Visit | Attending: Internal Medicine | Admitting: Internal Medicine

## 2015-05-04 DIAGNOSIS — R600 Localized edema: Secondary | ICD-10-CM | POA: Diagnosis not present

## 2015-05-04 DIAGNOSIS — R948 Abnormal results of function studies of other organs and systems: Secondary | ICD-10-CM | POA: Diagnosis not present

## 2015-05-04 NOTE — Progress Notes (Signed)
*  Preliminary Results* Bilateral lower extremity venous duplex completed. The right lower extremity is positive for deep vein thrombosis involving the right common femoral, saphenofemoral junction, femoral, and gastrocnemius veins. The left lower extremity is negative for deep vein thrombosis. There is no evidence of Baker's cyst bilaterally.  Preliminary results discussed with [name].  05/04/2015  Gertie Fey, RVT, RDCS, RDMS

## 2015-05-05 ENCOUNTER — Telehealth: Payer: Self-pay | Admitting: Internal Medicine

## 2015-05-05 DIAGNOSIS — I639 Cerebral infarction, unspecified: Secondary | ICD-10-CM | POA: Diagnosis not present

## 2015-05-05 DIAGNOSIS — I5030 Unspecified diastolic (congestive) heart failure: Secondary | ICD-10-CM | POA: Diagnosis not present

## 2015-05-05 DIAGNOSIS — R531 Weakness: Secondary | ICD-10-CM

## 2015-05-05 DIAGNOSIS — F0390 Unspecified dementia without behavioral disturbance: Secondary | ICD-10-CM

## 2015-05-05 DIAGNOSIS — M6281 Muscle weakness (generalized): Secondary | ICD-10-CM | POA: Diagnosis not present

## 2015-05-05 DIAGNOSIS — E119 Type 2 diabetes mellitus without complications: Secondary | ICD-10-CM | POA: Diagnosis not present

## 2015-05-05 DIAGNOSIS — I2510 Atherosclerotic heart disease of native coronary artery without angina pectoris: Secondary | ICD-10-CM | POA: Diagnosis not present

## 2015-05-05 DIAGNOSIS — I82491 Acute embolism and thrombosis of other specified deep vein of right lower extremity: Secondary | ICD-10-CM | POA: Diagnosis not present

## 2015-05-05 NOTE — Telephone Encounter (Signed)
Caller name: [place] Relationship to patient: Daughter Can be reached:[phone]   Reason for call: Daughter called to ask if they can get a referral for [facility] for patient and also to inform [name] that they have not heard from [facility] about getting his INR checked. Needs his INR checked by 1/31

## 2015-05-05 NOTE — Telephone Encounter (Signed)
[facility] was supposed to let Pt's family know that Medicare will not pay for home health agency to go out to check INR. [name] determined that they will just have to come to office to have checked.

## 2015-05-05 NOTE — Addendum Note (Signed)
Addended byConrad Provencal D on: 05/05/2015 03:16 PM   Modules accepted: Orders

## 2015-05-05 NOTE — Telephone Encounter (Signed)
Referral placed to Hospice.

## 2015-05-06 NOTE — Telephone Encounter (Signed)
Pam with [facility] Ph# [phone] directly Referral #  [phone]  Pt family declined hospice care stating they want to continue with the aides that come to home thru community program from a grant and they cannot coexist.

## 2015-05-06 NOTE — Telephone Encounter (Signed)
Noted  

## 2015-05-11 ENCOUNTER — Telehealth: Payer: Self-pay | Admitting: Internal Medicine

## 2015-05-11 DIAGNOSIS — Z7901 Long term (current) use of anticoagulants: Secondary | ICD-10-CM | POA: Diagnosis not present

## 2015-05-11 DIAGNOSIS — Z79899 Other long term (current) drug therapy: Secondary | ICD-10-CM | POA: Diagnosis not present

## 2015-05-11 NOTE — Telephone Encounter (Signed)
Raymond Rangel called to cancel INR appt tomorrow. She said Dr. [employer] came out today. She said his coumadin runs out Thursday. Dr. [facility] comes back out Friday. They are worried about running out of meds and don't know how to proceed. Please call Raymond Rangel [phone].

## 2015-05-11 NOTE — Telephone Encounter (Signed)
Caller name:Toyka, Jones  Relation to pt: daughter  Call back number:[phone]   Reason for call:  Daughter would like to discuss INR appointment. Please advise

## 2015-05-12 ENCOUNTER — Ambulatory Visit: Payer: Medicare Other

## 2015-05-12 MED ORDER — WARFARIN SODIUM 5 MG PO TABS: ORAL_TABLET | ORAL | Status: AC

## 2015-05-12 NOTE — Telephone Encounter (Signed)
LM for [employer]

## 2015-05-12 NOTE — Telephone Encounter (Signed)
Pt will need to be brought in for INR checks as directed. Will give some Coumadin refills but unless they have INR's set up another way she will need to have Pt come in.

## 2015-05-15 DIAGNOSIS — I82401 Acute embolism and thrombosis of unspecified deep veins of right lower extremity: Secondary | ICD-10-CM | POA: Diagnosis not present

## 2015-05-15 DIAGNOSIS — I10 Essential (primary) hypertension: Secondary | ICD-10-CM | POA: Diagnosis not present

## 2015-05-15 DIAGNOSIS — E784 Other hyperlipidemia: Secondary | ICD-10-CM | POA: Diagnosis not present

## 2015-05-19 ENCOUNTER — Telehealth: Payer: Self-pay | Admitting: Internal Medicine

## 2015-05-19 DIAGNOSIS — I2510 Atherosclerotic heart disease of native coronary artery without angina pectoris: Secondary | ICD-10-CM | POA: Diagnosis not present

## 2015-05-19 DIAGNOSIS — I639 Cerebral infarction, unspecified: Secondary | ICD-10-CM | POA: Diagnosis not present

## 2015-05-19 DIAGNOSIS — M6281 Muscle weakness (generalized): Secondary | ICD-10-CM | POA: Diagnosis not present

## 2015-05-19 DIAGNOSIS — I82491 Acute embolism and thrombosis of other specified deep vein of right lower extremity: Secondary | ICD-10-CM | POA: Diagnosis not present

## 2015-05-19 DIAGNOSIS — I5030 Unspecified diastolic (congestive) heart failure: Secondary | ICD-10-CM | POA: Diagnosis not present

## 2015-05-19 DIAGNOSIS — E119 Type 2 diabetes mellitus without complications: Secondary | ICD-10-CM | POA: Diagnosis not present

## 2015-05-19 NOTE — Telephone Encounter (Signed)
Attempted to reach the patient's daughter to schedule appointment for INR check. Left message for the daughter to return call when available.

## 2015-05-19 NOTE — Telephone Encounter (Signed)
FYI. Does Pt need formal dismissal for this?

## 2015-05-19 NOTE — Telephone Encounter (Signed)
Patient is due for an INR, please contact the patient's daughter and set up.

## 2015-05-19 NOTE — Telephone Encounter (Signed)
Thank you. No dissmisal but we won't be refilling   his medications

## 2015-05-19 NOTE — Telephone Encounter (Signed)
Daughter Jessie Foot) called back to inform that patient is now being seen by doctors who make house calls. They are monitoring his INR. Patient will not be coming into the office.

## 2015-05-20 NOTE — Telephone Encounter (Signed)
Noted, [name] name removed from PCP.

## 2015-05-29 ENCOUNTER — Telehealth: Payer: Self-pay

## 2015-05-29 NOTE — Telephone Encounter (Signed)
Form faxed back to [facility] at [phone]. Form sent for scanning into chart.

## 2015-05-29 NOTE — Telephone Encounter (Signed)
That is okay, form signed

## 2015-05-29 NOTE — Telephone Encounter (Signed)
Received fax confirmation on 05/29/2015 at 1139.

## 2015-05-29 NOTE — Telephone Encounter (Signed)
Received fax from [facility] for Plan of Care: Certification Period: 04/09/2015 to 06/07/2015. As you probably remember, we received a call from Pt's daughter, Randa Ngo (her number, [phone]) on 05/19/2015 informing us that Pt is now seeing an MD that makes house calls. Will you be completing form? (Form placed in your red folder).

## 2015-06-03 DIAGNOSIS — E039 Hypothyroidism, unspecified: Secondary | ICD-10-CM | POA: Diagnosis not present

## 2015-06-03 DIAGNOSIS — G309 Alzheimer's disease, unspecified: Secondary | ICD-10-CM | POA: Diagnosis not present

## 2015-06-03 DIAGNOSIS — Z79899 Other long term (current) drug therapy: Secondary | ICD-10-CM | POA: Diagnosis not present

## 2015-06-03 DIAGNOSIS — I10 Essential (primary) hypertension: Secondary | ICD-10-CM | POA: Diagnosis not present

## 2015-06-12 DIAGNOSIS — Z7901 Long term (current) use of anticoagulants: Secondary | ICD-10-CM | POA: Diagnosis not present

## 2015-06-12 DIAGNOSIS — Z79899 Other long term (current) drug therapy: Secondary | ICD-10-CM | POA: Diagnosis not present

## 2015-06-16 DIAGNOSIS — E039 Hypothyroidism, unspecified: Secondary | ICD-10-CM | POA: Diagnosis not present

## 2015-06-16 DIAGNOSIS — G309 Alzheimer's disease, unspecified: Secondary | ICD-10-CM | POA: Diagnosis not present

## 2015-06-16 DIAGNOSIS — I10 Essential (primary) hypertension: Secondary | ICD-10-CM | POA: Diagnosis not present

## 2015-06-16 DIAGNOSIS — Z79899 Other long term (current) drug therapy: Secondary | ICD-10-CM | POA: Diagnosis not present

## 2015-06-16 DIAGNOSIS — Z993 Dependence on wheelchair: Secondary | ICD-10-CM | POA: Diagnosis not present

## 2015-07-16 DIAGNOSIS — Z79899 Other long term (current) drug therapy: Secondary | ICD-10-CM | POA: Diagnosis not present

## 2015-07-16 DIAGNOSIS — Z7901 Long term (current) use of anticoagulants: Secondary | ICD-10-CM | POA: Diagnosis not present

## 2015-07-31 DIAGNOSIS — Z7901 Long term (current) use of anticoagulants: Secondary | ICD-10-CM | POA: Diagnosis not present

## 2015-08-13 ENCOUNTER — Other Ambulatory Visit: Payer: Self-pay | Admitting: Internal Medicine

## 2015-08-25 DIAGNOSIS — Z7901 Long term (current) use of anticoagulants: Secondary | ICD-10-CM | POA: Diagnosis not present

## 2015-08-25 DIAGNOSIS — Z79899 Other long term (current) drug therapy: Secondary | ICD-10-CM | POA: Diagnosis not present

## 2015-08-26 ENCOUNTER — Other Ambulatory Visit: Payer: Self-pay | Admitting: Internal Medicine

## 2015-09-24 DIAGNOSIS — Z7901 Long term (current) use of anticoagulants: Secondary | ICD-10-CM | POA: Diagnosis not present

## 2015-11-03 DIAGNOSIS — Z7901 Long term (current) use of anticoagulants: Secondary | ICD-10-CM | POA: Diagnosis not present

## 2015-12-17 DIAGNOSIS — Z7901 Long term (current) use of anticoagulants: Secondary | ICD-10-CM | POA: Diagnosis not present

## 2016-01-11 DIAGNOSIS — R1310 Dysphagia, unspecified: Secondary | ICD-10-CM | POA: Diagnosis not present

## 2016-01-11 DIAGNOSIS — I10 Essential (primary) hypertension: Secondary | ICD-10-CM | POA: Diagnosis not present

## 2016-01-11 DIAGNOSIS — E039 Hypothyroidism, unspecified: Secondary | ICD-10-CM | POA: Diagnosis not present

## 2016-01-15 DIAGNOSIS — Z792 Long term (current) use of antibiotics: Secondary | ICD-10-CM | POA: Diagnosis not present

## 2016-01-15 DIAGNOSIS — Z993 Dependence on wheelchair: Secondary | ICD-10-CM | POA: Diagnosis not present

## 2016-01-15 DIAGNOSIS — E039 Hypothyroidism, unspecified: Secondary | ICD-10-CM | POA: Diagnosis not present

## 2016-01-15 DIAGNOSIS — N390 Urinary tract infection, site not specified: Secondary | ICD-10-CM | POA: Diagnosis not present

## 2016-01-15 DIAGNOSIS — Z7982 Long term (current) use of aspirin: Secondary | ICD-10-CM | POA: Diagnosis not present

## 2016-01-15 DIAGNOSIS — I10 Essential (primary) hypertension: Secondary | ICD-10-CM | POA: Diagnosis not present

## 2016-01-15 DIAGNOSIS — Z86718 Personal history of other venous thrombosis and embolism: Secondary | ICD-10-CM | POA: Diagnosis not present

## 2016-01-15 DIAGNOSIS — G309 Alzheimer's disease, unspecified: Secondary | ICD-10-CM | POA: Diagnosis not present

## 2016-01-15 DIAGNOSIS — Z7901 Long term (current) use of anticoagulants: Secondary | ICD-10-CM | POA: Diagnosis not present

## 2016-01-15 DIAGNOSIS — R1310 Dysphagia, unspecified: Secondary | ICD-10-CM | POA: Diagnosis not present

## 2016-01-19 DIAGNOSIS — Z86718 Personal history of other venous thrombosis and embolism: Secondary | ICD-10-CM | POA: Diagnosis not present

## 2016-01-19 DIAGNOSIS — E039 Hypothyroidism, unspecified: Secondary | ICD-10-CM | POA: Diagnosis not present

## 2016-01-19 DIAGNOSIS — Z7982 Long term (current) use of aspirin: Secondary | ICD-10-CM | POA: Diagnosis not present

## 2016-01-19 DIAGNOSIS — I10 Essential (primary) hypertension: Secondary | ICD-10-CM | POA: Diagnosis not present

## 2016-01-19 DIAGNOSIS — Z993 Dependence on wheelchair: Secondary | ICD-10-CM | POA: Diagnosis not present

## 2016-01-19 DIAGNOSIS — N390 Urinary tract infection, site not specified: Secondary | ICD-10-CM | POA: Diagnosis not present

## 2016-01-19 DIAGNOSIS — G309 Alzheimer's disease, unspecified: Secondary | ICD-10-CM | POA: Diagnosis not present

## 2016-01-19 DIAGNOSIS — R1310 Dysphagia, unspecified: Secondary | ICD-10-CM | POA: Diagnosis not present

## 2016-01-19 DIAGNOSIS — Z7901 Long term (current) use of anticoagulants: Secondary | ICD-10-CM | POA: Diagnosis not present

## 2016-01-19 DIAGNOSIS — Z792 Long term (current) use of antibiotics: Secondary | ICD-10-CM | POA: Diagnosis not present

## 2016-01-21 DIAGNOSIS — N390 Urinary tract infection, site not specified: Secondary | ICD-10-CM | POA: Diagnosis not present

## 2016-01-21 DIAGNOSIS — Z993 Dependence on wheelchair: Secondary | ICD-10-CM | POA: Diagnosis not present

## 2016-01-21 DIAGNOSIS — E039 Hypothyroidism, unspecified: Secondary | ICD-10-CM | POA: Diagnosis not present

## 2016-01-21 DIAGNOSIS — Z7901 Long term (current) use of anticoagulants: Secondary | ICD-10-CM | POA: Diagnosis not present

## 2016-01-21 DIAGNOSIS — Z86718 Personal history of other venous thrombosis and embolism: Secondary | ICD-10-CM | POA: Diagnosis not present

## 2016-01-21 DIAGNOSIS — Z792 Long term (current) use of antibiotics: Secondary | ICD-10-CM | POA: Diagnosis not present

## 2016-01-21 DIAGNOSIS — I10 Essential (primary) hypertension: Secondary | ICD-10-CM | POA: Diagnosis not present

## 2016-01-21 DIAGNOSIS — Z7982 Long term (current) use of aspirin: Secondary | ICD-10-CM | POA: Diagnosis not present

## 2016-01-21 DIAGNOSIS — G309 Alzheimer's disease, unspecified: Secondary | ICD-10-CM | POA: Diagnosis not present

## 2016-01-21 DIAGNOSIS — R1310 Dysphagia, unspecified: Secondary | ICD-10-CM | POA: Diagnosis not present

## 2016-01-26 DIAGNOSIS — Z7982 Long term (current) use of aspirin: Secondary | ICD-10-CM | POA: Diagnosis not present

## 2016-01-26 DIAGNOSIS — I10 Essential (primary) hypertension: Secondary | ICD-10-CM | POA: Diagnosis not present

## 2016-01-26 DIAGNOSIS — E039 Hypothyroidism, unspecified: Secondary | ICD-10-CM | POA: Diagnosis not present

## 2016-01-26 DIAGNOSIS — Z7901 Long term (current) use of anticoagulants: Secondary | ICD-10-CM | POA: Diagnosis not present

## 2016-01-26 DIAGNOSIS — N390 Urinary tract infection, site not specified: Secondary | ICD-10-CM | POA: Diagnosis not present

## 2016-01-26 DIAGNOSIS — Z86718 Personal history of other venous thrombosis and embolism: Secondary | ICD-10-CM | POA: Diagnosis not present

## 2016-01-26 DIAGNOSIS — Z792 Long term (current) use of antibiotics: Secondary | ICD-10-CM | POA: Diagnosis not present

## 2016-01-26 DIAGNOSIS — G309 Alzheimer's disease, unspecified: Secondary | ICD-10-CM | POA: Diagnosis not present

## 2016-01-26 DIAGNOSIS — R1310 Dysphagia, unspecified: Secondary | ICD-10-CM | POA: Diagnosis not present

## 2016-01-26 DIAGNOSIS — Z993 Dependence on wheelchair: Secondary | ICD-10-CM | POA: Diagnosis not present

## 2016-01-27 DIAGNOSIS — Z7901 Long term (current) use of anticoagulants: Secondary | ICD-10-CM | POA: Diagnosis not present

## 2016-01-27 DIAGNOSIS — Z79899 Other long term (current) drug therapy: Secondary | ICD-10-CM | POA: Diagnosis not present

## 2016-01-28 DIAGNOSIS — I10 Essential (primary) hypertension: Secondary | ICD-10-CM | POA: Diagnosis not present

## 2016-01-28 DIAGNOSIS — Z7982 Long term (current) use of aspirin: Secondary | ICD-10-CM | POA: Diagnosis not present

## 2016-01-28 DIAGNOSIS — Z792 Long term (current) use of antibiotics: Secondary | ICD-10-CM | POA: Diagnosis not present

## 2016-01-28 DIAGNOSIS — R1310 Dysphagia, unspecified: Secondary | ICD-10-CM | POA: Diagnosis not present

## 2016-01-28 DIAGNOSIS — E039 Hypothyroidism, unspecified: Secondary | ICD-10-CM | POA: Diagnosis not present

## 2016-01-28 DIAGNOSIS — Z7901 Long term (current) use of anticoagulants: Secondary | ICD-10-CM | POA: Diagnosis not present

## 2016-01-28 DIAGNOSIS — Z993 Dependence on wheelchair: Secondary | ICD-10-CM | POA: Diagnosis not present

## 2016-01-28 DIAGNOSIS — Z86718 Personal history of other venous thrombosis and embolism: Secondary | ICD-10-CM | POA: Diagnosis not present

## 2016-01-28 DIAGNOSIS — N390 Urinary tract infection, site not specified: Secondary | ICD-10-CM | POA: Diagnosis not present

## 2016-01-28 DIAGNOSIS — G309 Alzheimer's disease, unspecified: Secondary | ICD-10-CM | POA: Diagnosis not present

## 2016-02-02 DIAGNOSIS — Z993 Dependence on wheelchair: Secondary | ICD-10-CM | POA: Diagnosis not present

## 2016-02-02 DIAGNOSIS — Z86718 Personal history of other venous thrombosis and embolism: Secondary | ICD-10-CM | POA: Diagnosis not present

## 2016-02-02 DIAGNOSIS — Z792 Long term (current) use of antibiotics: Secondary | ICD-10-CM | POA: Diagnosis not present

## 2016-02-02 DIAGNOSIS — R1310 Dysphagia, unspecified: Secondary | ICD-10-CM | POA: Diagnosis not present

## 2016-02-02 DIAGNOSIS — E039 Hypothyroidism, unspecified: Secondary | ICD-10-CM | POA: Diagnosis not present

## 2016-02-02 DIAGNOSIS — G309 Alzheimer's disease, unspecified: Secondary | ICD-10-CM | POA: Diagnosis not present

## 2016-02-02 DIAGNOSIS — Z7901 Long term (current) use of anticoagulants: Secondary | ICD-10-CM | POA: Diagnosis not present

## 2016-02-02 DIAGNOSIS — N390 Urinary tract infection, site not specified: Secondary | ICD-10-CM | POA: Diagnosis not present

## 2016-02-02 DIAGNOSIS — I10 Essential (primary) hypertension: Secondary | ICD-10-CM | POA: Diagnosis not present

## 2016-02-02 DIAGNOSIS — Z7982 Long term (current) use of aspirin: Secondary | ICD-10-CM | POA: Diagnosis not present

## 2016-02-04 DIAGNOSIS — Z7982 Long term (current) use of aspirin: Secondary | ICD-10-CM | POA: Diagnosis not present

## 2016-02-04 DIAGNOSIS — Z792 Long term (current) use of antibiotics: Secondary | ICD-10-CM | POA: Diagnosis not present

## 2016-02-04 DIAGNOSIS — Z7901 Long term (current) use of anticoagulants: Secondary | ICD-10-CM | POA: Diagnosis not present

## 2016-02-04 DIAGNOSIS — R1310 Dysphagia, unspecified: Secondary | ICD-10-CM | POA: Diagnosis not present

## 2016-02-04 DIAGNOSIS — E039 Hypothyroidism, unspecified: Secondary | ICD-10-CM | POA: Diagnosis not present

## 2016-02-04 DIAGNOSIS — Z993 Dependence on wheelchair: Secondary | ICD-10-CM | POA: Diagnosis not present

## 2016-02-04 DIAGNOSIS — Z86718 Personal history of other venous thrombosis and embolism: Secondary | ICD-10-CM | POA: Diagnosis not present

## 2016-02-04 DIAGNOSIS — G309 Alzheimer's disease, unspecified: Secondary | ICD-10-CM | POA: Diagnosis not present

## 2016-02-04 DIAGNOSIS — I10 Essential (primary) hypertension: Secondary | ICD-10-CM | POA: Diagnosis not present

## 2016-02-04 DIAGNOSIS — N390 Urinary tract infection, site not specified: Secondary | ICD-10-CM | POA: Diagnosis not present

## 2016-02-15 DIAGNOSIS — Z7901 Long term (current) use of anticoagulants: Secondary | ICD-10-CM | POA: Diagnosis not present

## 2016-02-15 DIAGNOSIS — Z79899 Other long term (current) drug therapy: Secondary | ICD-10-CM | POA: Diagnosis not present

## 2016-03-09 DIAGNOSIS — Z7901 Long term (current) use of anticoagulants: Secondary | ICD-10-CM | POA: Diagnosis not present

## 2016-03-09 DIAGNOSIS — Z23 Encounter for immunization: Secondary | ICD-10-CM | POA: Diagnosis not present

## 2016-03-26 IMAGING — CT CT CERVICAL SPINE W/O CM
4 of 6 series · 14 of 33 positions shown, 16 images · non-contrast
Comparison: Head CT dated 01/05/2015

CLINICAL DATA: 83-year-old male with fall and laceration of the
right side of the forehead.

EXAM:
CT HEAD WITHOUT CONTRAST
CT CERVICAL SPINE WITHOUT CONTRAST
TECHNIQUE: Multidetector CT imaging of the head and cervical spine was
performed following the standard protocol without intravenous
contrast. Multiplanar CT image reconstructions of the cervical spine
were also generated.

[Series 302: soft tissue, idose (2) · axial · 0.29mm/px · z∈[+152,+246]mm · 3 of 95 slices shown]
[im 24/95  soft-tissue]
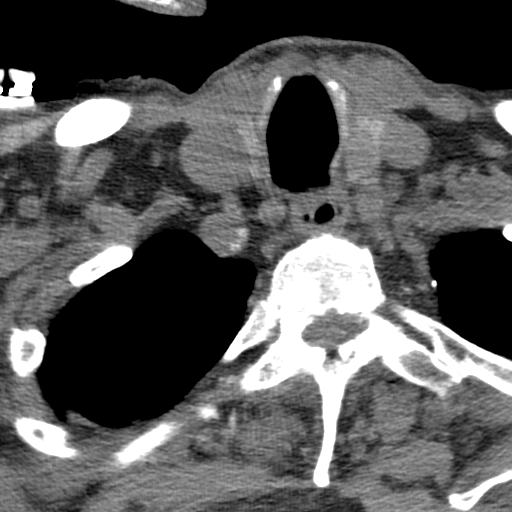
[im 48/95  soft-tissue]
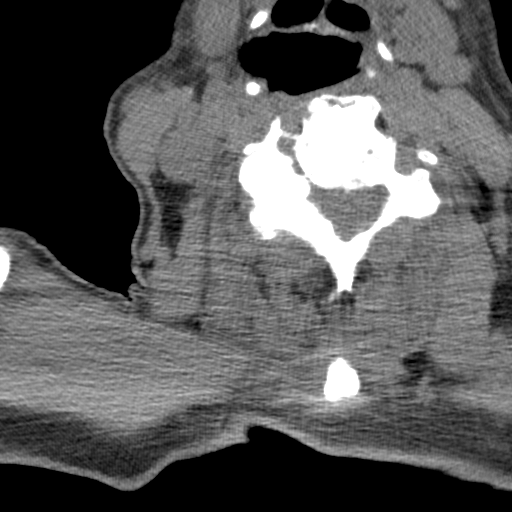
[im 71/95  soft-tissue]
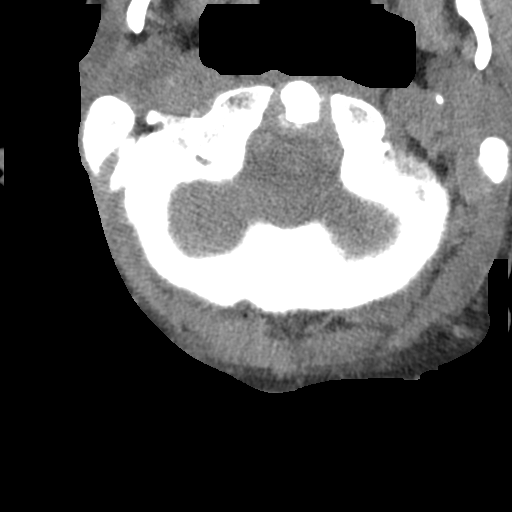

[Series 304: coronal, idose (2) · coronal · 0.35mm/px · 3 of 74 slices shown]
[im 15/74  bone]
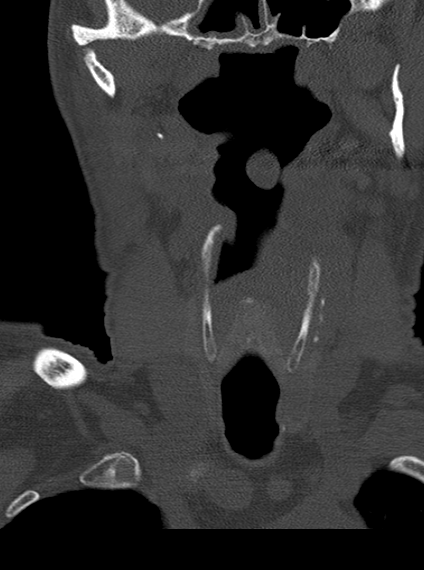
[im 30/74  bone]
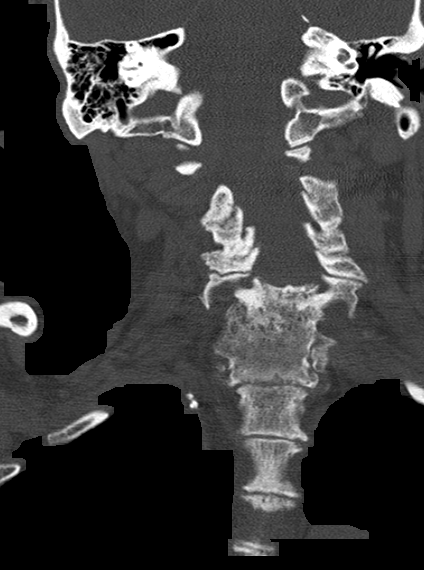
[im 44/74  bone]
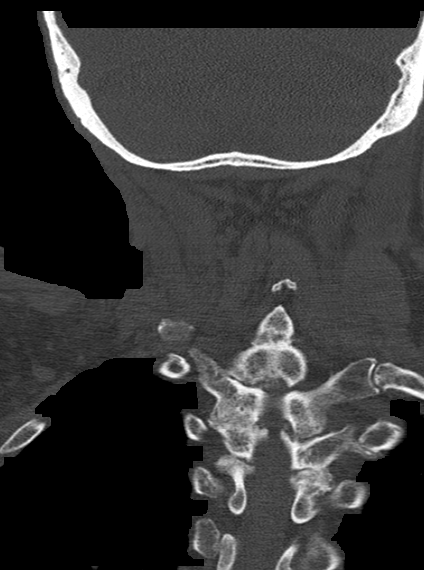

[Series 305: sagittal, idose (2) · sagittal · 0.35mm/px · 5 of 74 slices shown, 6 images]
[im 25/74  bone]
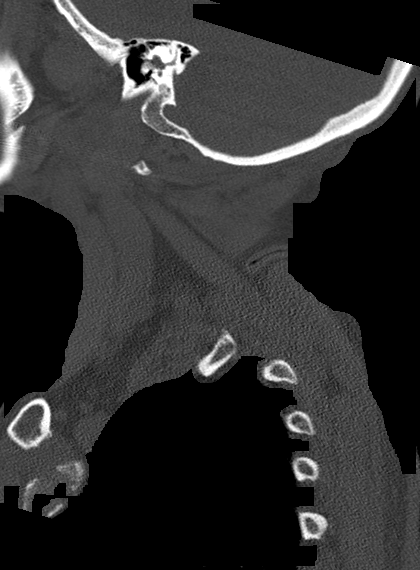
[im 31/74  bone]
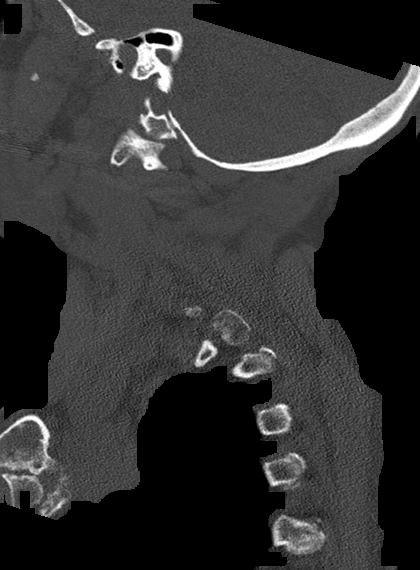
[im 37/74  soft-tissue]
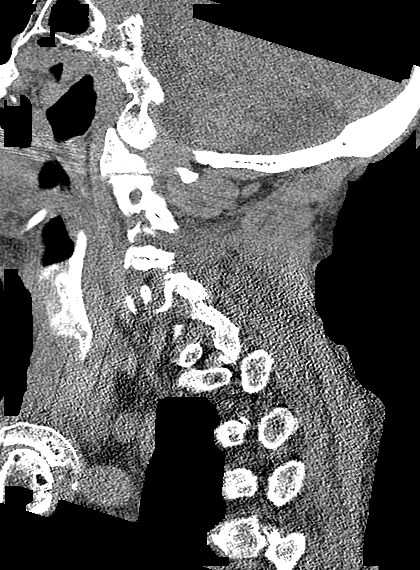
[im 37/74  bone]
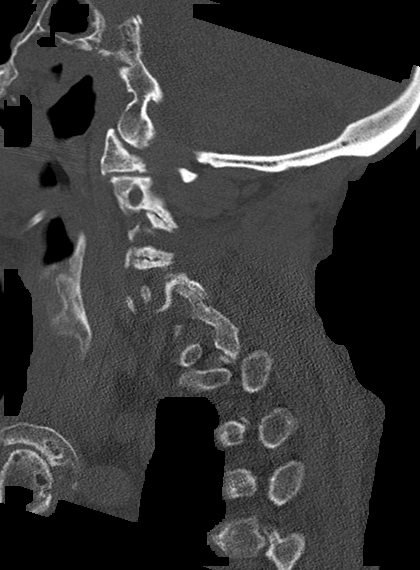
[im 43/74  bone]
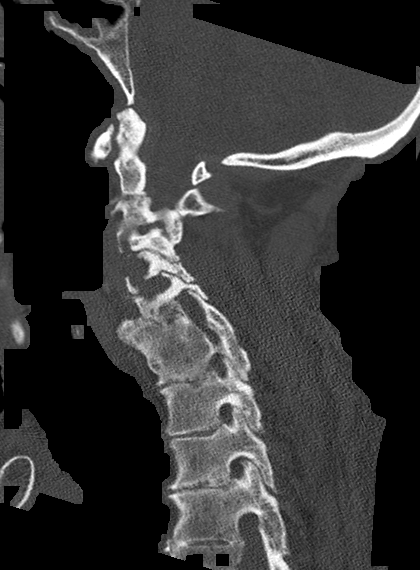
[im 49/74  bone]
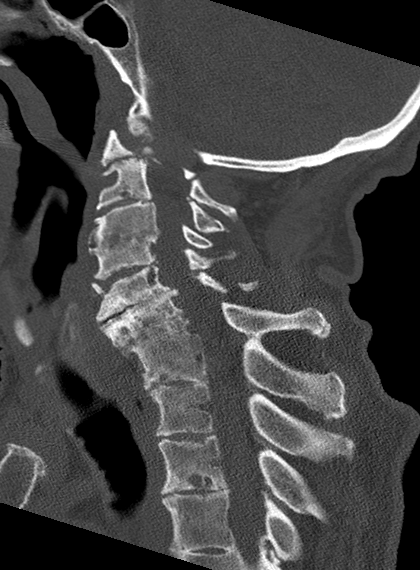

[Series 306: orthogonals, idose (2) · axial · 0.40mm/px · z∈[+129,+224]mm · 3 of 100 slices shown, 4 images]
[im 25/100  soft-tissue]
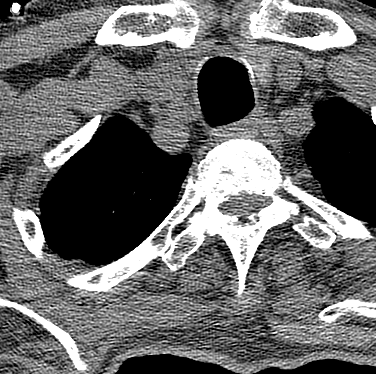
[im 25/100  bone]
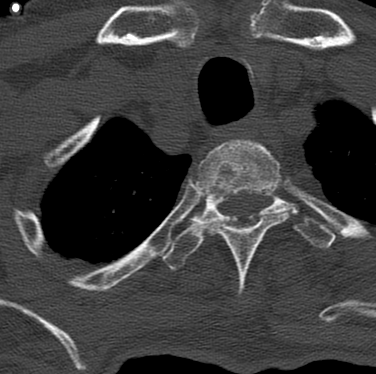
[im 50/100  bone]
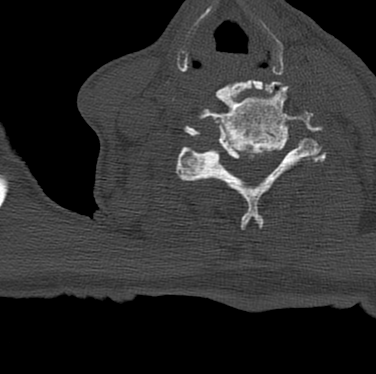
[im 75/100  bone]
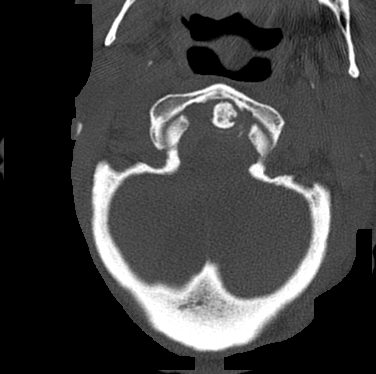

[14 of 33 positions shown; findings below may reference images not displayed]

FINDINGS: CT HEAD FINDINGS

There is dilatation of the ventricles out of proportion with the
sulci which may represent central volume loss versus normal pressure
hydrocephalus. Clinical correlation is recommended. Periventricular
and deep white matter hypodensities represent chronic microvascular
ischemic changes. There is no intracranial hemorrhage. No mass
effect or midline shift identified.

There is near complete opacification of the left maxillary sinus.
There is diffuse mucoperiosteal thickening of the paranasal sinuses
with partial opacification of the ethmoid air cells. The mastoid air
cells are well aerated. The calvarium is intact. Small right
forehead hematoma.

CT CERVICAL SPINE FINDINGS

There is no acute fracture or subluxation of the cervical
spine.There is osteopenia with extensive multilevel degenerative
changes of the spine. There is ankylosis of the C3 and C4 and
C6-C7.The odontoid and spinous processes are intact.There is normal
anatomic alignment of the C1-C2 lateral masses. The visualized soft
tissues appear unremarkable. A 1 cm left thyroid hypodense nodule.
IMPRESSION: No acute intracranial hemorrhage.

Age-related atrophy and chronic microvascular ischemic disease.

Extensive multilevel degenerative changes of the cervical spine. No
acute fracture.

## 2016-03-29 ENCOUNTER — Emergency Department (HOSPITAL_COMMUNITY): Payer: Medicare Other

## 2016-03-29 ENCOUNTER — Inpatient Hospital Stay (HOSPITAL_COMMUNITY): Payer: Medicare Other

## 2016-03-29 ENCOUNTER — Inpatient Hospital Stay (HOSPITAL_COMMUNITY)
Admission: EM | Admit: 2016-03-29 | Discharge: 2016-04-01 | DRG: 871 | Disposition: A | Discharge status: Home Health | Payer: Medicare Other | Attending: Internal Medicine | Admitting: Internal Medicine

## 2016-03-29 ENCOUNTER — Encounter (HOSPITAL_COMMUNITY): Payer: Self-pay

## 2016-03-29 DIAGNOSIS — E43 Unspecified severe protein-calorie malnutrition: Secondary | ICD-10-CM | POA: Diagnosis present

## 2016-03-29 DIAGNOSIS — I4891 Unspecified atrial fibrillation: Secondary | ICD-10-CM | POA: Diagnosis not present

## 2016-03-29 DIAGNOSIS — R6521 Severe sepsis with septic shock: Secondary | ICD-10-CM | POA: Diagnosis not present

## 2016-03-29 DIAGNOSIS — Z8249 Family history of ischemic heart disease and other diseases of the circulatory system: Secondary | ICD-10-CM

## 2016-03-29 DIAGNOSIS — E870 Hyperosmolality and hypernatremia: Secondary | ICD-10-CM | POA: Diagnosis not present

## 2016-03-29 DIAGNOSIS — Z7901 Long term (current) use of anticoagulants: Secondary | ICD-10-CM

## 2016-03-29 DIAGNOSIS — G9340 Encephalopathy, unspecified: Secondary | ICD-10-CM | POA: Diagnosis not present

## 2016-03-29 DIAGNOSIS — I10 Essential (primary) hypertension: Secondary | ICD-10-CM | POA: Diagnosis not present

## 2016-03-29 DIAGNOSIS — Z87440 Personal history of urinary (tract) infections: Secondary | ICD-10-CM | POA: Diagnosis not present

## 2016-03-29 DIAGNOSIS — E119 Type 2 diabetes mellitus without complications: Secondary | ICD-10-CM | POA: Diagnosis not present

## 2016-03-29 DIAGNOSIS — F0151 Vascular dementia with behavioral disturbance: Secondary | ICD-10-CM | POA: Diagnosis present

## 2016-03-29 DIAGNOSIS — F01518 Vascular dementia, unspecified severity, with other behavioral disturbance: Secondary | ICD-10-CM | POA: Diagnosis present

## 2016-03-29 DIAGNOSIS — E785 Hyperlipidemia, unspecified: Secondary | ICD-10-CM | POA: Diagnosis not present

## 2016-03-29 DIAGNOSIS — Z681 Body mass index (BMI) 19 or less, adult: Secondary | ICD-10-CM | POA: Diagnosis not present

## 2016-03-29 DIAGNOSIS — L89152 Pressure ulcer of sacral region, stage 2: Secondary | ICD-10-CM | POA: Diagnosis not present

## 2016-03-29 DIAGNOSIS — Z7982 Long term (current) use of aspirin: Secondary | ICD-10-CM | POA: Diagnosis not present

## 2016-03-29 DIAGNOSIS — Z833 Family history of diabetes mellitus: Secondary | ICD-10-CM | POA: Diagnosis not present

## 2016-03-29 DIAGNOSIS — J690 Pneumonitis due to inhalation of food and vomit: Secondary | ICD-10-CM | POA: Diagnosis not present

## 2016-03-29 DIAGNOSIS — Z8673 Personal history of transient ischemic attack (TIA), and cerebral infarction without residual deficits: Secondary | ICD-10-CM

## 2016-03-29 DIAGNOSIS — E876 Hypokalemia: Secondary | ICD-10-CM | POA: Diagnosis not present

## 2016-03-29 DIAGNOSIS — Z955 Presence of coronary angioplasty implant and graft: Secondary | ICD-10-CM

## 2016-03-29 DIAGNOSIS — R6520 Severe sepsis without septic shock: Secondary | ICD-10-CM | POA: Diagnosis present

## 2016-03-29 DIAGNOSIS — A419 Sepsis, unspecified organism: Principal | ICD-10-CM

## 2016-03-29 DIAGNOSIS — Z79899 Other long term (current) drug therapy: Secondary | ICD-10-CM

## 2016-03-29 DIAGNOSIS — I2510 Atherosclerotic heart disease of native coronary artery without angina pectoris: Secondary | ICD-10-CM | POA: Diagnosis not present

## 2016-03-29 DIAGNOSIS — E538 Deficiency of other specified B group vitamins: Secondary | ICD-10-CM | POA: Diagnosis not present

## 2016-03-29 DIAGNOSIS — Z86718 Personal history of other venous thrombosis and embolism: Secondary | ICD-10-CM | POA: Diagnosis not present

## 2016-03-29 DIAGNOSIS — R531 Weakness: Secondary | ICD-10-CM | POA: Diagnosis not present

## 2016-03-29 DIAGNOSIS — I6789 Other cerebrovascular disease: Secondary | ICD-10-CM | POA: Diagnosis not present

## 2016-03-29 DIAGNOSIS — R0902 Hypoxemia: Secondary | ICD-10-CM | POA: Diagnosis not present

## 2016-03-29 DIAGNOSIS — L8990 Pressure ulcer of unspecified site, unspecified stage: Secondary | ICD-10-CM | POA: Insufficient documentation

## 2016-03-29 DIAGNOSIS — R4182 Altered mental status, unspecified: Secondary | ICD-10-CM | POA: Diagnosis present

## 2016-03-29 DIAGNOSIS — Z87891 Personal history of nicotine dependence: Secondary | ICD-10-CM

## 2016-03-29 HISTORY — DX: Pneumonia, unspecified organism: J18.9

## 2016-03-29 HISTORY — DX: Unspecified convulsions: R56.9

## 2016-03-29 LAB — COMPREHENSIVE METABOLIC PANEL
ALBUMIN: 2.9 g/dL — AB (ref 3.5–5.0)
ALT: 10 U/L — AB (ref 17–63)
AST: 25 U/L (ref 15–41)
Alkaline Phosphatase: 82 U/L (ref 38–126)
Anion gap: 8 (ref 5–15)
BILIRUBIN TOTAL: 0.9 mg/dL (ref 0.3–1.2)
BUN: 8 mg/dL (ref 6–20)
CO2: 31 mmol/L (ref 22–32)
CREATININE: 0.9 mg/dL (ref 0.61–1.24)
Calcium: 8.7 mg/dL — ABNORMAL LOW (ref 8.9–10.3)
Chloride: 118 mmol/L — ABNORMAL HIGH (ref 101–111)
GFR calc Af Amer: >60 mL/min (ref >60)
GFR calc non Af Amer: >60 mL/min (ref >60)
GLUCOSE: 151 mg/dL — AB (ref 65–99)
Potassium: 2.6 mmol/L — CL (ref 3.5–5.1)
Sodium: 157 mmol/L — ABNORMAL HIGH (ref 135–145)
TOTAL PROTEIN: 8.4 g/dL — AB (ref 6.5–8.1)

## 2016-03-29 LAB — BASIC METABOLIC PANEL
ANION GAP: 11 (ref 5–15)
ANION GAP: 11 (ref 5–15)
ANION GAP: 9 (ref 5–15)
ANION GAP: 6 (ref 5–15)
BUN: 8 mg/dL (ref 6–20)
BUN: 7 mg/dL (ref 6–20)
BUN: 6 mg/dL (ref 6–20)
BUN: 6 mg/dL (ref 6–20)
CALCIUM: 8.8 mg/dL — AB (ref 8.9–10.3)
CALCIUM: 7.7 mg/dL — AB (ref 8.9–10.3)
CHLORIDE: 118 mmol/L — AB (ref 101–111)
CO2: 28 mmol/L (ref 22–32)
CO2: 23 mmol/L (ref 22–32)
CO2: 25 mmol/L (ref 22–32)
CO2: 25 mmol/L (ref 22–32)
Calcium: 7.6 mg/dL — ABNORMAL LOW (ref 8.9–10.3)
Calcium: 7.6 mg/dL — ABNORMAL LOW (ref 8.9–10.3)
Chloride: 120 mmol/L — ABNORMAL HIGH (ref 101–111)
Chloride: 123 mmol/L — ABNORMAL HIGH (ref 101–111)
Chloride: 123 mmol/L — ABNORMAL HIGH (ref 101–111)
Creatinine, Ser: 0.87 mg/dL (ref 0.61–1.24)
Creatinine, Ser: 0.73 mg/dL (ref 0.61–1.24)
Creatinine, Ser: 0.87 mg/dL (ref 0.61–1.24)
Creatinine, Ser: 0.74 mg/dL (ref 0.61–1.24)
GFR calc non Af Amer: >60 mL/min (ref >60)
GLUCOSE: 129 mg/dL — AB (ref 65–99)
Glucose, Bld: 182 mg/dL — ABNORMAL HIGH (ref 65–99)
Glucose, Bld: 151 mg/dL — ABNORMAL HIGH (ref 65–99)
Glucose, Bld: 133 mg/dL — ABNORMAL HIGH (ref 65–99)
POTASSIUM: 3.2 mmol/L — AB (ref 3.5–5.1)
Potassium: 3.1 mmol/L — ABNORMAL LOW (ref 3.5–5.1)
Potassium: 3.5 mmol/L (ref 3.5–5.1)
Potassium: 3.7 mmol/L (ref 3.5–5.1)
SODIUM: 154 mmol/L — AB (ref 135–145)
Sodium: 157 mmol/L — ABNORMAL HIGH (ref 135–145)
Sodium: 154 mmol/L — ABNORMAL HIGH (ref 135–145)
Sodium: 157 mmol/L — ABNORMAL HIGH (ref 135–145)

## 2016-03-29 LAB — URINALYSIS, ROUTINE W REFLEX MICROSCOPIC
BACTERIA UA: NONE SEEN
BILIRUBIN URINE: NEGATIVE
Glucose, UA: NEGATIVE mg/dL
KETONES UR: 5 mg/dL — AB
LEUKOCYTES UA: NEGATIVE
Nitrite: NEGATIVE
PH: 5 (ref 5.0–8.0)
PROTEIN: 100 mg/dL — AB
SQUAMOUS EPITHELIAL / LPF: NONE SEEN
Specific Gravity, Urine: 1.019 (ref 1.005–1.030)

## 2016-03-29 LAB — MAGNESIUM: Magnesium: 2.5 mg/dL — ABNORMAL HIGH (ref 1.7–2.4)

## 2016-03-29 LAB — CBC WITH DIFFERENTIAL/PLATELET
BASOS PCT: 0 %
Basophils Absolute: 0.1 10*3/uL (ref 0.0–0.1)
EOS ABS: 0.1 10*3/uL (ref 0.0–0.7)
Eosinophils Relative: 0 %
HEMATOCRIT: 40.7 % (ref 39.0–52.0)
HEMOGLOBIN: 13.1 g/dL (ref 13.0–17.0)
LYMPHS ABS: 3.5 10*3/uL (ref 0.7–4.0)
Lymphocytes Relative: 20 %
MCH: 28.5 pg (ref 26.0–34.0)
MCHC: 32.2 g/dL (ref 30.0–36.0)
MCV: 88.5 fL (ref 78.0–100.0)
MONO ABS: 1.3 10*3/uL — AB (ref 0.1–1.0)
MONOS PCT: 8 %
NEUTROS ABS: 12.1 10*3/uL — AB (ref 1.7–7.7)
Neutrophils Relative %: 72 %
Platelets: 250 10*3/uL (ref 150–400)
RBC: 4.6 MIL/uL (ref 4.22–5.81)
RDW: 17 % — AB (ref 11.5–15.5)
WBC: 17 10*3/uL — ABNORMAL HIGH (ref 4.0–10.5)

## 2016-03-29 LAB — I-STAT TROPONIN, ED: TROPONIN I, POC: 0.01 ng/mL (ref 0.00–0.08)

## 2016-03-29 LAB — PROTIME-INR
INR: 3.22
Prothrombin Time: 33.6 seconds — ABNORMAL HIGH (ref 11.4–15.2)

## 2016-03-29 LAB — LACTIC ACID, PLASMA
LACTIC ACID, VENOUS: 2 mmol/L — AB (ref 0.5–1.9)
LACTIC ACID, VENOUS: 1.9 mmol/L (ref 0.5–1.9)

## 2016-03-29 LAB — VALPROIC ACID LEVEL

## 2016-03-29 LAB — D-DIMER, QUANTITATIVE: D-Dimer, Quant: 1.83 ug/mL-FEU — ABNORMAL HIGH (ref 0.00–0.50)

## 2016-03-29 LAB — I-STAT CG4 LACTIC ACID, ED
LACTIC ACID, VENOUS: 2.04 mmol/L — AB (ref 0.5–1.9)
Lactic Acid, Venous: 3.95 mmol/L (ref 0.5–1.9)

## 2016-03-29 MED ORDER — WARFARIN - PHARMACIST DOSING INPATIENT: Freq: Every day | Status: DC

## 2016-03-29 MED ORDER — MEMANTINE HCL 5 MG PO TABS
5.0000 mg | ORAL_TABLET | Freq: Every day | ORAL | Status: DC
  Administered 2016-03-30 – 2016-04-01 (×3): 5 mg via ORAL
  Filled 2016-03-29 (×3): qty 1

## 2016-03-29 MED ORDER — ATORVASTATIN CALCIUM 40 MG PO TABS
40.0000 mg | ORAL_TABLET | Freq: Every day | ORAL | Status: DC
  Administered 2016-03-30 – 2016-04-01 (×3): 40 mg via ORAL
  Filled 2016-03-29 (×3): qty 1

## 2016-03-29 MED ORDER — DEXTROSE 5 % IV SOLN
100.0000 mg | Freq: Once | INTRAVENOUS | Status: DC
  Administered 2016-03-29: 100 mg via INTRAVENOUS
  Filled 2016-03-29: qty 100

## 2016-03-29 MED ORDER — DOXYCYCLINE HYCLATE 100 MG PO TABS: 100.0000 mg | ORAL_TABLET | Freq: Once | ORAL | Status: DC

## 2016-03-29 MED ORDER — ASPIRIN EC 81 MG PO TBEC
81.0000 mg | DELAYED_RELEASE_TABLET | Freq: Every day | ORAL | Status: DC
  Administered 2016-03-30 – 2016-04-01 (×3): 81 mg via ORAL
  Filled 2016-03-29 (×3): qty 1

## 2016-03-29 MED ORDER — SODIUM CHLORIDE 0.9 % IV SOLN
Freq: Once | INTRAVENOUS | Status: AC
  Administered 2016-03-29: 12:00:00 via INTRAVENOUS
  Filled 2016-03-29: qty 1000

## 2016-03-29 MED ORDER — POTASSIUM CHLORIDE IN NACL 40-0.9 MEQ/L-% IV SOLN: INTRAVENOUS | Status: DC

## 2016-03-29 MED ORDER — SODIUM CHLORIDE 0.9 % IV BOLUS (SEPSIS)
1000.0000 mL | Freq: Once | INTRAVENOUS | Status: AC
  Administered 2016-03-29: 1000 mL via INTRAVENOUS

## 2016-03-29 MED ORDER — IOPAMIDOL (ISOVUE-370) INJECTION 76%
INTRAVENOUS | Status: AC
  Administered 2016-03-29: 80 mL
  Filled 2016-03-29: qty 100

## 2016-03-29 MED ORDER — SODIUM CHLORIDE 0.9 % IV SOLN
3.0000 g | Freq: Four times a day (QID) | INTRAVENOUS | Status: DC
  Administered 2016-03-30 – 2016-04-01 (×9): 3 g via INTRAVENOUS
  Filled 2016-03-29 (×13): qty 3

## 2016-03-29 MED ORDER — DEXTROSE 5 % IV SOLN
1.0000 g | Freq: Once | INTRAVENOUS | Status: AC
  Administered 2016-03-29: 1 g via INTRAVENOUS
  Filled 2016-03-29: qty 10

## 2016-03-29 MED ORDER — ACETAMINOPHEN 325 MG PO TABS
650.0000 mg | ORAL_TABLET | Freq: Once | ORAL | Status: AC
  Administered 2016-03-29: 650 mg via ORAL
  Filled 2016-03-29: qty 2

## 2016-03-29 MED ORDER — DEXTROSE 5 % IV SOLN
500.0000 mg | INTRAVENOUS | Status: DC
  Administered 2016-03-29: 500 mg via INTRAVENOUS
  Filled 2016-03-29 (×2): qty 500

## 2016-03-29 MED ORDER — KCL IN DEXTROSE-NACL 40-5-0.45 MEQ/L-%-% IV SOLN
INTRAVENOUS | Status: AC
  Administered 2016-03-29: 22:00:00 via INTRAVENOUS
  Filled 2016-03-29: qty 1000

## 2016-03-29 MED ORDER — SODIUM CHLORIDE 0.9 % IV BOLUS (SEPSIS)
500.0000 mL | Freq: Once | INTRAVENOUS | Status: AC
  Administered 2016-03-29: 500 mL via INTRAVENOUS

## 2016-03-29 MED ORDER — DIVALPROEX SODIUM 125 MG PO CSDR
125.0000 mg | DELAYED_RELEASE_CAPSULE | Freq: Every day | ORAL | Status: DC
  Administered 2016-03-30 – 2016-03-31 (×2): 125 mg via ORAL
  Filled 2016-03-29 (×3): qty 1

## 2016-03-29 MED ORDER — LEVOTHYROXINE SODIUM 25 MCG PO TABS
25.0000 ug | ORAL_TABLET | Freq: Every day | ORAL | Status: DC
  Administered 2016-03-30 – 2016-04-01 (×3): 25 ug via ORAL
  Filled 2016-03-29 (×3): qty 1

## 2016-03-29 MED ORDER — DEXTROSE-NACL 5-0.45 % IV SOLN: INTRAVENOUS | Status: DC

## 2016-03-29 MED ORDER — DONEPEZIL HCL 10 MG PO TABS: 10.0000 mg | ORAL_TABLET | Freq: Every day | ORAL | Status: DC

## 2016-03-29 NOTE — ED Provider Notes (Signed)
[facility]-EMERGENCY DEPT Provider Note   CSN: [id number] Arrival date & time: 03/29/16  [id number]     History   Chief Complaint Chief Complaint  Patient presents with  . Fatigue    HPI Raymond AntisWillie Randal Jr. is a 82 y.o. male.  HPI Pt comes in with cc of AMS. LEVEL 5 CAVEAT FOR ALTERED MENTAL STATUS.  82 year old gentleman with a past medical history of dementia, CAD, DVT, and prior CVA who presents to the emergency department with a 2 day history of decreased responsiveness and shaking. Patient's wife provides history. She is a sole caretaker. She reports that her husband has been weak and less responsive the last 2 days. He also has had poor po intake.  Pt is noted to be febrile here. No cough, emesis, diarrhea per wife. PT has had uti in the past.   Past Medical History:  Diagnosis Date  . Arthritis   . CAD (coronary artery disease)    stent 2005  . Dementia   . DJD (degenerative joint disease)   . DVT (deep venous thrombosis) (HCC)    L leg 04-2011  . H/O ETOH abuse   . Hypercholesteremia   . Infectious colitis 08/07/2012  . Irregular heart beat   . Recurrent UTI    UTI, MS changes admited 02-2013  . Stroke [facility](HCC) 04-2011    Patient Active Problem List   Diagnosis Date Noted  . Hypernatremia 03/29/2016  . PCP NOTES >>>>>> 03/16/2015  . Right leg DVT (HCC) 03/16/2015  . Hypothyroidism 06/02/2014  . Weakness 05/09/2014  . Altered mental status 05/08/2014  . Vitamin B 12 deficiency 03/29/2013  . Anemia 02/14/2013  . Physical deconditioning 12/11/2012  . Protein-calorie malnutrition, severe (HCC) 11/26/2012  . Acute encephalopathy 11/24/2012  . Recurrent urinary tract infection 11/24/2012  . Unspecified protein-calorie malnutrition 11/24/2012  . Debility 09/07/2012  . Spasticity 09/07/2012  . Metabolic acidosis with normal anion gap and bicarbonate losses, also + Anion gap  08/07/2012  . Ataxia, late effect of cerebrovascular disease 07/06/2012  . Vascular dementia with  behavior disturbance 07/06/2012  . Homonymous hemianopsia due to recent stroke 05/31/2012  . CVA (cerebral infarction) 05/26/2012  . Diabetes mellitus, type 2 (HCC) 07/29/2011  . Right shoulder pain 05/02/2011  . CAD (coronary artery disease) 05/02/2011  . Hyperlipidemia 05/02/2011  . Dementia 05/02/2011    Past Surgical History:  Procedure Laterality Date  . left arm    . ROTATOR CUFF REPAIR  2000(L) 1999 (R)       Home Medications    Prior to Admission medications   Medication Sig Start Date End Date Taking? Authorizing Provider  aspirin EC 81 MG tablet Take 81 mg by mouth daily.   Yes Historical Provider, MD  atorvastatin (LIPITOR) 40 MG tablet Take 1 tablet (40 mg total) by mouth daily. 06/11/14  Yes Wanda PlumpJose E Paz, MD  cephALEXin (KEFLEX) 250 MG capsule Take 250 mg by mouth at bedtime.   Yes Historical Provider, MD  divalproex (DEPAKOTE SPRINKLE) 125 MG capsule TAKE 1 CAPSULE BY MOUTH EVERY MORNING, MAY TAKE 1CAPSULE IN THE EVENING AS NEEDED FOR AGITATION. Patient taking differently: TAKES 1 CAPSULE BY MOUTH TWICE DAILY 02/23/15  Yes Wanda PlumpJose E Paz, MD  donepezil (ARICEPT) 10 MG tablet Take 1 tablet (10 mg total) by mouth at bedtime. Patient taking differently: Take 10 mg by mouth 2 (two) times daily.  04/17/15  Yes Wanda PlumpJose E Paz, MD  levothyroxine (SYNTHROID, LEVOTHROID) 25 MCG tablet Take 1 tablet (25 mcg total)  by mouth daily before breakfast. 06/11/14  Yes Wanda PlumpJose E Paz, MD  memantine [facility](NAMENDA) 5 MG tablet Take 5 mg by mouth daily.   Yes Historical Provider, MD  warfarin (COUMADIN) 5 MG tablet Use as directed by [facility]. Patient taking differently: Take 5-7.5 mg by mouth See admin instructions. 5mg  on Sat, Sun - 7.5mg  M-F 05/12/15  Yes Wanda PlumpJose E Paz, MD  amLODipine (NORVASC) 5 MG tablet Take 1 tablet (5 mg total) by mouth daily. Patient not taking: Reported on 03/29/2016 02/23/15   Wanda PlumpJose E Paz, MD  baclofen (LIORESAL) 10 MG tablet Take 0.5 tablets (5 mg total) by mouth 2 (two) times  daily. Patient not taking: Reported on 03/29/2016 03/17/15   Wanda PlumpJose E Paz, MD  feeding supplement, GLUCERNA SHAKE, (GLUCERNA SHAKE) LIQD Take 237 mLs by mouth 3 (three) times daily between meals. Patient not taking: Reported on 03/29/2016 05/12/14   Alba CoryBelkys A Regalado, MD    Family History Family History  Problem Relation Age of Onset  . Heart disease Mother   . Diabetes Mother   . Heart disease Father   . Colon cancer Neg Hx   . Prostate cancer Neg Hx     Social History Social History  Substance Use Topics  . Smoking status: Former Smoker    Packs/day: 1.00    Years: 20.00    Types: Cigarettes    Quit date: 06/07/1958  . Smokeless tobacco: Never Used  . Alcohol use No     Comment: former abuse     Allergies   Patient has no known allergies.   Review of Systems Review of Systems   ROS 10 Systems reviewed and are negative for acute change except as noted in the HPI.   Marland Kitchen.  Physical Exam Updated Vital Signs BP 169/89   Pulse 96   Temp 101.8 F (38.8 C) (Rectal) Comment: Sender Rueb, MD present  Resp (!) 30   Ht 6' (1.829 m)   Wt 123 lb (55.8 kg)   SpO2 98%   BMI 16.68 kg/m   Physical Exam  Constitutional: He appears well-developed.  HENT:  Head: Normocephalic and atraumatic.  Eyes: Pupils are equal, round, and reactive to light.  Neck: Neck supple.  Cardiovascular: Normal rate, regular rhythm and normal heart sounds.   Pulmonary/Chest: Effort normal and breath sounds normal. No respiratory distress. He has no wheezes.  Abdominal: Soft. Bowel sounds are normal. He exhibits no distension. There is no tenderness. There is no rebound and no guarding.  Skin: Skin is warm. No rash noted.  Nursing note and vitals reviewed.    ED Treatments / Results  Labs (all labs ordered are listed, but only abnormal results are displayed) Labs Reviewed  COMPREHENSIVE METABOLIC PANEL - Abnormal; Notable for the following:       Result Value   Sodium 157 (*)    Potassium 2.6  (*)    Chloride 118 (*)    Glucose, Bld 151 (*)    Calcium 8.7 (*)    Total Protein 8.4 (*)    Albumin 2.9 (*)    ALT 10 (*)    All other components within normal limits  CBC WITH DIFFERENTIAL/PLATELET - Abnormal; Notable for the following:    WBC 17.0 (*)    RDW 17.0 (*)    Neutro Abs 12.1 (*)    Monocytes Absolute 1.3 (*)    All other components within normal limits  PROTIME-INR - Abnormal; Notable for the following:    Prothrombin Time 33.6 (*)  All other components within normal limits  URINALYSIS, ROUTINE W REFLEX MICROSCOPIC - Abnormal; Notable for the following:    Hgb urine dipstick SMALL (*)    Ketones, ur 5 (*)    Protein, ur 100 (*)    All other components within normal limits  VALPROIC ACID LEVEL - Abnormal; Notable for the following:    Valproic Acid Lvl <10 (*)    All other components within normal limits  D-DIMER, QUANTITATIVE (NOT AT [facility]) - Abnormal; Notable for the following:    D-Dimer, Quant 1.83 (*)    All other components within normal limits  MAGNESIUM - Abnormal; Notable for the following:    Magnesium 2.5 (*)    All other components within normal limits  I-STAT CG4 LACTIC ACID, ED - Abnormal; Notable for the following:    Lactic Acid, Venous 2.04 (*)    All other components within normal limits  CULTURE, BLOOD (ROUTINE X 2)  CULTURE, BLOOD (ROUTINE X 2)  URINE CULTURE  I-STAT TROPOININ, ED  I-STAT CG4 LACTIC ACID, ED    EKG  EKG Interpretation  Date/Time:  Tuesday March 29 2016 09:48:23 EST Ventricular Rate:  129 PR Interval:    QRS Duration: 139 QT Interval:  422 QTC Calculation: 619 R Axis:   -86 Text Interpretation:  Sinus tachycardia Right bundle branch block Inferior infarct, old  Nonspecific ST and T wave abnormality No significant change since last tracing Confirmed by Rhunette Croft, MD, Janey Genta [phone]) on 03/29/2016 10:43:44 AM       Radiology Ct Head Wo Contrast  Result Date: 03/29/2016 CLINICAL DATA:  Altered mental status.  EXAM: CT HEAD WITHOUT CONTRAST TECHNIQUE: Contiguous axial images were obtained from the base of the skull through the vertex without intravenous contrast. COMPARISON:  CT scan of April 01, 2015. FINDINGS: Brain: Mild diffuse cortical atrophy is noted. Mild chronic ischemic white matter disease is noted. Old left occipital infarction is noted. Dilatation of lateral ventricles is noted and stable, most likely due to surrounding atrophy. No mass effect or midline shift is noted. There is no evidence of hemorrhage, mass lesion or acute infarction. Vascular: Atherosclerosis of carotid siphons is noted. Skull: Normal. Negative for fracture or focal lesion. Sinuses/Orbits: Left maxillary sinusitis is noted. Other: None. IMPRESSION: Mild diffuse cortical atrophy. Mild chronic ischemic white matter disease. Old left occipital infarction. Left maxillary sinusitis. No acute intracranial abnormality seen. Electronically Signed   By: Lupita Raider, M.D.   On: 03/29/2016 10:46   Dg Chest Portable 1 View  Result Date: 03/29/2016 CLINICAL DATA:  Altered mental status EXAM: PORTABLE CHEST 1 VIEW COMPARISON:  01/05/2015 FINDINGS: The heart size and mediastinal contours are within normal limits. Both lungs are clear. The visualized skeletal structures are unremarkable. IMPRESSION: No active disease. Electronically Signed   By: Alcide Clever M.D.   On: 03/29/2016 10:34    Procedures .Critical Care Performed by: Derwood Kaplan Authorized by: Derwood Kaplan   Critical care provider statement:    Critical care time (minutes):  40   Critical care time was exclusive of:  Separately billable procedures and treating other patients   Critical care was necessary to treat or prevent imminent or life-threatening deterioration of the following conditions:  Dehydration, metabolic crisis and sepsis   Critical care was time spent personally by me on the following activities:  Blood draw for specimens, development of treatment  plan with patient or surrogate, discussions with consultants, evaluation of patient's response to treatment, examination of patient, obtaining history  from patient or surrogate, ordering and performing treatments and interventions, ordering and review of laboratory studies, ordering and review of radiographic studies, pulse oximetry, re-evaluation of patient's condition and review of old charts   (including critical care time)  Medications Ordered in ED Medications  sodium chloride 0.9 % bolus 1,000 mL (1,000 mLs Intravenous New Bag/Given 03/29/16 1141)  doxycycline (VIBRAMYCIN) 100 mg in dextrose 5 % 250 mL IVPB (not administered)  iopamidol (ISOVUE-370) 76 % injection (not administered)  cefTRIAXone (ROCEPHIN) 1 g in dextrose 5 % 50 mL IVPB (0 g Intravenous Stopped 03/29/16 1223)  sodium chloride 0.9 % 1,000 mL with potassium chloride 80 mEq infusion ( Intravenous Given 03/29/16 1211)     Initial Impression / Assessment and Plan / ED Course  I have reviewed the triage vital signs and the nursing notes.  Pertinent labs & imaging results that were available during my care of the patient were reviewed by me and considered in my medical decision making (see chart for details).  Clinical Course     DDx includes: ICH / Stroke ACS Sepsis syndrome Infection - UTI/Pneumonia/meningitis Electrolyte abnormality Drug overdose DKA Metabolic disorders including thyroid disorders, adrenal insufficiency  Pt comes in with cc of AMS. He is noted to be tachycardic, febrile. History is not consistent with any specific source. Pt is noted to be hypoxic - so aspiration pneumonia and PE are in the ddx. Pt has hx of DVT.  CT head ordered and is neg. Basic labs show elevated lactate and hyperNa. Pt has severe sepsis. - unknown source - but due to the low O2 sats, we think clinically, he likely has a pulmonary source. CXR is clear. Dimer was ordered, and is elevated - so CT PE ordered, which will give  further clarity.  Pt's Na is high. Which is likely the cause of AMS, along with severe sepsis. ? SIADH. We will let the admitting team look into the hypernatremia better. WE will get 0.9NS bolus.    Final Clinical Impressions(s) / ED Diagnoses   Final diagnoses:  Severe sepsis (HCC)  Acute hypernatremia  Acute hypokalemia    New Prescriptions New Prescriptions   No medications on file     Derwood Kaplan, MD 03/30/16 0109

## 2016-03-29 NOTE — ED Notes (Signed)
Delay in abx.-difficult to get blood cultures.

## 2016-03-29 NOTE — ED Notes (Signed)
Hannie, EMT assisted me with rectal temperature; visitor at bedside; Rhunette CroftNanavati, MD present in room

## 2016-03-29 NOTE — ED Notes (Signed)
Patient has returned from being out of the department at this time; patient placed back on monitor, continuous pulse oximetry, blood pressure cuff and oxygen [place] (2L); visitor at bedside

## 2016-03-29 NOTE — ED Notes (Signed)
Patient transported to CT 

## 2016-03-29 NOTE — ED Notes (Signed)
Myself and Hannie, EMT changed patient's diaper; placed on a clean dry on and repositioned patient on stretcher; visitor at bedside

## 2016-03-29 NOTE — ED Notes (Signed)
Clarified with provider that K+ to be hung after of K+ finishes.

## 2016-03-29 NOTE — H&P (Signed)
Date: 03/29/2016               Patient Name:  Raymond AntisWillie Cahalan Jr. MRN: [id number]  DOB: 05/03/1931 Age / Sex: 82 y.o., male   PCP: Pcp Not In System         Medical Service: Internal Medicine Teaching Service         Attending Physician: Dr. Gust RungErik C Hoffman, DO    First Contact: Dr. Thomasene LotJames Cruzito Standre Pager: [phone]  Second Contact: Dr. Reggie PileVasu Rathore Pager: [phone]       After Hours (After 5p/  First Contact Pager: [phone]  weekends / holidays): Second Contact Pager: [phone]   Chief Complaint: Decreased responsiveness  History of Present Illness: [name] is an 82 year old gentleman with a past medical history of severe progressive dementia, coronary artery disease, Atrial fibrillation on warfarin, DVT, arthritis and prior CVA who presents to the emergency department with a 2 day history of decreased responsiveness and shaking. The history was obtained from the patient's son as the patient did not answer questions and was not interactive on the examination. The son states that his father usually lies in bed and is minimally responsive at baseline. He is unable to do any of his activities of daily living. His neurological decline is secondary to severe progressive dementia. He has a home health nurse who sees the patient twice daily. The patient was in his usual state of health until approximately 2 days ago when he became less responsive and began having some shaking. The patient's son stated that this happened in the past and in these scenarios the patient ended up having a urinary tract infection. The patient's son denies that his father has had any vomiting. He has not had a fever at home. The patient is dually incontinent and his son does not think he has had any diarrhea recently. Talking with the patient this afternoon he said he was not in any pain but did not answer any additional questions. He was having intermittent shaking that appeared to look like chills and not seizure activity.  In  the emergency department the patient was febrile to 101.8, tachycardic with a max heart rate of 125, tachypneic with respiratory rate max of 30 and borderline hypertensive. Labs were significant for hypernatremia with a sodium of 157 and hypokalemia with potassium of 2.6. CBC demonstrated a leukocytosis with a white cell count of 17. Lactic acid was elevated at 2.04. D-dimer was ordered and was elevated at 1.83. CT angiography chest to rule out pulmonary embolism was negative for pulmonary embolism. However, this imaging study demonstrated a bronchogram in the right lower lobe posterior which is suspicious for pneumonia. Given the location aspiration pneumonia cannot be excluded. Blood and urine cultures were obtained. The patient was given a 2.5 L normal saline bolus. [facility] for further workup, management and evaluation.  Meds:  Current Meds  Medication Sig  . aspirin EC 81 MG tablet Take 81 mg by mouth daily.  Marland Kitchen. atorvastatin (LIPITOR) 40 MG tablet Take 1 tablet (40 mg total) by mouth daily.  . cephALEXin (KEFLEX) 250 MG capsule Take 250 mg by mouth at bedtime.  . divalproex (DEPAKOTE SPRINKLE) 125 MG capsule TAKE 1 CAPSULE BY MOUTH EVERY MORNING, MAY TAKE 1CAPSULE IN THE EVENING AS NEEDED FOR AGITATION. (Patient taking differently: TAKES 1 CAPSULE BY MOUTH TWICE DAILY)  . donepezil (ARICEPT) 10 MG tablet Take 1 tablet (10 mg total) by  mouth at bedtime. (Patient taking differently: Take 10 mg by mouth 2 (two) times daily. )  . levothyroxine (SYNTHROID, LEVOTHROID) 25 MCG tablet Take 1 tablet (25 mcg total) by mouth daily before breakfast.  . memantine (NAMENDA) 5 MG tablet Take 5 mg by mouth daily.  Marland Kitchen warfarin (COUMADIN) 5 MG tablet Use as directed by [facility]. (Patient taking differently: Take 5-7.5 mg by mouth See admin instructions. 5mg  on Sat, Sun - 7.5mg  M-F)     Allergies: Allergies as of 03/29/2016  . (No Known Allergies)     Past Medical History:  Diagnosis Date  . Arthritis   . CAD (coronary artery disease)    stent 2005  . Dementia   . DJD (degenerative joint disease)   . DVT (deep venous thrombosis) (HCC)    L leg 04-2011  . H/O ETOH abuse   . Hypercholesteremia   . Infectious colitis 08/07/2012  . Irregular heart beat   . Recurrent UTI    UTI, MS changes admited 02-2013  . Stroke [facility]) 04-2011    Family History:  Family History  Problem Relation Age of Onset  . Heart disease Mother   . Diabetes Mother   . Heart disease Father   . Colon cancer Neg Hx   . Prostate cancer Neg Hx       Review of Systems: A complete ROS was negative except as per HPI.   Physical Exam: Blood pressure 138/85, pulse 105, temperature 101.8 F (38.8 C), temperature source Rectal, resp. rate 26, height 6' (1.829 m), weight 123 lb (55.8 kg), SpO2 97 %. Physical Exam  Constitutional:  Thin, intermittent shaking that looked like chills  HENT:  Head: Normocephalic and atraumatic.  Nasal cannula in place  Cardiovascular: Regular rhythm.  Exam reveals no friction rub.   No murmur heard. Tachycardic  Respiratory:  Increased rate of breathing, mild right basilar crackles appreciated  GI: Soft. Bowel sounds are normal. He exhibits no distension. There is no tenderness.  Musculoskeletal: He exhibits no edema.  Neurological:  Not responsive to questioning, pupils reactive to light. Does open eyes on command.     EKG: Sinus tachycardia, right bundle branch block  CXR: No acute cardiopulmonary abnormality  Assessment & Plan by Problem: Active Problems:   Hypernatremia  [name] is an 82 year old gentleman with progressive dementia who is minimally verbal and unable to perform his activities of daily living at baseline who presents with decreased responsiveness and shortness of breath with a CT scan concerning for pneumonia and vitals consistent with sepsis.  # Sepsis most likely secondary to pneumonia At  the time of presentation the patient was febrile, tachycardic and tachypneic. He also has a leukocytosis with a white blood cell count greater than 12,000 and elevated lactic acid. CT angiography of the chest demonstrates an air bronchogram in the right medial posterior lung lobe concerning for pneumonia or aspiration pneumonia. The exact etiology of the patient's underlying infection is not certain at this time however given the radiographic evidence pneumonia would be the most likely source of infection. Also, given the patient's baseline mental status I suspect underlying swallowing dysfunction leading to aspiration pneumonia. As such we will include anaerobic coverage with antimicrobial therapy. -- Ampicillin-sulbactam every 6 hours for anaerobic coverage for potential aspiration pneumonia -- Azithromycin -- Follow-up blood culture -- Follow-up urine culture -- Follow-up Legionella urine antigen -- Follow-up strep pneumo urine antigen -- Trend lactic acid  # Hypernatremia The patient's sodium at the time of presentation  was 157. Overall, he has received approximately 2.5 L of fluid in the emergency department. We will slowly correct the patient's sodium to avoid cerebral edema associated with large fluid shifts within the CNS. We will not lower the patient's sodium by greater than 10 in a 24-hour period. The most likely etiology of the patient's hypernatremia is secondary to hypovolemia and I would expect improvement with IV hydration. -- Goal sodium over the next 24 hours no less than 147 -- D% 1/2NS + 40 mEq K at 75cc/hr for 12 hours -- BMP every 6 hours  # Hypokalemia The patient's potassium at the time of presentation was 2.6. He was given 80 mEq potassium intravenously in the emergency department. On his most recent repeat basic metabolic panel was potassium was 3.2. We will continue to trend to monitor his sodium and make adjustments to his potassium level as necessary. -- BMP every 6  hours  # Progressive dementia The patient has progressive dementia at baseline and is unable to perform his activities of daily living. According to his son he is minimally verbal at home. His dementia has been progressive over the previous 2 years. Also I suspect underlying swallowing dysfunction leading to potential aspiration pneumonia. The patient will benefit from an inpatient evaluation to assess his swallowing function. -- Continue donepezil 10 mg once daily -- Continue memantine 5 mg once daily -- SLP consult  # Atrial fibrillation # DVT of the right saphenofemoral junction, right common femoral vein Patient has a history of atrial fibrillation and a DVT. Vascular ultrasound from 05/04/2015 showed a right saphenofemoral junction and right common femoral vein DVT. He is currently anticoagulated for his atrial fibrillation on warfarin. His INR at the time of presentation was 3.22. -- Warfarin per pharmacy  # Hypertension Patient has a diagnosis of hypertension and takes amlodipine 5 mg once daily at home. We will hold amlodipine in the setting of sepsis. -- Hold home amlodipine.  DVT/PE prophylaxis: Anticoagulated with warfarin secondary to DVT FEN/GI: Nothing by mouth Code: Full Code  Dispo: Admit patient to Inpatient with expected length of stay greater than 2 midnights.  Signed: Thomasene LotJames Tahiri Shareef, MD 03/29/2016, 2:47 PM  Pager: [phone]

## 2016-03-29 NOTE — ED Notes (Signed)
Patient undressed, in gown, on monitor, continuous pulse oximetry and blood pressure cuff 

## 2016-03-29 NOTE — ED Triage Notes (Signed)
Pt. Coming from home via [facility] for altered mental status. Pt. Family says he was last normal at 830 pm. Family states he is just a lot more lethargic than normal. Pt. Hx of stroke with residual left-sided weakness and only able to answer yes or no. EMS reports abnormal EKG, temp of 101.7, RR of 27. EMS placed nasal air way  En route and placed pt. On NRB and then given 2G mag and 5mg  albuterol. Pt. Noted to have sacral wound. Pt. Is on antibiotoic at home, but family does not know why.

## 2016-03-29 NOTE — ED Notes (Signed)
Pt. Oxygen dropped to 77% on room air. Pt. Placed to NRB mask. EDP made aware. Oxygen saturation slowly returned to 96% on NRB.

## 2016-03-29 NOTE — Progress Notes (Signed)
ANTICOAGULATION CONSULT NOTE - Initial Consult  Pharmacy Consult for Warfarin Indication: history of DVT  No Known Allergies  Patient Measurements: Height: 6' (182.9 cm) Weight: 123 lb (55.8 kg) IBW/kg (Calculated) : 77.6  Vital Signs: Temp: 98.7 F (37.1 C) (12/19 1512) Temp Source: Oral (12/19 1512) BP: 141/94 (12/19 1500) Pulse Rate: 112 (12/19 1500)  Labs:  Recent Labs  03/29/16 0955 03/29/16 1227  HGB 13.1  --   HCT 40.7  --   PLT 250  --   LABPROT 33.6*  --   INR 3.22  --   CREATININE 0.90 0.87    Estimated Creatinine Clearance: 51.7 mL/min (by C-G formula based on SCr of 0.87 mg/dL).   Medical History: Past Medical History:  Diagnosis Date  . Arthritis   . CAD (coronary artery disease)    stent 2005  . Dementia   . DJD (degenerative joint disease)   . DVT (deep venous thrombosis) (HCC)    L leg 04-2011  . H/O ETOH abuse   . Hypercholesteremia   . Infectious colitis 08/07/2012  . Irregular heart beat   . Recurrent UTI    UTI, MS changes admited 02-2013  . Stroke [facility](HCC) 04-2011    Medications:   (Not in a hospital admission) Scheduled:  . aspirin EC  81 mg Oral Daily  . atorvastatin  40 mg Oral Daily  . donepezil  10 mg Oral QHS  . [START ON 03/30/2016] levothyroxine  25 mcg Oral QAC breakfast  . memantine  5 mg Oral Daily   Infusions:  . [START ON 03/30/2016] ampicillin-sulbactam (UNASYN) IV      Assessment: 82yo male with history of DVT on warfarin presents with Pharmacy is consulted to dose warfarin. INR is slightly elevated on presentation. Will give partial dose to keep from dropping too low.  PTA Warfarin Dose: 5mg  Sat/Sun and 7.5mg  AODs with last dose 12/18  Goal of Therapy:  INR 2-3 Monitor platelets by anticoagulation protocol: Yes   Plan:  Warfarin 2.5mg  tonight x1 Daily INR Monitor s/sx of bleeding  Arlean Hoppingorey M. Newman PiesBall, PharmD, BCPS Clinical Pharmacist Pager [phone] 03/29/2016,3:17 PM

## 2016-03-29 NOTE — Progress Notes (Signed)
Pharmacy Antibiotic Note  Raymond AntisWillie Petzold Jr. is a 82 y.o. male admitted on 03/29/2016 with aspiration PNA.  Pharmacy has been consulted for Unasyn dosing. Tmax/24h 101.8, WBC 17, CrCl~50.  Pt received 1x dose of ceftriaxone at 1141 and doxycycline at 1317.  Plan: Unasyn 3g IV q6h Monitor clinical progress, c/s, renal function, LOT  Height: 6' (182.9 cm) Weight: 123 lb (55.8 kg) IBW/kg (Calculated) : 77.6  Temp (24hrs), Avg:101.8 F (38.8 C), Min:101.8 F (38.8 C), Max:101.8 F (38.8 C)   Recent Labs Lab 03/29/16 0955 03/29/16 1004 03/29/16 1239  WBC 17.0*  --   --   CREATININE 0.90  --   --   LATICACIDVEN  --  2.04* 3.95*    Estimated Creatinine Clearance: 49.9 mL/min (by C-G formula based on SCr of 0.9 mg/dL).    No Known Allergies  Babs BertinHaley Kaylan Yates, PharmD, BCPS Clinical Pharmacist 03/29/2016 1:45 PM

## 2016-03-30 DIAGNOSIS — I82411 Acute embolism and thrombosis of right femoral vein: Secondary | ICD-10-CM

## 2016-03-30 DIAGNOSIS — G9340 Encephalopathy, unspecified: Secondary | ICD-10-CM

## 2016-03-30 DIAGNOSIS — F0151 Vascular dementia with behavioral disturbance: Secondary | ICD-10-CM

## 2016-03-30 DIAGNOSIS — I10 Essential (primary) hypertension: Secondary | ICD-10-CM

## 2016-03-30 DIAGNOSIS — J690 Pneumonitis due to inhalation of food and vomit: Secondary | ICD-10-CM

## 2016-03-30 DIAGNOSIS — A419 Sepsis, unspecified organism: Principal | ICD-10-CM

## 2016-03-30 DIAGNOSIS — E119 Type 2 diabetes mellitus without complications: Secondary | ICD-10-CM

## 2016-03-30 DIAGNOSIS — Z7901 Long term (current) use of anticoagulants: Secondary | ICD-10-CM

## 2016-03-30 DIAGNOSIS — E870 Hyperosmolality and hypernatremia: Secondary | ICD-10-CM

## 2016-03-30 DIAGNOSIS — L8990 Pressure ulcer of unspecified site, unspecified stage: Secondary | ICD-10-CM | POA: Insufficient documentation

## 2016-03-30 DIAGNOSIS — Z833 Family history of diabetes mellitus: Secondary | ICD-10-CM

## 2016-03-30 DIAGNOSIS — I69918 Other symptoms and signs involving cognitive functions following unspecified cerebrovascular disease: Secondary | ICD-10-CM

## 2016-03-30 DIAGNOSIS — I4891 Unspecified atrial fibrillation: Secondary | ICD-10-CM

## 2016-03-30 DIAGNOSIS — Z8249 Family history of ischemic heart disease and other diseases of the circulatory system: Secondary | ICD-10-CM

## 2016-03-30 DIAGNOSIS — I2510 Atherosclerotic heart disease of native coronary artery without angina pectoris: Secondary | ICD-10-CM

## 2016-03-30 LAB — BASIC METABOLIC PANEL
Anion gap: 6 (ref 5–15)
Anion gap: 4 — ABNORMAL LOW (ref 5–15)
Anion gap: 8 (ref 5–15)
BUN: 6 mg/dL (ref 6–20)
BUN: 6 mg/dL (ref 6–20)
BUN: 7 mg/dL (ref 6–20)
CALCIUM: 7.9 mg/dL — AB (ref 8.9–10.3)
CALCIUM: 8.3 mg/dL — AB (ref 8.9–10.3)
CHLORIDE: 124 mmol/L — AB (ref 101–111)
CHLORIDE: 119 mmol/L — AB (ref 101–111)
CO2: 24 mmol/L (ref 22–32)
CO2: 24 mmol/L (ref 22–32)
CO2: 23 mmol/L (ref 22–32)
CREATININE: 0.72 mg/dL (ref 0.61–1.24)
CREATININE: 0.74 mg/dL (ref 0.61–1.24)
CREATININE: 0.81 mg/dL (ref 0.61–1.24)
Calcium: 7.8 mg/dL — ABNORMAL LOW (ref 8.9–10.3)
Chloride: 125 mmol/L — ABNORMAL HIGH (ref 101–111)
GFR calc Af Amer: >60 mL/min (ref >60)
GFR calc non Af Amer: >60 mL/min (ref >60)
GFR calc non Af Amer: >60 mL/min (ref >60)
Glucose, Bld: 141 mg/dL — ABNORMAL HIGH (ref 65–99)
Glucose, Bld: 121 mg/dL — ABNORMAL HIGH (ref 65–99)
Glucose, Bld: 113 mg/dL — ABNORMAL HIGH (ref 65–99)
POTASSIUM: 3.7 mmol/L (ref 3.5–5.1)
Potassium: 3.9 mmol/L (ref 3.5–5.1)
Potassium: 3.6 mmol/L (ref 3.5–5.1)
SODIUM: 154 mmol/L — AB (ref 135–145)
SODIUM: 153 mmol/L — AB (ref 135–145)
Sodium: 150 mmol/L — ABNORMAL HIGH (ref 135–145)

## 2016-03-30 LAB — PROTIME-INR
INR: 3.93
PROTHROMBIN TIME: 46.2 s — AB (ref 11.4–15.2)

## 2016-03-30 LAB — MAGNESIUM: Magnesium: 2.3 mg/dL (ref 1.7–2.4)

## 2016-03-30 LAB — CBC
HCT: 33.4 % — ABNORMAL LOW (ref 39.0–52.0)
HEMOGLOBIN: 10.6 g/dL — AB (ref 13.0–17.0)
MCH: 28.1 pg (ref 26.0–34.0)
MCHC: 31.7 g/dL (ref 30.0–36.0)
MCV: 88.6 fL (ref 78.0–100.0)
PLATELETS: 197 10*3/uL (ref 150–400)
RBC: 3.77 MIL/uL — AB (ref 4.22–5.81)
RDW: 17.2 % — ABNORMAL HIGH (ref 11.5–15.5)
WBC: 13.4 10*3/uL — ABNORMAL HIGH (ref 4.0–10.5)

## 2016-03-30 LAB — URINE CULTURE: Culture: NO GROWTH

## 2016-03-30 LAB — STREP PNEUMONIAE URINARY ANTIGEN: Strep Pneumo Urinary Antigen: NEGATIVE

## 2016-03-30 MED ORDER — DEXTROSE 5 % IV SOLN
INTRAVENOUS | Status: AC
  Administered 2016-03-30 – 2016-03-31 (×4): via INTRAVENOUS

## 2016-03-30 MED ORDER — DONEPEZIL HCL 10 MG PO TABS
10.0000 mg | ORAL_TABLET | Freq: Two times a day (BID) | ORAL | Status: DC
  Administered 2016-03-30 – 2016-04-01 (×5): 10 mg via ORAL
  Filled 2016-03-30 (×5): qty 1

## 2016-03-30 MED ORDER — KCL IN DEXTROSE-NACL 40-5-0.45 MEQ/L-%-% IV SOLN
INTRAVENOUS | Status: DC
  Administered 2016-03-30: 10:00:00 via INTRAVENOUS
  Filled 2016-03-30: qty 1000

## 2016-03-30 NOTE — H&P (Signed)
Internal Medicine Attending Admission Note  I saw and evaluated the patient. I reviewed the resident's note and I agree with the resident's findings and plan as documented in the resident's note.  Assessment & Plan by Problem:   Sepsis 2/2 Aspiration pneumonia (HCC) - Agree with Unasyn, clinical picture and CTA is c/w aspiration PNA. - Appreciate SLP eval, he will remain at risk for aspiration due to his dementia however will try to modify risk factors as available.    Diet Controlled Diabetes mellitus, type 2 (HCC) - Monitor CBG with D5 administration, add SSI if needed    Vascular dementia with behavior disturbance - Resume home meds when tolerating PO  Acute encephalopathy due to sepsis - 2/2 sepsis and hypernatremia, we are treating his underlying medical problems    Hypernatremia - Was treated with very gentile free water replacement and did not budge much.  Suspect the reason this did not correct more was 2/2 salt load from unasyn as well as increased insensible loss 2/2 infection.  Will be more aggressive with free water replacement with D5W 100cc/hr and check BMP BID.  Will slow rate if correcting too quickly.    Pressure injury of skin/ Stage 2 Sacral ulcer - Appreciate nursing assessment and care  Chief Complaint(s):AMS  History - key components related to admission:  Briefly Raymond AntisWillie Pratt Jr. is an 82 year old male with a past medical history of severe dementia, coronary artery disease, A. fib, previous CVA, diabetes who presents with a 2 day history of decreased responsiveness and some shaking. Per family his baseline status is minimally responsive and he is unable to do any of his activities of daily living however he is able to live at home with care from family and home health nurse. However over the last 2 days he has been even less responsive. From past experience the son has felt this was related to urinary tract infections. And thus brought him into the ED for evaluation.  On my evaluation Raymond Rangel has no complaints and he does respond to questions. He is not oriented. And per his son he appears to be approaching his baseline.   Lab results: Reviewed in Epic  Physical Exam - key components related to admission: General: elderly male resting in bed HEENT:  EOMI, no scleral icterus, dry mucus membranes Cardiac: RRR, no rubs, murmurs or gallops Pulm: clear to auscultation bilaterally Abd: soft, nontender, nondistended, BS present Ext: warm and well perfused, no pedal edema, 2x2cm stage 2 sacral wound, foam dressing applied. Neuro: alert and oriented X1  Vitals:   03/29/16 1530 03/29/16 1545 03/29/16 1627 03/30/16 0803  BP: 129/84 124/85 131/83 131/87  Pulse: 99 99 94 93  Resp: 21 20 19 18   Temp:   98.4 F (36.9 C) 98.2 F (36.8 C)  TempSrc:   Axillary Axillary  SpO2: 96% 98% 100% 99%  Weight:      Height:

## 2016-03-30 NOTE — Evaluation (Signed)
Clinical/Bedside Swallow Evaluation Patient Details  Name: Raymond AntisWillie Chavarin Jr. MRN: [id number] Date of Birth: 10/17/1931  Today's Date: 03/30/2016 Time: SLP Start Time (ACUTE ONLY): 1000 SLP Stop Time (ACUTE ONLY): 1017 SLP Time Calculation (min) (ACUTE ONLY): 17 min  Past Medical History:  Past Medical History:  Diagnosis Date  . Arthritis   . CAD (coronary artery disease)    stent 2005  . Dementia   . DJD (degenerative joint disease)   . DVT (deep venous thrombosis) (HCC)    L leg 04-2011  . H/O ETOH abuse   . Hypercholesteremia   . Infectious colitis 08/07/2012  . Irregular heart beat   . Pneumonia   . Recurrent UTI    UTI, MS changes admited 02-2013  . Seizures (HCC)   . Stroke [facility](HCC) 04-2011   Past Surgical History:  Past Surgical History:  Procedure Laterality Date  . JOINT REPLACEMENT    . left arm    . ROTATOR CUFF REPAIR  2000(L) 1999 (R)   HPI:  Mr. [name] is an 82 year old gentleman with a past medical history of severe progressive dementia, coronary artery disease, Atrial fibrillation on warfarin, DVT, arthritis and prior CVA who presents to the emergency department with a 2 day history of decreased responsiveness and shaking. Typically pt lies in bed and is minimally responsive at baseline. CT scan concerning for pneumonia and vitals consistent with sepsis. CT shows peribronchial thickening in right lower lobe and streaky infiltrate in right lower posterior lobe. Pt has a history of dysphagia, had a Modified Barium Swallow in 2014 that showed delayed swallow, impulsive self feeding, residuals due to pressure on UES by cervical osteophytes, no penetration or aspiration.    Assessment / Plan / Recommendation Clinical Impression  Pt presents with severe dementia, though ability to consume solids and liquids with total assist feeding remains intact. This pt does not suffer from typical dementia related swallow impairment; he is immediately responsive to tactile cues with total  assist feeing and accepts bites and sips immediately with timely oral transit. His intake is rather rapid however, with large consecutive straw sips taken if allowed. Pt demonstrates no signs of aspiration with trials, however, clinician does suspect delayed swallow and ongoing impact of cervical osteophytes as seen on prior MBS.   Despite relatively good response to feeding, pt is at high risk of aspiration given need for total assist feeding bedbound status and risk of aspiration of oropharyngeal flora. There are no interventions that will eliminate this risk; thickening liquids would still likely be aspirated intermittently and worsen impact on the lungs. Best practice is to follow aspiration precautions, instruct caregivers on careful feeding techniques and provide regular oral care. Called pts son who was very open to education and we discussed positioning, careful feeding and oral care after meals. He verbalized understanding of risk of aspiration and was not suprised by this discussion. He reports that pts wife typically feeds him pureed foods, so we will continue this diet here; recommend dys 1 (puree) with thin liquids, pills crushed. No f/u needed as education complete.     Aspiration Risk  Moderate aspiration risk    Diet Recommendation Dysphagia 1 (Puree);Thin liquid   Liquid Administration via: Cup;Straw Medication Administration: Crushed with puree Supervision: Staff to assist with self feeding;Full supervision/cueing for compensatory strategies Compensations: Slow rate;Small sips/bites Postural Changes: Seated upright at 90 degrees;Remain upright for at least 30 minutes after po intake    Other  Recommendations Oral Care Recommendations: Oral care before  and after PO   Follow up Recommendations None      Frequency and Duration            Prognosis        Swallow Study   General HPI: Mr. [name] is an [age] year old gentleman with a past medical history of severe progressive  dementia, coronary artery disease, Atrial fibrillation on warfarin, DVT, arthritis and prior CVA who presents to the emergency department with a 2 day history of decreased responsiveness and shaking. Typically pt lies in bed and is minimally responsive at baseline. CT scan concerning for pneumonia and vitals consistent with sepsis. CT shows peribronchial thickening in right lower lobe and streaky infiltrate in right lower posterior lobe. Pt has a history of dysphagia, had a Modified Barium Swallow in 2014 that showed delayed swallow, impulsive self feeding, residuals due to pressure on UES by cervical osteophytes, no penetration or aspiration.  Type of Study: Bedside Swallow Evaluation Previous Swallow Assessment: see HPI Diet Prior to this Study: NPO Temperature Spikes Noted: No Respiratory Status: Nasal cannula History of Recent Intubation: No Behavior/Cognition: Alert;Cooperative;Requires cueing Oral Care Completed by SLP: No Oral Cavity - Dentition: Poor condition;Missing dentition Vision: Impaired for self-feeding Self-Feeding Abilities: Total assist Patient Positioning: Upright in bed Baseline Vocal Quality: Normal Volitional Cough: Cognitively unable to elicit Volitional Swallow: Unable to elicit    Oral/Motor/Sensory Function Overall Oral Motor/Sensory Function: Within functional limits   Ice Chips Ice chips: Within functional limits Presentation: Spoon   Thin Liquid Thin Liquid: Within functional limits Presentation: Cup;Straw    Nectar Thick Nectar Thick Liquid: Not tested   Honey Thick Honey Thick Liquid: Not tested   Puree Puree: Within functional limits Presentation: Spoon   Solid   GO   Solid: Within functional limits        Neola Worrall, Riley NearingBonnie Caroline 03/30/2016,10:33 AM

## 2016-03-30 NOTE — Evaluation (Signed)
Physical Therapy Evaluation Patient Details Name: Raymond AntisWillie Selmer Jr. MRN: [id number] DOB: 02/24/1932 Today's Date: 03/30/2016   History of Present Illness  [name] is an 82 year old gentleman with a past medical history of severe progressive dementia, coronary artery disease, Atrial fibrillation on warfarin, DVT, arthritis and prior CVA who presents to the emergency department with a 2 day history of decreased responsiveness and shaking.  Clinical Impression  Pt not able to participate therapeutically in physical therapy.  He will benefit from family/PCA having him sit EOB and up in the chair, but likely would not regain ability to assist well at any point.  Will sign off at this time.    Follow Up Recommendations No PT follow up    Equipment Recommendations  None recommended by PT    Recommendations for Other Services       Precautions / Restrictions Precautions Precautions: Fall Restrictions Weight Bearing Restrictions: No      Mobility  Bed Mobility Overal bed mobility: Needs Assistance Bed Mobility: Supine to Sit;Sit to Supine     Supine to sit: Total assist Sit to supine: Total assist   General bed mobility comments: pt with no initiation  Transfers Overall transfer level: Needs assistance   Transfers: Sit to/from Stand Sit to Stand: Total assist         General transfer comment: pt leans forward with cues.  Once up starts to accept more weight L LE  Ambulation/Gait             General Gait Details: NA  Stairs            Wheelchair Mobility    Modified Rankin (Stroke Patients Only)       Balance Overall balance assessment: Needs assistance   Sitting balance-Leahy Scale: Poor Sitting balance - Comments: drifts posteriorly, unable to maintain midline or come forward from reclined position.     Standing balance-Leahy Scale: Zero                               Pertinent Vitals/Pain Pain Assessment: Faces Faces Pain Scale:  No hurt    Home Living Family/patient expects to be discharged to:: Private residence Living Arrangements: Spouse/significant other Available Help at Discharge: Family;Personal care attendant;Available 24 hours/day (PCA in am and pm) Type of Home: House Home Access: Ramped entrance     Home Layout: One level Home Equipment: Wheelchair - manual      Prior Function Level of Independence: Needs assistance   Gait / Transfers Assistance Needed: Up to total assist with transfers  ADL's / Homemaking Assistance Needed: dependent with ADL's        Hand Dominance        Extremity/Trunk Assessment   Upper Extremity Assessment Upper Extremity Assessment: Defer to OT evaluation (L side brunstrom 1 held in flexion, fingers fisted)    Lower Extremity Assessment Lower Extremity Assessment: RLE deficits/detail;LLE deficits/detail RLE Deficits / Details: R paretic.  trace voluntary movement, increased tone. LLE Deficits / Details: grossly 3/5 LLE Coordination: decreased fine motor    Cervical / Trunk Assessment Cervical / Trunk Assessment: Kyphotic  Communication      Cognition Arousal/Alertness: Awake/alert Behavior During Therapy: Flat affect Overall Cognitive Status: History of cognitive impairments - at baseline                      General Comments      Exercises  Assessment/Plan    PT Assessment Patent does not need any further PT services  PT Problem List            PT Treatment Interventions      PT Goals (Current goals can be found in the Care Plan section)  Acute Rehab PT Goals Patient Stated Goal: unable PT Goal Formulation: With patient/family    Frequency     Barriers to discharge        Co-evaluation               End of Session   Activity Tolerance: Patient limited by fatigue;Other (comment) (pt unable to focus on therapy.) Patient left: in bed;with call bell/phone within reach;with family/visitor present Nurse  Communication: Mobility status         Time: [id number] PT Time Calculation (min) (ACUTE ONLY): 32 min   Charges:   PT Evaluation $PT Eval Moderate Complexity: 1 Procedure PT Treatments $Therapeutic Activity: 8-22 mins   PT G CodesEliseo Rangel:        Raymond Rangel 03/30/2016, 6:49 PM  03/30/2016  Raymond BingKen Itai Rangel, PT [phone]  (pager)

## 2016-03-30 NOTE — Progress Notes (Signed)
Subjective: Patient more interactive on examination today. States he is not in any pain. States that he is feeling better. Is able to say yes or no to most questions asked. Overall, he looks better and improved since starting antibiotics for pneumonia.  Objective:  Vital signs in last 24 hours: Vitals:   03/29/16 1530 03/29/16 1545 03/29/16 1627 03/30/16 0803  BP: 129/84 124/85 131/83 131/87  Pulse: 99 99 94 93  Resp: 21 20 19 18   Temp:   98.4 F (36.9 C) 98.2 F (36.8 C)  TempSrc:   Axillary Axillary  SpO2: 96% 98% 100% 99%  Weight:      Height:       Physical Exam  Constitutional: He appears well-developed and well-nourished.  HENT:  Head: Normocephalic and atraumatic.  Cardiovascular: Normal rate and regular rhythm.  Exam reveals no gallop and no friction rub.   No murmur heard. Respiratory: Effort normal and breath sounds normal. No respiratory distress. He has no wheezes.  GI: Soft. Bowel sounds are normal. He exhibits no distension. There is no tenderness.  Neurological:  Patient more interactive on exam. Is able to answer yes or no questions. Was not oriented to place.     Assessment/Plan:  Active Problems:   CAD (coronary artery disease)   Diabetes mellitus, type 2 (HCC)   Vascular dementia with behavior disturbance   Protein-calorie malnutrition, severe (HCC)   Vitamin B 12 deficiency   Altered mental status   Hypernatremia   Sepsis (HCC)   Aspiration pneumonia (HCC)   Pressure injury of skin  Raymond Rangel is an 82 year old gentleman with progressive dementia who is minimally verbal and unable to perform his activities of daily living at baseline who presents with decreased responsiveness and shortness of breath with a CT scan concerning for pneumonia and vitals consistent with sepsis.  #Sepsis secondary to aspiration pneumonia Patient remains afebrile and hemodynamically stable. He is no longer tachycardic on exam. He is more interactive today. Based on the  CT findings and presentation the most likely etiology of the patient's sepsis was secondary to aspiration pneumonia. We will continue to treat with coverage for anaerobes. -- d/c azithromycin, do not expect atypical's  -- Ampicillin sulbactam, we'll transition to by mouth Augmentin at the time of discharge. -- Follow-up blood culture- no growth to date -- Follow-up urine culture- no growth to date -- Follow-up Legionella urine antigen -- Follow-up strep pneumo urine antigen -- Trend lactic acid- normalized  # Hypernatremia The patient's sodium at the time of presentation was 157. His most recent sodium was 153. His goal sodium over the next 24 hours will be between 143 and 145. His free water deficit is approximately 2 L. We'll make adjustments to his IV fluids and continue to monitor. -- Goal sodium over the next 24 hours no less than 143 -- D5W at 100 mL per hour -- BMP every 12 hours  # Progressive dementia The patient has progressive dementia at baseline and is unable to perform his activities of daily living. According to his son he is minimally verbal at home. His dementia has been progressive over the previous 2 years. Also I suspect underlying swallowing dysfunction leading to potential aspiration pneumonia. The patient will benefit from an inpatient evaluation to assess his swallowing function. -- Continue donepezil 10 mg once daily -- Continue memantine 5 mg once daily -- SLP consult  # Atrial fibrillation # DVT of the right saphenofemoral junction, right common femoral vein Patient has a history  of atrial fibrillation and a DVT. Vascular ultrasound from 05/04/2015 showed a right saphenofemoral junction and right common femoral vein DVT. He is currently anticoagulated for his atrial fibrillation on warfarin. His INR at the time of presentation was 3.22. Most recent INR of 3.93. Goal INR 2-3.  -- Warfarin per pharmacy  # Hypertension Patient has a diagnosis of hypertension and  takes amlodipine 5 mg once daily at home. We will hold amlodipine in the setting of sepsis. -- Hold home amlodipine.  DVT/PE prophylaxis: Anticoagulated with warfarin secondary to DVT FEN/GI: Nothing by mouth Code: Full Code    Dispo: Anticipated discharge tomorrow.   Raymond LotJames Ashwini Jago, MD 03/30/2016, 11:12 AM Pager: [phone]

## 2016-03-30 NOTE — Progress Notes (Addendum)
ANTICOAGULATION CONSULT NOTE - Follow-Up Consult  Pharmacy Consult for Warfarin Indication: history of DVT/AFib  No Known Allergies  Patient Measurements: Height: 6' (182.9 cm) Weight: 123 lb (55.8 kg) IBW/kg (Calculated) : 77.6  Vital Signs: Temp: 98.2 F (36.8 C) (12/20 0803) Temp Source: Axillary (12/20 0803) BP: 131/87 (12/20 0803) Pulse Rate: 93 (12/20 0803)  Labs:  Recent Labs  03/29/16 0955  03/29/16 1711 03/29/16 2110 03/30/16 0345  HGB 13.1  --   --   --  10.6*  HCT 40.7  --   --   --  33.4*  PLT 250  --   --   --  197  LABPROT 33.6*  --   --   --  46.2*  INR 3.22  --   --   --  3.93  CREATININE 0.90  < > 0.87 0.74 0.72  < > = values in this interval not displayed.  Estimated Creatinine Clearance: 56.2 mL/min (by C-G formula based on SCr of 0.72 mg/dL).   Medical History: Past Medical History:  Diagnosis Date  . Arthritis   . CAD (coronary artery disease)    stent 2005  . Dementia   . DJD (degenerative joint disease)   . DVT (deep venous thrombosis) (HCC)    L leg 04-2011  . H/O ETOH abuse   . Hypercholesteremia   . Infectious colitis 08/07/2012  . Irregular heart beat   . Pneumonia   . Recurrent UTI    UTI, MS changes admited 02-2013  . Seizures (HCC)   . Stroke [facility](HCC) 04-2011    Medications:  Prescriptions Prior to Admission  Medication Sig Dispense Refill Last Dose  . aspirin EC 81 MG tablet Take 81 mg by mouth daily.   03/28/2016 at Unknown time  . atorvastatin (LIPITOR) 40 MG tablet Take 1 tablet (40 mg total) by mouth daily. 90 tablet 2 03/28/2016 at Unknown time  . cephALEXin (KEFLEX) 250 MG capsule Take 250 mg by mouth at bedtime.   03/28/2016 at Unknown time  . divalproex (DEPAKOTE SPRINKLE) 125 MG capsule TAKE 1 CAPSULE BY MOUTH EVERY MORNING, MAY TAKE 1CAPSULE IN THE EVENING AS NEEDED FOR AGITATION. (Patient taking differently: TAKES 1 CAPSULE BY MOUTH TWICE DAILY) 60 capsule 0 03/28/2016 at Unknown time  . donepezil (ARICEPT) 10 MG  tablet Take 1 tablet (10 mg total) by mouth at bedtime. (Patient taking differently: Take 10 mg by mouth 2 (two) times daily. ) 30 tablet 0 03/28/2016 at Unknown time  . levothyroxine (SYNTHROID, LEVOTHROID) 25 MCG tablet Take 1 tablet (25 mcg total) by mouth daily before breakfast. 90 tablet 2 03/28/2016 at Unknown time  . memantine (NAMENDA) 5 MG tablet Take 5 mg by mouth daily.   03/28/2016 at Unknown time  . warfarin (COUMADIN) 5 MG tablet Use as directed by [facility]. (Patient taking differently: Take 5-7.5 mg by mouth See admin instructions. 5mg  on Sat, Sun - 7.5mg  M-F) 30 tablet 0 03/28/2016 at 1800  . amLODipine (NORVASC) 5 MG tablet Take 1 tablet (5 mg total) by mouth daily. (Patient not taking: Reported on 03/29/2016) 30 tablet 0 Not Taking at Unknown time  . baclofen (LIORESAL) 10 MG tablet Take 0.5 tablets (5 mg total) by mouth 2 (two) times daily. (Patient not taking: Reported on 03/29/2016) 30 tablet 0 Not Taking at Unknown time  . feeding supplement, GLUCERNA SHAKE, (GLUCERNA SHAKE) LIQD Take 237 mLs by mouth 3 (three) times daily between meals. (Patient not taking: Reported on 03/29/2016) 30 Can  0 Not Taking at Unknown time   Scheduled:  . ampicillin-sulbactam (UNASYN) IV  3 g Intravenous Q6H  . aspirin EC  81 mg Oral Daily  . atorvastatin  40 mg Oral Daily  . azithromycin  500 mg Intravenous Q24H  . divalproex  125 mg Oral QHS  . donepezil  10 mg Oral QHS  . levothyroxine  25 mcg Oral QAC breakfast  . memantine  5 mg Oral Daily  . Warfarin - Pharmacist Dosing Inpatient   Does not apply q1800   Infusions:  . dextrose 5 % and 0.45 % NaCl with KCl 40 mEq/L 125 mL/hr at 03/30/16 0530    Assessment: 84 yoM w/ AFib and hx of DVT on warfarin PTA presents with aspiration PNA. INR on admit was supratherapeutic at 3.22, now up to 3.93 s/p reduced dose last night. Hgb down to 10.6, plt wnl but down from admit. No S/Sx bleeding noted.  PTA Warfarin Dose: 7.5mg  daily except 5mg   Sat/Sun  Goal of Therapy:  INR 2-3 Monitor platelets by anticoagulation protocol: Yes   Plan:  -Hold warfarin tonight -Daily INR -Monitor s/sx of bleeding closely  Fredonia HighlandMichael Bitonti, PharmD PGY-1 Pharmacy Resident Pager: [phone] 03/30/2016

## 2016-03-31 LAB — BASIC METABOLIC PANEL
Anion gap: 4 — ABNORMAL LOW (ref 5–15)
Anion gap: 8 (ref 5–15)
BUN: 7 mg/dL (ref 6–20)
BUN: 6 mg/dL (ref 6–20)
CHLORIDE: 117 mmol/L — AB (ref 101–111)
CHLORIDE: 111 mmol/L (ref 101–111)
CO2: 27 mmol/L (ref 22–32)
CO2: 23 mmol/L (ref 22–32)
CREATININE: 0.72 mg/dL (ref 0.61–1.24)
Calcium: 7.8 mg/dL — ABNORMAL LOW (ref 8.9–10.3)
Calcium: 7.8 mg/dL — ABNORMAL LOW (ref 8.9–10.3)
Creatinine, Ser: 0.72 mg/dL (ref 0.61–1.24)
GFR calc non Af Amer: >60 mL/min (ref >60)
GFR calc non Af Amer: >60 mL/min (ref >60)
Glucose, Bld: 91 mg/dL (ref 65–99)
Glucose, Bld: 119 mg/dL — ABNORMAL HIGH (ref 65–99)
POTASSIUM: 3.4 mmol/L — AB (ref 3.5–5.1)
Potassium: 3.2 mmol/L — ABNORMAL LOW (ref 3.5–5.1)
SODIUM: 148 mmol/L — AB (ref 135–145)
SODIUM: 142 mmol/L (ref 135–145)

## 2016-03-31 LAB — PROTIME-INR
INR: 3.54
Prothrombin Time: 36.3 seconds — ABNORMAL HIGH (ref 11.4–15.2)

## 2016-03-31 LAB — LEGIONELLA PNEUMOPHILA SEROGP 1 UR AG: L. pneumophila Serogp 1 Ur Ag: NEGATIVE

## 2016-03-31 MED ORDER — POTASSIUM CHLORIDE CRYS ER 20 MEQ PO TBCR
30.0000 meq | EXTENDED_RELEASE_TABLET | Freq: Once | ORAL | Status: AC
  Administered 2016-03-31: 30 meq via ORAL
  Filled 2016-03-31: qty 1

## 2016-03-31 MED ORDER — ENSURE ENLIVE PO LIQD
237.0000 mL | Freq: Two times a day (BID) | ORAL | Status: DC
  Administered 2016-04-01: 237 mL via ORAL

## 2016-03-31 MED ORDER — POTASSIUM CHLORIDE CRYS ER 20 MEQ PO TBCR: 40.0000 meq | EXTENDED_RELEASE_TABLET | Freq: Once | ORAL | Status: DC

## 2016-03-31 NOTE — Progress Notes (Addendum)
ANTICOAGULATION CONSULT NOTE - Follow-Up Consult  Pharmacy Consult for Warfarin Indication: history of DVT/AFib  No Known Allergies  Patient Measurements: Height: 6' (182.9 cm) Weight: 123 lb 14.4 oz (56.2 kg) IBW/kg (Calculated) : 77.6  Vital Signs: Temp: 98.5 F (36.9 C) (12/21 0524) Temp Source: Oral (12/21 0524) BP: 136/85 (12/21 0524) Pulse Rate: 88 (12/21 0524)  Labs:  Recent Labs  03/29/16 0955  03/30/16 0345 03/30/16 0918 03/30/16 1843 03/31/16 0631  HGB 13.1  --  10.6*  --   --   --   HCT 40.7  --  33.4*  --   --   --   PLT 250  --  197  --   --   --   LABPROT 33.6*  --  46.2*  --   --  36.3*  INR 3.22  --  3.93  --   --  3.54  CREATININE 0.90  < > 0.72 0.74 0.81 0.72  < > = values in this interval not displayed.  Estimated Creatinine Clearance: 56.6 mL/min (by C-G formula based on SCr of 0.72 mg/dL).   Medical History: Past Medical History:  Diagnosis Date  . Arthritis   . CAD (coronary artery disease)    stent 2005  . Dementia   . DJD (degenerative joint disease)   . DVT (deep venous thrombosis) (HCC)    L leg 04-2011  . H/O ETOH abuse   . Hypercholesteremia   . Infectious colitis 08/07/2012  . Irregular heart beat   . Pneumonia   . Recurrent UTI    UTI, MS changes admited 02-2013  . Seizures (HCC)   . Stroke [facility](HCC) 04-2011    Medications:  Prescriptions Prior to Admission  Medication Sig Dispense Refill Last Dose  . aspirin EC 81 MG tablet Take 81 mg by mouth daily.   03/28/2016 at Unknown time  . atorvastatin (LIPITOR) 40 MG tablet Take 1 tablet (40 mg total) by mouth daily. 90 tablet 2 03/28/2016 at Unknown time  . cephALEXin (KEFLEX) 250 MG capsule Take 250 mg by mouth at bedtime.   03/28/2016 at Unknown time  . divalproex (DEPAKOTE SPRINKLE) 125 MG capsule TAKE 1 CAPSULE BY MOUTH EVERY MORNING, MAY TAKE 1CAPSULE IN THE EVENING AS NEEDED FOR AGITATION. (Patient taking differently: TAKES 1 CAPSULE BY MOUTH TWICE DAILY) 60 capsule 0  03/28/2016 at Unknown time  . donepezil (ARICEPT) 10 MG tablet Take 1 tablet (10 mg total) by mouth at bedtime. (Patient taking differently: Take 10 mg by mouth 2 (two) times daily. ) 30 tablet 0 03/28/2016 at Unknown time  . levothyroxine (SYNTHROID, LEVOTHROID) 25 MCG tablet Take 1 tablet (25 mcg total) by mouth daily before breakfast. 90 tablet 2 03/28/2016 at Unknown time  . memantine (NAMENDA) 5 MG tablet Take 5 mg by mouth daily.   03/28/2016 at Unknown time  . warfarin (COUMADIN) 5 MG tablet Use as directed by [facility]. (Patient taking differently: Take 5-7.5 mg by mouth See admin instructions. 5mg  on Sat, Sun - 7.5mg  M-F) 30 tablet 0 03/28/2016 at 1800  . amLODipine (NORVASC) 5 MG tablet Take 1 tablet (5 mg total) by mouth daily. (Patient not taking: Reported on 03/29/2016) 30 tablet 0 Not Taking at Unknown time  . baclofen (LIORESAL) 10 MG tablet Take 0.5 tablets (5 mg total) by mouth 2 (two) times daily. (Patient not taking: Reported on 03/29/2016) 30 tablet 0 Not Taking at Unknown time  . feeding supplement, GLUCERNA SHAKE, (GLUCERNA SHAKE) LIQD Take 237 mLs  by mouth 3 (three) times daily between meals. (Patient not taking: Reported on 03/29/2016) 30 Can 0 Not Taking at Unknown time   Scheduled:  . ampicillin-sulbactam (UNASYN) IV  3 g Intravenous Q6H  . aspirin EC  81 mg Oral Daily  . atorvastatin  40 mg Oral Daily  . divalproex  125 mg Oral QHS  . donepezil  10 mg Oral BID  . levothyroxine  25 mcg Oral QAC breakfast  . memantine  5 mg Oral Daily  . potassium chloride  30 mEq Oral Once  . Warfarin - Pharmacist Dosing Inpatient   Does not apply q1800   Infusions:  . dextrose 100 mL/hr at 03/31/16 0210    Assessment: [age] yoM w/ AFib and hx of DVT on warfarin PTA presents with aspiration PNA. INR on admit was supratherapeutic at 3.22 and trended up to 3.93 on 12/20, now trending down but remains supratherapeutic at 3.54 possibly due to underlying infection. Hgb down to 10.6,  plt wnl but down from admit. No S/Sx bleeding noted. Per MD notes, likely to be discharged today.  PTA Warfarin Dose: 7.5mg  daily except 5mg  Sat/Sun  Goal of Therapy:  INR 2-3 Monitor platelets by anticoagulation protocol: Yes   Plan:  -Hold warfarin tonight  -Monitor INR and s/sx of bleeding daily while admitted -If discharged today - recommend holding warfarin again tomorrow 12/22 and resuming home regimen Saturday 12/23 with close INR follow-up (Tuesday 12/26)  Fredonia HighlandMichael Dominga Mcduffie, PharmD PGY-1 Pharmacy Resident Pager: [phone] 03/31/2016

## 2016-03-31 NOTE — Discharge Summary (Signed)
Name: Raymond AntisWillie Slone Jr. MRN: [id number] DOB: 10/06/1931 82 y.o. PCP: Pcp Not In System  Date of Admission: 03/29/2016  9:37 AM Date of Discharge: 04/01/2016 Attending Physician: Gust RungErik C Hoffman, DO  Discharge Diagnosis: 1. Sepsis secondary to aspiration pneumonia 2. Hypernatremia 3. Medication changes Principal Problem:   Aspiration pneumonia (HCC) Active Problems:   CAD (coronary artery disease)   Diabetes mellitus, type 2 (HCC)   Vascular dementia with behavior disturbance   Protein-calorie malnutrition, severe (HCC)   Vitamin B 12 deficiency   Altered mental status   Hypernatremia   Sepsis (HCC)   Pressure injury of skin   Discharge Medications: Allergies as of 04/01/2016   No Known Allergies     Medication List    STOP taking these medications   cephALEXin 250 MG capsule Commonly known as:  KEFLEX     TAKE these medications   amLODipine 5 MG tablet Commonly known as:  NORVASC Take 1 tablet (5 mg total) by mouth daily.   amoxicillin-clavulanate 875-125 MG tablet Commonly known as:  AUGMENTIN Take 1 tablet by mouth 2 (two) times daily. Start taking on:  04/02/2016   aspirin EC 81 MG tablet Take 81 mg by mouth daily.   atorvastatin 40 MG tablet Commonly known as:  LIPITOR Take 1 tablet (40 mg total) by mouth daily.   baclofen 10 MG tablet Commonly known as:  LIORESAL Take 0.5 tablets (5 mg total) by mouth 2 (two) times daily.   divalproex 125 MG capsule Commonly known as:  DEPAKOTE SPRINKLE Take 1 capsule (125 mg total) by mouth at bedtime. What changed:  See the new instructions.   donepezil 10 MG tablet Commonly known as:  ARICEPT Take 1 tablet (10 mg total) by mouth at bedtime. What changed:  when to take this   feeding supplement (GLUCERNA SHAKE) Liqd Take 237 mLs by mouth 3 (three) times daily between meals.   levothyroxine 25 MCG tablet Commonly known as:  SYNTHROID, LEVOTHROID Take 1 tablet (25 mcg total) by mouth daily before  breakfast.   memantine 5 MG tablet Commonly known as:  NAMENDA Take 5 mg by mouth daily.   warfarin 5 MG tablet Commonly known as:  COUMADIN Use as directed by [facility]. What changed:  how much to take  how to take this  when to take this  additional instructions       Disposition and follow-up:   Raymond Geraldine ContrasMcCoy Jr. was discharged from [facility] in Good condition.  At the hospital follow up visit please address:  1.  Please ensure the patient remains orally hydrated and drinks plenty of water.Please ensure he has finished taking his antibiotic for pneumonia.  2.  Labs / imaging needed at time of follow-up: None  3.  Pending labs/ test needing follow-up: None  Follow-up Appointments:   Hospital Course by problem list: Principal Problem:   Aspiration pneumonia (HCC) Active Problems:   CAD (coronary artery disease)   Diabetes mellitus, type 2 (HCC)   Vascular dementia with behavior disturbance   Protein-calorie malnutrition, severe (HCC)   Vitamin B 12 deficiency   Altered mental status   Hypernatremia   Sepsis (HCC)   Pressure injury of skin   1. Sepsis secondary to aspiration pneumonia The patient presented to the [facility] emergency department on 10/28/2015 with decreased responsiveness and shaking. At the time of presentation the patient was not even answering yes or no questions. He was not interactive and all the history was  obtained from the patient's son. In the emergency department the patient was found to be febrile to 101.8, tachycardic and tachypneic. A d-dimer was ordered which was elevated and a CT chest with angiography followed up this elevated lab. CT of the chest showed a right lower lobe pneumonia consistent with aspiration. There is then admitted for sepsis secondary to aspiration pneumonia. Once admitted the patient was started on IV ampicillin sulbactam. This antibiotic was continued the patient clinically improved. On  the day of discharge the patient was answering yes or no questions, participating with physical examination and was much more alert and oriented. He'll be transitioned to oral Augmentin to complete a total of 7 days with his antibiotic end date of 04/06/2016. After admission the patient quickly defervesced and remained afebrile and hemodynamically stable on service. On the day of discharge he was afebrile and much more interactive on examination.   2. Hypernatremia At the time of presentation the patient's sodium was elevated 157. His hypernatremia was thought to be secondary to hypovolemia in the setting of poor by mouth intake and dehydration. He was started on aggressive IV hydration with D5W and his sodium gradually improved over the next 48 hours. We were careful not to drop the patient's sodium too low and targeted 8-10 millimoles per 24-hour period. As the patient's sodium improved, his pneumonia was treated he is mental status also began to improve. I do suspect that the patient's hypernatremia was also contributing to his decreased mental status and responsiveness at the time of his presentation. Speaking with his son  the patient is much more close to his baseline. He has severe progressive dementia and often times is not drinking enough fluids at home. I stressed the importance of having good by mouth intake and drinking plenty of free water at home to prevent dehydration and hypernatremia in the future.  3. Medication changes At the time of presentation the patient was taking Depakote twice daily in addition to other medications. We have reduced his dose of Depakote to once daily. He has no seizure history and per the family this was started for agitation secondary to his dementia. While off the second dose of Depakote he seems much more responsive and verbal with the family. If he becomes combative or aggressive in the future you may consider restarting Depakote to twice daily. However, at this  time I think  treating the patient with a single dose of Depakote would be appropriate and that is our recommendation at discharge. Discharge Vitals:   BP (!) 133/59 (BP Location: Right Arm)   Pulse 74   Temp 98.5 F (36.9 C) (Oral)   Resp 16   Ht 6' (1.829 m)   Wt 122 lb (55.3 kg)   SpO2 100%   BMI 16.55 kg/m   Pertinent Labs, Studies, and Procedures:  1. CT angiography-no pulmonary embolism, right lower lobe pneumonia consistent with aspiration 2. CT head without contrast-no acute intracranial abnormality  Discharge Instructions: Discharge Instructions    Diet - low sodium heart healthy    Complete by:  As directed    Discharge instructions    Complete by:  As directed    We have treated you for a pneumonia.  I am prescribing you an antibiotic to continue to take when you get home to treat your pneumonia. It is called Augmentin. You're to take 2 pills a day for 4 days. A schedule would be take 1 pill at breakfast tomorrow and 1 pill at dinner. Please  take the medication this way until all pills are gone. Your last day of antibiotic should be on 04/06/2016.  You were also very dehydrated when you came to the hospital. Please ensure that he gets plenty of water every day. Regular water would be best. Again, he was very dehydrated when he came to the hospital. Please ensure he stays extremely well hydrated at home.  Lastly, I have decreased his dose of Depakote. He is only to take this medication in the evening before bedtime.   Increase activity slowly    Complete by:  As directed       Signed: Thomasene Lot, MD 04/01/2016, 10:49 AM   Pager: [phone]

## 2016-03-31 NOTE — Progress Notes (Signed)
Initial Nutrition Assessment  DOCUMENTATION CODES:   Severe malnutrition in context of chronic illness, Underweight  INTERVENTION:  Recommend continuation of nutritional supplements at home to aid in caloric and protein needs.   NUTRITION DIAGNOSIS:   Malnutrition (severe) related to chronic illness as evidenced by severe depletion of body fat, severe depletion of muscle mass.  GOAL:   Patient will meet greater than or equal to 90% of their needs  MONITOR:   PO intake, Supplement acceptance, Labs, Weight trends, Skin, I & O's  REASON FOR ASSESSMENT:   Low Braden    ASSESSMENT:   82 year old gentleman with progressive dementia who is minimally verbal and unable to perform his activities of daily living at baseline who presents with decreased responsiveness and shortness of breath with a CT scan concerning for pneumonia and vitals consistent with sepsis.   Meal completion has been 40-70% with 65% intake at lunch today. Pt reports having a good appetite currently and PTA with usual consumption of at least 2-3 meals a day with Ensure shakes 1-2 times daily. Weight has been stable. RD to order Ensure to aid in caloric and protein needs.   Per MD note, if Na WNL this afternoon, pt can be discharged today. IV fluids are running.  Nutrition-Focused physical exam completed. Findings are severe fat depletion, severe muscle depletion, and no edema.   Labs and medications reviewed. Sodium elevated at 148.   Diet Order:  DIET - DYS 1 Room service appropriate? Yes; Fluid consistency: Thin  Skin:  Wound (see comment) (Stage II on buttocks)  Last BM:  12/20  Height:   Ht Readings from Last 1 Encounters:  03/29/16 6' (1.829 m)    Weight:   Wt Readings from Last 1 Encounters:  03/30/16 123 lb 14.4 oz (56.2 kg)    Ideal Body Weight:  80.9 kg  BMI:  Body mass index is 16.8 kg/m.  Estimated Nutritional Needs:   Kcal:  1700-1900  Protein:  80-90 grams  Fluid:  1.7 - 1.9  L/day  EDUCATION NEEDS:   No education needs identified at this time  Roslyn SmilingStephanie Fredrico Beedle, MS, RD, LDN Pager # [phone] After hours/ weekend pager # [phone]

## 2016-03-31 NOTE — Consult Note (Signed)
   [facility] CM Inpatient Consult   03/31/2016  Raymond Rangel. 1932-04-03 [id number]  Referral received for assessment of community follow up. Spoke with inpatient RNCM regarding the patient's needs.  Patient is from home and bedbound. Chart review reveals the patient is an 82 yo M w/ AFib and hx of DVT on warfarin PTA presents with aspiration PNA.  Met with the patient and his son and Raymond Rangel (was on the telephone) at the bedside. She states that her father is receiving 2 hours from Raymond Rangel daily to assist her mother at home.  She states that her mother cannot lift him back to bed.  She states they are paying privately but it's taking a financial strain.  She states that they had applied for Medicaid once but is thinking about trying to apply again. She states she would like to speak with the case management staff.  Left a brochure with contact information with the son.  Daughter states that she and her mom would review the information. Encouraged them to call.  This is the patient's 1st admission in 6 months.  Informed Raymond Rangel that  the family had questions regarding discharge need.

## 2016-03-31 NOTE — Progress Notes (Signed)
Subjective: No acute events overnight.Patient more interactive on examination today. Not in any pain. Denies n.v or abdominal pain. Denies chest pain or shortness of breath. Says he feels like his normal self.   Objective:  Vital signs in last 24 hours: Vitals:   03/30/16 0803 03/30/16 1634 03/30/16 2201 03/31/16 0524  BP: 131/87 (!) 146/74 127/88 136/85  Pulse: 93 86 77 88  Resp: 18 19 19 18   Temp: 98.2 F (36.8 C) 98.9 F (37.2 C) 97.5 F (36.4 C) 98.5 F (36.9 C)  TempSrc: Axillary Oral Oral Oral  SpO2: 99% 98% 98% 98%  Weight:   123 lb 14.4 oz (56.2 kg)   Height:       Physical Exam  Constitutional: He appears well-developed and well-nourished.  HENT:  Head: Normocephalic and atraumatic.  Cardiovascular: Normal rate and regular rhythm.  Exam reveals no gallop and no friction rub.   No murmur heard. Respiratory: Effort normal and breath sounds normal. No respiratory distress. He has no wheezes.  GI: Soft. Bowel sounds are normal. He exhibits no distension. There is no tenderness.  Neurological:  Patient more interactive on exam. Is able to answer yes or no questions. Was not oriented to place but was oriented to person. Follows some commands. Opens eyes when asked.      Assessment/Plan:  Principal Problem:   Aspiration pneumonia (HCC) Active Problems:   CAD (coronary artery disease)   Diabetes mellitus, type 2 (HCC)   Vascular dementia with behavior disturbance   Protein-calorie malnutrition, severe (HCC)   Vitamin B 12 deficiency   Altered mental status   Hypernatremia   Sepsis (HCC)   Pressure injury of skin  Mr. [name] is an 82 year old gentleman with progressive dementia who is minimally verbal and unable to perform his activities of daily living at baseline who presents with decreased responsiveness and shortness of breath with a CT scan concerning for pneumonia and vitals consistent with sepsis.  In summary patient improving well form sepsis 2/2  aspiration pneumonia. Says he feels like he is normal.Na improved and responded to fluids appropriately/ Will continue with antibiotics and plan for discharge to home today or tomorrow with transition to oral Augmentin. Will need 7 total days of antibiotic coverage. Start date 12/20 end date 12/27  #Sepsis secondary to aspiration pneumonia Patient remains afebrile and hemodynamically stable. He is no longer tachycardic on exam. He is more interactive today. Based on the CT findings and presentation the most likely etiology of the patient's sepsis was secondary to aspiration pneumonia. We will continue to treat with coverage for anaerobes. -- d/c azithromycin, do not expect atypical's  -- Ampicillin sulbactam, we'll transition to by mouth Augmentin at the time of discharge. -- Follow-up blood culture- no growth to date -- Follow-up urine culture- no growth to date -- Follow-up Legionella urine antigen -- Follow-up strep pneumo urine antigen -- Trend lactic acid- normalized  # Hypernatremia Na responded appropriately to D5 W free water replacement. Most recent Na of 148. Will continue with IV fluids for now.  -- Goal sodium over the next 24 to normalize  -- D5W at 150 mL per hour until discharge    # Progressive dementia The patient has progressive dementia at baseline and is unable to perform his activities of daily living. According to his son he is minimally verbal at home. His dementia has been progressive over the previous 2 years. Also I suspect underlying swallowing dysfunction leading to potential aspiration pneumonia. The patient will benefit  from an inpatient evaluation to assess his swallowing function. -- Continue donepezil 10 mg BID -- Continue memantine 5 mg once daily -- SLP consult- dysphagia 1 diet  # Atrial fibrillation # DVT of the right saphenofemoral junction, right common femoral vein Patient has a history of atrial fibrillation and a DVT. Vascular ultrasound from  05/04/2015 showed a right saphenofemoral junction and right common femoral vein DVT. He is currently anticoagulated for his atrial fibrillation on warfarin. His INR at the time of presentation was 3.22. Most recent INR of 3.93. Goal INR 2-3.  -- Warfarin per pharmacy  # Hypertension Patient has a diagnosis of hypertension and takes amlodipine 5 mg once daily at home. We will hold amlodipine in the setting of sepsis. -- Hold home amlodipine.  DVT/PE prophylaxis: Anticoagulated with warfarin secondary to DVT FEN/GI: Nothing by mouth Code: Full Code    Dispo: Anticipated discharge today.   Thomasene LotJames Mica Releford, MD 03/31/2016, 8:09 AM Pager: [phone]

## 2016-03-31 NOTE — Discharge Instructions (Signed)

## 2016-04-01 LAB — BASIC METABOLIC PANEL
ANION GAP: 9 (ref 5–15)
BUN: 5 mg/dL — AB (ref 6–20)
CHLORIDE: 113 mmol/L — AB (ref 101–111)
CO2: 22 mmol/L (ref 22–32)
Calcium: 7.9 mg/dL — ABNORMAL LOW (ref 8.9–10.3)
Creatinine, Ser: 0.63 mg/dL (ref 0.61–1.24)
GFR calc Af Amer: >60 mL/min (ref >60)
Glucose, Bld: 90 mg/dL (ref 65–99)
POTASSIUM: 3.2 mmol/L — AB (ref 3.5–5.1)
Sodium: 144 mmol/L (ref 135–145)

## 2016-04-01 LAB — PROTIME-INR
INR: 2.74
Prothrombin Time: 29.6 seconds — ABNORMAL HIGH (ref 11.4–15.2)

## 2016-04-01 MED ORDER — AMOXICILLIN-POT CLAVULANATE 875-125 MG PO TABS: 1.0000 | ORAL_TABLET | Freq: Two times a day (BID) | ORAL | 0 refills | Status: AC

## 2016-04-01 MED ORDER — POTASSIUM CHLORIDE CRYS ER 20 MEQ PO TBCR
30.0000 meq | EXTENDED_RELEASE_TABLET | Freq: Once | ORAL | Status: AC
  Administered 2016-04-01: 30 meq via ORAL
  Filled 2016-04-01: qty 1

## 2016-04-01 MED ORDER — DIVALPROEX SODIUM 125 MG PO CSDR: 125.0000 mg | DELAYED_RELEASE_CAPSULE | Freq: Every day | ORAL | 3 refills | Status: AC

## 2016-04-01 MED ORDER — WARFARIN SODIUM 5 MG PO TABS: 5.0000 mg | ORAL_TABLET | Freq: Once | ORAL | Status: DC

## 2016-04-01 NOTE — Progress Notes (Signed)
ANTICOAGULATION CONSULT NOTE - Follow-Up Consult  Pharmacy Consult for Warfarin Indication: history of DVT/AFib  No Known Allergies  Patient Measurements: Height: 6' (182.9 cm) Weight: 122 lb (55.3 kg) IBW/kg (Calculated) : 77.6  Vital Signs: Temp: 98.4 F (36.9 C) (12/22 0453) Temp Source: Oral (12/22 0453) BP: 137/76 (12/22 0453) Pulse Rate: 50 (12/22 0453)  Labs:  Recent Labs  03/29/16 0955  03/30/16 0345  03/31/16 0631 03/31/16 1924 04/01/16 0722  HGB 13.1  --  10.6*  --   --   --   --   HCT 40.7  --  33.4*  --   --   --   --   PLT 250  --  197  --   --   --   --   LABPROT 33.6*  --  46.2*  --  36.3*  --  29.6*  INR 3.22  --  3.93  --  3.54  --  2.74  CREATININE 0.90  < > 0.72  < > 0.72 0.72 0.63  < > = values in this interval not displayed.  Estimated Creatinine Clearance: 55.7 mL/min (by C-G formula based on SCr of 0.63 mg/dL).   Medical History: Past Medical History:  Diagnosis Date  . Arthritis   . CAD (coronary artery disease)    stent 2005  . Dementia   . DJD (degenerative joint disease)   . DVT (deep venous thrombosis) (HCC)    L leg 04-2011  . H/O ETOH abuse   . Hypercholesteremia   . Infectious colitis 08/07/2012  . Irregular heart beat   . Pneumonia   . Recurrent UTI    UTI, MS changes admited 02-2013  . Seizures (HCC)   . Stroke [facility](HCC) 04-2011      Assessment: [age] yoM w/ AFib and hx of DVT on warfarin PTA presents with aspiration PNA. INR on admit was supratherapeutic at 3.22. INR is now 2.74, which is down significantly from yesterday. Hgb down to 10.6, plt wnl but down from admit. No S/Sx bleeding noted. Patient likely to go home today. Will re-start warfarin due to INR drop from yesterday.   PTA Warfarin Dose: 7.5mg  daily except 5mg  Sat/Sun  Goal of Therapy:  INR 2-3 Monitor platelets by anticoagulation protocol: Yes   Plan:  -Warfarin 5 mg x 1 tonight  -Monitor INR and s/sx of bleeding daily while admitted -If discharged today -  recommend warfarin 5 mg x 1 tonight instead of 7.5 and resuming normal regimen Saturday 12/23 with  close INR follow-up (Tuesday 12/26)  Bailey MechEmily Stewart, PharmD PGY-1 Pharmacy Resident Pager: [phone] 04/01/2016

## 2016-04-01 NOTE — Progress Notes (Signed)
Pt discharge instructions given, family and patient verbalized understanding. VSS. Denies pain. Pt to go home in private vehicle.

## 2016-04-01 NOTE — Care Management Important Message (Signed)
Important Message  Patient Details  Name: Raymond AntisWillie Lagace Jr. MRN: [id number] Date of Birth: 10/17/1931   Medicare Important Message Given:  Yes    Kobee Medlen 04/01/2016, 10:37 AM

## 2016-04-01 NOTE — Care Management Note (Signed)
Case Management Note  Patient Details  Name: Raymond Rangel. MRN: [id number] Date of Birth: 1932-03-07  Subjective/Objective:         CM following for progression and d/c planning.            Action/Plan: 04/01/2016 Met with pt son re d/c plan. Pt will d/c to home address:  [address], [place], [place] [address].  Phone number to call to arrange [facility] visit is: [phone] Pt is basically bedbound, family provides care with [facility] aide which they privately pay for most days, she is not available over the holidays. Family will transport pt home with help of son in law.  Pt has all DME as needed at home. No further needs identified.  Expected Discharge Date:      04/01/2016            Expected Discharge Plan:  [place]  In-House Referral:  NA  Discharge planning Services  CM Consult  Post Acute Care Choice:  Home Health Choice offered to:  Adult Children  DME Arranged:  N/A DME Agency:  NA  HH Arranged:  RN, PT, OT, Nurse's Aide, Social Work [employer] Agency:  [place]  Status of Service:  Completed, signed off  If discussed at [employer], dates discussed:    Additional Comments:  Adron Bene, RN 04/01/2016, 12:00 PM

## 2016-04-01 NOTE — Progress Notes (Signed)
Subjective: No acute events overnight.Patient more interactive on examination today. Not in any pain. Overall, patient looks much better and has improved greatly since admission. He is ready to go home today.   Objective:  Vital signs in last 24 hours: Vitals:   03/31/16 1829 03/31/16 2139 04/01/16 0453 04/01/16 0951  BP: 129/80 (!) 144/73 137/76 (!) 133/59  Pulse: 75 62 (!) 50 74  Resp: 18 16 16 16   Temp: 98 F (36.7 C) 98 F (36.7 C) 98.4 F (36.9 C) 98.5 F (36.9 C)  TempSrc: Oral Oral Oral Oral  SpO2: 100% 100% 98% 100%  Weight:  122 lb (55.3 kg)    Height:       Physical Exam  Constitutional: He appears well-developed and well-nourished.  HENT:  Head: Normocephalic and atraumatic.  Cardiovascular: Normal rate and regular rhythm.  Exam reveals no gallop and no friction rub.   No murmur heard. Respiratory: Effort normal and breath sounds normal. No respiratory distress. He has no wheezes.  GI: Soft. Bowel sounds are normal. He exhibits no distension. There is no tenderness.  Neurological:  Patient more interactive on exam. Is able to answer yes or no questions. Was not oriented to place but was oriented to person. Follows some commands. Opens eyes when asked.      Assessment/Plan:  Principal Problem:   Aspiration pneumonia (HCC) Active Problems:   CAD (coronary artery disease)   Diabetes mellitus, type 2 (HCC)   Vascular dementia with behavior disturbance   Protein-calorie malnutrition, severe (HCC)   Vitamin B 12 deficiency   Altered mental status   Hypernatremia   Sepsis (HCC)   Pressure injury of skin  Raymond Rangel is an 82 year old gentleman with progressive dementia who is minimally verbal and unable to perform his activities of daily living at baseline who presents with decreased responsiveness and shortness of breath with a CT scan concerning for pneumonia and vitals consistent with sepsis.  In summary patient improving well form sepsis 2/2 aspiration  pneumonia. Says he feels like he is normal.Na improved and responded to fluids appropriately and is currently WNL. Will continue with antibiotics and plan for discharge to home today with transition to oral Augmentin. Will need 7 total days of antibiotic coverage. Start date 12/20 end date 12/27  #Sepsis secondary to aspiration pneumonia Patient remains afebrile and hemodynamically stable. He is no longer tachycardic on exam. He is more interactive today. Based on the CT findings and presentation the most likely etiology of the patient's sepsis was secondary to aspiration pneumonia. We will continue to treat with coverage for anaerobes. -- Ampicillin sulbactam, we'll transition to by mouth Augmentin at the time of discharge. -- Follow-up blood culture- no growth to date -- Follow-up urine culture- no growth to date  # Hypernatremia, Resolved Na responded appropriately to D5 W free water replacement. Most recent Na of 144.    # Progressive dementia The patient has progressive dementia at baseline and is unable to perform his activities of daily living. According to his son he is minimally verbal at home. His dementia has been progressive over the previous 2 years. Also I suspect underlying swallowing dysfunction leading to potential aspiration pneumonia. The patient will benefit from an inpatient evaluation to assess his swallowing function. -- Continue donepezil 10 mg BID -- Continue memantine 5 mg once daily -- SLP consult- dysphagia 1 diet  # Atrial fibrillation # DVT of the right saphenofemoral junction, right common femoral vein Patient has a history of atrial  fibrillation and a DVT. Vascular ultrasound from 05/04/2015 showed a right saphenofemoral junction and right common femoral vein DVT. He is currently anticoagulated for his atrial fibrillation on warfarin. His INR at the time of presentation was 3.22. Most recent INR of 3.93. Goal INR 2-3.  -- Warfarin per pharmacy  #  Hypertension Patient has a diagnosis of hypertension and takes amlodipine 5 mg once daily at home. We will hold amlodipine in the setting of sepsis. -- Hold home amlodipine.  DVT/PE prophylaxis: Anticoagulated with warfarin secondary to DVT FEN/GI: Nothing by mouth Code: Full Code    Dispo: Anticipated discharge today.   Thomasene LotJames Loranzo Desha, MD 04/01/2016, 10:39 AM Pager: [phone]

## 2016-04-03 DIAGNOSIS — J690 Pneumonitis due to inhalation of food and vomit: Secondary | ICD-10-CM | POA: Diagnosis not present

## 2016-04-03 DIAGNOSIS — I2510 Atherosclerotic heart disease of native coronary artery without angina pectoris: Secondary | ICD-10-CM | POA: Diagnosis not present

## 2016-04-03 DIAGNOSIS — I482 Chronic atrial fibrillation: Secondary | ICD-10-CM | POA: Diagnosis not present

## 2016-04-03 DIAGNOSIS — Z7901 Long term (current) use of anticoagulants: Secondary | ICD-10-CM | POA: Diagnosis not present

## 2016-04-03 DIAGNOSIS — E119 Type 2 diabetes mellitus without complications: Secondary | ICD-10-CM | POA: Diagnosis not present

## 2016-04-03 LAB — CULTURE, BLOOD (ROUTINE X 2)
Culture: NO GROWTH
Culture: NO GROWTH

## 2016-04-05 DIAGNOSIS — Z7901 Long term (current) use of anticoagulants: Secondary | ICD-10-CM | POA: Diagnosis not present

## 2016-04-06 ENCOUNTER — Telehealth: Payer: Self-pay

## 2016-04-06 DIAGNOSIS — J690 Pneumonitis due to inhalation of food and vomit: Secondary | ICD-10-CM | POA: Diagnosis not present

## 2016-04-06 DIAGNOSIS — I482 Chronic atrial fibrillation: Secondary | ICD-10-CM | POA: Diagnosis not present

## 2016-04-06 DIAGNOSIS — I2510 Atherosclerotic heart disease of native coronary artery without angina pectoris: Secondary | ICD-10-CM | POA: Diagnosis not present

## 2016-04-06 DIAGNOSIS — Z7901 Long term (current) use of anticoagulants: Secondary | ICD-10-CM | POA: Diagnosis not present

## 2016-04-06 DIAGNOSIS — E119 Type 2 diabetes mellitus without complications: Secondary | ICD-10-CM | POA: Diagnosis not present

## 2016-04-06 NOTE — Telephone Encounter (Signed)
LVM advising patient to scheduled medicare wellness appointment

## 2016-04-08 DIAGNOSIS — I482 Chronic atrial fibrillation: Secondary | ICD-10-CM | POA: Diagnosis not present

## 2016-04-08 DIAGNOSIS — J690 Pneumonitis due to inhalation of food and vomit: Secondary | ICD-10-CM | POA: Diagnosis not present

## 2016-04-08 DIAGNOSIS — E119 Type 2 diabetes mellitus without complications: Secondary | ICD-10-CM | POA: Diagnosis not present

## 2016-04-08 DIAGNOSIS — I2510 Atherosclerotic heart disease of native coronary artery without angina pectoris: Secondary | ICD-10-CM | POA: Diagnosis not present

## 2016-04-08 DIAGNOSIS — Z7901 Long term (current) use of anticoagulants: Secondary | ICD-10-CM | POA: Diagnosis not present

## 2016-04-13 DIAGNOSIS — E119 Type 2 diabetes mellitus without complications: Secondary | ICD-10-CM | POA: Diagnosis not present

## 2016-04-13 DIAGNOSIS — I482 Chronic atrial fibrillation: Secondary | ICD-10-CM | POA: Diagnosis not present

## 2016-04-13 DIAGNOSIS — I2510 Atherosclerotic heart disease of native coronary artery without angina pectoris: Secondary | ICD-10-CM | POA: Diagnosis not present

## 2016-04-13 DIAGNOSIS — Z7901 Long term (current) use of anticoagulants: Secondary | ICD-10-CM | POA: Diagnosis not present

## 2016-04-13 DIAGNOSIS — J690 Pneumonitis due to inhalation of food and vomit: Secondary | ICD-10-CM | POA: Diagnosis not present

## 2016-04-14 DIAGNOSIS — I2510 Atherosclerotic heart disease of native coronary artery without angina pectoris: Secondary | ICD-10-CM | POA: Diagnosis not present

## 2016-04-14 DIAGNOSIS — Z7901 Long term (current) use of anticoagulants: Secondary | ICD-10-CM | POA: Diagnosis not present

## 2016-04-14 DIAGNOSIS — E119 Type 2 diabetes mellitus without complications: Secondary | ICD-10-CM | POA: Diagnosis not present

## 2016-04-14 DIAGNOSIS — I482 Chronic atrial fibrillation: Secondary | ICD-10-CM | POA: Diagnosis not present

## 2016-04-14 DIAGNOSIS — J690 Pneumonitis due to inhalation of food and vomit: Secondary | ICD-10-CM | POA: Diagnosis not present

## 2016-04-19 DIAGNOSIS — I2510 Atherosclerotic heart disease of native coronary artery without angina pectoris: Secondary | ICD-10-CM | POA: Diagnosis not present

## 2016-04-19 DIAGNOSIS — E119 Type 2 diabetes mellitus without complications: Secondary | ICD-10-CM | POA: Diagnosis not present

## 2016-04-19 DIAGNOSIS — Z7901 Long term (current) use of anticoagulants: Secondary | ICD-10-CM | POA: Diagnosis not present

## 2016-04-19 DIAGNOSIS — J690 Pneumonitis due to inhalation of food and vomit: Secondary | ICD-10-CM | POA: Diagnosis not present

## 2016-04-19 DIAGNOSIS — I482 Chronic atrial fibrillation: Secondary | ICD-10-CM | POA: Diagnosis not present

## 2016-04-22 DIAGNOSIS — I482 Chronic atrial fibrillation: Secondary | ICD-10-CM | POA: Diagnosis not present

## 2016-04-22 DIAGNOSIS — J690 Pneumonitis due to inhalation of food and vomit: Secondary | ICD-10-CM | POA: Diagnosis not present

## 2016-04-22 DIAGNOSIS — E119 Type 2 diabetes mellitus without complications: Secondary | ICD-10-CM | POA: Diagnosis not present

## 2016-04-22 DIAGNOSIS — Z7901 Long term (current) use of anticoagulants: Secondary | ICD-10-CM | POA: Diagnosis not present

## 2016-04-22 DIAGNOSIS — I2510 Atherosclerotic heart disease of native coronary artery without angina pectoris: Secondary | ICD-10-CM | POA: Diagnosis not present

## 2016-05-06 DIAGNOSIS — R509 Fever, unspecified: Secondary | ICD-10-CM | POA: Diagnosis not present

## 2016-05-06 DIAGNOSIS — I82401 Acute embolism and thrombosis of unspecified deep veins of right lower extremity: Secondary | ICD-10-CM | POA: Diagnosis not present

## 2016-05-06 DIAGNOSIS — I10 Essential (primary) hypertension: Secondary | ICD-10-CM | POA: Diagnosis not present

## 2016-05-06 DIAGNOSIS — R0689 Other abnormalities of breathing: Secondary | ICD-10-CM | POA: Diagnosis not present

## 2016-05-06 DIAGNOSIS — R05 Cough: Secondary | ICD-10-CM | POA: Diagnosis not present

## 2016-05-06 DIAGNOSIS — E039 Hypothyroidism, unspecified: Secondary | ICD-10-CM | POA: Diagnosis not present

## 2016-05-24 DIAGNOSIS — E538 Deficiency of other specified B group vitamins: Secondary | ICD-10-CM | POA: Diagnosis not present

## 2016-05-24 DIAGNOSIS — E784 Other hyperlipidemia: Secondary | ICD-10-CM | POA: Diagnosis not present

## 2016-05-24 DIAGNOSIS — Z7901 Long term (current) use of anticoagulants: Secondary | ICD-10-CM | POA: Diagnosis not present

## 2016-05-24 DIAGNOSIS — Z79899 Other long term (current) drug therapy: Secondary | ICD-10-CM | POA: Diagnosis not present

## 2016-05-24 DIAGNOSIS — E039 Hypothyroidism, unspecified: Secondary | ICD-10-CM | POA: Diagnosis not present

## 2016-06-16 DIAGNOSIS — R402421 Glasgow coma scale score 9-12, in the field [EMT or ambulance]: Secondary | ICD-10-CM | POA: Diagnosis not present

## 2016-06-29 DIAGNOSIS — Z7901 Long term (current) use of anticoagulants: Secondary | ICD-10-CM | POA: Diagnosis not present

## 2016-06-29 DIAGNOSIS — Z79899 Other long term (current) drug therapy: Secondary | ICD-10-CM | POA: Diagnosis not present

## 2016-08-09 DEATH — deceased
